# Patient Record
Sex: Female | Born: 1945 | Race: White | Hispanic: No | Marital: Married | Smoking: Former smoker
Health system: Southern US, Community
[De-identification: ages and names within clinical notes are randomized; demographics above are authoritative.]

## PROBLEM LIST (undated history)

## (undated) DIAGNOSIS — D649 Anemia, unspecified: Secondary | ICD-10-CM

## (undated) DIAGNOSIS — K219 Gastro-esophageal reflux disease without esophagitis: Secondary | ICD-10-CM

## (undated) DIAGNOSIS — I10 Essential (primary) hypertension: Secondary | ICD-10-CM

## (undated) DIAGNOSIS — F329 Major depressive disorder, single episode, unspecified: Secondary | ICD-10-CM

## (undated) DIAGNOSIS — M8580 Other specified disorders of bone density and structure, unspecified site: Secondary | ICD-10-CM

## (undated) DIAGNOSIS — E785 Hyperlipidemia, unspecified: Secondary | ICD-10-CM

## (undated) DIAGNOSIS — K449 Diaphragmatic hernia without obstruction or gangrene: Secondary | ICD-10-CM

## (undated) DIAGNOSIS — I2510 Atherosclerotic heart disease of native coronary artery without angina pectoris: Secondary | ICD-10-CM

## (undated) DIAGNOSIS — Q513 Bicornate uterus: Secondary | ICD-10-CM

## (undated) DIAGNOSIS — Z8719 Personal history of other diseases of the digestive system: Secondary | ICD-10-CM

## (undated) DIAGNOSIS — J342 Deviated nasal septum: Secondary | ICD-10-CM

## (undated) DIAGNOSIS — I341 Nonrheumatic mitral (valve) prolapse: Secondary | ICD-10-CM

## (undated) DIAGNOSIS — M1990 Unspecified osteoarthritis, unspecified site: Secondary | ICD-10-CM

## (undated) DIAGNOSIS — B029 Zoster without complications: Secondary | ICD-10-CM

## (undated) DIAGNOSIS — D219 Benign neoplasm of connective and other soft tissue, unspecified: Secondary | ICD-10-CM

## (undated) DIAGNOSIS — K635 Polyp of colon: Secondary | ICD-10-CM

## (undated) DIAGNOSIS — H33319 Horseshoe tear of retina without detachment, unspecified eye: Secondary | ICD-10-CM

## (undated) DIAGNOSIS — F32A Depression, unspecified: Secondary | ICD-10-CM

## (undated) DIAGNOSIS — IMO0002 Reserved for concepts with insufficient information to code with codable children: Secondary | ICD-10-CM

## (undated) DIAGNOSIS — R7303 Prediabetes: Secondary | ICD-10-CM

## (undated) DIAGNOSIS — F419 Anxiety disorder, unspecified: Secondary | ICD-10-CM

## (undated) DIAGNOSIS — G4730 Sleep apnea, unspecified: Secondary | ICD-10-CM

## (undated) HISTORY — DX: Unspecified osteoarthritis, unspecified site: M19.90

## (undated) HISTORY — DX: Major depressive disorder, single episode, unspecified: F32.9

## (undated) HISTORY — PX: COLONOSCOPY: SHX174

## (undated) HISTORY — DX: Diaphragmatic hernia without obstruction or gangrene: K44.9

## (undated) HISTORY — DX: Deviated nasal septum: J34.2

## (undated) HISTORY — PX: CATARACT EXTRACTION: SUR2

## (undated) HISTORY — PX: FOOT NEUROMA SURGERY: SHX646

## (undated) HISTORY — PX: OTHER SURGICAL HISTORY: SHX169

## (undated) HISTORY — PX: TUBAL LIGATION: SHX77

## (undated) HISTORY — DX: Bicornate uterus: Q51.3

## (undated) HISTORY — PX: LUMBAR FUSION: SHX111

## (undated) HISTORY — PX: FINGER SURGERY: SHX640

## (undated) HISTORY — DX: Anxiety disorder, unspecified: F41.9

## (undated) HISTORY — PX: UPPER GASTROINTESTINAL ENDOSCOPY: SHX188

## (undated) HISTORY — DX: Essential (primary) hypertension: I10

## (undated) HISTORY — DX: Depression, unspecified: F32.A

## (undated) HISTORY — DX: Nonrheumatic mitral (valve) prolapse: I34.1

## (undated) HISTORY — DX: Polyp of colon: K63.5

## (undated) HISTORY — DX: Zoster without complications: B02.9

## (undated) HISTORY — DX: Anemia, unspecified: D64.9

## (undated) HISTORY — DX: Hyperlipidemia, unspecified: E78.5

## (undated) HISTORY — PX: HYSTEROSCOPY: SHX211

## (undated) HISTORY — DX: Prediabetes: R73.03

## (undated) HISTORY — DX: Horseshoe tear of retina without detachment, unspecified eye: H33.319

## (undated) HISTORY — DX: Benign neoplasm of connective and other soft tissue, unspecified: D21.9

## (undated) HISTORY — DX: Gastro-esophageal reflux disease without esophagitis: K21.9

## (undated) HISTORY — DX: Other specified disorders of bone density and structure, unspecified site: M85.80

## (undated) HISTORY — DX: Reserved for concepts with insufficient information to code with codable children: IMO0002

## (undated) HISTORY — PX: LAPAROSCOPIC HYSTERECTOMY: SHX1926

---

## 1979-05-25 DIAGNOSIS — B029 Zoster without complications: Secondary | ICD-10-CM

## 1979-05-25 HISTORY — DX: Zoster without complications: B02.9

## 1998-08-03 ENCOUNTER — Other Ambulatory Visit: Admission: RE | Admit: 1998-08-03 | Discharge: 1998-08-03 | Payer: Self-pay | Admitting: *Deleted

## 1999-07-23 ENCOUNTER — Other Ambulatory Visit: Admission: RE | Admit: 1999-07-23 | Discharge: 1999-07-23 | Payer: Self-pay | Admitting: *Deleted

## 1999-09-11 ENCOUNTER — Other Ambulatory Visit: Admission: RE | Admit: 1999-09-11 | Discharge: 1999-09-11 | Payer: Self-pay | Admitting: Urology

## 2000-10-14 ENCOUNTER — Other Ambulatory Visit: Admission: RE | Admit: 2000-10-14 | Discharge: 2000-10-14 | Payer: Self-pay | Admitting: *Deleted

## 2001-02-12 ENCOUNTER — Other Ambulatory Visit: Admission: RE | Admit: 2001-02-12 | Discharge: 2001-02-12 | Payer: Self-pay | Admitting: Internal Medicine

## 2002-02-17 ENCOUNTER — Ambulatory Visit (HOSPITAL_COMMUNITY): Admission: RE | Admit: 2002-02-17 | Discharge: 2002-02-17 | Payer: Self-pay | Admitting: Internal Medicine

## 2002-07-21 ENCOUNTER — Other Ambulatory Visit: Admission: RE | Admit: 2002-07-21 | Discharge: 2002-07-21 | Payer: Self-pay | Admitting: Obstetrics and Gynecology

## 2003-07-27 ENCOUNTER — Other Ambulatory Visit: Admission: RE | Admit: 2003-07-27 | Discharge: 2003-07-27 | Payer: Self-pay | Admitting: Obstetrics and Gynecology

## 2003-09-22 ENCOUNTER — Inpatient Hospital Stay (HOSPITAL_COMMUNITY): Admission: RE | Admit: 2003-09-22 | Discharge: 2003-09-25 | Payer: Self-pay | Admitting: Neurosurgery

## 2003-09-24 DIAGNOSIS — H33319 Horseshoe tear of retina without detachment, unspecified eye: Secondary | ICD-10-CM

## 2003-09-24 HISTORY — DX: Horseshoe tear of retina without detachment, unspecified eye: H33.319

## 2004-02-22 ENCOUNTER — Encounter: Admission: RE | Admit: 2004-02-22 | Discharge: 2004-02-22 | Payer: Self-pay | Admitting: Internal Medicine

## 2004-10-17 ENCOUNTER — Other Ambulatory Visit: Admission: RE | Admit: 2004-10-17 | Discharge: 2004-10-17 | Payer: Self-pay | Admitting: Obstetrics and Gynecology

## 2007-02-10 ENCOUNTER — Encounter: Payer: Self-pay | Admitting: Obstetrics and Gynecology

## 2007-02-10 ENCOUNTER — Ambulatory Visit (HOSPITAL_BASED_OUTPATIENT_CLINIC_OR_DEPARTMENT_OTHER): Admission: RE | Admit: 2007-02-10 | Discharge: 2007-02-10 | Payer: Self-pay | Admitting: Obstetrics and Gynecology

## 2007-06-22 ENCOUNTER — Other Ambulatory Visit: Admission: RE | Admit: 2007-06-22 | Discharge: 2007-06-22 | Payer: Self-pay | Admitting: Gynecology

## 2007-12-23 ENCOUNTER — Ambulatory Visit: Payer: Self-pay | Admitting: Internal Medicine

## 2007-12-31 ENCOUNTER — Encounter: Payer: Self-pay | Admitting: Internal Medicine

## 2007-12-31 ENCOUNTER — Ambulatory Visit: Payer: Self-pay | Admitting: Internal Medicine

## 2008-08-16 ENCOUNTER — Other Ambulatory Visit: Admission: RE | Admit: 2008-08-16 | Discharge: 2008-08-16 | Payer: Self-pay | Admitting: Obstetrics and Gynecology

## 2008-08-24 ENCOUNTER — Emergency Department (HOSPITAL_COMMUNITY): Admission: EM | Admit: 2008-08-24 | Discharge: 2008-08-24 | Payer: Self-pay | Admitting: Emergency Medicine

## 2008-11-23 ENCOUNTER — Encounter: Admission: RE | Admit: 2008-11-23 | Discharge: 2008-11-23 | Payer: Self-pay | Admitting: Neurosurgery

## 2009-02-22 ENCOUNTER — Encounter (INDEPENDENT_AMBULATORY_CARE_PROVIDER_SITE_OTHER): Payer: Self-pay | Admitting: *Deleted

## 2009-04-11 ENCOUNTER — Encounter: Payer: Self-pay | Admitting: Obstetrics and Gynecology

## 2009-04-11 ENCOUNTER — Ambulatory Visit (HOSPITAL_COMMUNITY): Admission: RE | Admit: 2009-04-11 | Discharge: 2009-04-12 | Payer: Self-pay | Admitting: Obstetrics and Gynecology

## 2009-07-18 ENCOUNTER — Encounter: Admission: RE | Admit: 2009-07-18 | Discharge: 2009-07-18 | Payer: Self-pay | Admitting: Internal Medicine

## 2009-08-21 ENCOUNTER — Encounter (INDEPENDENT_AMBULATORY_CARE_PROVIDER_SITE_OTHER): Payer: Self-pay | Admitting: *Deleted

## 2009-08-30 ENCOUNTER — Ambulatory Visit: Payer: Self-pay | Admitting: Internal Medicine

## 2009-09-05 ENCOUNTER — Ambulatory Visit: Payer: Self-pay | Admitting: Internal Medicine

## 2009-09-06 ENCOUNTER — Encounter: Payer: Self-pay | Admitting: Internal Medicine

## 2009-11-30 DIAGNOSIS — F32A Depression, unspecified: Secondary | ICD-10-CM | POA: Insufficient documentation

## 2009-11-30 DIAGNOSIS — J45909 Unspecified asthma, uncomplicated: Secondary | ICD-10-CM | POA: Insufficient documentation

## 2009-11-30 DIAGNOSIS — R011 Cardiac murmur, unspecified: Secondary | ICD-10-CM | POA: Insufficient documentation

## 2009-11-30 DIAGNOSIS — J302 Other seasonal allergic rhinitis: Secondary | ICD-10-CM | POA: Insufficient documentation

## 2009-11-30 DIAGNOSIS — IMO0002 Reserved for concepts with insufficient information to code with codable children: Secondary | ICD-10-CM | POA: Insufficient documentation

## 2009-11-30 DIAGNOSIS — R7301 Impaired fasting glucose: Secondary | ICD-10-CM | POA: Insufficient documentation

## 2009-11-30 DIAGNOSIS — I10 Essential (primary) hypertension: Secondary | ICD-10-CM | POA: Insufficient documentation

## 2009-11-30 DIAGNOSIS — Z008 Encounter for other general examination: Secondary | ICD-10-CM | POA: Insufficient documentation

## 2009-11-30 DIAGNOSIS — K219 Gastro-esophageal reflux disease without esophagitis: Secondary | ICD-10-CM | POA: Insufficient documentation

## 2010-10-14 ENCOUNTER — Encounter: Payer: Self-pay | Admitting: Internal Medicine

## 2010-10-23 NOTE — Letter (Signed)
Summary: [facility] Instructions  [facility]  [address] [place], [place] [address]   Phone: [phone]  Fax: [phone]       Shelley Spencer    30-Mar-1946    MRN: [id number]       Procedure Day /Date: Tuesday 09/05/09     Arrival Time:  9:30 AM     Procedure Time: 10:30 AM     Location of Procedure:                    _X _  [facility] (4th Floor)  PREPARATION FOR COLONOSCOPY WITH MIRALAX  Starting 5 days prior to your procedure Thursday 08/31/09 do not eat nuts, seeds, popcorn, corn, beans, peas,  salads, or any raw vegetables.  Do not take any fiber supplements (e.g. Metamucil, Citrucel, and Benefiber). ____________________________________________________________________________________________________   THE DAY BEFORE YOUR PROCEDURE         DATE: 09/04/09  DAY:  Monday  1   Drink clear liquids the entire day-NO SOLID FOOD  2   Do not drink anything colored red or purple.  Avoid juices with pulp.  No orange juice.  3   Drink at least 64 oz. (8 glasses) of fluid/clear liquids during the day to prevent dehydration and help the prep work efficiently.  CLEAR LIQUIDS INCLUDE: Water Jello Ice Popsicles Tea (sugar ok, no milk/cream) Powdered fruit flavored drinks Coffee (sugar ok, no milk/cream) Gatorade Juice: apple, white grape, white cranberry  Lemonade Clear bullion, consomm, broth Carbonated beverages (any kind) Strained chicken noodle soup Hard Candy  4   Mix the entire bottle of Miralax with 64 oz. of Gatorade/Powerade in the morning and put in the refrigerator to chill.  5   At 3:00 pm take 2 Dulcolax/Bisacodyl tablets.  6   At 4:30 pm take one Reglan/Metoclopramide tablet.  7  Starting at 5:00 pm drink one 8 oz glass of the Miralax mixture every 15-20 minutes until you have finished drinking the entire 64 oz.  You should finish drinking prep around 7:30 or 8:00 pm.  8   If you are nauseated, you may take the 2nd  Reglan/Metoclopramide tablet at 6:30 pm.        9    At 8:00 pm take 2 more DULCOLAX/Bisacodyl tablets.     THE DAY OF YOUR PROCEDURE      DATE:  09/05/09  DAY: Tuesday  You may drink clear liquids until  8:30 AM  (2 HOURS BEFORE PROCEDURE).   MEDICATION INSTRUCTIONS  Unless otherwise instructed, you should take regular prescription medications with a small sip of water as early as possible the morning of your procedure.  Additional medication instructions: Hold Furosemide morning of procedure         OTHER INSTRUCTIONS  You will need a responsible adult at least 63 years of age to accompany you and drive you home.   This person must remain in the waiting room during your procedure.  Wear loose fitting clothing that is easily removed.  Leave jewelry and other valuables at home.  However, you may wish to bring a book to read or an iPod/MP3 player to listen to music as you wait for your procedure to start.  Remove all body piercing jewelry and leave at home.  Total time from sign-in until discharge is approximately 2-3 hours.  You should go home directly after your procedure and rest.  You can resume normal activities the day after your procedure.  The  day of your procedure you should not:   Drive   Make legal decisions   Operate machinery   Drink alcohol   Return to work  You will receive specific instructions about eating, activities and medications before you leave.   The above instructions have been reviewed and explained to me by   Ezra Sites RN  August 30, 2009 9:04 AM     I fully understand and can verbalize these instructions _____________________________ Date _______

## 2010-10-23 NOTE — Procedures (Signed)
Summary: Colonoscopy  Patient: Shelley Spencer Note: All result statuses are Final unless otherwise noted.  Tests: (1) Colonoscopy (COL)   COL Colonoscopy           DONE      [facility]     [address]     [place], [place]  [address]           COLONOSCOPY PROCEDURE REPORT           PATIENT:  [name], [name]  MR#:  [id number]     BIRTHDATE:  Jan 06, 1946, 63 yrs. old  GENDER:  female           ENDOSCOPIST:  Hedwig Morton. Juanda Chance, MD     Referred by:  [name], M.D.           PROCEDURE DATE:  09/05/2009     PROCEDURE:  Colonoscopy 32440     ASA CLASS:  Class I     INDICATIONS:  history of pre-cancerous (adenomatous) colon polyps,     history of hyperplastic polyps adenom. polyp in 2002,     hyperplastic polyp in 2005           MEDICATIONS:   Versed 7 mg, Fentanyl 62.5 mcg           DESCRIPTION OF PROCEDURE:   After the risks benefits and     alternatives of the procedure were thoroughly explained, informed     consent was obtained.  Digital rectal exam was performed and     revealed no abnormalities.   The LB [id number]-H180AL [id number] endoscope     was introduced through the anus and advanced to the cecum, which     was identified by both the appendix and ileocecal valve, without     limitations.  The quality of the prep was good, using MiraLax.     The instrument was then slowly withdrawn as the colon was fully     examined.     <<PROCEDUREIMAGES>>           FINDINGS:  A diminutive polyp was found in the descending colon.     2-3 mm polyps at 50 cm removed The polyp was removed using cold     biopsy forceps (see image4).  This was otherwise a normal     examination of the colon (see image5, image3, image2, and image1).     Retroflexion was not performed.  The scope was then withdrawn from     the patient and the procedure completed.           COMPLICATIONS:  None           ENDOSCOPIC IMPRESSION:     1) Diminutive polyp in the descending colon     2) Otherwise normal  examination     RECOMMENDATIONS:     1) Await pathology results     2) high fiber diet           REPEAT EXAM:  In 7 year(s) for.           ______________________________     Hedwig Morton. Juanda Chance, MD           CC:           n.     eSIGNED:   Hedwig Morton. Kingslee Mairena at 09/05/2009 11:02 AM           Elinor Dodge, [id number]  Note: An exclamation mark (!) indicates a result that was not dispersed into the flowsheet. Document Creation  Date: 09/05/2009 11:02 AM _______________________________________________________________________  (1) Order result status: Final Collection or observation date-time: 09/05/2009 10:54 Requested date-time:  Receipt date-time:  Reported date-time:  Referring Physician:   Ordering Physician: Lina Sar [phone]) Specimen Source:  Source: Launa Grill Order Number: [id number] Lab site:   Appended Document: Colonoscopy     Procedures Next Due Date:    Colonoscopy: 08/2016

## 2010-10-23 NOTE — Letter (Signed)
Summary: Recall Colonoscopy Letter  [facility]  [address] [place], [place] [address]   Phone: [phone]  Fax: [phone]      February 22, 2009 MRN: [id number]   [facility] [address] [place], [place]  [address]   Dear Shelley Spencer,   According to your medical record, it is time for you to schedule a Colonoscopy. The American Cancer Society recommends this procedure as a method to detect early colon cancer. Patients with a family history of colon cancer, or a personal history of colon polyps or inflammatory bowel disease are at increased risk.  This letter has beeen generated based on the recommendations made at the time of your procedure. If you feel that in your particular situation this may no longer apply, please contact our office.  Please call our office at [phone] to schedule this appointment or to update your records at your earliest convenience.  Thank you for cooperating with [place] to provide you with the very best care possible.   Sincerely,  Hedwig Morton. Juanda Chance, M.D.  [facility] [phone]

## 2010-10-23 NOTE — Letter (Signed)
Summary: Patient Notice- Polyp Results  [facility]  [address] [place], [place] [address]   Phone: [phone]  Fax: [phone]        September 06, 2009 MRN: [id number]    [facility] [address] [place], [place]  [address]    Dear [name],  I am pleased to inform you that the colon polyp(s) removed during your recent colonoscopy was (were) found to be benign (no cancer detected) upon pathologic examination.The polyp consisted of a normal ( not precancerous) tissue  I recommend you have a repeat colonoscopy examination in 7_ years to look for recurrent polyps, as having colon polyps increases your risk for having recurrent polyps or even colon cancer in the future.  Should you develop new or worsening symptoms of abdominal pain, bowel habit changes or bleeding from the rectum or bowels, please schedule an evaluation with either your primary care physician or with me.  Additional information/recommendations:  _x_ No further action with gastroenterology is needed at this time. Please      follow-up with your primary care physician for your other healthcare      needs.  __ Please call [phone] to schedule a return visit to review your      situation.  __ Please keep your follow-up visit as already scheduled.  __ Continue treatment plan as outlined the day of your exam.  Please call us if you are having persistent problems or have questions about your condition that have not been fully answered at this time.  Sincerely,  Hart Carwin MD  This letter has been electronically signed by your physician.  Appended Document: Patient Notice- Polyp Results letter mailed 12.17.10

## 2010-10-23 NOTE — Miscellaneous (Signed)
Summary: [facility]  Clinical Lists Changes  Medications: Added new medication of MIRALAX   POWD (POLYETHYLENE GLYCOL 3350) As per prep  instructions. - Signed Added new medication of DULCOLAX 5 MG  TBEC (BISACODYL) Day before procedure take 2 at 3pm and 2 at 8pm. - Signed Added new medication of REGLAN 10 MG  TABS (METOCLOPRAMIDE HCL) As per prep instructions. - Signed Rx of MIRALAX   POWD (POLYETHYLENE GLYCOL 3350) As per prep  instructions.;  #255gm x 0;  Signed;  Entered by: Ezra Sites RN;  Authorized by: Hart Carwin MD;  Method used: Electronically to [facility] [address] [place], [place]  [address], Ph: [phone] or [phone], Fax: [phone] Rx of DULCOLAX 5 MG  TBEC (BISACODYL) Day before procedure take 2 at 3pm and 2 at 8pm.;  #4 x 0;  Signed;  Entered by: Ezra Sites RN;  Authorized by: Hart Carwin MD;  Method used: Electronically to [facility] [address] [place], [place]  [address], Ph: [phone] or [phone], Fax: [phone] Rx of REGLAN 10 MG  TABS (METOCLOPRAMIDE HCL) As per prep instructions.;  #2 x 0;  Signed;  Entered by: Ezra Sites RN;  Authorized by: Hart Carwin MD;  Method used: Electronically to [facility] [address], [place], [place]  [address], Ph: [phone] or [phone], Fax: [phone] Observations: Added new observation of NKA: T (08/30/2009 8:28)    Prescriptions: REGLAN 10 MG  TABS (METOCLOPRAMIDE HCL) As per prep instructions.  #2 x 0   Entered by:   Ezra Sites RN   Authorized by:   Hart Carwin MD   Signed by:   Ezra Sites RN on 08/30/2009   Method used:   Electronically to        [facility] (retail)       [address]       [place], [place]  [address]       Ph: [phone] or [phone]       Fax: [phone]   RxID:   [id number] DULCOLAX 5 MG  TBEC (BISACODYL) Day before procedure take 2 at 3pm and 2 at 8pm.  #4 x 0   Entered by:   Ezra Sites RN   Authorized by:   Hart Carwin MD   Signed  by:   Ezra Sites RN on 08/30/2009   Method used:   Electronically to        [facility] (retail)       [address]       [place], [place]  [address]       Ph: [phone] or [phone]       Fax: [phone]   RxID:   [id number] MIRALAX   POWD (POLYETHYLENE GLYCOL 3350) As per prep  instructions.  #255gm x 0   Entered by:   Ezra Sites RN   Authorized by:   Hart Carwin MD   Signed by:   Ezra Sites RN on 08/30/2009   Method used:   Electronically to        [facility] (retail)       [address]       [place], [place]  [address]       Ph: [phone] or [phone]       Fax: [phone]   RxID:   [id number]

## 2010-12-30 LAB — CBC
HCT: 34.8 % — ABNORMAL LOW (ref 36.0–46.0)
HCT: 38.9 % (ref 36.0–46.0)
Hemoglobin: 11.3 g/dL — ABNORMAL LOW (ref 12.0–15.0)
Hemoglobin: 12.8 g/dL (ref 12.0–15.0)
MCHC: 32.6 g/dL (ref 30.0–36.0)
MCHC: 32.9 g/dL (ref 30.0–36.0)
MCV: 80.6 fL (ref 78.0–100.0)
MCV: 81.6 fL (ref 78.0–100.0)
Platelets: 257 10*3/uL (ref 150–400)
Platelets: 345 10*3/uL (ref 150–400)
RBC: 4.26 MIL/uL (ref 3.87–5.11)
RBC: 4.82 MIL/uL (ref 3.87–5.11)
RDW: 14.1 % (ref 11.5–15.5)
RDW: 14.3 % (ref 11.5–15.5)
WBC: 16.5 10*3/uL — ABNORMAL HIGH (ref 4.0–10.5)
WBC: 9.6 10*3/uL (ref 4.0–10.5)

## 2010-12-30 LAB — COMPREHENSIVE METABOLIC PANEL
ALT: 22 U/L (ref 0–35)
AST: 17 U/L (ref 0–37)
Albumin: 3.3 g/dL — ABNORMAL LOW (ref 3.5–5.2)
Alkaline Phosphatase: 123 U/L — ABNORMAL HIGH (ref 39–117)
BUN: 8 mg/dL (ref 6–23)
CO2: 28 mEq/L (ref 19–32)
Calcium: 8.9 mg/dL (ref 8.4–10.5)
Chloride: 106 mEq/L (ref 96–112)
Creatinine, Ser: 0.82 mg/dL (ref 0.4–1.2)
GFR calc Af Amer: >60 mL/min (ref >60)
GFR calc non Af Amer: >60 mL/min (ref >60)
Glucose, Bld: 109 mg/dL — ABNORMAL HIGH (ref 70–99)
Potassium: 3.6 mEq/L (ref 3.5–5.1)
Sodium: 140 mEq/L (ref 135–145)
Total Bilirubin: 0.5 mg/dL (ref 0.3–1.2)
Total Protein: 6.9 g/dL (ref 6.0–8.3)

## 2010-12-30 LAB — TYPE AND SCREEN
ABO/RH(D): B POS
Antibody Screen: NEGATIVE

## 2010-12-30 LAB — ABO/RH: ABO/RH(D): B POS

## 2011-02-05 ENCOUNTER — Other Ambulatory Visit: Payer: Self-pay | Admitting: Dermatology

## 2011-02-05 NOTE — Assessment & Plan Note (Signed)
[facility]                         GASTROENTEROLOGY OFFICE NOTE   Shelley, Spencer                        MRN:          [id number]  DATE:12/23/2007                            DOB:          1946/06/23    OFFICE CONSULTATION   PROBLEM:  1. Heartburn, indigestion.  2. Hematemesis of a quarter-sized clot.   HISTORY:  Shelley Spencer is a pleasant, 65 year old, white female, who had  undergone two previous colonoscopies with Dr. Juanda Chance for colon polyps,  the last was done approximately three years ago and was positive for one  small polyp in the rectum, which was hyperplastic.  She has not had any  previous upper GI evaluation or endoscopy.  She says that she has been  on Protonix chronically over the past couple of years per Dr. Waynard Edwards and  that this had worked well for the most part.  She says occasionally she  would have some heartburn but felt that was due to dietary indiscretion.  Approximately one week ago, she ate a donut and then developed bad  indigestion shortly thereafter.  She says he was driving in her car and  burped and actually burped up a blood clot about the size of a quarter.  Since that time, she has had some mid chest discomfort that has been  fairly constant and radiating to her back.  She says initially she was  somewhat afraid to eat and that her appetite has been somewhat off over  the past week.  She has not had any nausea or vomiting, denies any  dysphagia or odynophagia, but says she has a fairly high tolerance to  pain.  She has not had any further episodes of the hematemesis, has  noticed that her stools maybe are a little bit darker, but no overt  blood or melena and denies any abdominal pain.  She had taken a course  of Augmentin about three weeks ago and had also just prior to that  episode taken about a two week course of Celebrex for arthritis  symptoms.  She saw Dr. Waynard Edwards, the Protonix was increased to twice a  day, and  she is referred here.   CURRENT MEDICATIONS:  1. Furosemide 20 mg daily.  2. KCL 20 mEq daily.  3. Trazodone 150 q.h.s.  4. Paxil 40 daily.  5. Pravachol 40 daily.  6. Protonix 40 b.i.d.  7. Advair; she is not certain of the milligrams twice daily.  8. Flonase nasal spray.  9. Zyrtec 10 mg daily.  10.Avapro 150 daily.  11.Vivelle patch two times weekly.  12.Prometrium 100 mg daily.   ALLERGIES:  No known drug allergies.  INTOLERANT TO ERYTHROMYCIN and  AUGMENTIN with GI upset.   PAST MEDICAL HISTORY:  1. Bilateral tubal ligation.  2. Hyperlipidemia.  3. Hypertension.  4. Anxiety.  5. Depression.  6. Chronic allergy sinus symptoms.   FAMILY HISTORY:  Parents and brother with heart disease, parents and  brothers also with a history of alcoholism, there is no family history  of colon cancer or polyps.   SOCIAL HISTORY:  The patient is married,  she is a Runner, broadcasting/film/video, she is a  nonsmoker, drinks alcohol occasionally socially.   REVIEW OF SYSTEMS:  Pertinent for intermittent lower extremity edema,  arthritic symptoms, fatigue, occasional shortness of breath with  exertion, she is still having menstrual periods, says that they have  been a bit heavier over the past few months, and generally last four to  five days.   PHYSICAL EXAMINATION:  GENERAL:  A well-developed white female in no  acute distress.  VITAL SIGNS:  Height is 5 foot 2, weight is 190.8, blood pressure  106/68, pulse is 86.  HEENT:  Nontraumatic, normocephalic, EOMI, PERRLA, sclerae are  anicteric.  Examination of the oropharynx is negative.  NECK:  Supple and without nodes.  CARDIOVASCULAR:  Regular rate and rhythm with S1 and S2, no murmur, rub,  or gallop.  PULMONARY:  Clear to A&P.  She has no chest wall tenderness.  ABDOMEN:  Soft and nontender, there is no palpable mass or  hepatosplenomegaly.  RECTAL EXAM:  Brown stool that was trace hemoccult positive.   LABORATORY STUDIES:  CBC done in Dr. Laurey Morale  office shows a hemoglobin  of 12.1, hematocrit of 35.4, MCV of 78.3, WBC of 7.9, and platelets 275.   IMPRESSION:  70. A 64 year old, white female with one week history of subxiphoid      pain and a single episode of hematemesis.  Symptoms are most      consistent with an acute esophagitis, the etiology of which is      unclear with no obvious risk factors, rule out nonsteroidal      antiinflammatory drug-induced (NSAID) esophageal ulceration or      peptic ulcer disease.  2. History of colon polyps.  3. Hypertension.   PLAN:  1. Continue Protonix 40 mg p.o. b.i.d.  2. Add Carafate Slurry 1 g a.c., in between meals, and at h.s.  3. Stop Celebrex.  4. Schedule an upper endoscopy as soon as possible with Dr. Juanda Chance.  5. Check a CBC and iron studies today.      Shelley Gip, PA-C  Electronically Signed      Shelley Spencer. Shelley Goodell, MD  Electronically Signed   AE/MedQ  DD: 12/23/2007  DT: 12/23/2007  Job #: [id number]   cc:   Hedwig Morton. Juanda Chance, MD  Redge Gainer Perini, M.D.

## 2011-02-05 NOTE — Op Note (Signed)
Shelley Spencer, Shelley Spencer                 ACCOUNT NO.:  [id number]   MEDICAL RECORD NO.:  [id number]          PATIENT TYPE:  AMB   LOCATION:  DAY                          FACILITY:  [facility]   PHYSICIAN:  Cynthia P. Romine, M.D.DATE OF BIRTH:  September 17, 1946   DATE OF PROCEDURE:  04/11/2009  DATE OF DISCHARGE:                               OPERATIVE REPORT   PREOPERATIVE DIAGNOSIS:  Back and leg pain, multiple uterine fibroids.   POSTOPERATIVE DIAGNOSIS:  Back and leg pain, multiple uterine fibroids.   PATHOLOGY:  Pending.   PROCEDURE:  Robotic total laparoscopic hysterectomy with bilateral  salpingo-oophorectomy.   SURGEON:  Cynthia P. Romine, M.D.   ASSISTANT:  Dr. Leda Quail.   ANESTHESIA:  General endotracheal.   ESTIMATED BLOOD LOSS:  100 mL.   COMPLICATIONS:  None.   PROCEDURE:  The patient was taken to the operating room and after the  induction of adequate general endotracheal anesthesia, was placed in the  dorsal lithotomy position and prepped and draped in the usual fashion.  A posterior weighted and anterior Sims retractor were placed.  The  cervix was grasped on its anterior lip with a single-tooth tenaculum.  Double cervical ostia could be seen.  The one on the patient's right was  very stenotic.  The one on the patient's left was more patent and did  dilate up to a #23 Pratt.  The sound was inserted and sounded to 11 cm.  A 10-cm RUMI manipulator with a large KOH ring was placed around the  cervix.  The balloon was inflated in the uterus to 5 mL.  The vaginal  occluder was inflated to 50 mL.  A Foley catheter was inserted for clear  yellow urine.  The patient has a history of asthma and preoperatively  had been having some difficulties with her breathing.  She was given a  preoperative respiratory treatment and during Trendelenburg, she was  found to be quite intolerant of the Trendelenburg from a respiratory  standpoint.  Therefore, she was put in less steep  Trendelenburg than is  typical for a robotic hysterectomy.  The supraumbilical incision was  made after infiltrating with 0.25% Marcaine, carried down to the fascia  with blunt dissection.  The fascia was elevated with Kochers, opened  with scissors, and the underlying peritoneum was entered atraumatically.  A pursestring suture was placed around the fascia.  A Hasson trocar was  inserted.  Pneumoperitoneum was created and the pelvis was inspected.  The 2 uterine horns were obvious and were fused in the midline  partially.  Externally the uterus looked like a deep arcuate pattern.  The tubes and ovaries on each side appeared normal, although the tubes  were both slightly thickened and edematous-looking.  They were mobile,  as were the ovaries.  After establishing pneumoperitoneum, the abdomen  was insufflated.  The sites for the accessory trocars were marked with a  marking pen and infiltrated with 0.25% Marcaine and two 8-mm trocars  were placed in the patient's left upper quadrant and in the right upper  quadrant an 8-mm and a 10-mm  trocar were placed under direct  visualization.  The patient was put in as much Trendelenburg as she  would tolerate.  The robot was brought in and docked from the side to  allow vaginal access later in the case.  A PK Gyrus and monopolar  scissors and a cobra grasper were used.  The surgeon then proceeded to  the robot.  The ureter on the right was identified and could be seen  peristalsing.  On the left, an attempt was made to find the ureter and  there was significant fat underneath the peritoneum and some bowel  adhesions and the ureter could not be identified on the patient's left.  On the right, the round ligament was coagulated with the Gyrus and then  cut.  The infundibulopelvic ligament was cauterized, hugging the ovary  closely to stay as far away from the ureter as possible.  The  infundibulopelvic ligament was cauterized and then cut sequentially  with  scissors.  The anterior and posterior leaves of the broad ligament were  taken down sharply.  On the opposite side, the round ligament was  coagulated and cut.  The infundibulopelvic ligament, again, was  cauterized, hugging the ovary carefully to stay as far away from the  presumed position of the ureter as possible.  The anterior and posterior  leaves of the broad ligament were opened, paying particular attention to  open the posterior leaf of the broad ligament widely throughout ureter  laterally.  The bladder was taken down off the cervix using sharp and  blunt dissection.  The uterine arteries on each side were identified,  cauterized and cut.  The KOH ring was elevated vaginally as much as  possible when doing this.  After cutting the uterine arteries, the  vagina was then entered anteriorly with the monopolar scissors and an  incision was made into the vagina circumferentially around the KOH ring  with the monopolar scissors.  The specimen was able to be delivered  vaginally without morcellation.  The vaginal occluder was placed in the  vagina to maintain the pneumoperitoneum.  The cuff was then closed with  4 sutures of figure-of-eight 0 Vicryl.  The pelvis was inspected.  The  pedicles were free of bleeding, as was the cuff.  It should be noted  that after completely excising the specimen from the vagina, it was  noted that one of the blades of the monopolar scissors had broken off  the instrument.  The instrument was taken out and switched out.  The  blade that had been broken off was found after the specimen was removed.  It was lying on the posterior vaginal cuff.  This was easily grasped and  brought out through the assistance port.  After closure of the vagina  and irrigation, the right ureter was again seen peristalsing.  The left  ureter, again, could not be identified.  The pelvis was hemostatic.  The  instruments were removed under direct visualization after the  robot was  undocked.  Pneumoperitoneum was allowed to escape.  The sleeves were  removed.  The fascial stitch at the supraumbilical incision was tied.  The fascia at the assistance port was grasped with a Kocher and closed  with a single suture of 0 Vicryl.  The skin and all the incisions were  closed subcuticularly with 4-0 Vicryl.  Benzoin and Steri-Strips and a  dressing were applied.  The procedure was terminated.  The patient  tolerated it well and went in  satisfactory condition to post anesthesia  recovery.      Cynthia P. Romine, M.D.  Electronically Signed     CPR/MEDQ  D:  04/11/2009  T:  04/12/2009  Job:  [id number]

## 2011-02-08 NOTE — Op Note (Signed)
NAMESALVATORE, [name]                 ACCOUNT NO.:  [id number]   MEDICAL RECORD NO.:  [id number]          PATIENT TYPE:  AMB   LOCATION:  [facility]                         FACILITY:  [facility]   PHYSICIAN:  Cynthia P. 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  The polyp  and the myoma were seen on the right-hand uterus on sonohysterogram.   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  The polyp  and the myoma were seen on the right-hand uterus on sonohysterogram,  pathology pending.   PROCEDURE:  1. Hysteroscopy and hysteroscopic resection of polyps and myoma in the      right uterine corpus.  2. Hysteroscopy and removal of polyp from left corpus.  3. Dilatation and curettage of both sides.   SURGEON:  Meredeth Ide, M.D.   ANESTHESIA:  General by LMA.   ESTIMATED BLOOD LOSS:  Minimal.   COMPLICATIONS:  None.   PROCEDURE:  The patient was taken to the operating room and after the  induction of adequate general anesthesia, she was placed in the dorsal  lithotomy position and prepped and draped in the usual fashion.  The  bladder was drained with an in-and-out catheterization.  A posterior  weighted and anterior Sims retractor were placed.  The cervix was  grasped on its anterior lip with a single-tooth tenaculum.  There were 2  openings noted, right and left; the right one was more prominent; it was  dilated to a #31 Pratt and the operative hysteroscope was introduced.  Several endometrial polyps and a myoma were noted and these were  resected with the resectoscope.  Drawing back with the resectoscope, an  attempt was made to see if there was a connection in the lower  part of  the cervix from one cervix to the other, but there was not.  The second  os the left was then also dilated to a #31 Pratt and the hysteroscope  introduced.  Hysteroscopy revealed it to be generally a clean cavity;  there was 1 small polyp that was removed with the hysteroscope was a  single loop and then the hysteroscope was withdrawn from that side as  well.  Sharp curettage was carried out on the right first and then was  carried out on the left.  After a curettage was done on the right, a  Pratt dilator was placed into the cavity to be certain that when the  operator curetted the left cavity that it was indeed separate and  indistinct from the cervix into which I had already been.  Upon  curetting the left cavity, the curette could be felt bumping up against  the dilator that was in the corpus on the right, therefore establishing  again, as was seen on sonohysterogram, a connection near the fundus  between the 2 uterine sides.  After completing the curettage  on the left, the instruments were withdrawn from the uterus, the  tenaculum was taken off the cervix, the retractors were removed and the  procedure was terminated.  The patient tolerated it well and went in  satisfactory condition to Post Anesthesia Recovery.      Cynthia P. Romine, M.D.  Electronically Signed     CPR/MEDQ  D:  02/10/2007  T:  02/10/2007  Job:  [id number]

## 2011-03-12 ENCOUNTER — Ambulatory Visit (HOSPITAL_BASED_OUTPATIENT_CLINIC_OR_DEPARTMENT_OTHER)
Admission: RE | Admit: 2011-03-12 | Discharge: 2011-03-12 | Disposition: A | Discharge status: Home / Self Care | Payer: BC Managed Care – PPO | Source: Ambulatory Visit | Attending: Orthopedic Surgery | Admitting: Orthopedic Surgery

## 2011-03-12 DIAGNOSIS — M6740 Ganglion, unspecified site: Secondary | ICD-10-CM | POA: Insufficient documentation

## 2011-03-12 DIAGNOSIS — M19049 Primary osteoarthritis, unspecified hand: Secondary | ICD-10-CM | POA: Insufficient documentation

## 2011-03-15 NOTE — Op Note (Signed)
Shelley Spencer, Shelley Spencer                ACCOUNT NO.:  [id number]  MEDICAL RECORD NO.:  [id number]  LOCATION:                                 FACILITY:  PHYSICIAN:  Shelley Fitch. Merial Moritz, M.D.      DATE OF BIRTH:  DATE OF PROCEDURE:  03/12/2011 DATE OF DISCHARGE:                              OPERATIVE REPORT   PREOPERATIVE DIAGNOSES:  Mucoid cyst, dorsal ulnar aspect right long finger distal interphalangeal joint status post 2 prior debridements, one by patient and one by her attending dermatologist with recurrence. Preoperative x-rays revealed advanced degenerative arthritis of the right long finger distal interphalangeal joint with large marginal osteophytes and probable small loose bodies.  OPERATION: 1. Debridement of mucoid cyst, right long finger. 2. Distal interphalangeal joint arthrotomy with removal of marginal     osteophytes on middle phalangeal head and base of distal phalanx     with synovectomy and irrigation.  OPERATING SURGEON:  Shelley Fitch. Jauna Raczynski, MD  ASSISTANT:  Annye Rusk, PA-C  ANESTHESIA:  Lidocaine 2% metacarpal head level block.  This was performed as a minor operating room procedure.  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 Bufford Buttner, attending dermatologist for evaluation and management of recurrent mucoid cyst.  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  She had developed a mucoid cyst on the dorsal ulnar aspect of right long finger and had tried to debride it at home without success.  She subsequently had a second debridement of biopsy by Dr. Leonie Man.  She had a recurrence and was referred for an extremity orthopedic consult.  Clinical examination revealed a mature mucoid cyst on the dorsal ulnar aspect the DIP joint.  Plain x-rays documented bone-on-bone arthropathy with large marginal osteophytes and likely  loose bodies.  We advised Shelley Spencer to undergo DIP joint arthrotomy, cyst excision, osteophyte debridement, and loose body debridement.  Questions regarding the anticipated procedure were invited and answered in detail.  PROCEDURE:  Shelley Spencer was brought to room 2 at the [facility] and placed in supine position on operating table.  After informed consent and alcohol and Betadine prep, 2% lidocaine was infiltrated in the metacarpal head level to obtain a digital block.  After few moments, excellent anesthesia was achieved.  The right hand and arm were then prepped with Betadine soap and solution, sterilely draped.  A digital tourniquet was utilized after exsanguination of the long finger with a gauze wrap.  A 1/2-inch Penrose drain was placed at the base of the proximal phalanx.  Procedure commenced with a curvilinear incision incorporating the cyst with an elliptical excision of full-thickness skin and cyst.  We then exposed the extensor mechanism and the capsule on the ulnar aspect of the DIP joint.  A triangular portion of the capsule was resected between the terminal extensor tendon slip and the ulnar collateral ligament. The marginal osteophytes at the base of the distal phalanx and middle phalangeal head were removed with a micro rongeur and a micro curette. Synovectomy of the dorsal aspect of the joint was accomplished.  The joint was then  irrigated thoroughly until the effluent was clear.  The wound was then inspected for bleeding points followed by repair of the skin with a rotation flap utilizing trauma sutures to advance the radial side skin distally to close the defect created by cyst excision. Multiple interrupted sutures of 5-0 nylon were utilized.  The wound was then dressed with Xeroflo, tourniquet released, and hemostasis achieved by direct pressure.  Finger was dressed with sterile gauze and Coban.  For aftercare, Shelley Spencer will remove her dressing in  4 days.  She is provided prescriptions for Vicodin 5 mg one p.o. q.4-6 h p.r.n. pain 20 tablets without refill, also doxycycline 100 mg 1 p.o. b.i.d. with 8 ounces of water x4 days as prophylactic antibiotic.  She is encouraged to avoid direct sunlight and/or use a hat and some screening she is in the son for any period longer than 30 minutes.     Shelley Spencer, M.D.     RVS/MEDQ  D:  03/12/2011  T:  03/13/2011  Job:  [id number]  cc:   Loraine Leriche A. Perini, M.D. Bufford Buttner, MD  Electronically Signed by Josephine Igo M.D. on 03/15/2011 08:29:55 AM

## 2011-06-28 LAB — URINALYSIS, ROUTINE W REFLEX MICROSCOPIC
Bilirubin Urine: NEGATIVE
Glucose, UA: NEGATIVE mg/dL
Ketones, ur: NEGATIVE mg/dL
Nitrite: NEGATIVE
Protein, ur: NEGATIVE mg/dL
Specific Gravity, Urine: 1.017 (ref 1.005–1.030)
Urobilinogen, UA: 1 mg/dL (ref 0.0–1.0)
pH: 6.5 (ref 5.0–8.0)

## 2011-06-28 LAB — DIFFERENTIAL
Basophils Absolute: 0.1 10*3/uL (ref 0.0–0.1)
Basophils Relative: 1 % (ref 0–1)
Eosinophils Absolute: 0.2 10*3/uL (ref 0.0–0.7)
Eosinophils Relative: 3 % (ref 0–5)
Lymphocytes Relative: 47 % — ABNORMAL HIGH (ref 12–46)
Lymphs Abs: 3.7 10*3/uL (ref 0.7–4.0)
Monocytes Absolute: 0.6 10*3/uL (ref 0.1–1.0)
Monocytes Relative: 8 % (ref 3–12)
Neutro Abs: 3.2 10*3/uL (ref 1.7–7.7)
Neutrophils Relative %: 41 % — ABNORMAL LOW (ref 43–77)

## 2011-06-28 LAB — CBC
HCT: 40.5 % (ref 36.0–46.0)
Hemoglobin: 13.3 g/dL (ref 12.0–15.0)
MCHC: 32.7 g/dL (ref 30.0–36.0)
MCV: 81.3 fL (ref 78.0–100.0)
Platelets: 304 10*3/uL (ref 150–400)
RBC: 4.98 MIL/uL (ref 3.87–5.11)
RDW: 14 % (ref 11.5–15.5)
WBC: 7.9 10*3/uL (ref 4.0–10.5)

## 2011-06-28 LAB — POCT I-STAT, CHEM 8
BUN: 20 mg/dL (ref 6–23)
Calcium, Ion: 1.21 mmol/L (ref 1.12–1.32)
Chloride: 105 mEq/L (ref 96–112)
Creatinine, Ser: 1.2 mg/dL (ref 0.4–1.2)
Glucose, Bld: 89 mg/dL (ref 70–99)
HCT: 40 % (ref 36.0–46.0)
Hemoglobin: 13.6 g/dL (ref 12.0–15.0)
Potassium: 3.6 mEq/L (ref 3.5–5.1)
Sodium: 142 mEq/L (ref 135–145)
TCO2: 27 mmol/L (ref 0–100)

## 2011-06-28 LAB — POCT CARDIAC MARKERS
CKMB, poc: 1.1 ng/mL (ref 1.0–8.0)
Myoglobin, poc: 43 ng/mL (ref 12–200)
Troponin i, poc: <0.05 ng/mL (ref 0.00–0.09)

## 2011-06-28 LAB — URINE MICROSCOPIC-ADD ON

## 2011-06-28 LAB — D-DIMER, QUANTITATIVE (NOT AT ARMC): D-Dimer, Quant: 0.37 ug/mL-FEU (ref 0.00–0.48)

## 2011-07-08 DIAGNOSIS — J209 Acute bronchitis, unspecified: Secondary | ICD-10-CM | POA: Insufficient documentation

## 2011-08-14 DIAGNOSIS — D126 Benign neoplasm of colon, unspecified: Secondary | ICD-10-CM | POA: Insufficient documentation

## 2011-09-05 DIAGNOSIS — R070 Pain in throat: Secondary | ICD-10-CM | POA: Insufficient documentation

## 2011-10-30 DIAGNOSIS — R5383 Other fatigue: Secondary | ICD-10-CM | POA: Insufficient documentation

## 2011-10-31 ENCOUNTER — Encounter: Payer: Self-pay | Admitting: *Deleted

## 2011-10-31 ENCOUNTER — Ambulatory Visit (INDEPENDENT_AMBULATORY_CARE_PROVIDER_SITE_OTHER): Payer: BC Managed Care – PPO | Admitting: Cardiology

## 2011-10-31 ENCOUNTER — Encounter: Payer: Self-pay | Admitting: Cardiology

## 2011-10-31 DIAGNOSIS — I10 Essential (primary) hypertension: Secondary | ICD-10-CM

## 2011-10-31 DIAGNOSIS — R0789 Other chest pain: Secondary | ICD-10-CM | POA: Insufficient documentation

## 2011-10-31 DIAGNOSIS — E663 Overweight: Secondary | ICD-10-CM | POA: Insufficient documentation

## 2011-10-31 DIAGNOSIS — E785 Hyperlipidemia, unspecified: Secondary | ICD-10-CM | POA: Insufficient documentation

## 2011-10-31 DIAGNOSIS — R079 Chest pain, unspecified: Secondary | ICD-10-CM

## 2011-10-31 NOTE — Assessment & Plan Note (Signed)
We discussed this and she should have efforts at weight loss with diet and increased exercise.

## 2011-10-31 NOTE — Assessment & Plan Note (Signed)
She reports predominantly hypertriglyceridemia. I will defer management to her primary provider.

## 2011-10-31 NOTE — Progress Notes (Signed)
HPI The patient presents for evaluation of chest discomfort. She has no past cardiac history. She does however have cardiovascular risk factors. Recently she was having some squeezing chest tightness. This happened about 2 days ago at rest. It only happened for a few second period she was somewhat lightheaded with this. She did not describe diaphoresis. She had no radiation to her jaw or to her arms. She had no nausea or vomiting. She has been having other chest discomfort that she's noticed with walking. This has been more of a pressure sensation. It happens when she walks a moderate distance on level ground. She does be much attention to it and isn't sure how long it lasts. It is mild. There were no associated symptoms. She has not had the same sensation at rest. She's not exercising routinely. She denies any palpitations, presyncope or syncope. She has no PND or orthopnea.  No Known Allergies  Current Outpatient Prescriptions  Medication Sig Dispense Refill  . albuterol (PROVENTIL HFA;VENTOLIN HFA) 108 (90 BASE) MCG/ACT inhaler Inhale 2 puffs into the lungs every 6 (six) hours as needed.      . ALPRAZolam (XANAX) 0.25 MG tablet Take 0.25 mg by mouth as needed.      Marland Kitchen aspirin 81 MG tablet Take 81 mg by mouth daily.      . budesonide-formoterol (SYMBICORT) 160-4.5 MCG/ACT inhaler Inhale 2 puffs into the lungs 2 (two) times daily.      . Calcium Carbonate (CALCIUM OYSTER SHELL PO) Take 1 tablet by mouth daily.      . cholecalciferol (VITAMIN D) 1000 UNITS tablet Take 1,000 Units by mouth daily.      Marland Kitchen doxazosin (CARDURA) 2 MG tablet Take 2 mg by mouth daily.      Marland Kitchen estradiol (ESTRACE) 1 MG tablet Take 1 mg by mouth daily.      . furosemide (LASIX) 40 MG tablet Take 40 mg by mouth daily.      Marland Kitchen losartan (COZAAR) 100 MG tablet Take 100 mg by mouth daily.      . niacin (NIASPAN) 1000 MG CR tablet Take 1,000 mg by mouth at bedtime.      Marland Kitchen omega-3 acid ethyl esters (LOVAZA) 1 G capsule Take 1 g by  mouth 4 (four) times daily.      . pantoprazole (PROTONIX) 40 MG tablet Take 40 mg by mouth daily.      Marland Kitchen PARoxetine (PAXIL) 40 MG tablet Take 40 mg by mouth every morning.      . potassium chloride (MICRO-K) 10 MEQ CR capsule Take 10 mEq by mouth 2 (two) times daily.      . traZODone (DESYREL) 150 MG tablet Take 150 mg by mouth at bedtime.        Past Medical History  Diagnosis Date  . Hyperlipidemia   . Hypertension   . Asthma   . GERD (gastroesophageal reflux disease)   . DJD (degenerative joint disease)     L-Spine  . Hematuria   . Fibroids   . Retinal tear 2005    Small right retinal tear  . Shingles 1980's    on her waist  . Colon polyp     Past Surgical History  Procedure Date  . Spinal fusion   . Finger surgery     left third finger mucoid cyst removed  . Abdominal hysterectomy     Family History  Problem Relation Age of Onset  . Heart failure Father     Possibly related  to EtOH  . Alcohol abuse Father   . Emphysema Mother   . Heart failure Mother     Possibly related to EtOH  . Diabetes Mother   . Alcohol abuse Mother   . Valvular heart disease Brother     History   Social History  . Marital Status: Married    Spouse Name: N/A    Number of Children: N/A  . Years of Education: N/A   Occupational History  . Not on file.   Social History Main Topics  . Smoking status: Former Smoker -- 1.0 packs/day for 20 years    Types: Cigarettes    Quit date: 09/23/1978  . Smokeless tobacco: Never Used  . Alcohol Use: No  . Drug Use: No  . Sexually Active:    Other Topics Concern  . Not on file   Social History Narrative   Husband with heart transplant.  Lives with husband and son.    ROS:  As stated in the HPI and negative for all other systems.   PHYSICAL EXAM BP 130/80  Pulse 77  Ht 5\' 2"  (1.575 m)  Wt 182 lb (82.555 kg)  BMI 33.29 kg/m2 GENERAL:  Well appearing HEENT:  Pupils equal round and reactive, fundi not visualized, oral mucosa  unremarkable NECK:  No jugular venous distention, waveform within normal limits, carotid upstroke brisk and symmetric, no bruits, no thyromegaly LYMPHATICS:  No cervical, inguinal adenopathy LUNGS:  Clear to auscultation bilaterally BACK:  No CVA tenderness CHEST:  Unremarkable HEART:  PMI not displaced or sustained,S1 and S2 within normal limits, no S3, no S4, no clicks, no rubs, no murmurs ABD:  Flat, positive bowel sounds normal in frequency in pitch, no bruits, no rebound, no guarding, no midline pulsatile mass, no hepatomegaly, no splenomegaly EXT:  2 plus pulses throughout, no edema, no cyanosis no clubbing SKIN:  No rashes no nodules NEURO:  Cranial nerves II through XII grossly intact, motor grossly intact throughout PSYCH:  Cognitively intact, oriented to person place and time  EKG: Sinus rhythm, rate 77, axis within normal limits, intervals within normal limits, poor anterior R wave progression, no acute ST-T wave changes. 10/31/2011  ASSESSMENT AND PLAN

## 2011-10-31 NOTE — Patient Instructions (Signed)
The current medical regimen is effective;  continue present plan and medications.  Your physician has requested that you have a lexiscan myoview. For further information please visit [email or web address]. Please follow instruction sheet, as given.  Follow up will be based on these results 

## 2011-10-31 NOTE — Assessment & Plan Note (Signed)
Her blood pressure is controlled and she will continue the meds as listed 

## 2011-10-31 NOTE — Assessment & Plan Note (Signed)
Her pain has typical and atypical features. She is cardiovascular risk factors. I will screen her with a stress perfusion study.

## 2011-11-11 ENCOUNTER — Ambulatory Visit (HOSPITAL_COMMUNITY): Payer: BC Managed Care – PPO | Attending: Cardiology | Admitting: Radiology

## 2011-11-11 DIAGNOSIS — M25519 Pain in unspecified shoulder: Secondary | ICD-10-CM | POA: Insufficient documentation

## 2011-11-11 DIAGNOSIS — R0789 Other chest pain: Secondary | ICD-10-CM | POA: Insufficient documentation

## 2011-11-11 DIAGNOSIS — Z87891 Personal history of nicotine dependence: Secondary | ICD-10-CM | POA: Insufficient documentation

## 2011-11-11 DIAGNOSIS — I10 Essential (primary) hypertension: Secondary | ICD-10-CM | POA: Insufficient documentation

## 2011-11-11 DIAGNOSIS — R0989 Other specified symptoms and signs involving the circulatory and respiratory systems: Secondary | ICD-10-CM | POA: Insufficient documentation

## 2011-11-11 DIAGNOSIS — E785 Hyperlipidemia, unspecified: Secondary | ICD-10-CM | POA: Insufficient documentation

## 2011-11-11 DIAGNOSIS — R079 Chest pain, unspecified: Secondary | ICD-10-CM

## 2011-11-11 DIAGNOSIS — R0602 Shortness of breath: Secondary | ICD-10-CM

## 2011-11-11 DIAGNOSIS — E669 Obesity, unspecified: Secondary | ICD-10-CM | POA: Insufficient documentation

## 2011-11-11 DIAGNOSIS — R0609 Other forms of dyspnea: Secondary | ICD-10-CM | POA: Insufficient documentation

## 2011-11-11 DIAGNOSIS — J45909 Unspecified asthma, uncomplicated: Secondary | ICD-10-CM | POA: Insufficient documentation

## 2011-11-11 DIAGNOSIS — M546 Pain in thoracic spine: Secondary | ICD-10-CM | POA: Insufficient documentation

## 2011-11-11 DIAGNOSIS — R42 Dizziness and giddiness: Secondary | ICD-10-CM | POA: Insufficient documentation

## 2011-11-11 MED ORDER — TECHNETIUM TC 99M TETROFOSMIN IV KIT
33.0000 | PACK | Freq: Once | INTRAVENOUS | Status: AC | PRN
  Administered 2011-11-11: 33 via INTRAVENOUS

## 2011-11-11 MED ORDER — TECHNETIUM TC 99M TETROFOSMIN IV KIT
11.0000 | PACK | Freq: Once | INTRAVENOUS | Status: AC | PRN
  Administered 2011-11-11: 11 via INTRAVENOUS

## 2011-11-11 MED ORDER — REGADENOSON 0.4 MG/5ML IV SOLN
0.4000 mg | Freq: Once | INTRAVENOUS | Status: AC
  Administered 2011-11-11: 0.4 mg via INTRAVENOUS

## 2011-11-11 NOTE — Progress Notes (Signed)
[facility] SITE 3 NUCLEAR MED [address] [place] [address] [phone]  Cardiology Nuclear Med Study  MODEAN MCCULLUM is a 66 y.o. female [id number] 26-Mar-1946   Nuclear Med Background Indication for Stress Test:  Evaluation for Ischemia History:  Asthma and '06 GXT:OK per patient. Cardiac Risk Factors: History of Smoking, Hypertension, Lipids and Obesity  Symptoms:  Chest Pain/Pressure with and without Exertion, Constant Pain across Upper Back and Shoulders (last episode of chest discomfort:now, 1/10), DOE, Fatigue and Light-Headedness with Chest Pain   Nuclear Pre-Procedure Caffeine/Decaff Intake:  None NPO After: 7:00pm   Lungs:  Clear.  O2 SAT 97% on RA. IV 0.9% NS with Angio Cath:  20g  IV Site: R Wrist  IV Started by:  Stanton Kidney, EMT-P  Chest Size (in):  38 Cup Size: DD  Height: 5\' 2"  (1.575 m)  Weight:  175 lb (79.379 kg)  BMI:  Body mass index is 32.01 kg/(m^2). Tech Comments:  NA    Nuclear Med Study 1 or 2 day study: 1 day  Stress Test Type:  Treadmill/Lexiscan  Reading MD: Marca Ancona, MD  Order Authorizing Provider:  Rollene Rotunda, MD  Resting Radionuclide: Technetium 71m Tetrofosmin  Resting Radionuclide Dose: 11.0 mCi   Stress Radionuclide:  Technetium 78m Tetrofosmin  Stress Radionuclide Dose: 33.0 mCi           Stress Protocol Rest HR: 68 Stress HR: 100  Rest BP: Sitting:122/77  Standing:106/70 Stress BP: 142/64  Exercise Time (min): 2:00 METS: n/a   Predicted Max HR: 157 bpm % Max HR: 63.69 bpm Rate Pressure Product: 88416   Dose of Adenosine (mg):  n/a Dose of Lexiscan: 0.4 mg  Dose of Atropine (mg): n/a Dose of Dobutamine: n/a mcg/kg/min (at max HR)  Stress Test Technologist: Smiley Houseman, CMA-N  Nuclear Technologist:  Doyne Keel, CNMT     Rest Procedure:  Myocardial perfusion imaging was performed at rest 45 minutes following the intravenous administration of Technetium 43m Tetrofosmin.  Rest ECG: Minor  nonspecific T-wave changes.  Stress Procedure:  The patient received IV Lexiscan 0.4 mg over 15-seconds with concurrent low level exercise and then Technetium 31m Tetrofosmin was injected at 30-seconds while the patient continued walking one more minute.  There were more diffuse T-wave changes with Lexiscan, along with chest pressure 3-4/10.  Quantitative spect images were obtained after a 45-minute delay.  Stress ECG: No significant change from baseline ECG  QPS Raw Data Images:  Normal; no motion artifact; normal heart/lung ratio. Stress Images:  Small apical perfusion defect.  Rest Images:  Small apical perfusion defect.  Subtraction (SDS):  Small fixed apical perfusion defect.  Transient Ischemic Dilatation (Normal <1.22):  1.01 Lung/Heart Ratio (Normal <0.45):  0.26  Quantitative Gated Spect Images QGS EDV:  68 ml QGS ESV:  26 ml QGS cine images:  NL LV Function; NL Wall Motion QGS EF: 62%  Impression Exercise Capacity:  Lexiscan with low level exercise. BP Response:  Normal blood pressure response. Clinical Symptoms:  Chest pressure.  ECG Impression:  No significant ST segment change suggestive of ischemia. Comparison with Prior Nuclear Study: No previous nuclear study performed  Overall Impression:  Low risk stress nuclear study.  Small fixed apical perfusion defect may be artifact due to apical thinning.  Normal wall motion and EF.   Albertine Lafoy [employer]

## 2012-02-27 ENCOUNTER — Other Ambulatory Visit: Payer: Self-pay | Admitting: Internal Medicine

## 2012-02-27 ENCOUNTER — Ambulatory Visit
Admission: RE | Admit: 2012-02-27 | Discharge: 2012-02-27 | Disposition: A | Discharge status: Home / Self Care | Payer: BC Managed Care – PPO | Source: Ambulatory Visit | Attending: Internal Medicine | Admitting: Internal Medicine

## 2012-02-27 DIAGNOSIS — M545 Low back pain, unspecified: Secondary | ICD-10-CM

## 2012-03-01 DIAGNOSIS — B029 Zoster without complications: Secondary | ICD-10-CM | POA: Insufficient documentation

## 2012-06-02 DIAGNOSIS — Z2839 Other underimmunization status: Secondary | ICD-10-CM | POA: Insufficient documentation

## 2012-06-02 DIAGNOSIS — Z283 Underimmunization status: Secondary | ICD-10-CM | POA: Insufficient documentation

## 2012-10-29 DIAGNOSIS — E669 Obesity, unspecified: Secondary | ICD-10-CM | POA: Insufficient documentation

## 2012-12-23 ENCOUNTER — Telehealth: Payer: Self-pay | Admitting: *Deleted

## 2012-12-23 NOTE — Telephone Encounter (Signed)
Spoke with pt. Regarding a Mammogram report for breast pain that Dr. Tresa Res received, the report was normal but Dr. Tresa Res would Need to see her and do a breast before saying there are no serious problems. Pt said she's not having any problems now Her AEX is scheduled for 02/10/13 with Dr. Tresa Res, and if any problems occur before than she will call and schedule an appt.

## 2013-02-09 ENCOUNTER — Encounter: Payer: Self-pay | Admitting: Obstetrics and Gynecology

## 2013-02-10 ENCOUNTER — Encounter: Payer: Self-pay | Admitting: Obstetrics and Gynecology

## 2013-02-10 ENCOUNTER — Ambulatory Visit (INDEPENDENT_AMBULATORY_CARE_PROVIDER_SITE_OTHER): Payer: 59 | Admitting: Obstetrics and Gynecology

## 2013-02-10 VITALS — BP 122/70 | Ht 62.25 in | Wt 175.0 lb

## 2013-02-10 DIAGNOSIS — Z01419 Encounter for gynecological examination (general) (routine) without abnormal findings: Secondary | ICD-10-CM

## 2013-02-10 MED ORDER — ESTRADIOL 1 MG PO TABS: 1.0000 mg | ORAL_TABLET | Freq: Every day | ORAL | Status: DC

## 2013-02-10 NOTE — Progress Notes (Signed)
67 y.o.   Married    Caucasian   female   G3P0012   here for annual exam.  On ERT b/c it helps stabilize her depression.  Tried to stop it again but had trouble sleeping so went back on.    Sees [facility] He[facility] for psych twice a year for med refills.   No LMP recorded. Patient has had a hysterectomy.          Sexually active: yes  The current method of family planning is tubal ligation and status post hysterectomy.    Exercising: fitness center, treadmill, weights mostly walking for now. Last mammogram:  12/2012 benign(Rt. Breast Pain) Last pap smear:08/16/08 neg History of abnormal pap: no Smoking:quit in 1980 Alcohol: occ alcohol Last colonoscopy:08/2009 normal repeat in 5 years Last Bone Density: 2009 normal  Last tetanus shot: 12/2003 Last cholesterol check: 10/2012 [facility]  Hgb:    pcp            Urine:pcp   Family History  Problem Relation Age of Onset  . Heart failure Father     Possibly related to EtOH  . Alcohol abuse Father   . Emphysema Father   . Emphysema Mother   . Heart failure Mother     Possibly related to EtOH  . Diabetes Mother   . Alcohol abuse Mother   . Valvular heart disease Brother     Patient Active Problem List   Diagnosis Date Noted  . Chest pain 10/31/2011  . Overweight 10/31/2011  . Hyperlipidemia   . Hypertension     Past Medical History  Diagnosis Date  . Hyperlipidemia   . Hypertension   . Asthma   . GERD (gastroesophageal reflux disease)   . DJD (degenerative joint disease)     L-Spine  . Hematuria   . Fibroids   . Retinal tear 2005    Small right retinal tear  . Shingles 1980's    on her waist  . Colon polyp   . Depression   . Anxiety   . Bicornuate uterus     double cervix  . HNP (herniated nucleus pulposus)     lower back  . Deviated septum     Past Surgical History  Procedure Laterality Date  . Spinal fusion    . Finger surgery      left third finger mucoid cyst removed  . Hysteroscopy      D&C PMB Endo Cx Polyp   . Laparoscopic hysterectomy  7/209/10    R-TLH/BSO  Fibroids, BAck Pain, Adenomyosis  . Bone spur surgery      x 3  . Tubal ligation    . Removal vaginal septum      Allergies: Review of patient's allergies indicates no known allergies.  Current Outpatient Prescriptions  Medication Sig Dispense Refill  . albuterol (PROVENTIL HFA;VENTOLIN HFA) 108 (90 BASE) MCG/ACT inhaler Inhale 2 puffs into the lungs every 6 (six) hours as needed.      . ALPRAZolam (XANAX) 0.25 MG tablet Take 0.25 mg by mouth as needed.      Marland Kitchen aspirin 81 MG tablet Take 81 mg by mouth daily.      . budesonide-formoterol (SYMBICORT) 160-4.5 MCG/ACT inhaler Inhale 1 puff into the lungs as needed.       . Calcium Carbonate (CALCIUM OYSTER SHELL PO) Take 1,200 mg by mouth daily.       . cholecalciferol (VITAMIN D) 1000 UNITS tablet Take 1,000 Units by mouth daily.      Marland Kitchen  estradiol (ESTRACE) 1 MG tablet Take 1 mg by mouth daily.      . furosemide (LASIX) 40 MG tablet Take 40 mg by mouth daily.      Marland Kitchen losartan (COZAAR) 100 MG tablet Take 100 mg by mouth daily.      . niacin (NIASPAN) 1000 MG CR tablet Take 1,000 mg by mouth at bedtime.      . pantoprazole (PROTONIX) 40 MG tablet Take 40 mg by mouth daily.      Marland Kitchen PARoxetine (PAXIL) 40 MG tablet Take 40 mg by mouth every morning.      . potassium chloride (MICRO-K) 10 MEQ CR capsule Take 10 mEq by mouth 2 (two) times daily.      . traZODone (DESYREL) 150 MG tablet Take 150 mg by mouth at bedtime.       No current facility-administered medications for this visit.    ROS: Pertinent items are noted in HPI.  Social Hx:  Married, two children.  Son is divorced and pt keeps his two boys when they are out of school.  Also has one daughter who has two children.  Exam:    BP 122/70  Ht 5' 2.25" (1.581 m)  Wt 175 lb (79.379 kg)  BMI 31.76 kg/m2  Ht stable and wt down 7 pounds from last year Wt Readings from Last 3 Encounters:  02/10/13 175 lb (79.379 kg)  11/11/11 175 lb  (79.379 kg)  10/31/11 182 lb (82.555 kg)     Ht Readings from Last 3 Encounters:  02/10/13 5' 2.25" (1.581 m)  11/11/11 5\' 2"  (1.575 m)  10/31/11 5\' 2"  (1.575 m)    General appearance: alert, cooperative and appears stated age Head: Normocephalic, without obvious abnormality, atraumatic Neck: no adenopathy, supple, symmetrical, trachea midline and thyroid not enlarged, symmetric, no tenderness/mass/nodules Lungs: clear to auscultation bilaterally Breasts: Inspection negative, No nipple retraction or dimpling, No nipple discharge or bleeding, No axillary or supraclavicular adenopathy, Normal to palpation without dominant masses Heart: regular rate and rhythm Abdomen: soft, non-tender; bowel sounds normal; no masses,  no organomegaly Extremities: extremities normal, atraumatic, no cyanosis or edema Skin: Skin color, texture, turgor normal. No rashes or lesions Lymph nodes: Cervical, supraclavicular, and axillary nodes normal. No abnormal inguinal nodes palpated Neurologic: Grossly normal   Pelvic: External genitalia:  no lesions              Urethra:  normal appearing urethra with no masses, tenderness or lesions              Bartholins and Skenes: normal                 Vagina: normal appearing vagina with normal color and discharge, no lesions              Cervix: normal appearance              Pap taken: no        Bimanual Exam:  Uterus:  absent                                      Adnexa: absent;  no masses                                      Rectovaginal: Confirms  Anus:  normal sphincter tone, no lesions  A: normal menopausal exam, on ERT     S/p R-TLH/BSO fibroids July 2010     Depression/anxiety     HTN., asthma, GERD, elevated chol     P: mammogram counseled on breast self exam, mammography screening, adequate intake of calcium and vitamin D, diet and exercise return annually or prn     An After Visit Summary was printed and  given to the patient.

## 2013-02-10 NOTE — Patient Instructions (Signed)

## 2013-11-12 DIAGNOSIS — Z1331 Encounter for screening for depression: Secondary | ICD-10-CM | POA: Insufficient documentation

## 2013-11-12 DIAGNOSIS — R718 Other abnormality of red blood cells: Secondary | ICD-10-CM | POA: Insufficient documentation

## 2013-12-22 ENCOUNTER — Emergency Department (HOSPITAL_COMMUNITY): Payer: Medicare Other

## 2013-12-22 ENCOUNTER — Encounter (HOSPITAL_COMMUNITY): Payer: Self-pay | Admitting: Emergency Medicine

## 2013-12-22 ENCOUNTER — Emergency Department (HOSPITAL_COMMUNITY)
Admission: EM | Admit: 2013-12-22 | Discharge: 2013-12-22 | Disposition: A | Discharge status: Home / Self Care | Payer: Medicare Other | Attending: Emergency Medicine | Admitting: Emergency Medicine

## 2013-12-22 DIAGNOSIS — M51379 Other intervertebral disc degeneration, lumbosacral region without mention of lumbar back pain or lower extremity pain: Secondary | ICD-10-CM | POA: Insufficient documentation

## 2013-12-22 DIAGNOSIS — Z8742 Personal history of other diseases of the female genital tract: Secondary | ICD-10-CM | POA: Insufficient documentation

## 2013-12-22 DIAGNOSIS — R071 Chest pain on breathing: Secondary | ICD-10-CM | POA: Insufficient documentation

## 2013-12-22 DIAGNOSIS — R079 Chest pain, unspecified: Secondary | ICD-10-CM

## 2013-12-22 DIAGNOSIS — IMO0002 Reserved for concepts with insufficient information to code with codable children: Secondary | ICD-10-CM | POA: Insufficient documentation

## 2013-12-22 DIAGNOSIS — F329 Major depressive disorder, single episode, unspecified: Secondary | ICD-10-CM | POA: Insufficient documentation

## 2013-12-22 DIAGNOSIS — M5137 Other intervertebral disc degeneration, lumbosacral region: Secondary | ICD-10-CM | POA: Insufficient documentation

## 2013-12-22 DIAGNOSIS — Z8669 Personal history of other diseases of the nervous system and sense organs: Secondary | ICD-10-CM | POA: Insufficient documentation

## 2013-12-22 DIAGNOSIS — Z860100 Personal history of colon polyps, unspecified: Secondary | ICD-10-CM | POA: Insufficient documentation

## 2013-12-22 DIAGNOSIS — Z87448 Personal history of other diseases of urinary system: Secondary | ICD-10-CM | POA: Insufficient documentation

## 2013-12-22 DIAGNOSIS — J45909 Unspecified asthma, uncomplicated: Secondary | ICD-10-CM | POA: Insufficient documentation

## 2013-12-22 DIAGNOSIS — Z87891 Personal history of nicotine dependence: Secondary | ICD-10-CM | POA: Insufficient documentation

## 2013-12-22 DIAGNOSIS — Z79899 Other long term (current) drug therapy: Secondary | ICD-10-CM | POA: Insufficient documentation

## 2013-12-22 DIAGNOSIS — K219 Gastro-esophageal reflux disease without esophagitis: Secondary | ICD-10-CM | POA: Insufficient documentation

## 2013-12-22 DIAGNOSIS — Z7982 Long term (current) use of aspirin: Secondary | ICD-10-CM | POA: Insufficient documentation

## 2013-12-22 DIAGNOSIS — F3289 Other specified depressive episodes: Secondary | ICD-10-CM | POA: Insufficient documentation

## 2013-12-22 DIAGNOSIS — Z8619 Personal history of other infectious and parasitic diseases: Secondary | ICD-10-CM | POA: Insufficient documentation

## 2013-12-22 DIAGNOSIS — F411 Generalized anxiety disorder: Secondary | ICD-10-CM | POA: Insufficient documentation

## 2013-12-22 DIAGNOSIS — E785 Hyperlipidemia, unspecified: Secondary | ICD-10-CM | POA: Insufficient documentation

## 2013-12-22 DIAGNOSIS — Z87718 Personal history of other specified (corrected) congenital malformations of genitourinary system: Secondary | ICD-10-CM | POA: Insufficient documentation

## 2013-12-22 DIAGNOSIS — I10 Essential (primary) hypertension: Secondary | ICD-10-CM | POA: Insufficient documentation

## 2013-12-22 LAB — CBC
HCT: 40.6 % (ref 36.0–46.0)
Hemoglobin: 13.5 g/dL (ref 12.0–15.0)
MCH: 26.5 pg (ref 26.0–34.0)
MCHC: 33.3 g/dL (ref 30.0–36.0)
MCV: 79.6 fL (ref 78.0–100.0)
Platelets: 230 10*3/uL (ref 150–400)
RBC: 5.1 MIL/uL (ref 3.87–5.11)
RDW: 16.1 % — ABNORMAL HIGH (ref 11.5–15.5)
WBC: 7.3 10*3/uL (ref 4.0–10.5)

## 2013-12-22 LAB — BASIC METABOLIC PANEL
BUN: 11 mg/dL (ref 6–23)
CO2: 24 mEq/L (ref 19–32)
Calcium: 8.8 mg/dL (ref 8.4–10.5)
Chloride: 102 mEq/L (ref 96–112)
Creatinine, Ser: 1 mg/dL (ref 0.50–1.10)
GFR calc Af Amer: 67 mL/min — ABNORMAL LOW (ref >90)
GFR calc non Af Amer: 58 mL/min — ABNORMAL LOW (ref >90)
Glucose, Bld: 109 mg/dL — ABNORMAL HIGH (ref 70–99)
Potassium: 3.7 mEq/L (ref 3.7–5.3)
Sodium: 141 mEq/L (ref 137–147)

## 2013-12-22 LAB — I-STAT TROPONIN, ED
Troponin i, poc: 0 ng/mL (ref 0.00–0.08)
Troponin i, poc: 0 ng/mL (ref 0.00–0.08)

## 2013-12-22 MED ORDER — NITROGLYCERIN 2 % TD OINT
1.0000 [in_us] | TOPICAL_OINTMENT | Freq: Once | TRANSDERMAL | Status: AC
Start: 2013-12-22 — End: 2013-12-22
  Administered 2013-12-22: 1 [in_us] via TOPICAL
  Filled 2013-12-22: qty 1

## 2013-12-22 MED ORDER — ACETAMINOPHEN 325 MG PO TABS
650.0000 mg | ORAL_TABLET | Freq: Once | ORAL | Status: AC
  Administered 2013-12-22: 650 mg via ORAL
  Filled 2013-12-22: qty 2

## 2013-12-22 MED ORDER — ASPIRIN 81 MG PO CHEW
324.0000 mg | CHEWABLE_TABLET | Freq: Once | ORAL | Status: AC
  Administered 2013-12-22: 324 mg via ORAL
  Filled 2013-12-22: qty 4

## 2013-12-22 MED ORDER — NITROGLYCERIN 0.4 MG SL SUBL: 0.4000 mg | SUBLINGUAL_TABLET | SUBLINGUAL | Status: DC | PRN

## 2013-12-22 NOTE — ED Notes (Signed)
Indigestion like pain x 1 hour rt jaw pain some nausea  and sob and got sweaty feels worn out

## 2013-12-22 NOTE — ED Provider Notes (Signed)
CSN: [id number]     Arrival date & time 12/22/13  1425 History   First MD Initiated Contact with Patient 12/22/13 1432     Chief Complaint  Patient presents with  . Chest Pain     (Consider location/radiation/quality/duration/timing/severity/associated sxs/prior Treatment) HPI Patient reports about 2 PM she was getting ready to go shopping and she acutely had pain in the center of her chest that radiated to her right jaw. She states she's had it before and thought it was "indigestion". She denies any burning sensation to describe the indigestion but states it was more of a pain that was sharp initially and now is heavy in nature. She does report she has a history of actual reflux symptoms with burning however that pain radiates up from her epigastric area into her throat. She is not having that today. She felt short of breath and states that severity was intense for 20-30 minutes then changed. She states nothing she does makes the pain hurt more, nothing she did make the pain feel better. She states her pain at its worst today was an 8/10 and currently it is a 1/10. She does not relate the pain to any activity such as eating or exercise. She states she's had the pain about once or twice a month for the past year. However today was the worst and the longest it has lasted. She states she had a stress test done about 3 years ago and it was okay. She does not recall the cardiologist.   Family history no history according are deceased, she states both her parents had COPD.  PCP Dr Joylene Draft  Past Medical History  Diagnosis Date  . Hyperlipidemia   . Hypertension   . Asthma   . GERD (gastroesophageal reflux disease)   . DJD (degenerative joint disease)     L-Spine  . Hematuria   . Fibroids   . Retinal tear 2005    Small right retinal tear  . Shingles 1980's    on her waist  . Colon polyp   . Depression   . Anxiety   . Bicornuate uterus     double cervix  . HNP (herniated nucleus pulposus)      lower back  . Deviated septum    Past Surgical History  Procedure Laterality Date  . Spinal fusion    . Finger surgery      left third finger mucoid cyst removed  . Hysteroscopy      D&C PMB Endo Cx Polyp  . Laparoscopic hysterectomy  7/209/10    R-TLH/BSO  Fibroids, BAck Pain, Adenomyosis  . Bone spur surgery      x 3  . Tubal ligation    . Removal vaginal septum     Family History  Problem Relation Age of Onset  . Heart failure Father     Possibly related to EtOH  . Alcohol abuse Father   . Emphysema Father   . Emphysema Mother   . Heart failure Mother     Possibly related to EtOH  . Diabetes Mother   . Alcohol abuse Mother   . Valvular heart disease Brother    History  Substance Use Topics  . Smoking status: Former Smoker -- 1.00 packs/day for 20 years    Types: Cigarettes    Quit date: 09/23/1978  . Smokeless tobacco: Never Used  . Alcohol Use: 0.5 oz/week    1 drink(s) per week     Comment: occ alcohol   Retired Lives  at home Lives with spouse, who has had a heart transplant   OB History   Grav Para Term Preterm Abortions TAB SAB Ect Mult Living   3 2   1  1   2      Review of Systems  All other systems reviewed and are negative.      Allergies  Review of patient's allergies indicates no known allergies.  Home Medications   Current Outpatient Rx  Name  Route  Sig  Dispense  Refill  . albuterol (PROVENTIL HFA;VENTOLIN HFA) 108 (90 BASE) MCG/ACT inhaler   Inhalation   Inhale 2 puffs into the lungs every 6 (six) hours as needed for shortness of breath.          Marland Kitchen aspirin EC 81 MG tablet   Oral   Take 81 mg by mouth daily.         Marland Kitchen BIOTIN PO   Oral   Take 1 tablet by mouth daily.         . budesonide-formoterol (SYMBICORT) 160-4.5 MCG/ACT inhaler   Inhalation   Inhale 2 puffs into the lungs daily.          . cetirizine (ZYRTEC) 10 MG tablet   Oral   Take 10 mg by mouth daily.         . cholecalciferol (VITAMIN D) 1000  UNITS tablet   Oral   Take 1,000 Units by mouth daily.         Marland Kitchen doxazosin (CARDURA) 2 MG tablet   Oral   Take 2 mg by mouth daily.         Marland Kitchen estradiol (ESTRACE) 1 MG tablet   Oral   Take 1 mg by mouth daily.         . fluticasone (FLONASE) 50 MCG/ACT nasal spray   Each Nare   Place 2 sprays into both nostrils daily.         . furosemide (LASIX) 20 MG tablet   Oral   Take 20 mg by mouth daily.         Marland Kitchen losartan (COZAAR) 100 MG tablet   Oral   Take 100 mg by mouth daily.         . Multiple Vitamins-Minerals (MULTIVITAMIN PO)   Oral   Take 1 tablet by mouth daily.         . niacin (NIASPAN) 1000 MG CR tablet   Oral   Take 1,000 mg by mouth at bedtime.         . pantoprazole (PROTONIX) 40 MG tablet   Oral   Take 40 mg by mouth daily.         Marland Kitchen PARoxetine (PAXIL) 40 MG tablet   Oral   Take 40 mg by mouth every morning.         . potassium chloride SA (K-DUR,KLOR-CON) 20 MEQ tablet   Oral   Take 20 mEq by mouth daily.         . traZODone (DESYREL) 150 MG tablet   Oral   Take 150 mg by mouth at bedtime.         . vitamin B-12 (CYANOCOBALAMIN) 1000 MCG tablet   Oral   Take 1,000 mcg by mouth daily.          BP 128/57  Pulse 79  Temp(Src) 97.9 F (36.6 C) (Oral)  Resp 22  Ht 5\' 2"  (1.575 m)  Wt 187 lb 3.2 oz (84.913 kg)  BMI 34.23 kg/m2  SpO2 93%  Vital signs normal   Physical Exam  Nursing note and vitals reviewed. Constitutional: She is oriented to person, place, and time. She appears well-developed and well-nourished.  Non-toxic appearance. She does not appear ill. No distress.  HENT:  Head: Normocephalic and atraumatic.  Right Ear: External ear normal.  Left Ear: External ear normal.  Nose: Nose normal. No mucosal edema or rhinorrhea.  Mouth/Throat: Oropharynx is clear and moist and mucous membranes are normal. No dental abscesses or uvula swelling.  Eyes: Conjunctivae and EOM are normal. Pupils are equal, round, and  reactive to light.  Neck: Normal range of motion and full passive range of motion without pain. Neck supple.  Cardiovascular: Normal rate, regular rhythm and normal heart sounds.  Exam reveals no gallop and no friction rub.   No murmur heard. Pulmonary/Chest: Effort normal and breath sounds normal. No respiratory distress. She has no wheezes. She has no rhonchi. She has no rales. She exhibits no tenderness and no crepitus.    Mild chest wall tenderness  Abdominal: Soft. Normal appearance and bowel sounds are normal. She exhibits no distension. There is no tenderness. There is no rebound and no guarding.  Musculoskeletal: Normal range of motion. She exhibits no edema and no tenderness.  Moves all extremities well.   Neurological: She is alert and oriented to person, place, and time. She has normal strength. No cranial nerve deficit.  Skin: Skin is warm, dry and intact. No rash noted. No erythema. No pallor.  Psychiatric: She has a normal mood and affect. Her speech is normal and behavior is normal. Her mood appears not anxious.    ED Course  Procedures (including critical care time)  Medications  nitroGLYCERIN (NITROGLYN) 2 % ointment 1 inch (1 inch Topical Given 12/22/13 1549)  aspirin chewable tablet 324 mg (324 mg Oral Given 12/22/13 1549)  acetaminophen (TYLENOL) tablet 650 mg (650 mg Oral Given 12/22/13 1548)   We discussed getting a second troponin which will be about 2 half hours after her pain started. Patient is agreeable.  Recheck at discharge, she reports her chest pain is a "0/10". She was given option to be admitted, but she does not want to be admitted. We discussed her test today shows she is not having a heart attack today, however, that she could still have underlying heart disease. She states she will contact her PCPs office in the morning to get cardiology referral and I will also give her the number of the cardiologist on call. We discussed trying nitroglycerin sublingually if  she gets the pain again. She was strongly advised to sit down when she takes it. She is to return to the ED if her pain gets worse. Patient did have an stress test about 3 years ago that was negative. Her chest pains are atypical in that they are not related to any activity or food ingestion.  Labs Review Results for orders placed during the hospital encounter of 12/22/13  CBC      Result Value Ref Range   WBC 7.3  4.0 - 10.5 K/uL   RBC 5.10  3.87 - 5.11 MIL/uL   Hemoglobin 13.5  12.0 - 15.0 g/dL   HCT 40.6  36.0 - 46.0 %   MCV 79.6  78.0 - 100.0 fL   MCH 26.5  26.0 - 34.0 pg   MCHC 33.3  30.0 - 36.0 g/dL   RDW 16.1 (*) 11.5 - 15.5 %   Platelets 230  150 - 400 K/uL  BASIC METABOLIC  PANEL      Result Value Ref Range   Sodium 141  137 - 147 mEq/L   Potassium 3.7  3.7 - 5.3 mEq/L   Chloride 102  96 - 112 mEq/L   CO2 24  19 - 32 mEq/L   Glucose, Bld 109 (*) 70 - 99 mg/dL   BUN 11  6 - 23 mg/dL   Creatinine, Ser 1.00  0.50 - 1.10 mg/dL   Calcium 8.8  8.4 - 10.5 mg/dL   GFR calc non Af Amer 58 (*) >90 mL/min   GFR calc Af Amer 67 (*) >90 mL/min  I-STAT TROPOININ, ED      Result Value Ref Range   Troponin i, poc 0.00  0.00 - 0.08 ng/mL   Comment 3           I-STAT TROPOININ, ED      Result Value Ref Range   Troponin i, poc 0.00  0.00 - 0.08 ng/mL   Comment 3            Laboratory interpretation all normal   Imaging Review Dg Chest 2 View  12/22/2013   CLINICAL DATA:  Chest pain  EXAM: CHEST  2 VIEW  COMPARISON:  August 24, 2008  FINDINGS: Lungs are clear. Heart size and pulmonary vascularity are normal. No adenopathy. No pneumothorax. No bone lesions.  IMPRESSION: No abnormality noted.   Electronically Signed   By: Lowella Grip M.D.   On: 12/22/2013 15:24     EKG Interpretation   Date/Time:  Wednesday December 22 2013 14:29:17 EDT Ventricular Rate:  97 PR Interval:  148 QRS Duration: 72 QT Interval:  346 QTC Calculation: 439 R Axis:   5 Text Interpretation:  Normal  sinus rhythm Minimal voltage criteria for  LVH, may be normal variant Inferior infarct , age undetermined Anterior  infarct , age undetermined No significant change since last tracing  Confirmed by Soniya Ashraf  MD-I, Luberta Grabinski ([id number]) on 12/22/2013 2:32:37 PM      MDM   Final diagnoses:  Chest pain    New Prescriptions   NITROGLYCERIN (NITROSTAT) 0.4 MG SL TABLET    Place 1 tablet (0.4 mg total) under the tongue every 5 (five) minutes as needed for chest pain (sit down when taking the medication).    Plan discharge   Rolland Porter, MD, Alanson Aly, MD 12/22/13 1739

## 2013-12-22 NOTE — Discharge Instructions (Signed)
Start taking a baby aspirin 81 mg a day. Either call Dr Perini's office to get a cardiology referral or call Dr Camillia Herter office to get an appointment to be seen. Try the Nitroglycerin tabs under your tongue if the pain returns, sit down when you use the nitroglycerin so you don't get light headed or pass out. Return to the ED if you get the pain again or you feel worse.    Aspirin and Your Heart Aspirin affects the way your blood clots and helps "thin" the blood. Aspirin has many uses in heart disease. It may be used as a primary prevention to help reduce the risk of heart related events. It also can be used as a secondary measure to prevent more heart attacks or to prevent additional damage from blood clots.  ASPIRIN MAY HELP IF YOU:  Have had a heart attack or chest pain.  Have undergone open heart surgery such as CABG (Coronary Artery Bypass Surgery).  Have had coronary angioplasty with or without stents.  Have experienced a stroke or TIA (transient ischemic attack).  Have peripheral vascular disease (PAD).  Have chronic heart rhythm problems such as atrial fibrillation.  Are at risk for heart disease. BEFORE STARTING ASPIRIN Before you start taking aspirin, your caregiver will need to review your medical history. Many things will need to be taken into consideration, such as:  Smoking status.  Blood pressure.  Diabetes.  Gender.  Weight.  Cholesterol level. ASPIRIN DOSES  Aspirin should only be taken on the advice of your caregiver. Talk to your caregiver about how much aspirin you should take. Aspirin comes in different doses such as:  81 mg.  162 mg.  325 mg.  The aspirin dose you take may be affected by many factors, some of which include:  Your current medications, especially if your are taking blood-thinners or anti-platelet medicine.  Liver function.  Heart disease risk.  Age.  Aspirin comes in two forms:  Non-enteric-coated. This type of aspirin  does not have a coating and is absorbed faster. Non-enteric coated aspirin is recommended for patients experiencing chest pain symptoms. This type of aspirin also comes in a chewable form.  Enteric-coated. This means the aspirin has a special coating that releases the medicine very slowly. Enteric-coated aspirin causes less stomach upset. This type of aspirin should not be chewed or crushed. ASPIRIN SIDE EFFECTS Daily use of aspirin can increase your risk of serious side effects. Some of these include:  Increased bleeding. This can range from a cut that does not stop bleeding to more serious problems such as stomach bleeding or bleeding into the brain (Intracerebral bleeding).  Increased bruising.  Stomach upset.  An allergic reaction such as red, itchy skin.  Increased risk of bleeding when combined with non-steroidal anti-inflammatory medicine (NSAIDS).  Alcohol should be drank in moderation when taking aspirin. Alcohol can increase the risk of stomach bleeding when taken with aspirin.  Aspirin should not be given to children less than 45 years of age due to the association of Reye syndrome. Reye syndrome is a serious illness that can affect the brain and liver. Studies have linked Reye syndrome with aspirin use in children.  People that have nasal polyps have an increased risk of developing an aspirin allergy. SEEK MEDICAL CARE IF:   You develop an allergic reaction such as:  Hives.  Itchy skin.  Swelling of the lips, tongue or face.  You develop stomach pain.  You have unusual bleeding or bruising.  You  have ringing in your ears. SEEK IMMEDIATE MEDICAL CARE IF:   You have severe chest pain, especially if the pain is crushing or pressure-like and spreads to the arms, back, neck, or jaw. THIS IS AN EMERGENCY. Do not wait to see if the pain will go away. Get medical help at once. Call your local emergency services (911 in the [place] DO NOT drive yourself to the hospital.  You  have stroke-like symptoms such as:  Loss of vision.  Difficulty talking.  Numbness or weakness on one side of your body.  Numbness or weakness in your arm or leg.  Not thinking clearly or feeling confused.  Your bowel movements are bloody, dark red or black in color.  You vomit or cough up blood.  You have blood in your urine.  You have shortness of breath, coughing or wheezing. MAKE SURE YOU:   Understand these instructions.  Will monitor your condition.  Seek immediate medical care if necessary. Document Released: 08/22/2008 Document Revised: 01/04/2013 Document Reviewed: 08/22/2008 [facility] Patient Information 2014 [place].  Cardiac Biomarkers Cardiac biomarkers are enzymes, proteins, and hormones that are associated with heart function, damage or failure. Some of the tests are specific for the heart while others are also elevated with skeletal muscle damage. Cardiac biomarkers are used for diagnostic and prognostic purposes and are frequently ordered by caregivers when someone comes into the Emergency Room complaining of symptoms, such as chest pain, pressure, nausea, and shortness of breath. These tests are ordered, along with other laboratory and non-laboratory tests, to detect heart failure (which is often a chronic, progressive condition affecting the ability of the heart to fill with blood and pump efficiently) and the acute coronary syndromes (ACS) as well as to help determine prognosis for people who have had a heart attack. ACS is a group of symptoms that reflect a sudden decrease in the amount of blood and oxygen, also termed 'ischemia,' reaching the heart. This decrease is frequently due to either a narrowing of the coronary arteries (atherosclerosis or vessel spasm) or unstable plaques, which can cause a blood clot (thrombus) and blockage of blood flow. If the oxygen supply is low, it can cause angina (pain); if blood flow is reduced, it can cause death of heart  cells (called myocardial infarction or heart attack) and can lead to death of the affected heart muscle cells and to permanent damage and scarring of the heart.  The goal with cardiac biomarkers is to be able to detect the presence and severity of an acute heart condition as soon as possible so that appropriate treatment can be initiated.  There are only a few cardiac biomarkers that are being routinely used by physicians. Some have been phased out because they are not as specific as the marker of choice  troponin. Many other potential cardiac biomarkers are still being researched but their clinical utility has yet to be established.  Note: Cardiac biomarkers are not the same tests as those that are used to screen the general healthy population for their risk of developing heart disease. Those can be found under Cardiac Risk Assessment. LABORATORY TESTS CURRENT CARDIAC BIOMARKERS   CK and CK-MB  BNP or (NT-proBNP)  Troponin  Myoglobin (not always used; sometimes ordered with troponin) MORE GENERAL TESTS FREQUENTLY ORDERED ALONG WITH CARDIAC BIOMARKERS   Blood Gases  CMP  BMP  Electrolytes  CBC ON THE HORIZON Ischemia modified albumin (IMA)  Test has received FDA approval for use with troponin and electrocardiogram to rule  out acute coronary syndrome (ACS) in patients with chest pain. May become useful for identifying patients at higher risk of heart attack and potentially could replace myoglobin one day.  NON-LABORATORY TESTS These tests allow caregivers to look at the size, shape, and function of the heart as it is beating. They can be used to detect changes to the rhythm of the heart as well as to detect and evaluate damaged tissues and blocked arteries.   EKG (ECG, electrocardiogram)  Coronary angiography (or arteriography)  Stress testing  Nuclear scan  ECG (echocardiogram)  Chest X-ray THE FOLLOWING SUMMARIZES CURRENTLY USED CARDIAC BIOMARKERS. Marker: CK  What: Enzyme  that exists in three different isoforms  Where Found: Heart, brain, and skeletal muscle  What Indicates: Injury to muscle cells  Time to Increase: 4 to 6 hours after injury, peaks in 18 to 24 hours  Time back to Normal: Normal in 48 to 72 hours, unless due to continuing injury  When/How Used: Being phased out, may be ordered prior to CK-MB Marker: CK-MB  What: Heart- related portion of total CK enzyme  Where Found: Heart primarily, but also in skeletal muscle  What Indicates: Injury (cell death) to heart  Time to Increase: 4 to 6 hrs after heart attack, peaks in 12 to 20 hours  Time back to Normal: Returns to normal in 24 to 48 hours unless new/continual damage  When/How Used: Not as specific as Troponin for heart injury/attack, may be ordered when Troponin is not available, may be ordered to monitor new/continuing damage Marker: Myoglobin  What: Small oxygen-storing protein  Where Found: Heart and other muscle cells  What Indicates: Injury to heart or other muscle cells. Also elevated with kidney problems.  Time to Increase: Starts to rise within 2 to 3 hours, peaks in 8 to 12 hours.  Time back to Normal: Falls back to normal by about one day after injury occurred  When/How Used: Ordered along with Troponin, helps diagnose heart injury/attack Marker: Cardiac Troponin  What: Components of a Regulatory protein complex. Two cardiac specific isoforms: T and I  Where Found: Heart muscle  What Indicates: Heart injury/damage  Time to Increase: 4 to 8 hours  Time back to Normal: Remains elevated for 7 to 14 days  When/How Used: Ordered to help assess prognosis and diagnose heart attack Marker: LDH  What: Enzyme  Where Found: Almost all body tissues  What Indicates: General marker of injury to cells  When/How Used: Phased out, not specific Marker: AST  What: Enzyme  Where Found: Almost all body tissues  What Indicates: General marker of injury to  cells  When/How Used: Phased out, not specific Marker: Hs-CRP  What: Protein  Where Found: Associated with athero-sclerosis  What Indicates: Inflammatory process  Time back to Normal: Elevated with inflammation  When/How Used: May help determine prognosis of patients who have had heart attack Marker: BNP  What: Hormone  Where Found: Heart's left ventricle  What Indicates: Heart failure  Time back to Normal: Elevation related to severity  When/How Used: Help diagnose and evaluate heart failure, prognosis, and to monitor therapy Document Released: 10/02/2004 Document Revised: 12/02/2011 Document Reviewed: 06/19/2005 [facility] Patient Information 2014 [place], [place].

## 2013-12-22 NOTE — ED Notes (Signed)
Nitro removed from patient's chest.

## 2013-12-29 ENCOUNTER — Encounter: Payer: Self-pay | Admitting: *Deleted

## 2014-01-07 ENCOUNTER — Ambulatory Visit: Payer: Medicare Other | Admitting: Nurse Practitioner

## 2014-01-12 ENCOUNTER — Ambulatory Visit (INDEPENDENT_AMBULATORY_CARE_PROVIDER_SITE_OTHER): Payer: Medicare Other | Admitting: Nurse Practitioner

## 2014-01-12 ENCOUNTER — Encounter: Payer: Self-pay | Admitting: Nurse Practitioner

## 2014-01-12 VITALS — BP 130/80 | HR 78 | Ht 62.0 in | Wt 190.0 lb

## 2014-01-12 DIAGNOSIS — R079 Chest pain, unspecified: Secondary | ICD-10-CM

## 2014-01-12 DIAGNOSIS — Z87891 Personal history of nicotine dependence: Secondary | ICD-10-CM

## 2014-01-12 DIAGNOSIS — E785 Hyperlipidemia, unspecified: Secondary | ICD-10-CM

## 2014-01-12 DIAGNOSIS — I10 Essential (primary) hypertension: Secondary | ICD-10-CM

## 2014-01-12 NOTE — Progress Notes (Signed)
Shelley Spencer Date of Birth: 04-07-1946 Medical Record #[id number]  History of Present Illness: Shelley Spencer is seen back today for a work in visit. Seen for Dr. Percival Spanish. I took care of her husband "Shelley Spencer" prior to his transplant many years ago. She has no known CAD. Negative Myoview in 2013. Has GERD, HTN and HLD.   Last seen here in 2013 due to atypical chest pain with CV risk factors. Lexiscan Myoview obtained. This was felt to be low risk.  In the ER at the beginning of this April with chest/jaw pain. Referred back here for further evaluation. Troponins negative x 2. CXR negative.  Review of EKG from the ER shows inferior Q's and poor R wave progression with sinus rhythm.   Comes in today. Here alone. Doing well. No more chest pain. She describes spells of having midsternal chest pain that radiates to her jaw. This last spell lasted about 20 minutes and due to the longer duration, she was referred to the ER. Has had several spells over the past few months. This is different than what she had when she saw Dr. Percival Spanish in 2013. Not short of breath. Did feel sweaty. Not dizzy. Energy level pretty good as a baseline.   Current Outpatient Prescriptions  Medication Sig Dispense Refill  . albuterol (PROVENTIL HFA;VENTOLIN HFA) 108 (90 BASE) MCG/ACT inhaler Inhale 2 puffs into the lungs every 6 (six) hours as needed for shortness of breath.       Marland Kitchen aspirin EC 81 MG tablet Take 81 mg by mouth daily.      Marland Kitchen BIOTIN PO Take 1 tablet by mouth daily.      . budesonide-formoterol (SYMBICORT) 160-4.5 MCG/ACT inhaler Inhale 2 puffs into the lungs daily.       . cetirizine (ZYRTEC) 10 MG tablet Take 10 mg by mouth daily.      . cholecalciferol (VITAMIN D) 1000 UNITS tablet Take 1,000 Units by mouth daily.      Marland Kitchen doxazosin (CARDURA) 2 MG tablet Take 2 mg by mouth daily.      Marland Kitchen estradiol (ESTRACE) 1 MG tablet Take 1 mg by mouth daily.      . fluticasone (FLONASE) 50 MCG/ACT nasal spray Place 2 sprays into both  nostrils daily.      . furosemide (LASIX) 20 MG tablet Take 20 mg by mouth daily.      Marland Kitchen losartan (COZAAR) 100 MG tablet Take 100 mg by mouth daily.      . Multiple Vitamins-Minerals (MULTIVITAMIN PO) Take 1 tablet by mouth daily.      . niacin (NIASPAN) 1000 MG CR tablet Take 1,000 mg by mouth at bedtime.      . pantoprazole (PROTONIX) 40 MG tablet Take 40 mg by mouth daily.      Marland Kitchen PARoxetine (PAXIL) 40 MG tablet Take 40 mg by mouth every morning.      . potassium chloride SA (K-DUR,KLOR-CON) 20 MEQ tablet Take 20 mEq by mouth daily.      . traZODone (DESYREL) 150 MG tablet Take 150 mg by mouth at bedtime.      . vitamin B-12 (CYANOCOBALAMIN) 1000 MCG tablet Take 1,000 mcg by mouth daily.       No current facility-administered medications for this visit.    Allergies  Allergen Reactions  . Statins     Past Medical History  Diagnosis Date  . Hyperlipidemia   . Hypertension   . Asthma   . GERD (gastroesophageal reflux disease)   .  DJD (degenerative joint disease)     L-Spine  . Hematuria   . Fibroids   . Retinal tear 2005    Small right retinal tear  . Shingles 1980's    on her waist  . Colon polyp   . Depression   . Anxiety   . Bicornuate uterus     double cervix  . HNP (herniated nucleus pulposus)     lower back  . Deviated septum   . MVP (mitral valve prolapse)     Past Surgical History  Procedure Laterality Date  . Spinal fusion    . Finger surgery      left third finger mucoid cyst removed  . Hysteroscopy      D&C PMB Endo Cx Polyp  . Laparoscopic hysterectomy  7/209/10    R-TLH/BSO  Fibroids, BAck Pain, Adenomyosis  . Bone spur surgery      x 3  . Tubal ligation    . Removal vaginal septum      History  Smoking status  . Former Smoker -- 1.00 packs/day for 20 years  . Types: Cigarettes  . Quit date: 09/23/1978  Smokeless tobacco  . Never Used    History  Alcohol Use  . 0.5 oz/week  . 1 drink(s) per week    Comment: occ alcohol    Family  History  Problem Relation Age of Onset  . Heart failure Father     Possibly related to EtOH  . Alcohol abuse Father   . Emphysema Father   . Emphysema Mother   . Heart failure Mother     Possibly related to EtOH  . Diabetes Mother   . Alcohol abuse Mother   . Valvular heart disease Brother   . Alcohol abuse Mother   . Bipolar disorder Brother   . Aortic aneurysm Brother   . Heart disease Brother     rheumatic heart disease    Review of Systems: The review of systems is per the HPI.  All other systems were reviewed and are negative.  Physical Exam: BP 130/80  Pulse 78  Ht 5\' 2"  (1.575 m)  Wt 190 lb (86.183 kg)  BMI 34.74 kg/m2  SpO2 97% Patient is very pleasant and in no acute distress. She is obese. Skin is warm and dry. Color is normal.  HEENT is unremarkable. Normocephalic/atraumatic. PERRL. Sclera are nonicteric. Neck is supple. No masses. No JVD. Lungs are clear. Cardiac exam shows a regular rate and rhythm. Abdomen is soft. Extremities are without edema. Gait and ROM are intact. No gross neurologic deficits noted.   LABORATORY DATA: EKG today shows inferior Q's, poor R wave progression.   Lab Results  Component Value Date   WBC 7.3 12/22/2013   HGB 13.5 12/22/2013   HCT 40.6 12/22/2013   PLT 230 12/22/2013   GLUCOSE 109* 12/22/2013   ALT 22 04/07/2009   AST 17 04/07/2009   NA 141 12/22/2013   K 3.7 12/22/2013   CL 102 12/22/2013   CREATININE 1.00 12/22/2013   BUN 11 12/22/2013   CO2 24 12/22/2013   Dg Chest 2 View  12/22/2013   IMPRESSION: No abnormality noted.   Electronically Signed   By: Lowella Grip M.D.   On: 12/22/2013 15:24     Myoview Impression from 2013  Exercise Capacity: [place] with low level exercise.  BP Response: Normal blood pressure response.  Clinical Symptoms: Chest pressure.  ECG Impression: No significant ST segment change suggestive of ischemia.  Comparison with  Prior Nuclear Study: No previous nuclear study performed  Overall Impression: Low risk  stress nuclear study. Small fixed apical perfusion defect may be artifact due to apical thinning. Normal wall motion and EF.  Dalton McLean     Assessment / Plan:  1. Chest pain - has multiple CV risk factors, resting EKG abnormal - would like to update her Myoview. She does not exercise regularly - will arrange for Lexiscan.  Tentatively see back prn unless her study is abnormal.   2. HTN - BP good.   3. HLD - does not tolerate statin - on Niaspan only - will defer to her PCP.  4. Remote smoker  Patient is agreeable to this plan and will call if any problems develop in the interim.   Burtis Junes, RN, [facility] [address] [place], [place]  [address] [phone]

## 2014-01-12 NOTE — Patient Instructions (Signed)
We will arrange for a Lexiscan  I will be happy to see you back as needed  Walk!!!  Call the [facility] office at [phone] if you have any questions, problems or concerns.

## 2014-01-25 ENCOUNTER — Ambulatory Visit (INDEPENDENT_AMBULATORY_CARE_PROVIDER_SITE_OTHER): Payer: Medicare Other | Admitting: Internal Medicine

## 2014-01-25 ENCOUNTER — Encounter: Payer: Self-pay | Admitting: Internal Medicine

## 2014-01-25 VITALS — BP 102/70 | HR 96 | Ht 62.0 in | Wt 188.2 lb

## 2014-01-25 DIAGNOSIS — R079 Chest pain, unspecified: Secondary | ICD-10-CM

## 2014-01-25 DIAGNOSIS — K219 Gastro-esophageal reflux disease without esophagitis: Secondary | ICD-10-CM

## 2014-01-25 NOTE — Patient Instructions (Signed)
You have been scheduled for an endoscopy with propofol. Please follow written instructions given to you at your visit today. If you use inhalers (even only as needed), please bring them with you on the day of your procedure. Your physician has requested that you go to www.startemmi.com and enter the access code given to you at your visit today. This web site gives a general overview about your procedure. However, you should still follow specific instructions given to you by our office regarding your preparation for the procedure.  Please continue your Protonix 40 mg at night.  Please purchase the following medications over the counter and take as directed: Ranitidine 150 mg tablet OR Gaviscon-2 tablets for breakthrough heartburn  CC:Dr [employer]

## 2014-01-25 NOTE — Progress Notes (Signed)
Shelley Spencer 1946/09/05 [id number]  Note: This dictation was prepared with Dragon digital system. Any transcriptional errors that result from this procedure are unintentional.   History of Present Illness:  This is a 68 year old white female with recent acute chest pain evaluated by cardiology. She has a pending heart scan ordered by Truitt Merle, PA. She has a long history of gastroesophageal reflux for which she has been on Protonix 40 mg at bedtime. She often wakes up at night with coughing and heartburn. She also has a history of asthmatic bronchitis for which she takes inhalers. She sleeps flat at night because of cervical spine problems and back problems. Occasionally, she sleeps in a recliner when her reflux gets worse. She denies dysphagia or odynophagia. We have seen her in the past for a screening colonoscopy in December 2010 and before that in 2002 when she had an adenomatous and hyperplastic polyp. The last colonoscopy showed only polypoid mucosa. She will be due for a recall colonoscopy in 2020.     Past Medical History  Diagnosis Date  . Hyperlipidemia   . Hypertension   . Asthma   . GERD (gastroesophageal reflux disease)   . DJD (degenerative joint disease)     L-Spine  . Hematuria   . Fibroids   . Retinal tear 2005    Small right retinal tear  . Shingles 1980's    on her waist  . Colon polyp   . Depression   . Anxiety   . Bicornuate uterus     double cervix  . HNP (herniated nucleus pulposus)     lower back  . Deviated septum   . MVP (mitral valve prolapse)   . Anemia   . Arthritis   . Pre-diabetes     Past Surgical History  Procedure Laterality Date  . Lumbar fusion    . Finger surgery Left     left third finger mucoid cyst removed  . Hysteroscopy      D&C PMB Endo Cx Polyp  . Laparoscopic hysterectomy  7/209/10    R-TLH/BSO  Fibroids, BAck Pain, Adenomyosis  . Bone spur surgery Bilateral     x 3  . Tubal ligation    . Removal vaginal septum    .  Foot neuroma surgery Bilateral     Allergies  Allergen Reactions  . Statins     Leg pains    Family history and social history have been reviewed.  Review of Systems: History of reflux burning chest pain  The remainder of the 10 point ROS is negative except as outlined in the H&P  Physical Exam: General Appearance Well developed, in no distress Eyes  Non icteric  HEENT  Non traumatic, normocephalic  Mouth No lesion, tongue papillated, no cheilosis Neck Supple without adenopathy, thyroid not enlarged, no carotid bruits, no JVD Lungs Clear to auscultation bilaterally, nontender sternum COR Normal S1, normal S2, regular rhythm, no murmur, quiet precordium Abdomen soft nontender with normoactive bowel sounds. Liver edge at costal margin  Rectal not done Extremities  No pedal edema Skin No lesions Neurological Alert and oriented x 3 Psychological Normal mood and affect  Assessment and Plan:   Problem #1 Noncardiac chest pain. She still has a pending cardiac scan which will be done in the next 2 weeks. She has breakthrough gastroesophageal reflux despite taking Protonix 40 mg at bedtime. I have advised her to try to elevate the head of her bed at night and take ranitidine 150 mg for breakthrough  symptoms as well as Gaviscon 2 tablets every 4 hours when necessary for breakthrough symptoms. We will schedule an upper endoscopy to rule out Barrett's esophagus, hiatal hernia or esophagitis.  Problem #2 Colorectal screening. She has symptoms of irritable bowel syndrome. She is up-to-date on her colonoscopy. Her recall was for 10 years.    Lafayette Dragon 01/25/2014

## 2014-02-02 ENCOUNTER — Encounter: Payer: Self-pay | Admitting: Internal Medicine

## 2014-02-08 ENCOUNTER — Ambulatory Visit (HOSPITAL_COMMUNITY): Payer: Medicare Other | Attending: Cardiovascular Disease | Admitting: Radiology

## 2014-02-08 VITALS — BP 142/78 | HR 76 | Ht 62.0 in | Wt 190.0 lb

## 2014-02-08 DIAGNOSIS — E785 Hyperlipidemia, unspecified: Secondary | ICD-10-CM

## 2014-02-08 DIAGNOSIS — Z87891 Personal history of nicotine dependence: Secondary | ICD-10-CM

## 2014-02-08 DIAGNOSIS — I10 Essential (primary) hypertension: Secondary | ICD-10-CM

## 2014-02-08 DIAGNOSIS — R079 Chest pain, unspecified: Secondary | ICD-10-CM | POA: Diagnosis present

## 2014-02-08 HISTORY — PX: CARDIOVASCULAR STRESS TEST: SHX262

## 2014-02-08 MED ORDER — REGADENOSON 0.4 MG/5ML IV SOLN
0.4000 mg | Freq: Once | INTRAVENOUS | Status: AC
Start: 2014-02-08 — End: 2014-02-08
  Administered 2014-02-08: 0.4 mg via INTRAVENOUS

## 2014-02-08 MED ORDER — TECHNETIUM TC 99M SESTAMIBI GENERIC - CARDIOLITE
11.0000 | Freq: Once | INTRAVENOUS | Status: AC | PRN
  Administered 2014-02-08: 11 via INTRAVENOUS

## 2014-02-08 MED ORDER — TECHNETIUM TC 99M SESTAMIBI GENERIC - CARDIOLITE
33.0000 | Freq: Once | INTRAVENOUS | Status: AC | PRN
  Administered 2014-02-08: 33 via INTRAVENOUS

## 2014-02-08 NOTE — Progress Notes (Signed)
[facility] [address] [place],  [address] [phone]    Cardiology Nuclear Med Study  Shelley Spencer is a 68 y.o. female     MRN : [id number]     DOB: [birth year]  Procedure Date: 02/08/2014  Nuclear Med Background Indication for Stress Test:  Evaluation for Ischemia, and Patient seen in hospital on 12-22-2013 for Chest pain and jaw pain, enzymes negative History:  No known CAD, Echo, MPI 2013 (normal) EF 62%, Asthma Cardiac Risk Factors: History of Smoking, Hypertension and Lipids  Symptoms:  Chest Pain (last date of chest discomfort was April 1st) and Diaphoresis   Nuclear Pre-Procedure Caffeine/Decaff Intake:  None> 12 hrs NPO After: 9:00pm   Lungs:  clear O2 Sat: 91% on room air. IV 0.9% NS with Angio Cath:  22g  IV Site: R Hand x 1, tolerated well IV Started by:  Irven Baltimore, RN  Chest Size (in):  38 Cup Size: DD  Height: 5\' 2"  (1.575 m)  Weight:  190 lb (86.183 kg)  BMI:  Body mass index is 34.74 kg/(m^2). Tech Comments:  Cozaar this am. Irven Baltimore, RN.    Nuclear Med Study 1 or 2 day study: 1 day  Stress Test Type:  Treadmill/Lexiscan  Reading MD: N/A  Order Authorizing Provider:  Minus Breeding, MD, and Truitt Merle, NP  Resting Radionuclide: Technetium 9m Sestamibi  Resting Radionuclide Dose: 11.0 mCi   Stress Radionuclide:  Technetium 43m Sestamibi  Stress Radionuclide Dose: 33.0 mCi           Stress Protocol Rest HR: 76 Stress HR: 103  Rest BP: 142/78 Stress BP: 132/57  Exercise Time (min): n/a METS: n/a           Dose of Adenosine (mg):  n/a Dose of Lexiscan: 0.4 mg  Dose of Atropine (mg): n/a Dose of Dobutamine: n/a mcg/kg/min (at max HR)  Stress Test Technologist: Glade Lloyd, BS-ES  Nuclear Technologist:  Charlton Amor, CNMT     Rest Procedure:  Myocardial perfusion imaging was performed at rest 45 minutes following the intravenous administration of Technetium 49m Sestamibi. Rest ECG: Sinus rhythm,  72, poor R wave progression.  Stress Procedure:  The patient received IV Lexiscan 0.4 mg over 15-seconds with concurrent low level exercise and then Technetium 61m Sestamibi was injected at 30-seconds while the patient continued walking one more minute.  Quantitative spect images were obtained after a 45-minute delay.  During the infusion of Lexiscan, the patient compalined of chest tightness, stomach upset, headache and SOB.  These symptoms began to resolve in recovery.  Stress ECG: Wandering baseline artifact, no significant change from baseline.  QPS Raw Data Images:  Mild breast attenuation Stress Images:  There is subtle decreased uptake in the apical/apical lateral distribution seen at both rest and stress. No ischemia identified. Otherwise, homogeneous radiotracer uptake. Rest Images:  As above Subtraction (SDS):  0 Transient Ischemic Dilatation (Normal <1.22):  1.04 Lung/Heart Ratio (Normal <0.45):  0.28  Quantitative Gated Spect Images QGS EDV:  91 ml QGS ESV:  35 ml  Impression Exercise Capacity:  Lexiscan with low level exercise. BP Response:  Normal Clinical Symptoms:  Typical symptoms with pharmacologic agent, chest tightness, stomach upset, headache the ECG Impression:  No significant ST segment change suggestive of ischemia. Comparison with Prior Nuclear Study: No images to compare  Overall Impression:  Low risk stress nuclear study no areas of ischemia identified.  LV Ejection Fraction: 61%.  LV Wall Motion:  NL LV Function; NL Wall Motion  Candee Furbish, MD

## 2014-02-09 ENCOUNTER — Ambulatory Visit (AMBULATORY_SURGERY_CENTER): Payer: Medicare Other | Admitting: Internal Medicine

## 2014-02-09 ENCOUNTER — Encounter: Payer: Self-pay | Admitting: Internal Medicine

## 2014-02-09 VITALS — BP 155/78 | HR 62 | Temp 98.8°F | Resp 19 | Ht 62.0 in | Wt 190.0 lb

## 2014-02-09 DIAGNOSIS — K219 Gastro-esophageal reflux disease without esophagitis: Secondary | ICD-10-CM

## 2014-02-09 DIAGNOSIS — K2100 Gastro-esophageal reflux disease with esophagitis, without bleeding: Secondary | ICD-10-CM

## 2014-02-09 DIAGNOSIS — K209 Esophagitis, unspecified without bleeding: Secondary | ICD-10-CM

## 2014-02-09 DIAGNOSIS — K2970 Gastritis, unspecified, without bleeding: Secondary | ICD-10-CM

## 2014-02-09 DIAGNOSIS — K2990 Gastroduodenitis, unspecified, without bleeding: Secondary | ICD-10-CM

## 2014-02-09 MED ORDER — SODIUM CHLORIDE 0.9 % IV SOLN: 500.0000 mL | INTRAVENOUS | Status: DC

## 2014-02-09 MED ORDER — PANTOPRAZOLE SODIUM 40 MG PO TBEC: 40.0000 mg | DELAYED_RELEASE_TABLET | Freq: Two times a day (BID) | ORAL | Status: DC

## 2014-02-09 NOTE — Op Note (Signed)
[facility]  [employer] [place], [address]   ENDOSCOPY PROCEDURE REPORT  PATIENT: Shelley, Spencer  MR#: [id number] BIRTHDATE: April 05, 1946 , 25  yrs. old GENDER: Female ENDOSCOPIST: Lafayette Dragon, MD REFERRED BY:  Crist Infante, M.D. PROCEDURE DATE:  02/09/2014 PROCEDURE:  EGD w/ biopsy ASA CLASS:     Class II INDICATIONS:  Chest pain.   Epigastric pain.   noncardiac chest pain.  Cardiolite scan shows lower risk for cardiac event.  The ejection fraction 61%. MEDICATIONS: MAC sedation, administered by CRNA and propofol (Diprivan) 150mg  IV TOPICAL ANESTHETIC: none  DESCRIPTION OF PROCEDURE: After the risks benefits and alternatives of the procedure were thoroughly explained, informed consent was obtained.  The LB [id number] endoscope was introduced through the mouth and advanced to the second portion of the duodenum. Without limitations.  The instrument was slowly withdrawn as the mucosa was fully examined.      Esophagus: Proximal and mid esophageal mucosa appeared normal. There was prolapse of the gastric mucosa intermittently into the distal esophagus [place] patient and coughing and belching. There was a reducible 3 cm hiatal hernia distal to the Z line which was located at the 32 cm from the incisors. There was a 1 linear erosion in the GE junction which measured the about 12 mm. Biopsies were taken from the GE junction to rule out Barrett's esophagus Stomach there are multiple Cameron erosions in the gastric cardia. At least 4 long linear erosions were biopsied to rule out H. pylori. Gastric antrum and pyloric outlet was normal. Retroflexion of the endoscope confirmed presence of hiatal hernia and Cameron erosions Duodenum duodenal bulb and descending duodenum was normal[ The scope was then withdrawn from the patient and the procedure completed.  COMPLICATIONS: There were no complications. ENDOSCOPIC IMPRESSION: grade a esophagitis status post  biopsies 3 cm hiatal hernia with Lysbeth Galas erosions Intermittent prolapse of gastric mucosa in the esophagus may explain the patient's intermittent chest pain RECOMMENDATIONS: 1.  Await pathology results 2.  Anti-reflux regimen to be follow 3.  Continue PPI 4.  aggressive antireflux regimen including acid reducing agents. Head of the bed elevation.  A suitable father evaluation with barium esophagram to assess the Nissen fundoplication indicated  REPEAT EXAM: for EGD pending biopsy results.  eSigned:  Lafayette Dragon, MD 02/09/2014 11:48 AM   CC:  PATIENT NAME:  Shelley, Spencer MR#: [id number]

## 2014-02-09 NOTE — Progress Notes (Signed)
Pt was seen in ED in [place] 2015 for chest pain, EKG showed infarct but tropinin was negative, pt was seen by cardiologist ordered stress test, pt had stress test on 02/08/14 no results yet, pt has not had any further chest pains, Dr Olevia Perches saw pt on 01/25/14 with notes that pt was having stress test, advised CRNA Josh of pt hx, CRNA questioned whether pt should be done, spoke with Dr Garfield Cornea states she will review pt's chart-adm

## 2014-02-09 NOTE — Patient Instructions (Signed)
Discharge instructions given with verbal  Understanding. Handouts on esophagitis and a hiatal hernia. Resume previous medications. YOU HAD AN ENDOSCOPIC PROCEDURE TODAY AT [facility]: Refer to the procedure report that was given to you for any specific questions about what was found during the examination.  If the procedure report does not answer your questions, please call your gastroenterologist to clarify.  If you requested that your care partner not be given the details of your procedure findings, then the procedure report has been included in a sealed envelope for you to review at your convenience later.  YOU SHOULD EXPECT: Some feelings of bloating in the abdomen. Passage of more gas than usual.  Walking can help get rid of the air that was put into your GI tract during the procedure and reduce the bloating. If you had a lower endoscopy (such as a colonoscopy or flexible sigmoidoscopy) you may notice spotting of blood in your stool or on the toilet paper. If you underwent a bowel prep for your procedure, then you may not have a normal bowel movement for a few days.  DIET: Your first meal following the procedure should be a light meal and then it is ok to progress to your normal diet.  A half-sandwich or bowl of soup is an example of a good first meal.  Heavy or fried foods are harder to digest and may make you feel nauseous or bloated.  Likewise meals heavy in dairy and vegetables can cause extra gas to form and this can also increase the bloating.  Drink plenty of fluids but you should avoid alcoholic beverages for 24 hours.  ACTIVITY: Your care partner should take you home directly after the procedure.  You should plan to take it easy, moving slowly for the rest of the day.  You can resume normal activity the day after the procedure however you should NOT DRIVE or use heavy machinery for 24 hours (because of the sedation medicines used during the test).    SYMPTOMS TO REPORT  IMMEDIATELY: A gastroenterologist can be reached at any hour.  During normal business hours, 8:30 AM to 5:00 PM Monday through Friday, call [phone].  After hours and on weekends, please call the GI answering service at [phone] who will take a message and have the physician on call contact you.   Following upper endoscopy (EGD)  Vomiting of blood or coffee ground material  New chest pain or pain under the shoulder blades  Painful or persistently difficult swallowing  New shortness of breath  Fever of 100F or higher  Black, tarry-looking stools  FOLLOW UP: If any biopsies were taken you will be contacted by phone or by letter within the next 1-3 weeks.  Call your gastroenterologist if you have not heard about the biopsies in 3 weeks.  Our staff will call the home number listed on your records the next business day following your procedure to check on you and address any questions or concerns that you may have at that time regarding the information given to you following your procedure. This is a courtesy call and so if there is no answer at the home number and we have not heard from you through the emergency physician on call, we will assume that you have returned to your regular daily activities without incident.  SIGNATURES/CONFIDENTIALITY: You and/or your care partner have signed paperwork which will be entered into your electronic medical record.  These signatures attest to the fact that that  the information above on your After Visit Summary has been reviewed and is understood.  Full responsibility of the confidentiality of this discharge information lies with you and/or your care-partner.

## 2014-02-09 NOTE — Progress Notes (Signed)
Called to room to assist during endoscopic procedure.  Patient ID and intended procedure confirmed with present staff. Received instructions for my participation in the procedure from the performing physician.  

## 2014-02-10 ENCOUNTER — Telehealth: Payer: Self-pay | Admitting: *Deleted

## 2014-02-10 NOTE — Telephone Encounter (Signed)
  Follow up Call-  Call back number 02/09/2014  Post procedure Call Back phone  # [phone]  Permission to leave phone message Yes     Patient questions:  Do you have a fever, pain , or abdominal swelling? no Pain Score  0 *  Have you tolerated food without any problems? yes  Have you been able to return to your normal activities? yes  Do you have any questions about your discharge instructions: Diet   no Medications  no Follow up visit  no  Do you have questions or concerns about your Care? no  Actions: * If pain score is 4 or above: No action needed, pain <4.

## 2014-02-11 ENCOUNTER — Telehealth: Payer: Self-pay | Admitting: Nurse Practitioner

## 2014-02-11 NOTE — Telephone Encounter (Signed)
**Note De-Identified Jayleena Stille Obfuscation** The pt is advised of her normal myoview per Truitt Merle, NP. She verbalized understanding.

## 2014-02-11 NOTE — Telephone Encounter (Signed)
New message     Returning a Shelley Spencer's call

## 2014-02-15 ENCOUNTER — Encounter: Payer: Self-pay | Admitting: Internal Medicine

## 2014-02-16 ENCOUNTER — Encounter: Payer: Self-pay | Admitting: *Deleted

## 2014-02-21 ENCOUNTER — Telehealth: Payer: Self-pay | Admitting: Obstetrics and Gynecology

## 2014-02-21 NOTE — Telephone Encounter (Signed)
Calling to confirm pts appt °

## 2014-02-25 ENCOUNTER — Ambulatory Visit: Payer: 59 | Admitting: Obstetrics and Gynecology

## 2014-02-25 ENCOUNTER — Encounter: Payer: Self-pay | Admitting: Obstetrics and Gynecology

## 2014-02-25 ENCOUNTER — Ambulatory Visit (INDEPENDENT_AMBULATORY_CARE_PROVIDER_SITE_OTHER): Payer: 59 | Admitting: Obstetrics and Gynecology

## 2014-02-25 VITALS — BP 120/74 | HR 70 | Ht 62.0 in | Wt 191.0 lb

## 2014-02-25 DIAGNOSIS — Z01419 Encounter for gynecological examination (general) (routine) without abnormal findings: Secondary | ICD-10-CM

## 2014-02-25 MED ORDER — ESTRADIOL 0.5 MG PO TABS: 0.5000 mg | ORAL_TABLET | Freq: Every day | ORAL | Status: DC

## 2014-02-25 MED ORDER — ESTRADIOL 0.5 MG PO TABS: 1.0000 mg | ORAL_TABLET | Freq: Every day | ORAL | Status: DC

## 2014-02-25 NOTE — Progress Notes (Signed)
Patient ID: Shelley Spencer, female   DOB: 18-Oct-1945, 68 y.o.   MRN: [id number] GYNECOLOGY VISIT  PCP:   Crist Infante, MD  Referring provider:   HPI: 68 y.o.   Married  Caucasian  female   [phone] with No LMP recorded. Patient has had a hysterectomy.   here for  AEX. Taking Estradiol for mood swings. When stopped had sleep disturbance, so restarted it.   Had a chest pain work up in April. Diagnosed with hiatal hernia.   Husband has had a heart transplant at [facility] 8 years ago.   Hgb:   PCP Urine:  ---  GYNECOLOGIC HISTORY: No LMP recorded. Patient has had a hysterectomy. Sexually active:  no Partner preference: female Contraception: hysterectomy   Menopausal hormone therapy: Estradiol 1mg  DES exposure: no   Blood transfusions:   no Sexually transmitted diseases:  no  GYN procedures and prior surgeries:  Tubal ligation, Hysterectomy, removal of vaginal septum, hysteroscopy Last mammogram:  3D done at [facility] - Normal per patient.         Last pap and high risk HPV testing: 11/09 wnl   History of abnormal pap smear:  no   OB History   Grav Para Term Preterm Abortions TAB SAB Ect Mult Living   3 2   1  1   2        LIFESTYLE: Exercise:   no            Tobacco:   no Alcohol:     1 drink a week Drug use:  no  OTHER HEALTH MAINTENANCE: Tetanus/TDap:   Up to date with PCP Gardisil:              n/a Influenza:              06/2013 Zostavax:             Completed with PCP  Bone density:         2015 with Dr. Perini:normal. Colonoscopy:         09-05-09 small polyp with Dr. Olevia Perches.  Repeat colonoscopy 7 years.  Cholesterol check:   wnl with meds  Family History  Problem Relation Age of Onset  . Heart failure Father     Possibly related to EtOH  . Alcohol abuse Father   . Emphysema Father   . Emphysema Mother   . Heart failure Mother     Possibly related to EtOH  . Diabetes Mother   . Valvular heart disease Brother   . Alcohol abuse Mother   . Bipolar disorder Brother    . Aortic aneurysm Brother   . Heart disease Brother     rheumatic heart disease  . Crohn's disease Daughter   . Thyroid disease Daughter     Patient Active Problem List   Diagnosis Date Noted  . Chest pain 10/31/2011  . Overweight 10/31/2011  . Hyperlipidemia   . Hypertension    Past Medical History  Diagnosis Date  . Hyperlipidemia   . Hypertension   . Asthma   . GERD (gastroesophageal reflux disease)   . DJD (degenerative joint disease)     L-Spine  . Hematuria   . Fibroids   . Retinal tear 2005    Small right retinal tear  . Shingles 1980's    on her waist  . Colon polyp   . Depression   . Anxiety   . Bicornuate uterus     double cervix  . HNP (herniated nucleus  pulposus)     lower back  . Deviated septum   . MVP (mitral valve prolapse)   . Anemia   . Arthritis   . Pre-diabetes   . Hiatal hernia     Past Surgical History  Procedure Laterality Date  . Lumbar fusion    . Finger surgery Left     left third finger mucoid cyst removed  . Hysteroscopy      D&C PMB Endo Cx Polyp  . Laparoscopic hysterectomy  7/209/10    R-TLH/BSO  Fibroids, BAck Pain, Adenomyosis  . Bone spur surgery Bilateral     x 3  . Tubal ligation    . Removal vaginal septum    . Foot neuroma surgery Bilateral   . Cardiovascular stress test  02/08/2014    ALLERGIES: Amlodipine; Fenofibrate; Omeprazole; Simvastatin; Statins; and Singulair  Current Outpatient Prescriptions  Medication Sig Dispense Refill  . albuterol (PROVENTIL HFA;VENTOLIN HFA) 108 (90 BASE) MCG/ACT inhaler Inhale 2 puffs into the lungs every 6 (six) hours as needed for shortness of breath.       Marland Kitchen aspirin EC 81 MG tablet Take 81 mg by mouth daily.      Marland Kitchen BIOTIN PO Take 1 tablet by mouth daily.      . budesonide-formoterol (SYMBICORT) 160-4.5 MCG/ACT inhaler Inhale 2 puffs into the lungs daily.       . cetirizine (ZYRTEC) 10 MG tablet Take 10 mg by mouth daily.      . cholecalciferol (VITAMIN D) 1000 UNITS tablet  Take 1,000 Units by mouth daily.      Marland Kitchen doxazosin (CARDURA) 2 MG tablet Take 2 mg by mouth daily.      Marland Kitchen estradiol (ESTRACE) 1 MG tablet Take 1 mg by mouth daily.      . ferrous sulfate 325 (65 FE) MG tablet Take 325 mg by mouth daily with breakfast.      . fluticasone (FLONASE) 50 MCG/ACT nasal spray Place 2 sprays into both nostrils daily.      . furosemide (LASIX) 20 MG tablet Take 20 mg by mouth daily.      Marland Kitchen losartan (COZAAR) 100 MG tablet Take 100 mg by mouth daily.      . Multiple Vitamins-Minerals (MULTIVITAMIN PO) Take 1 tablet by mouth daily.      . niacin (NIASPAN) 1000 MG CR tablet Take 1,000 mg by mouth at bedtime.      . pantoprazole (PROTONIX) 40 MG tablet Take 1 tablet (40 mg total) by mouth 2 (two) times daily.  180 tablet  3  . PARoxetine (PAXIL) 40 MG tablet Take 40 mg by mouth every morning.      . potassium chloride SA (K-DUR,KLOR-CON) 20 MEQ tablet Take 20 mEq by mouth daily.      . traZODone (DESYREL) 150 MG tablet Take 150 mg by mouth at bedtime.      . vitamin B-12 (CYANOCOBALAMIN) 1000 MCG tablet Take 1,000 mcg by mouth daily.       No current facility-administered medications for this visit.     ROS:  Pertinent items are noted in HPI.  SOCIAL HISTORY:  Retired.   PHYSICAL EXAMINATION:    BP 120/74  Pulse 70  Ht 5\' 2"  (1.575 m)  Wt 191 lb (86.637 kg)  BMI 34.93 kg/m2   Wt Readings from Last 3 Encounters:  02/25/14 191 lb (86.637 kg)  02/09/14 190 lb (86.183 kg)  02/08/14 190 lb (86.183 kg)     Ht Readings from Last  3 Encounters:  02/25/14 5\' 2"  (1.575 m)  02/09/14 5\' 2"  (1.575 m)  02/08/14 5\' 2"  (1.575 m)    General appearance: alert, cooperative and appears stated age Head: Normocephalic, without obvious abnormality, atraumatic Neck: no adenopathy, supple, symmetrical, trachea midline and thyroid not enlarged, symmetric, no tenderness/mass/nodules Lungs: clear to auscultation bilaterally Breasts: Inspection negative, No nipple retraction or  dimpling, No nipple discharge or bleeding, No axillary or supraclavicular adenopathy, Normal to palpation without dominant masses Heart: regular rate and rhythm Abdomen: soft, non-tender; no masses,  no organomegaly Extremities: extremities normal, atraumatic, no cyanosis or edema Skin: Skin color, texture, turgor normal. No rashes or lesions Lymph nodes: Cervical, supraclavicular, and axillary nodes normal. No abnormal inguinal nodes palpated Neurologic: Grossly normal  Pelvic: External genitalia:  no lesions              Urethra:  normal appearing urethra with no masses, tenderness or lesions              Bartholins and Skenes: normal                 Vagina: normal appearing vagina with normal color and discharge, no lesions              Cervix:  absent              Pap and high risk HPV testing done: no.            Bimanual Exam:  Uterus:   absent                                      Adnexa:  no masses                                      Rectovaginal: Confirms                                      Anus:  normal sphincter tone, no lesions  ASSESSMENT  Normal gynecologic exam. Status post robotic TLH/BSO ERT patient.   PLAN  Mammogram recommended yearly.  Pap smear and high risk HPV testing not indicated.  Continue with Estradiol but will lower dosage to 0.5 mg daily.  #90.  RF 1.  Has enough at home for the other 6 months worth.  I discussed risks of PE, DVT, MI, stroke, and breast cancer.  Counseled on self breast exam, Calcium and vitamin D intake, exercise. Return annually or prn   An After Visit Summary was printed and given to the patient.

## 2014-02-25 NOTE — Patient Instructions (Signed)

## 2014-06-08 ENCOUNTER — Encounter: Payer: Self-pay | Admitting: Internal Medicine

## 2014-07-19 ENCOUNTER — Telehealth: Payer: Self-pay | Admitting: Obstetrics and Gynecology

## 2014-07-19 NOTE — Telephone Encounter (Signed)
Left message upcoming appointment has been canceled and need to be rescheduled.

## 2014-07-25 ENCOUNTER — Encounter: Payer: Self-pay | Admitting: Obstetrics and Gynecology

## 2014-09-23 HISTORY — PX: KNEE ARTHROSCOPY WITH MENISCAL REPAIR: SHX5653

## 2014-10-07 ENCOUNTER — Telehealth: Payer: Self-pay | Admitting: Obstetrics and Gynecology

## 2014-10-07 NOTE — Telephone Encounter (Signed)
Please check on the status of the patient's last mammogram.  I do not see anything in our EPIC records.  I would like to see that she is up to date, and then I am happy to refill her estrogen!

## 2014-10-07 NOTE — Telephone Encounter (Signed)
Spoke with patient. Patient states that at her last visit she was going to try to decrease her Estrace to 0.5mg . With decrease in dosage patient began to have trouble sleeping and hot flashes. Had left over medication from 1mg  prescription so she increased her dose back up to 1 mg daily. "Since I went back to 1 mg I have not had any problems but know I do not have any pills left." Patient requesting refills for Estrace 1 mg be sent to [facility] pharmacy on file. Advised will need to send a message over to Dr.Silva with request and return call. Patient is agreeable.

## 2014-10-07 NOTE — Telephone Encounter (Signed)
Patient is requesting a change in Estradiol  from 0.5 mg to 1mg , Confirmed pharmacy with patient. Last seen 02/25/14.

## 2014-10-10 MED ORDER — ESTRADIOL 1 MG PO TABS: 1.0000 mg | ORAL_TABLET | Freq: Every day | ORAL | Status: DC

## 2014-10-10 NOTE — Telephone Encounter (Signed)
Spoke with Vaughan Basta at [facility] who states patient had a mammogram in June 2015. Awaiting faxed report of results from that mammogram for Dr.Silva's review.

## 2014-10-10 NOTE — Telephone Encounter (Signed)
Spoke with patient. Advised of message as seen below from [place]. Patient is agreeable. Rx for Estrace 1mg  daily #[facility]RF [facility]. Aex scheduled for 03/01/15 with Dr.Silva. Patient is agreeable to date and time.  Routing to provider for final review. Patient agreeable to disposition. Will close encounter

## 2014-10-10 NOTE — Telephone Encounter (Signed)
Most recent mammogram to Dr.Silva's desk for review and advise.

## 2014-10-10 NOTE — Telephone Encounter (Signed)
Ok for Estrace 1 mg daily for 6 months until she is due to return for her annual exam.   Please sent to the patient's pharmacy of choice.

## 2014-10-26 ENCOUNTER — Other Ambulatory Visit: Payer: Self-pay | Admitting: Obstetrics and Gynecology

## 2014-10-26 NOTE — Telephone Encounter (Signed)
Patient is requesting a refill for estradiol 1mg  Patient states this was suppose to be called into [facility] [place] last week

## 2014-10-26 NOTE — Telephone Encounter (Signed)
Rx sent electronically 10/10/14 #90/1R [facility] [place]. - Confirmed by pharmacy.   - Called pharmacy they have Rx on hold. Pharmacist will get it ready for pt.  - Called pt. LM Rx was sent last week.

## 2014-11-28 ENCOUNTER — Telehealth: Payer: Self-pay

## 2014-11-28 NOTE — Telephone Encounter (Signed)
OK to send refill of estradiol to [facility] until her annual exam is due.  She was on estradiol 1 mg daily last year and we made the plan to try to reduce her dosage to 0.5 mg daily.  Please confirm with her what her dosage of estradiol is and then send it to her pharmacy.  The goal is to try to use the lowest dosage possible that can control her menopausal symptoms.   Thank you.

## 2014-11-28 NOTE — Telephone Encounter (Signed)
Spoke with patient. Advised patient PA for Estradiol 0.5mg  was denied. Advised patient I have spoken with Dr.Silva. Patient may fill rx at [facility] and pay OOP $4 per month. Patient is agreeable and verbalizes understanding. Per Dr.Silva patient may have Estradiol 0.5mg  daily #90 1RF. Per patient's chart last refill was sent in as Estradiol 1mg . Will need to clarify before sending refills.  Dr.Silva, please advise.

## 2014-11-29 NOTE — Telephone Encounter (Signed)
Attempted to reach patient at [phone]. Phone rang and rang with no answer. Will try again later.

## 2014-11-29 NOTE — Telephone Encounter (Signed)
Spoke with patient. Patient states that she is currently taking estradiol 1mg  daily. "I actually picked it up yesterday. I still have refills. I just get it with cash." Patient still had 90 days of rx left. Next aex is scheduled for June 2016. No refills are needed at this time. Patient will call if she needs anything before her annual.  Routing to provider for final review. Patient agreeable to disposition. Will close encounter

## 2015-02-23 ENCOUNTER — Other Ambulatory Visit: Payer: Self-pay | Admitting: Obstetrics and Gynecology

## 2015-02-23 NOTE — Telephone Encounter (Signed)
10/10/14 #90/1 rfs was sent to [facility]Denied.

## 2015-03-01 ENCOUNTER — Ambulatory Visit (INDEPENDENT_AMBULATORY_CARE_PROVIDER_SITE_OTHER): Payer: Medicare Other | Admitting: Obstetrics and Gynecology

## 2015-03-01 ENCOUNTER — Encounter: Payer: Self-pay | Admitting: Obstetrics and Gynecology

## 2015-03-01 VITALS — BP 126/70 | HR 90 | Resp 14 | Ht 62.25 in | Wt 184.4 lb

## 2015-03-01 DIAGNOSIS — Z01419 Encounter for gynecological examination (general) (routine) without abnormal findings: Secondary | ICD-10-CM

## 2015-03-01 MED ORDER — ESTRADIOL 1 MG PO TABS: 1.0000 mg | ORAL_TABLET | Freq: Every day | ORAL | Status: DC

## 2015-03-01 NOTE — Progress Notes (Signed)
Patient ID: Shelley Spencer, female   DOB: [birth year], 69 y.o.   MRN: [id number] 69 y.o. [id number] Married Caucasian female here for annual exam.    Son living at home.  Grand children staying with patient as well.   Patient is on ERT. Still having mini hot flashes.  Wants to continue.   Has a member at [employer].  Had a normal physical exam with PCP recently.  Does have a dx of hiatal hernia.  Symptoms improved.  Having neck pain.  Has DJD.  Takes Lyrica for this.  Would like to avoid surgery if possible.  PCP:  Crist Infante, MD   No LMP recorded. Patient has had a hysterectomy.          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  Exercising: No.  none. Smoker:  Former smoker  Health Maintenance: Pap:  11/09 normal History of abnormal Pap:  no MMG:  02-24-14 Dense Cat:B/nl:[facility] Colonoscopy:  09-05-09 polyp with Dr. Olevia Perches.  Next due 08/2016. BMD:   2015   Result  wnl with Dr. Joylene Draft TDaP:  PCP Screening Labs:  Hb today: PCP, Urine today: PCP   reports that she quit smoking about 35 years ago. Her smoking use included Cigarettes. She has a 20 pack-year smoking history. She has never used smokeless tobacco. She reports that she drinks about 0.5 oz of alcohol per week. She reports that she does not use illicit drugs.  Past Medical History  Diagnosis Date  . Hyperlipidemia   . Hypertension   . Asthma   . GERD (gastroesophageal reflux disease)   . DJD (degenerative joint disease)     L-Spine  . Hematuria   . Fibroids   . Retinal tear 2005    Small right retinal tear  . Shingles 1980's    on her waist  . Colon polyp   . Depression   . Anxiety   . Bicornuate uterus     double cervix  . HNP (herniated nucleus pulposus)     lower back  . Deviated septum   . MVP (mitral valve prolapse)   . Anemia   . Arthritis   . Pre-diabetes   . Hiatal hernia     Past Surgical History  Procedure Laterality  Date  . Lumbar fusion    . Finger surgery Left     left third finger mucoid cyst removed  . Hysteroscopy      D&C PMB Endo Cx Polyp  . Laparoscopic hysterectomy  7/209/10    R-TLH/BSO  Fibroids, BAck Pain, Adenomyosis  . Bone spur surgery Bilateral     x 3  . Tubal ligation    . Removal vaginal septum    . Foot neuroma surgery Bilateral   . Cardiovascular stress test  02/08/2014    Current Outpatient Prescriptions  Medication Sig Dispense Refill  . albuterol (PROVENTIL HFA;VENTOLIN HFA) 108 (90 BASE) MCG/ACT inhaler Inhale 2 puffs into the lungs every 6 (six) hours as needed for shortness of breath.     Marland Kitchen aspirin EC 81 MG tablet Take 81 mg by mouth daily.    . budesonide-formoterol (SYMBICORT) 160-4.5 MCG/ACT inhaler Inhale 2 puffs into the lungs daily.     . cetirizine (ZYRTEC) 10 MG tablet Take 10 mg by mouth daily.    . cholecalciferol (VITAMIN D) 1000 UNITS tablet Take 1,000 Units by mouth daily.    Marland Kitchen doxazosin (CARDURA) 2 MG  tablet Take 2 mg by mouth daily.    Marland Kitchen estradiol (ESTRACE) 1 MG tablet Take 1 tablet (1 mg total) by mouth daily. 90 tablet 3  . fluticasone (FLONASE) 50 MCG/ACT nasal spray Place 2 sprays into both nostrils daily.    . furosemide (LASIX) 20 MG tablet Take 20 mg by mouth daily.    Marland Kitchen losartan (COZAAR) 100 MG tablet Take 100 mg by mouth daily.    . Multiple Vitamins-Minerals (MULTIVITAMIN PO) Take 1 tablet by mouth daily.    . niacin (NIASPAN) 1000 MG CR tablet Take 1,000 mg by mouth at bedtime.    . pantoprazole (PROTONIX) 40 MG tablet Take 1 tablet (40 mg total) by mouth 2 (two) times daily. 180 tablet 3  . PARoxetine (PAXIL) 40 MG tablet Take 40 mg by mouth every morning.    . potassium chloride SA (K-DUR,KLOR-CON) 20 MEQ tablet Take 20 mEq by mouth daily.    . pregabalin (LYRICA) 25 MG capsule Take 25 mg by mouth daily.    . traZODone (DESYREL) 150 MG tablet Take 150 mg by mouth at bedtime.    . vitamin B-12 (CYANOCOBALAMIN) 1000 MCG tablet Take 1,000 mcg  by mouth daily.     No current facility-administered medications for this visit.    Family History  Problem Relation Age of Onset  . Heart failure Father     Possibly related to EtOH  . Alcohol abuse Father   . Emphysema Father   . Emphysema Mother   . Heart failure Mother     Possibly related to EtOH  . Diabetes Mother   . Valvular heart disease Brother   . Alcohol abuse Mother   . Bipolar disorder Brother   . Aortic aneurysm Brother   . Heart disease Brother     rheumatic heart disease  . Crohn's disease Daughter   . Thyroid disease Daughter     ROS:  Pertinent items are noted in HPI.  Otherwise, a comprehensive ROS was negative.  Exam:   BP 126/70 mmHg  Pulse 90  Resp 14  Ht 5' 2.25" (1.581 m)  Wt 184 lb 6.4 oz (83.643 kg)  BMI 33.46 kg/m2    General appearance: alert, cooperative and appears stated age Head: Normocephalic, without obvious abnormality, atraumatic Neck: no adenopathy, supple, symmetrical, trachea midline and thyroid normal to inspection and palpation Lungs: clear to auscultation bilaterally Breasts: normal appearance, no masses or tenderness, Inspection negative, No nipple retraction or dimpling, No nipple discharge or bleeding, No axillary or supraclavicular adenopathy Heart: regular rate and rhythm Abdomen: soft, non-tender; bowel sounds normal; no masses,  no organomegaly Extremities: extremities normal, atraumatic, no cyanosis or edema Skin: Skin color, texture, turgor normal. No rashes or lesions Lymph nodes: Cervical, supraclavicular, and axillary nodes normal. No abnormal inguinal nodes palpated Neurologic: Grossly normal  Pelvic: External genitalia:  no lesions              Urethra:  normal appearing urethra with no masses, tenderness or lesions              Bartholins and Skenes: normal                 Vagina: normal appearing vagina with normal color and discharge, no lesions              Cervix: absent              Pap taken:  No. Bimanual Exam:  Uterus:  uterus absent  Adnexa: no mass, fullness, tenderness              Rectovaginal: Yes.  .  Confirms.              Anus:  normal sphincter tone, no lesions  Chaperone was present for exam.  Assessment:   Well woman visit with normal exam. Status post robotic TLH/BSO. ERT patient.   Plan: Yearly mammogram recommended after age 75.  Recommended self breast exam.  Pap and HR HPV as above. Discussed Calcium, Vitamin D, regular exercise program including cardiovascular and weight bearing exercise. Labs performed.  No..   See orders. Refills given on medications.  Yes.  .  See orders.  Estrace 1 mg daily for one year.  Discussed risks of breast cancer, DVT, PE, MI, stroke.  Wishes to continue.  Follow up annually and prn.      After visit summary provided.

## 2015-03-01 NOTE — Patient Instructions (Signed)

## 2015-03-02 ENCOUNTER — Ambulatory Visit: Payer: 59 | Admitting: Obstetrics and Gynecology

## 2016-01-08 ENCOUNTER — Ambulatory Visit (INDEPENDENT_AMBULATORY_CARE_PROVIDER_SITE_OTHER): Payer: Medicare Other | Admitting: Podiatry

## 2016-01-08 ENCOUNTER — Ambulatory Visit (INDEPENDENT_AMBULATORY_CARE_PROVIDER_SITE_OTHER): Payer: Medicare Other

## 2016-01-08 ENCOUNTER — Encounter: Payer: Self-pay | Admitting: Podiatry

## 2016-01-08 VITALS — BP 130/69 | HR 83 | Resp 18

## 2016-01-08 DIAGNOSIS — M779 Enthesopathy, unspecified: Secondary | ICD-10-CM | POA: Diagnosis not present

## 2016-01-08 DIAGNOSIS — R52 Pain, unspecified: Secondary | ICD-10-CM

## 2016-01-08 MED ORDER — TRIAMCINOLONE ACETONIDE 10 MG/ML IJ SUSP
10.0000 mg | Freq: Once | INTRAMUSCULAR | Status: AC
  Administered 2016-01-08: 10 mg

## 2016-01-08 NOTE — Progress Notes (Signed)
   Subjective:    Patient ID: Shelley Spencer, female    DOB: [birth year], 70 y.o.   MRN: [id number]  HPI i have some heel pain on my left foot and it has been going on for about a week and is tender on top and did hurt in the arch and is swollen some    Review of Systems  All other systems reviewed and are negative.      Objective:   Physical Exam        Assessment & Plan:

## 2016-01-09 NOTE — Progress Notes (Signed)
Subjective:     Patient ID: Shelley Spencer, female   DOB: 1946/05/14, 70 y.o.   MRN: [id number]  HPI patient states my left heel has been bothering me quite a bit and making it hard to walk. I do not remember specific injury but it's gradually making it more difficult for him delay should   Review of Systems  All other systems reviewed and are negative.      Objective:   Physical Exam  Constitutional: She is oriented to person, place, and time.  Cardiovascular: Intact distal pulses.   Musculoskeletal: Normal range of motion.  Neurological: She is oriented to person, place, and time.  Skin: Skin is warm.  Nursing note and vitals reviewed.  neurovascular status intact with good range of motion within normal limits with patient found to have exquisite discomfort plantar aspect left heel at the insertional point of the tendon into the calcaneus. Patient has good digital perfusion and is well oriented 3     Assessment:     Inflammatory fasciitis plantar aspect left heel    Plan:     H&P and x-rays reviewed with patient. Today I injected the left plantar fashion 3 Milligan Kenalog 5 mg Xylocaine and I applied fascial brace to lift the arch. Gave instructions on physical therapy supportive shoes and will be seen back again in the next several weeks  X-rays indicate small spur with no indication stress fracture or arthritis

## 2016-01-11 ENCOUNTER — Ambulatory Visit: Payer: Medicare Other | Admitting: Podiatry

## 2016-01-19 ENCOUNTER — Encounter: Payer: Self-pay | Admitting: Podiatry

## 2016-01-19 ENCOUNTER — Ambulatory Visit (INDEPENDENT_AMBULATORY_CARE_PROVIDER_SITE_OTHER): Payer: Medicare Other | Admitting: Podiatry

## 2016-01-19 DIAGNOSIS — M722 Plantar fascial fibromatosis: Secondary | ICD-10-CM

## 2016-01-19 DIAGNOSIS — M779 Enthesopathy, unspecified: Secondary | ICD-10-CM | POA: Diagnosis not present

## 2016-01-22 NOTE — Progress Notes (Signed)
Subjective:     Patient ID: Shelley Spencer, female   DOB: 11/24/1945, 70 y.o.   MRN: [id number]  HPI patient states my foot seems to be feeling better and I am walking with reduced discomfort. Patient states she needs new orthotics   Review of Systems     Objective:   Physical Exam Neurovascular status intact muscle strength is adequate with patient found to have moderate depression of the arch and pain in the left foot that's improved upon palpation    Assessment:     Improving tendinitis-like symptoms with flatfoot deformity noted    Plan:     Reduced the deformity and discussed the continuation of conservative supportive shoes stretching exercises and scanned for custom orthotics at the current time

## 2016-01-24 ENCOUNTER — Ambulatory Visit: Payer: Medicare Other | Admitting: Podiatry

## 2016-02-09 ENCOUNTER — Encounter: Payer: Self-pay | Admitting: Podiatry

## 2016-02-09 ENCOUNTER — Ambulatory Visit (INDEPENDENT_AMBULATORY_CARE_PROVIDER_SITE_OTHER): Payer: Medicare Other | Admitting: Podiatry

## 2016-02-09 DIAGNOSIS — M779 Enthesopathy, unspecified: Secondary | ICD-10-CM

## 2016-02-09 NOTE — Patient Instructions (Signed)

## 2016-02-09 NOTE — Progress Notes (Signed)
Pt presents today to pick up her orthotics. Dispensed orthotics with instructions, she is to follow up as needed if any issues arise. Patient seen by RN. Follow-up in 4 weeks if any issues or sooner if needed.

## 2016-03-06 ENCOUNTER — Ambulatory Visit (INDEPENDENT_AMBULATORY_CARE_PROVIDER_SITE_OTHER): Payer: Medicare Other | Admitting: Obstetrics and Gynecology

## 2016-03-06 ENCOUNTER — Encounter: Payer: Self-pay | Admitting: Obstetrics and Gynecology

## 2016-03-06 VITALS — BP 122/66 | HR 80 | Resp 16 | Ht 61.5 in | Wt 186.8 lb

## 2016-03-06 DIAGNOSIS — Z01419 Encounter for gynecological examination (general) (routine) without abnormal findings: Secondary | ICD-10-CM

## 2016-03-06 MED ORDER — ESTRADIOL 0.5 MG PO TABS: 0.5000 mg | ORAL_TABLET | Freq: Every day | ORAL | Status: DC

## 2016-03-06 NOTE — Progress Notes (Signed)
Patient ID: Shelley Spencer, female   DOB: 29-Jul-1946, 70 y.o.   MRN: [id number] 70 y.o. [id number] Married Caucasian female here for annual exam.    Some leakage of urine with cough or laugh.  No spontaneous leakage.   Stopped her estradiol and had hot flashes and so she restarted.   Having knee issues.   Son is living with patient.  His children also live part time with them.  He has substance abuse.   Going to [employer] soon with the grandchildren.   [id number] Perini, MD     No LMP recorded. Patient has had a hysterectomy.           Sexually active: No. female The current method of family planning is status post hysterectomy/BSO    Exercising: No.   Smoker:  no  Health Maintenance: Pap: 11/09 Neg  History of abnormal Pap:  no MMG:  05-16-15 Density B/Neg/BiRads1:[facility]. Colonoscopy:  09-05-09 polyp with Dr. Ulyses Southward due 08/2016. BMD:  2015 Result  Normal with Dr. Joylene Draft TDaP:  Up to date with PCP Gardasil:   N/A Hep C: will do with PCP.  Screening Labs:  Hb today: PCP, Urine today: PCP   reports that she quit smoking about 36 years ago. Her smoking use included Cigarettes. She has a 20 pack-year smoking history. She has never used smokeless tobacco. She reports that she drinks about 0.6 oz of alcohol per week. She reports that she does not use illicit drugs.  Past Medical History  Diagnosis Date  . Hyperlipidemia   . Hypertension   . Asthma   . GERD (gastroesophageal reflux disease)   . DJD (degenerative joint disease)     L-Spine  . Hematuria   . Fibroids   . Retinal tear 2005    Small right retinal tear  . Shingles 1980's    on her waist  . Colon polyp   . Depression   . Anxiety   . Bicornuate uterus     double cervix  . HNP (herniated nucleus pulposus)     lower back  . Deviated septum   . MVP (mitral valve prolapse)   . Anemia   . Arthritis   . Pre-diabetes   . Hiatal hernia     Past Surgical History  Procedure Laterality Date  . Lumbar fusion     . Finger surgery Left     left third finger mucoid cyst removed  . Hysteroscopy      D&C PMB Endo Cx Polyp  . Laparoscopic hysterectomy  7/209/10    R-TLH/BSO  Fibroids, BAck Pain, Adenomyosis  . Bone spur surgery Bilateral     x 3  . Tubal ligation    . Removal vaginal septum    . Foot neuroma surgery Bilateral   . Cardiovascular stress test  02/08/2014  . Knee arthroscopy with meniscal repair Right 2016    --Flossie Dibble, MD    Current Outpatient Prescriptions  Medication Sig Dispense Refill  . albuterol (PROVENTIL HFA;VENTOLIN HFA) 108 (90 BASE) MCG/ACT inhaler Inhale 2 puffs into the lungs every 6 (six) hours as needed for shortness of breath.     Marland Kitchen aspirin EC 81 MG tablet Take 81 mg by mouth daily.    . budesonide-formoterol (SYMBICORT) 160-4.5 MCG/ACT inhaler Inhale 2 puffs into the lungs as needed.     . cetirizine (ZYRTEC) 10 MG tablet Take 10 mg by mouth daily.    . cholecalciferol (VITAMIN D) 1000 UNITS tablet Take 1,000 Units by  mouth daily.    Marland Kitchen doxazosin (CARDURA) 2 MG tablet Take 2 mg by mouth daily.    . DUEXIS 800-26.6 MG TABS Take 1 tablet by mouth every 8 (eight) hours as needed.  0  . estradiol (ESTRACE) 1 MG tablet Take 1 tablet (1 mg total) by mouth daily. 90 tablet 3  . fluticasone (FLONASE) 50 MCG/ACT nasal spray Place 2 sprays into both nostrils as needed.     . furosemide (LASIX) 20 MG tablet Take 20 mg by mouth daily.    Marland Kitchen losartan (COZAAR) 100 MG tablet Take 100 mg by mouth daily.    . Multiple Vitamins-Minerals (MULTIVITAMIN PO) Take 1 tablet by mouth daily.    . niacin (NIASPAN) 1000 MG CR tablet Take 1,000 mg by mouth at bedtime.    . pantoprazole (PROTONIX) 40 MG tablet Take 1 tablet (40 mg total) by mouth 2 (two) times daily. 180 tablet 3  . PARoxetine (PAXIL) 40 MG tablet Take 40 mg by mouth every morning.    . potassium chloride SA (K-DUR,KLOR-CON) 20 MEQ tablet Take 20 mEq by mouth daily.    . pregabalin (LYRICA) 25 MG capsule Take 25 mg by mouth  daily.    . traZODone (DESYREL) 150 MG tablet Take 150 mg by mouth at bedtime.    . vitamin B-12 (CYANOCOBALAMIN) 1000 MCG tablet Take 1,000 mcg by mouth daily.     No current facility-administered medications for this visit.    Family History  Problem Relation Age of Onset  . Heart failure Father     Possibly related to EtOH  . Alcohol abuse Father   . Emphysema Father   . Emphysema Mother   . Heart failure Mother     Possibly related to EtOH  . Diabetes Mother   . Valvular heart disease Brother   . Alcohol abuse Mother   . Bipolar disorder Brother   . Aortic aneurysm Brother   . Heart disease Brother     rheumatic heart disease  . Crohn's disease Daughter   . Thyroid disease Daughter     ROS:  Pertinent items are noted in HPI.  Otherwise, a comprehensive ROS was negative.  Exam:   BP 122/66 mmHg  Pulse 80  Resp 16  Ht 5' 1.5" (1.562 m)  Wt 186 lb 12.8 oz (84.732 kg)  BMI 34.73 kg/m2    General appearance: alert, cooperative and appears stated age Head: Normocephalic, without obvious abnormality, atraumatic Neck: no adenopathy, supple, symmetrical, trachea midline and thyroid normal to inspection and palpation Lungs: clear to auscultation bilaterally Breasts: normal appearance, no masses or tenderness, Inspection negative, No nipple retraction or dimpling, No nipple discharge or bleeding, No axillary or supraclavicular adenopathy Heart: regular rate and rhythm Abdomen: incisions:  No.    , soft, non-tender; no masses, no organomegaly Extremities: extremities normal, atraumatic, no cyanosis or edema Skin: Skin color, texture, turgor normal. No rashes or lesions Lymph nodes: Cervical, supraclavicular, and axillary nodes normal. No abnormal inguinal nodes palpated Neurologic: Grossly normal  Pelvic: External genitalia:  no lesions              Urethra:  normal appearing urethra with no masses, tenderness or lesions              Bartholins and Skenes: normal                  Vagina: normal appearing vagina with normal color and discharge, no lesions  Cervix: absent              Pap taken: No. Bimanual Exam:  Uterus:  uterus absent.  Good Kegel.              Adnexa: no mass, fullness, tenderness              Rectal exam: Yes.  .  Confirms.              Anus:  normal sphincter tone, no lesions  Chaperone was present for exam.  Assessment:   Well woman visit with normal exam. Status post robotic TLH/BSO. ERT patient.   Plan: Yearly mammogram recommended after age 85.  Recommended self breast exam.  Pap and HR HPV as above. Discussed Calcium, Vitamin D, regular exercise program including cardiovascular and weight bearing exercise. Labs performed.  No..   See orders. Prescription medication(s) given.  Yes.  .  See orders.  Reduce Estrace to 0.5 mg daily.  I discussed risks of DVT, PE, MI, stroke and breast CA. The goal is to eventually wean off.  Patient aware.  Follow up annually and prn.       After visit summary provided.

## 2016-04-08 ENCOUNTER — Other Ambulatory Visit: Payer: Self-pay | Admitting: Obstetrics and Gynecology

## 2016-04-08 NOTE — Telephone Encounter (Signed)
Medication refill request: Estradiol 0.5mg   Last AEX:  03/06/16 Dr. Quincy Simmonds Next AEX: 03/07/17 Last MMG (if hormonal medication request): 05/16/15 BIRADS1 negative Refill authorized: 03/06/16 #90 w/3 refills; already done, patient should have enough refills

## 2016-08-05 ENCOUNTER — Ambulatory Visit (INDEPENDENT_AMBULATORY_CARE_PROVIDER_SITE_OTHER): Payer: Medicare Other | Admitting: Gastroenterology

## 2016-08-05 ENCOUNTER — Encounter: Payer: Self-pay | Admitting: Gastroenterology

## 2016-08-05 VITALS — BP 122/72 | HR 84 | Ht 61.5 in | Wt 184.5 lb

## 2016-08-05 DIAGNOSIS — R1013 Epigastric pain: Secondary | ICD-10-CM | POA: Diagnosis not present

## 2016-08-05 DIAGNOSIS — K219 Gastro-esophageal reflux disease without esophagitis: Secondary | ICD-10-CM | POA: Diagnosis not present

## 2016-08-05 DIAGNOSIS — R197 Diarrhea, unspecified: Secondary | ICD-10-CM

## 2016-08-05 MED ORDER — RANITIDINE HCL 300 MG PO TABS: 300.0000 mg | ORAL_TABLET | Freq: Every day | ORAL | 3 refills | Status: DC

## 2016-08-05 NOTE — Progress Notes (Signed)
Shelley Spencer    [id number]    May 18, 1946  Primary Care Physician:PERINI,MARK A, MD  Referring Physician: Crist Infante, MD [address] [place],  [address]  Chief complaint:  GERD, abdominal pain  HPI: 70 year old female with long-standing history of GERD, asthma on chronic PPI here with complaints of worsening GERD symptoms in the past 6 months. She increased the dose of Protonix to twice daily and did feel some improvement in the symptoms that she continues to wake up at night intermittently with heartburn and excessive coughing. EGD in May 2015 showed hiatal hernia 3 cm with Cameron's erosions and Erosive esophagitis .She also complained of diarrhea with urgency, she has about 3-5 bowel movements daily, semi-formed to liquid. Her last colonoscopy in December 2010 with removal of polypoid mucosa and prior to that in 2002 had an adenomatous polyp. Denies any nausea, vomiting, abdominal pain, melena or bright red blood per rectum   Outpatient Encounter Prescriptions as of 08/05/2016  Medication Sig  . albuterol (PROVENTIL HFA;VENTOLIN HFA) 108 (90 BASE) MCG/ACT inhaler Inhale 2 puffs into the lungs every 6 (six) hours as needed for shortness of breath.   Marland Kitchen aspirin EC 81 MG tablet Take 81 mg by mouth daily.  . budesonide-formoterol (SYMBICORT) 160-4.5 MCG/ACT inhaler Inhale 2 puffs into the lungs as needed.   . cetirizine (ZYRTEC) 10 MG tablet Take 10 mg by mouth daily.  . cholecalciferol (VITAMIN D) 1000 UNITS tablet Take 1,000 Units by mouth daily.  Marland Kitchen doxazosin (CARDURA) 2 MG tablet Take 2 mg by mouth daily.  Marland Kitchen estradiol (ESTRACE) 0.5 MG tablet Take 1 tablet (0.5 mg total) by mouth daily.  . fluticasone (FLONASE) 50 MCG/ACT nasal spray Place 2 sprays into both nostrils as needed.   . furosemide (LASIX) 20 MG tablet Take 20 mg by mouth daily.  Marland Kitchen losartan (COZAAR) 100 MG tablet Take 100 mg by mouth daily.  . Multiple Vitamins-Minerals (MULTIVITAMIN PO) Take 1 tablet by  mouth daily.  . niacin (NIASPAN) 1000 MG CR tablet Take 1,000 mg by mouth at bedtime.  . pantoprazole (PROTONIX) 40 MG tablet Take 1 tablet (40 mg total) by mouth 2 (two) times daily.  Marland Kitchen PARoxetine (PAXIL) 40 MG tablet Take 40 mg by mouth every morning.  . potassium chloride SA (K-DUR,KLOR-CON) 20 MEQ tablet Take 20 mEq by mouth daily.  . traZODone (DESYREL) 150 MG tablet Take 150 mg by mouth at bedtime.  . vitamin B-12 (CYANOCOBALAMIN) 1000 MCG tablet Take 1,000 mcg by mouth daily.  . [DISCONTINUED] DUEXIS 800-26.6 MG TABS Take 1 tablet by mouth every 8 (eight) hours as needed.  . [DISCONTINUED] pregabalin (LYRICA) 25 MG capsule Take 25 mg by mouth daily.   No facility-administered encounter medications on file as of 08/05/2016.     Allergies as of 08/05/2016 - Review Complete 08/05/2016  Allergen Reaction Noted  . Amlodipine  02/09/2014  . Fenofibrate  02/09/2014  . Omeprazole  02/09/2014  . Simvastatin  02/09/2014  . Singulair [montelukast sodium]  02/09/2014  . Statins  01/12/2014    Past Medical History:  Diagnosis Date  . Anemia   . Anxiety   . Arthritis   . Asthma   . Bicornuate uterus    double cervix  . Colon polyp   . Depression   . Deviated septum   . DJD (degenerative joint disease)    L-Spine  . Fibroids   . GERD (gastroesophageal reflux disease)   .  Hematuria   . Hiatal hernia   . HNP (herniated nucleus pulposus)    lower back  . Hyperlipidemia   . Hypertension   . MVP (mitral valve prolapse)   . Pre-diabetes   . Retinal tear 2005   Small right retinal tear  . Shingles 1980's   on her waist    Past Surgical History:  Procedure Laterality Date  . bone spur surgery Bilateral    x 3  . CARDIOVASCULAR STRESS TEST  02/08/2014  . FINGER SURGERY Left    left third finger mucoid cyst removed  . FOOT NEUROMA SURGERY Bilateral   . HYSTEROSCOPY     D&C PMB Endo Cx Polyp  . KNEE ARTHROSCOPY WITH MENISCAL REPAIR Right 2016   --Flossie Dibble, MD  .  LAPAROSCOPIC HYSTERECTOMY  7/209/10   R-TLH/BSO  Fibroids, BAck Pain, Adenomyosis  . LUMBAR FUSION    . removal vaginal septum    . TUBAL LIGATION      Family History  Problem Relation Age of Onset  . Heart failure Father     Possibly related to EtOH  . Alcohol abuse Father   . Emphysema Father   . Emphysema Mother   . Heart failure Mother     Possibly related to EtOH  . Diabetes Mother   . Alcohol abuse Mother   . Valvular heart disease Brother   . Bipolar disorder Brother   . Crohn's disease Daughter   . Thyroid disease Daughter   . Colon cancer Neg Hx   . Stomach cancer Neg Hx   . Esophageal cancer Neg Hx   . Rectal cancer Neg Hx   . Liver cancer Neg Hx     Social History   Social History  . Marital status: Married    Spouse name: N/A  . Number of children: 2  . Years of education: N/A   Occupational History  . retired Pharmacist, hospital Retired   Social History Main Topics  . Smoking status: Former Smoker    Packs/day: 1.00    Years: 20.00    Types: Cigarettes    Quit date: 09/24/1979  . Smokeless tobacco: Never Used  . Alcohol use 0.6 oz/week    1 Standard drinks or equivalent per week     Comment: occ alcohol--1 glass of wine/week  . Drug use: No  . Sexual activity: Yes    Partners: Male    Birth control/ protection: Surgical     Comment: R-TLH/BSO   Other Topics Concern  . Not on file   Social History Narrative   Husband with heart transplant.  Lives with husband and son.      Review of systems: Review of Systems  Constitutional: Negative for fever and chills.  HENT: Negative.   Eyes: Negative for blurred vision.  Respiratory: Positive for cough, shortness of breath and wheezing (Asthma) Cardiovascular: Negative for chest pain and palpitations.  Gastrointestinal: as per HPI Genitourinary: Negative for dysuria, urgency, frequency and hematuria.  Musculoskeletal: Positive for myalgias, back pain and joint pain.  Skin: Negative for itching and rash.    Neurological: Negative for dizziness, tremors, focal weakness, seizures and loss of consciousness.  Endo/Heme/Allergies: Positive for seasonal allergies.  Psychiatric/Behavioral:  Positive for depression, Negative for suicidal ideas and hallucinations.  All other systems reviewed and are negative.   Physical Exam: Vitals:   08/05/16 1006  BP: 122/72  Pulse: 84   Body mass index is 34.3 kg/m. Gen:      No acute distress  HEENT:  EOMI, sclera anicteric Neck:     No masses; no thyromegaly Lungs:    Clear to auscultation bilaterally; normal respiratory effort CV:         Regular rate and rhythm; no murmurs Abd:      + bowel sounds; soft, non-tender; no palpable masses, no distension Ext:    No edema; adequate peripheral perfusion Skin:      Warm and dry; no rash Neuro: alert and oriented x 3 Psych: normal mood and affect  Data Reviewed:  Reviewed labs, radiology imaging, old records and pertinent past GI work up   Assessment and Plan/Recommendations:  70 year old female with history of chronic GERD , asthma , hiatal hernia , erosive esophagitis and Cameron's erosions here with complaints of worsening GERD symptoms for the past few months , somewhat better with increasing PPI to twice daily   we'll add Zantac 300 mg at bedtime Schedule for EGD for evaluation The risks and benefits as well as alternatives of endoscopic procedure(s) have been discussed and reviewed. All questions answered. The patient agrees to proceed.  Irregular bowel habits with diarrhea likely secondary to lactose intolerance Advise patient to follow lactose free diet Also discussed with patient avoiding artificial sweeteners, soda or high fructose drinks Start probiotic align one capsule daily If continues to have persistent diarrhea will consider colonoscopy for further evaluation  25 minutes was spent face-to-face with the patient. Greater than 50% of the time used for counseling as well as treatment plan  and follow-up. She had multiple questions which were answered to her satisfaction  K. Denzil Magnuson , MD [phone] Mon-Fri 8a-5p [phone] after 5p, weekends, holidays  CC: Crist Infante, MD

## 2016-08-05 NOTE — Patient Instructions (Signed)
You have been scheduled for an endoscopy. Please follow written instructions given to you at your visit today. If you use inhalers (even only as needed), please bring them with you on the day of your procedure. Your physician has requested that you go to www.startemmi.com and enter the access code given to you at your visit today. This web site gives a general overview about your procedure. However, you should still follow specific instructions given to you by our office regarding your preparation for the procedure.  We will send in Zantac to your pharmacy  Use Align 1 capsule daily for 4-6 weeks   Lactose-Free Diet, Adult If you have lactose intolerance, you are not able to digest lactose. Lactose is a natural sugar found mainly in milk and milk products. You may need to avoid all foods and beverages that contain lactose. A lactose-free diet can help you do this.  WHAT DO I NEED TO KNOW ABOUT THIS DIET?  Do not consume foods, beverages, vitamins, minerals, or medicines with lactose. Read ingredients lists carefully.  Look for the words "lactose-free" on labels.  Use lactase enzyme drops or tablets as directed by your health care provider.  Use lactose-free milk or a milk alternative, such as soy milk, for drinking and cooking.  Make sure you get enough calcium and vitamin D in your diet. A lactose-free eating plan can be lacking in these important nutrients.  Take calcium and vitamin D supplements as directed by your health care provider. Talk to your provider about supplements if you are not able to get enough calcium and vitamin D from food. WHICH FOODS HAVE LACTOSE? Lactose is found in:   Milk and foods made from milk.  Yogurt.   Cheese.  Butter.   Margarine.   Sour cream.   Cream.   Whipped toppings and nondairy creamers.  Ice cream and other milk-based desserts. Lactose is also found in foods or products made with milk or milk ingredients. To find out whether a  food contains milk or a milk ingredient, look at the ingredients list. Avoid foods with the statement "May contain milk" and foods that contain:   Butter.   Cream.  Milk.  Milk solids.  Milk powder.   Whey.  Curd.  Caseinate.  Lactose.  Lactalbumin.  Lactoglobulin. WHAT ARE SOME ALTERNATIVES TO MILK AND FOODS MADE WITH MILK PRODUCTS?  Lactose-free milk.  Soy milk with added calcium and vitamin D.  Almond, coconut, or rice milk with added calcium and vitamin D. Note that these are low in protein.   Soy products, such as soy yogurt, soy cheese, soy ice cream, and soy-based sour cream. WHICH FOODS CAN I EAT? Grains Breads and rolls made without milk, such as [place], [place], or [place] bread, bagels, pita, and [employer]. Corn tortillas, corn meal, grits, and polenta. Crackers without lactose or milk solids, such as soda crackers and graham crackers. Cooked or dry cereals without lactose or milk solids. Pasta, quinoa, couscous, barley, oats, bulgur, farro, rice, wild rice, or other grains prepared without milk or lactose. Plain popcorn.  Vegetables Fresh, frozen, and canned vegetables without cheese, cream, or butter sauces. Fruits All fresh, canned, frozen, or dried fruits that are not processed with lactose. Meats and Other Protein Sources Plain beef, chicken, fish, [place], lamb, veal, pork, wild game, or ham. Kosher-prepared meat products. Strained or junior meats that do not contain milk. Eggs. Soy meat substitutes. Beans, lentils, and hummus. Tofu. Nuts and seeds. Peanut or other nut butters without  lactose. Soups, casseroles, and mixed dishes without cheese, cream, or milk.  Dairy Lactose-free milk. Soy, rice, or almond milk with added calcium and vitamin D. Soy cheese and yogurt. Beverages Carbonated drinks. Tea. Coffee, freeze-dried coffee, and some instant coffees. Fruit and vegetable juices.  Condiments Soy sauce. Carob powder. Olives. Gravy made with water.  Baker's cocoa. Angie Fava. Pure seasonings and spices. Ketchup. Mustard. Bouillon. Broth.  Sweets and Desserts Water and fruit ices. Gelatin. Cookies, pies, or cakes made from allowed ingredients, such as angel food cake. Pudding made with water or a milk substitute. Lactose-free tofu desserts. Soy, coconut milk, or rice-milk-based frozen desserts. Sugar. Honey. Jam, jelly, and marmalade. Molasses. Pure sugar candy. Dark chocolate without milk. Marshmallows.  Fats and Oils Margarines and salad dressings that do not contain milk. Berniece Salines. Vegetable oils. Shortening. Mayonnaise. Soy or coconut-based cream.  The items listed above may not be a complete list of recommended foods or beverages. Contact your dietitian for more options.  WHICH FOODS ARE NOT RECOMMENDED? Grains Breads and rolls that contain milk. Toaster pastries. Muffins, biscuits, waffles, cornbread, and pancakes. These can be prepared at home, commercial, or from mixes. Sweet rolls, donuts, English muffins, fry bread, lefse, flour tortillas with lactose, or [place] toast made with milk or milk ingredients. Crackers that contain lactose. Corn curls. Cooked or dry cereals with lactose. Vegetables Creamed or breaded vegetables. Vegetables in a cheese or butter sauce or with lactose-containing margarines. Instant potatoes. [place] fries. Scalloped or au gratin potatoes.  Fruits None.  Meats and Other Protein Sources Scrambled eggs, omelets, and souffles that contain milk. Creamed or breaded meat, fish, chicken, or [place]. Sausage products, such as wieners and liver sausage. Cold cuts that contain milk solids. Cheese, cottage cheese, ricotta cheese, and cheese spreads. Lasagna and macaroni and cheese. Pizza. Peanut or other nut butters with added milk solids. Casseroles or mixed dishes containing milk or cheese.  Dairy All dairy products, including milk, goat's milk, buttermilk, kefir, acidophilus milk, flavored milk, evaporated milk, condensed milk,  dulce de [place], eggnog, yogurt, cheese, and cheese spreads.  Beverages Hot chocolate. Cocoa with lactose. Instant iced teas. Powdered fruit drinks. Smoothies made with milk or yogurt.  Condiments Chewing gum that has lactose. Cocoa that has lactose. Spice blends if they contain milk products. Artificial sweeteners that contain lactose. Nondairy creamers.  Sweets and Desserts Ice cream, ice milk, gelato, sherbet, and frozen yogurt. Custard, pudding, and mousse. Cake, cream pies, cookies, and other desserts containing milk, cream, cream cheese, or milk chocolate. Pie crust made with milk-containing margarine or butter. Reduced-calorie desserts made with a sugar substitute that contains lactose. Toffee and butterscotch. Milk, white, or dark chocolate that contains milk. Fudge. Caramel.  Fats and Oils Margarines and salad dressings that contain milk or cheese. Cream. Half and half. Cream cheese. Sour cream. Chip dips made with sour cream or yogurt.  The items listed above may not be a complete list of foods and beverages to avoid. Contact your dietitian for more information. AM I GETTING ENOUGH CALCIUM? Calcium is found in many foods that contain lactose and is important for bone health. The amount of calcium you need depends on your age:   Adults younger than 50 years: 1000 mg of calcium a day.  Adults older than 50 years: 1200 mg of calcium a day. If you are not getting enough calcium, other calcium sources include:   Orange juice with calcium added. There are 300-350 mg of calcium in 1 cup of orange juice.  Sardines with edible bones. There are 325 mg of calcium in 3 oz of sardines.   Calcium-fortified soy milk. There are 300-400 mg of calcium in 1 cup of calcium-fortified soy milk.  Calcium-fortified rice or almond milk. There are 300 mg of calcium in 1 cup of calcium-fortified rice or almond milk.  Canned salmon with edible bones. There are 180 mg of calcium in 3 oz of canned salmon  with edible bones.   Calcium-fortified breakfast cereals. There are [phone] mg of calcium in calcium-fortified breakfast cereals.   Tofu set with calcium sulfate. There are 250 mg of calcium in  cup of tofu set with calcium sulfate.  Spinach, cooked. There are 145 mg of calcium in  cup of cooked spinach.  Edamame, cooked. There are 130 mg of calcium in  cup of cooked edamame.  Collard greens, cooked. There are 125 mg of calcium in  cup of cooked collard greens.  Kale, frozen or cooked. There are 90 mg of calcium in  cup of cooked or frozen kale.   Almonds. There are 95 mg of calcium in  cup of almonds.  Broccoli, cooked. There are 60 mg of calcium in 1 cup of cooked broccoli.   This information is not intended to replace advice given to you by your health care provider. Make sure you discuss any questions you have with your health care provider.   Document Released: 03/01/2002 Document Revised: 01/24/2015 Document Reviewed: 12/10/2013 Elsevier Interactive Patient Education 2016 Early.    Gastroesophageal Reflux Disease, Adult Normally, food travels down the esophagus and stays in the stomach to be digested. However, when a person has gastroesophageal reflux disease (GERD), food and stomach acid move back up into the esophagus. When this happens, the esophagus becomes sore and inflamed. Over time, GERD can create small holes (ulcers) in the lining of the esophagus.  CAUSES This condition is caused by a problem with the muscle between the esophagus and the stomach (lower esophageal sphincter, or LES). Normally, the LES muscle closes after food passes through the esophagus to the stomach. When the LES is weakened or abnormal, it does not close properly, and that allows food and stomach acid to go back up into the esophagus. The LES can be weakened by certain dietary substances, medicines, and medical conditions, including:  Tobacco use.  Pregnancy.  Having a hiatal  hernia.  Heavy alcohol use.  Certain foods and beverages, such as coffee, chocolate, onions, and peppermint. RISK FACTORS This condition is more likely to develop in:  People who have an increased body weight.  People who have connective tissue disorders.  People who use NSAID medicines. SYMPTOMS Symptoms of this condition include:  Heartburn.  Difficult or painful swallowing.  The feeling of having a lump in the throat.  Abitter taste in the mouth.  Bad breath.  Having a large amount of saliva.  Having an upset or bloated stomach.  Belching.  Chest pain.  Shortness of breath or wheezing.  Ongoing (chronic) cough or a night-time cough.  Wearing away of tooth enamel.  Weight loss. Different conditions can cause chest pain. Make sure to see your health care provider if you experience chest pain. DIAGNOSIS Your health care provider will take a medical history and perform a physical exam. To determine if you have mild or severe GERD, your health care provider may also monitor how you respond to treatment. You may also have other tests, including:  An endoscopy toexamine your stomach and esophagus with  a small camera.  A test thatmeasures the acidity level in your esophagus.  A test thatmeasures how much pressure is on your esophagus.  A barium swallow or modified barium swallow to show the shape, size, and functioning of your esophagus. TREATMENT The goal of treatment is to help relieve your symptoms and to prevent complications. Treatment for this condition may vary depending on how severe your symptoms are. Your health care provider may recommend:  Changes to your diet.  Medicine.  Surgery. HOME CARE INSTRUCTIONS Diet  Follow a diet as recommended by your health care provider. This may involve avoiding foods and drinks such as:  Coffee and tea (with or without caffeine).  Drinks that containalcohol.  Energy drinks and sports  drinks.  Carbonated drinks or sodas.  Chocolate and cocoa.  Peppermint and mint flavorings.  Garlic and onions.  Horseradish.  Spicy and acidic foods, including peppers, chili powder, curry powder, vinegar, hot sauces, and barbecue sauce.  Citrus fruit juices and citrus fruits, such as oranges, lemons, and limes.  Tomato-based foods, such as red sauce, chili, salsa, and pizza with red sauce.  Fried and fatty foods, such as donuts, french fries, potato chips, and high-fat dressings.  High-fat meats, such as hot dogs and fatty cuts of red and white meats, such as rib eye steak, sausage, ham, and bacon.  High-fat dairy items, such as whole milk, butter, and cream cheese.  Eat small, frequent meals instead of large meals.  Avoid drinking large amounts of liquid with your meals.  Avoid eating meals during the 2-3 hours before bedtime.  Avoid lying down right after you eat.  Do not exercise right after you eat. General Instructions  Pay attention to any changes in your symptoms.  Take over-the-counter and prescription medicines only as told by your health care provider. Do not take aspirin, ibuprofen, or other NSAIDs unless your health care provider told you to do so.  Do not use any tobacco products, including cigarettes, chewing tobacco, and e-cigarettes. If you need help quitting, ask your health care provider.  Wear loose-fitting clothing. Do not wear anything tight around your waist that causes pressure on your abdomen.  Raise (elevate) the head of your bed 6 inches (15cm).  Try to reduce your stress, such as with yoga or meditation. If you need help reducing stress, ask your health care provider.  If you are overweight, reduce your weight to an amount that is healthy for you. Ask your health care provider for guidance about a safe weight loss goal.  Keep all follow-up visits as told by your health care provider. This is important. SEEK MEDICAL CARE IF:  You have  new symptoms.  You have unexplained weight loss.  You have difficulty swallowing, or it hurts to swallow.  You have wheezing or a persistent cough.  Your symptoms do not improve with treatment.  You have a hoarse voice. SEEK IMMEDIATE MEDICAL CARE IF:  You have pain in your arms, neck, jaw, teeth, or back.  You feel sweaty, dizzy, or light-headed.  You have chest pain or shortness of breath.  You vomit and your vomit looks like blood or coffee grounds.  You faint.  Your stool is bloody or black.  You cannot swallow, drink, or eat.   This information is not intended to replace advice given to you by your health care provider. Make sure you discuss any questions you have with your health care provider.   Document Released: 06/19/2005 Document Revised: 05/31/2015 Document  Reviewed: 01/04/2015 Elsevier Interactive Patient Education [employer].

## 2016-08-28 ENCOUNTER — Ambulatory Visit (AMBULATORY_SURGERY_CENTER): Payer: Medicare Other | Admitting: Gastroenterology

## 2016-08-28 ENCOUNTER — Encounter: Payer: Self-pay | Admitting: Gastroenterology

## 2016-08-28 VITALS — BP 126/79 | HR 68 | Temp 98.0°F | Resp 12 | Ht 61.0 in | Wt 184.0 lb

## 2016-08-28 DIAGNOSIS — K2100 Gastro-esophageal reflux disease with esophagitis, without bleeding: Secondary | ICD-10-CM

## 2016-08-28 MED ORDER — SODIUM CHLORIDE 0.9 % IV SOLN: 500.0000 mL | INTRAVENOUS | Status: DC

## 2016-08-28 NOTE — Op Note (Signed)
[facility] Patient Name: Shelley Spencer Procedure Date: 08/28/2016 10:20 AM MRN: [id number] Endoscopist: Mauri Pole , MD Age: 70 Referring MD:  Date of Birth: [birth year] Gender: Female Account #: [id number] Procedure:                Upper GI endoscopy Indications:              Esophageal reflux symptoms that recur despite                            appropriate therapy Medicines:                Monitored Anesthesia Care Procedure:                Pre-Anesthesia Assessment:                           - Prior to the procedure, a History and Physical                            was performed, and patient medications and                            allergies were reviewed. The patient's tolerance of                            previous anesthesia was also reviewed. The risks                            and benefits of the procedure and the sedation                            options and risks were discussed with the patient.                            All questions were answered, and informed consent                            was obtained. Prior Anticoagulants: The patient has                            taken no previous anticoagulant or antiplatelet                            agents. ASA Grade Assessment: II - A patient with                            mild systemic disease. After reviewing the risks                            and benefits, the patient was deemed in                            satisfactory condition to undergo the procedure.  After obtaining informed consent, the endoscope was                            passed under direct vision. Throughout the                            procedure, the patient's blood pressure, pulse, and                            oxygen saturations were monitored continuously. The                            Model GIF-[id number]) scope was introduced                            through the mouth, and advanced to  the second part                            of duodenum. The upper GI endoscopy was                            accomplished without difficulty. The patient                            tolerated the procedure well. Scope In: Scope Out: Findings:                 LA Grade A (one or more mucosal breaks less than 5                            mm, not extending between tops of 2 mucosal folds)                            esophagitis with bleeding was found 35 to 36 cm                            from the incisors.                           A single umbilicated, submucosal lesion measuring 7                            mm in diameter was found in the gastric antrum                            suggestive of pancreatic rest.                           The examined duodenum was normal. Complications:            No immediate complications. Estimated Blood Loss:     Estimated blood loss was minimal. Impression:               - LA Grade A erosive esophagitis.                           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                           - Normal examined duodenum.                           - No specimens collected. Recommendation:           - Patient has a contact number available for                            emergencies. The signs and symptoms of potential                            delayed complications were discussed with the                            patient. Return to normal activities tomorrow.                            Written discharge instructions were provided to the                            patient.                           - Resume previous diet.                           - Continue present medications.                           - Follow an antireflux regimen.                           - No repeat upper endoscopy.                           - Return to GI office PRN. Mauri Pole, MD 08/28/2016 10:35:54  AM This report has been signed electronically.

## 2016-08-28 NOTE — Patient Instructions (Signed)
YOU HAD AN ENDOSCOPIC PROCEDURE TODAY AT THE [facility]:   Refer to the procedure report that was given to you for any specific questions about what was found during the examination.  If the procedure report does not answer your questions, please call your gastroenterologist to clarify.  If you requested that your care partner not be given the details of your procedure findings, then the procedure report has been included in a sealed envelope for you to review at your convenience later.  YOU SHOULD EXPECT: Some feelings of bloating in the abdomen. Passage of more gas than usual.  Walking can help get rid of the air that was put into your GI tract during the procedure and reduce the bloating. If you had a lower endoscopy (such as a colonoscopy or flexible sigmoidoscopy) you may notice spotting of blood in your stool or on the toilet paper. If you underwent a bowel prep for your procedure, you may not have a normal bowel movement for a few days.  Please Note:  You might notice some irritation and congestion in your nose or some drainage.  This is from the oxygen used during your procedure.  There is no need for concern and it should clear up in a day or so.  SYMPTOMS TO REPORT IMMEDIATELY:   Following upper endoscopy (EGD)  Vomiting of blood or coffee ground material  New chest pain or pain under the shoulder blades  Painful or persistently difficult swallowing  New shortness of breath  Fever of 100F or higher  Black, tarry-looking stools  For urgent or emergent issues, a gastroenterologist can be reached at any hour by calling [phone].  Please read all handouts given to you by your recovery nurse.   DIET:  We do recommend a small meal at first, but then you may proceed to your regular diet.  Drink plenty of fluids but you should avoid alcoholic beverages for 24 hours.  ACTIVITY:  You should plan to take it easy for the rest of today and you should NOT DRIVE or use heavy  machinery until tomorrow (because of the sedation medicines used during the test).    FOLLOW UP: Our staff will call the number listed on your records the next business day following your procedure to check on you and address any questions or concerns that you may have regarding the information given to you following your procedure. If we do not reach you, we will leave a message.  However, if you are feeling well and you are not experiencing any problems, there is no need to return our call.  We will assume that you have returned to your regular daily activities without incident.  If any biopsies were taken you will be contacted by phone or by letter within the next 1-3 weeks.  Please call us at [phone] if you have not heard about the biopsies in 3 weeks.    SIGNATURES/CONFIDENTIALITY: You and/or your care partner have signed paperwork which will be entered into your electronic medical record.  These signatures attest to the fact that that the information above on your After Visit Summary has been reviewed and is understood.  Full responsibility of the confidentiality of this discharge information lies with you and/or your care-partner.  Thank you for letting us take care of your healthcare needs today. 

## 2016-08-28 NOTE — Progress Notes (Signed)
To PACU, vss patent aw report to rn 

## 2016-08-29 ENCOUNTER — Telehealth: Payer: Self-pay

## 2016-08-29 NOTE — Telephone Encounter (Signed)
  Follow up Call-  Call back number 08/28/2016 02/09/2014  Post procedure Call Back phone  # [phone]  Permission to leave phone message Yes Yes  Some recent data might be hidden    Patient did not have questions about her diet, yes was clicked in error.  Patient questions:  Do you have a fever, pain , or abdominal swelling? No. Pain Score  0 *  Have you tolerated food without any problems? Yes.    Have you been able to return to your normal activities? Yes.    Do you have any questions about your discharge instructions: Diet   Yes.   Medications  No. Follow up visit  No.  Do you have questions or concerns about your Care? No.  Actions: * If pain score is 4 or above: No action needed, pain <4.

## 2016-10-16 ENCOUNTER — Ambulatory Visit (INDEPENDENT_AMBULATORY_CARE_PROVIDER_SITE_OTHER): Payer: Medicare Other | Admitting: Gastroenterology

## 2016-10-16 ENCOUNTER — Encounter: Payer: Self-pay | Admitting: Gastroenterology

## 2016-10-16 VITALS — BP 144/84 | HR 88 | Ht 62.0 in | Wt 186.5 lb

## 2016-10-16 DIAGNOSIS — K2100 Gastro-esophageal reflux disease with esophagitis, without bleeding: Secondary | ICD-10-CM

## 2016-10-16 MED ORDER — RANITIDINE HCL 150 MG PO TABS: 150.0000 mg | ORAL_TABLET | Freq: Every day | ORAL | 3 refills | Status: DC

## 2016-10-16 MED ORDER — PANTOPRAZOLE SODIUM 40 MG PO TBEC: 40.0000 mg | DELAYED_RELEASE_TABLET | Freq: Two times a day (BID) | ORAL | 3 refills | Status: DC

## 2016-10-16 NOTE — Progress Notes (Signed)
Shelley Spencer    ZB:[id number]    1946/02/15  Primary Care Physician:PERINI,MARK A, MD  Referring Physician: Crist Infante, MD [address] [place], [place] [address]  Chief complaint:  GERD HPI: 71 year old female here for follow-up visit. She was last seen in office on 08/05/2016. Her symptoms have significantly improved with Protonix twice daily. Diarrhea has also improved lactose-free diet . He has no specific complaints . 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 Exam was otherwise unremarkable. Denies any nausea, vomiting, abdominal pain, melena or bright red blood per rectum      Outpatient Encounter Prescriptions as of 10/16/2016  Medication Sig  . albuterol (PROVENTIL HFA;VENTOLIN HFA) 108 (90 BASE) MCG/ACT inhaler Inhale 2 puffs into the lungs every 6 (six) hours as needed for shortness of breath.   Marland Kitchen aspirin EC 81 MG tablet Take 81 mg by mouth daily.  . budesonide-formoterol (SYMBICORT) 160-4.5 MCG/ACT inhaler Inhale 2 puffs into the lungs as needed.   . cetirizine (ZYRTEC) 10 MG tablet Take 10 mg by mouth daily.  . cholecalciferol (VITAMIN D) 1000 UNITS tablet Take 1,000 Units by mouth daily.  Marland Kitchen doxazosin (CARDURA) 2 MG tablet Take 2 mg by mouth daily.  Marland Kitchen estradiol (ESTRACE) 0.5 MG tablet Take 1 tablet (0.5 mg total) by mouth daily.  . fluticasone (FLONASE) 50 MCG/ACT nasal spray Place 2 sprays into both nostrils as needed.   . furosemide (LASIX) 20 MG tablet Take 20 mg by mouth daily.  Marland Kitchen losartan (COZAAR) 100 MG tablet Take 100 mg by mouth daily.  . Multiple Vitamins-Minerals (MULTIVITAMIN PO) Take 1 tablet by mouth daily.  . niacin (NIASPAN) 1000 MG CR tablet Take 1,000 mg by mouth at bedtime.  . pantoprazole (PROTONIX) 40 MG tablet Take 1 tablet (40 mg total) by mouth 2 (two) times daily.  Marland Kitchen PARoxetine (PAXIL) 40 MG tablet Take 40 mg by mouth every morning.  . potassium chloride SA  (K-DUR,KLOR-CON) 20 MEQ tablet Take 20 mEq by mouth daily.  . ranitidine (ZANTAC) 300 MG tablet Take 1 tablet (300 mg total) by mouth at bedtime.  . traZODone (DESYREL) 150 MG tablet Take 150 mg by mouth at bedtime.  . vitamin B-12 (CYANOCOBALAMIN) 1000 MCG tablet Take 1,000 mcg by mouth daily.  . [DISCONTINUED] pantoprazole (PROTONIX) 40 MG tablet Take 1 tablet (40 mg total) by mouth 2 (two) times daily.  . ranitidine (ZANTAC) 150 MG tablet Take 1 tablet (150 mg total) by mouth at bedtime.   Facility-Administered Encounter Medications as of 10/16/2016  Medication  . 0.9 %  sodium chloride infusion    Allergies as of 10/16/2016 - Review Complete 10/16/2016  Allergen Reaction Noted  . Amlodipine  02/09/2014  . Fenofibrate  02/09/2014  . Omeprazole  02/09/2014  . Simvastatin  02/09/2014  . Singulair [montelukast sodium]  02/09/2014  . Statins  01/12/2014    Past Medical History:  Diagnosis Date  . Anemia   . Anxiety   . Arthritis   . Asthma   . Bicornuate uterus    double cervix  . Colon polyp   . Depression   . Deviated septum   . DJD (degenerative joint disease)    L-Spine  . Fibroids   . GERD (gastroesophageal reflux disease)   . Hematuria   . Hiatal hernia   . HNP (herniated nucleus pulposus)    lower back  .  Hyperlipidemia   . Hypertension   . MVP (mitral valve prolapse)   . Pre-diabetes   . Retinal tear 2005   Small right retinal tear  . Shingles 1980's   on her waist    Past Surgical History:  Procedure Laterality Date  . bone spur surgery Bilateral    x 3  . CARDIOVASCULAR STRESS TEST  02/08/2014  . FINGER SURGERY Left    left third finger mucoid cyst removed  . FOOT NEUROMA SURGERY Bilateral   . HYSTEROSCOPY     D&C PMB Endo Cx Polyp  . KNEE ARTHROSCOPY WITH MENISCAL REPAIR Right 2016   --Flossie Dibble, MD  . LAPAROSCOPIC HYSTERECTOMY  7/209/10   R-TLH/BSO  Fibroids, BAck Pain, Adenomyosis  . LUMBAR FUSION    . removal vaginal septum    . TUBAL  LIGATION      Family History  Problem Relation Age of Onset  . Heart failure Father     Possibly related to EtOH  . Alcohol abuse Father   . Emphysema Father   . Emphysema Mother   . Heart failure Mother     Possibly related to EtOH  . Diabetes Mother   . Alcohol abuse Mother   . Valvular heart disease Brother   . Bipolar disorder Brother   . Crohn's disease Daughter   . Thyroid disease Daughter   . Colon cancer Neg Hx   . Stomach cancer Neg Hx   . Esophageal cancer Neg Hx   . Rectal cancer Neg Hx   . Liver cancer Neg Hx     Social History   Social History  . Marital status: Married    Spouse name: N/A  . Number of children: 2  . Years of education: N/A   Occupational History  . retired Pharmacist, hospital Retired   Social History Main Topics  . Smoking status: Former Smoker    Packs/day: 1.00    Years: 20.00    Types: Cigarettes    Quit date: 09/24/1979  . Smokeless tobacco: Never Used  . Alcohol use 0.6 oz/week    1 Standard drinks or equivalent per week     Comment: occ alcohol--1 glass of wine/week  . Drug use: No  . Sexual activity: Yes    Partners: Male    Birth control/ protection: Surgical     Comment: R-TLH/BSO   Other Topics Concern  . Not on file   Social History Narrative   Husband with heart transplant.  Lives with husband and son.      Review of systems: Review of Systems  Constitutional: Negative for fever and chills.  HENT: Negative.   Eyes: Negative for blurred vision.  Respiratory: Negative for cough, shortness of breath and wheezing.   Cardiovascular: Negative for chest pain and palpitations.  Gastrointestinal: as per HPI Genitourinary: Negative for dysuria, urgency, frequency and hematuria.  Musculoskeletal: Negative for myalgias, back pain and joint pain.  Skin: Negative for itching and rash.  Neurological: Negative for dizziness, tremors, focal weakness, seizures and loss of consciousness.  Endo/Heme/Allergies: Positive for seasonal  allergies.  Psychiatric/Behavioral: Negative for depression, suicidal ideas and hallucinations.  All other systems reviewed and are negative.   Physical Exam: Vitals:   10/16/16 1451  BP: (!) 144/84  Pulse: 88   Body mass index is 34.11 kg/m. Gen:      No acute distress HEENT:  EOMI, sclera anicteric Neck:     No masses; no thyromegaly Lungs:    Clear to auscultation bilaterally;  normal respiratory effort CV:         Regular rate and rhythm; no murmurs Abd:      + bowel sounds; soft, non-tender; no palpable masses, no distension Ext:    No edema; adequate peripheral perfusion Skin:      Warm and dry; no rash Neuro: alert and oriented x 3 Psych: normal mood and affect  Data Reviewed:  Reviewed labs, radiology imaging, old records and pertinent past GI work up  Past relevant GI work up EGD 08/2016 Mild LA Grade A esophagitis, no erosions.  EGD in May 2015 showed hiatal hernia 3 cm with Cameron's erosions and Erosive esophagitis.  Colonoscopy in December 2010 with removal of polypoid mucosa and prior to that in 2002 had an adenomatous polyp.  Assessment and Plan/Recommendations: 71 year old female with chronic GERD here for follow-up visit Consider tapering down Protonix to once a day, advised patient to take it 30 minutes before breakfast Zantac 150 milligrams at bedtime as needed Continue to follow antireflux measures Return as needed  15 minutes was spent face-to-face with the patient. Greater than 50% of the time used for counseling as well as treatment plan and follow-up. She had multiple questions which were answered to her satisfaction  K. Denzil Magnuson , MD [phone] Mon-Fri 8a-5p [phone] after 5p, weekends, holidays  CC: Crist Infante, MD

## 2016-10-16 NOTE — Patient Instructions (Addendum)
Follow up as needed   We have sent Protonix and Zantac to your pharmacy

## 2016-10-29 ENCOUNTER — Telehealth: Payer: Self-pay | Admitting: Gastroenterology

## 2016-10-30 NOTE — Telephone Encounter (Signed)
Which mg of Zantac did you want patient to take? 300 or 150  Both is on her med list  Thanks

## 2016-10-30 NOTE — Telephone Encounter (Signed)
150mg  Ranitidine. Thanks

## 2016-10-30 NOTE — Telephone Encounter (Signed)
Need to know if patient needs 90 day supply now or has enough to last for now

## 2016-10-31 MED ORDER — RANITIDINE HCL 150 MG PO TABS: 150.0000 mg | ORAL_TABLET | Freq: Every day | ORAL | 3 refills | Status: DC

## 2016-10-31 NOTE — Telephone Encounter (Signed)
Pt needs her Ranitidine med sent to pharmacy and she said the pharmacy will put it on file

## 2016-10-31 NOTE — Telephone Encounter (Signed)
90 day supply of Zantac 150 sent to pharmacy

## 2016-11-24 ENCOUNTER — Other Ambulatory Visit: Payer: Self-pay | Admitting: Gastroenterology

## 2017-01-30 ENCOUNTER — Other Ambulatory Visit: Payer: Self-pay | Admitting: Obstetrics and Gynecology

## 2017-01-30 NOTE — Telephone Encounter (Signed)
Medication refill request: estradiol  Last AEX:  03-06-16  Next AEX: 03-07-17 Last MMG (if hormonal medication request): 05-23-16 per [facility]- requested a copy from [place] authorized: Please advise

## 2017-01-31 ENCOUNTER — Telehealth: Payer: Self-pay | Admitting: Obstetrics and Gynecology

## 2017-01-31 NOTE — Telephone Encounter (Signed)
Patient called to check on the status of her refill request for estradial. She said it requires prior authorization but she needs it as soon as possible. She wants to be sure the estradial prescription is for 1 MG not .5 MG. She also stated, "When I am without this, I get anxiety so I need this taken care of as soon as possible."

## 2017-02-03 MED ORDER — ESTRADIOL 1 MG PO TABS: 1.0000 mg | ORAL_TABLET | Freq: Every day | ORAL | 0 refills | Status: DC

## 2017-02-03 NOTE — Telephone Encounter (Signed)
See refill encounter dated 01/30/17, refill for estradiol 0.5 mg sent.   Spoke with Gerald Stabs at [facility], was advised RX needs PA, PA request faxed.  Spoke with patient. Advised RX for estradiol 0.5mg  sent to pharmacy on 01/30/17. Patient states this does not help as medication needs PA. Patient requesting RX for estradiol 1mg  daily. Patient states she was out of medication when she called, takes estradiol for anxiety not HRT. Patient states she has been taking two of the 0.5mg  estradiol daily. Patient states if Dr. Quincy Simmonds not willing to fill as 1mg  will call pcp or psychiatrist. Patient states she has anxiety attacks without medication. Advised patient would review with Dr. Quincy Simmonds and return call with recommendations.    Dr. Quincy Simmonds, please advise on estradiol 1mg ?  Lovena Le, have your received PA for medication?

## 2017-02-03 NOTE — Telephone Encounter (Signed)
New Rx to pharmacy on file. PA for Estradiol 1mg  tab submitted through covermymeds.    Spoke with patient, advised as seen below per Dr. Quincy Simmonds. Advised patient PA submitted online through [email or web address], can take up to 72 hours for notification of approval or denial, will notify of decision. Patient verbalizes understanding and is agreeable.   Spoke with Gerald Stabs at [facility], advised new RX placed for estradiol 1mg , cancel previous RX.    Cc: Terence Lux

## 2017-02-03 NOTE — Telephone Encounter (Signed)
Ok to refill Estrace 1 mg daily for one month. I will need to see her for her annual exam in June and we can review this further at that time.

## 2017-02-04 NOTE — Telephone Encounter (Signed)
PA for estradiol 1mg  denied on 02/03/17. Request Reference Number: [id number] Called number provided [phone]. Spoke with Amy, was advised is a benefit exclusion, denied.    Call to patient, Left message to call Sharee Pimple at [phone].

## 2017-02-07 ENCOUNTER — Telehealth: Payer: Self-pay | Admitting: Gastroenterology

## 2017-02-10 NOTE — Telephone Encounter (Signed)
Patient had a 1 cm tubular adenoma removed in 2002, subsequent colonoscopy in 2005 and 2010 were negative for polyps. Please schedule her for surveillance colonoscopy given history of 1 cm tubular adenoma in 2002. Thanks

## 2017-02-10 NOTE — Telephone Encounter (Signed)
Dr Joylene Draft asked her to double check on the recall timing for her colon cancer screening. She denies any family history of colon cancer or colon polyps

## 2017-02-11 NOTE — Telephone Encounter (Signed)
Left her a message to cal. She can be scheduled for a direct colon

## 2017-02-11 NOTE — Telephone Encounter (Signed)
Left detailed message, ok per current dpr. Advised PA for estradiol 1mg  denied on 02/03/17. Return call to office for any additional questions, [phone]. Will plan to see you at [facility] with Dr. Quincy Simmonds on 03/07/17.  Routing to provider for final review. Patient is agreeable to disposition. Will close encounter.

## 2017-02-24 ENCOUNTER — Encounter: Payer: Self-pay | Admitting: Gastroenterology

## 2017-03-02 ENCOUNTER — Telehealth: Payer: Self-pay | Admitting: Obstetrics and Gynecology

## 2017-03-07 ENCOUNTER — Ambulatory Visit (INDEPENDENT_AMBULATORY_CARE_PROVIDER_SITE_OTHER): Payer: Medicare Other | Admitting: Obstetrics and Gynecology

## 2017-03-07 ENCOUNTER — Encounter: Payer: Self-pay | Admitting: Obstetrics and Gynecology

## 2017-03-07 VITALS — BP 110/60 | HR 92 | Resp 16 | Ht 62.0 in | Wt 185.0 lb

## 2017-03-07 DIAGNOSIS — Z79899 Other long term (current) drug therapy: Secondary | ICD-10-CM | POA: Diagnosis not present

## 2017-03-07 DIAGNOSIS — Z01419 Encounter for gynecological examination (general) (routine) without abnormal findings: Secondary | ICD-10-CM

## 2017-03-07 MED ORDER — ESTRADIOL 0.05 MG/24HR TD PTTW: 1.0000 | MEDICATED_PATCH | TRANSDERMAL | 11 refills | Status: DC

## 2017-03-07 NOTE — Progress Notes (Signed)
71 y.o. G60P0012 Married Caucasian female here for annual exam.    Taking Estradiol 1 mg daily for mood stabilization.  When she reduces this to 0.5 mg she does not feel well.   ROS- Depression, anxiety, bloating, constipation, urinary incontinence with cough/sneeze.  No urinary leakage just after emptying her bladder.    Stress at home.  On Paxil and Trazodone.  Patient is seeing a psychiatrist regularly.  Son is still at home, not driving, and not taking medication.  She needs to drive him around.   A1C about 5.6.  Went to [employer] last year with her family.   PCP: Dr. Crist Infante     No LMP recorded. Patient has had a hysterectomy.           Sexually active: No.  The current method of family planning is status post hysterectomy/BSO.    Exercising: Yes.    gym, lift weights Smoker:  no  Health Maintenance: Pap:  11/09 Neg  History of abnormal Pap:  no MMG:  05/23/16 BIRADS 2 benign/density b Colonoscopy: 09-05-09 polyp with Dr. Lemmie Evens Scheduled for August 2018 BMD:   2015  Result  Normal with Dr. Joylene Draft TDaP:  Done May 2018 w/ PCP Gardasil:   N/A HIV: never Hep C: not done Screening Labs:  Hb today: PCP   reports that she quit smoking about 37 years ago. Her smoking use included Cigarettes. She has a 20.00 pack-year smoking history. She has never used smokeless tobacco. She reports that she drinks about 0.6 oz of alcohol per week . She reports that she does not use drugs.  Past Medical History:  Diagnosis Date  . Anemia   . Anxiety   . Arthritis   . Asthma   . Bicornuate uterus    double cervix  . Colon polyp   . Depression   . Deviated septum   . DJD (degenerative joint disease)    L-Spine  . Fibroids   . GERD (gastroesophageal reflux disease)   . Hematuria   . Hiatal hernia   . HNP (herniated nucleus pulposus)    lower back  . Hyperlipidemia   . Hypertension   . MVP (mitral valve prolapse)   . Pre-diabetes   . Retinal tear 2005   Small right retinal  tear  . Shingles 1980's   on her waist    Past Surgical History:  Procedure Laterality Date  . bone spur surgery Bilateral    x 3  . CARDIOVASCULAR STRESS TEST  02/08/2014  . FINGER SURGERY Left    left third finger mucoid cyst removed  . FOOT NEUROMA SURGERY Bilateral   . HYSTEROSCOPY     D&C PMB Endo Cx Polyp  . KNEE ARTHROSCOPY WITH MENISCAL REPAIR Right 2016   --Flossie Dibble, MD  . LAPAROSCOPIC HYSTERECTOMY  7/209/10   R-TLH/BSO  Fibroids, BAck Pain, Adenomyosis  . LUMBAR FUSION    . removal vaginal septum    . TUBAL LIGATION      Current Outpatient Prescriptions  Medication Sig Dispense Refill  . albuterol (PROVENTIL HFA;VENTOLIN HFA) 108 (90 BASE) MCG/ACT inhaler Inhale 2 puffs into the lungs every 6 (six) hours as needed for shortness of breath.     Marland Kitchen aspirin EC 81 MG tablet Take 81 mg by mouth daily.    . budesonide-formoterol (SYMBICORT) 160-4.5 MCG/ACT inhaler Inhale 2 puffs into the lungs as needed.     . cetirizine (ZYRTEC) 10 MG tablet Take 10 mg by mouth daily.    Marland Kitchen  cholecalciferol (VITAMIN D) 1000 UNITS tablet Take 1,000 Units by mouth daily.    Marland Kitchen doxazosin (CARDURA) 4 MG tablet Take by mouth daily.     Marland Kitchen estradiol (ESTRACE) 1 MG tablet Take 1 tablet (1 mg total) by mouth daily. 30 tablet 0  . fluticasone (FLONASE) 50 MCG/ACT nasal spray Place 2 sprays into both nostrils as needed.     . furosemide (LASIX) 20 MG tablet Take 20 mg by mouth daily.    Marland Kitchen losartan (COZAAR) 100 MG tablet Take 100 mg by mouth daily.    . Multiple Vitamins-Minerals (MULTIVITAMIN PO) Take 1 tablet by mouth daily.    . niacin (NIASPAN) 1000 MG CR tablet Take 1,000 mg by mouth at bedtime.    . pantoprazole (PROTONIX) 40 MG tablet Take 1 tablet (40 mg total) by mouth 2 (two) times daily. 180 tablet 3  . PARoxetine (PAXIL) 40 MG tablet Take 40 mg by mouth every morning.    . potassium chloride SA (K-DUR,KLOR-CON) 20 MEQ tablet Take 20 mEq by mouth daily.    . Probiotic Product (PROBIOTIC  DAILY PO) Take by mouth daily.    . ranitidine (ZANTAC) 300 MG tablet TAKE 1 TABLET (300 MG TOTAL) BY MOUTH AT BEDTIME. 30 tablet 3  . traZODone (DESYREL) 150 MG tablet Take 150 mg by mouth at bedtime.    . vitamin B-12 (CYANOCOBALAMIN) 1000 MCG tablet Take 1,000 mcg by mouth daily.     Current Facility-Administered Medications  Medication Dose Route Frequency Provider Last Rate Last Dose  . 0.9 %  sodium chloride infusion  500 mL Intravenous Continuous Nandigam, Venia Minks, MD        Family History  Problem Relation Age of Onset  . Heart failure Father        Possibly related to EtOH  . Alcohol abuse Father   . Emphysema Father   . Emphysema Mother   . Heart failure Mother        Possibly related to EtOH  . Diabetes Mother   . Alcohol abuse Mother   . Valvular heart disease Brother   . Bipolar disorder Brother   . Crohn's disease Daughter   . Thyroid disease Daughter   . Colon cancer Neg Hx   . Stomach cancer Neg Hx   . Esophageal cancer Neg Hx   . Rectal cancer Neg Hx   . Liver cancer Neg Hx     ROS:  Pertinent items are noted in HPI.  Otherwise, a comprehensive ROS was negative.  Exam:   BP 110/60 (BP Location: Right Arm, Patient Position: Sitting, Cuff Size: Normal)   Pulse 92   Resp 16   Ht 5\' 2"  (1.575 m)   Wt 185 lb (83.9 kg)   BMI 33.84 kg/m     General appearance: alert, cooperative and appears stated age Head: Normocephalic, without obvious abnormality, atraumatic Neck: no adenopathy, supple, symmetrical, trachea midline and thyroid normal to inspection and palpation Lungs: clear to auscultation bilaterally Breasts: normal appearance, no masses or tenderness, No nipple retraction or dimpling, No nipple discharge or bleeding, No axillary or supraclavicular adenopathy Heart: regular rate and rhythm Abdomen: soft, non-tender; no masses, no organomegaly Extremities: extremities normal, atraumatic, no cyanosis or edema Skin: Skin color, texture, turgor normal. No  rashes or lesions Lymph nodes: Cervical, supraclavicular, and axillary nodes normal. No abnormal inguinal nodes palpated Neurologic: Grossly normal  Pelvic: External genitalia:  no lesions  Urethra:  normal appearing urethra with no masses, tenderness or lesions              Bartholins and Skenes: normal                 Vagina: normal appearing vagina with normal color and discharge, no lesions.  First degree cystourethrocele.  There is a prominent fold of mucosa under the urethral.  I don't think this is a diverticulum.  It is nontender.               Cervix:  Absent.               Pap taken: No. Bimanual Exam:  Uterus:   Absent.               Adnexa: no mass, fullness, tenderness              Rectal exam: Yes.  .  Confirms.              Anus:  normal sphincter tone, no lesions  Chaperone was present for exam.  Assessment:   Well woman visit with normal exam. Status post robotic TLH/BSO.  On ERT.   Patient increasing dosage to deal with stress.  Stress and anxiety.  HTN, hyperlipidemia, hx elevated glucose.  Plan: Mammogram screening discussed. Recommended self breast awareness. Pap and HR HPV as above. Guidelines for Calcium, Vitamin D, regular exercise program including cardiovascular and weight bearing exercise. We reviewed the [facility] data and ERT and increased risk of DVT, PE, and stroke.  I recommend that she decrease the dosage and convert to a transdermal form of estrogen.  Will start Vivelle Dot 0.05 mg twice weekly for one year.  My goal is to have her transition off of this due to her risk factors for a cardiovascular event.  I recommend she see her psychiatrist and psychologist to modify her medication to treat depression and anxiety instead of increasing the estrogen.  She wants to see how she does on the transdermal estrogen first.  Follow up annually and prn.   After visit summary provided.

## 2017-03-07 NOTE — Patient Instructions (Signed)

## 2017-03-14 NOTE — Telephone Encounter (Signed)
Has a PA been done for this patient?

## 2017-03-14 NOTE — Telephone Encounter (Signed)
Patient dropped of a paper from her pharmacy requesting prior authorization for her medication.

## 2017-03-17 ENCOUNTER — Other Ambulatory Visit: Payer: Self-pay | Admitting: Gastroenterology

## 2017-03-19 NOTE — Telephone Encounter (Signed)
Ok for Minivelle 0.05 mg to skin twice weekly.  Dispense:  8, RF 11.

## 2017-03-19 NOTE — Telephone Encounter (Signed)
PA for Estradiol 0.05 mg patch has been completed and was denied by patient's insurance company on 02/03/2017 (See media tab). Per pharmacy the preferred alternatives for her plan are Estradiol DIS 0.05 mg, Minivelle DIS 0.05 mg, Alora DIS 0.05 mg.  Routing to Dr.Silva for review.

## 2017-03-20 MED ORDER — ESTRADIOL 0.05 MG/24HR TD PTTW: 1.0000 | MEDICATED_PATCH | TRANSDERMAL | 11 refills | Status: DC

## 2017-03-20 NOTE — Telephone Encounter (Signed)
Spoke with patient. Advised of need to switch to an alternative for insurance coverage due to PA denial last month. Patient is agreeable. New rx for Minivelle 0.05 mg twice weekly #[id number] sent to pharmacy on file. Patient is agreeable.  Routing to provider for final review. Patient agreeable to disposition. Will close encounter.

## 2017-04-16 ENCOUNTER — Ambulatory Visit (AMBULATORY_SURGERY_CENTER): Payer: Self-pay | Admitting: *Deleted

## 2017-04-16 VITALS — Ht 62.0 in | Wt 181.0 lb

## 2017-04-16 DIAGNOSIS — J40 Bronchitis, not specified as acute or chronic: Secondary | ICD-10-CM | POA: Insufficient documentation

## 2017-04-16 DIAGNOSIS — Z86010 Personal history of colonic polyps: Secondary | ICD-10-CM

## 2017-04-16 MED ORDER — NA SULFATE-K SULFATE-MG SULF 17.5-3.13-1.6 GM/177ML PO SOLN: ORAL | 0 refills | Status: DC

## 2017-04-16 NOTE — Progress Notes (Signed)
Patient denies any allergies to eggs or soy. Patient denies any problems with anesthesia/sedation. Patient denies any oxygen use at home and does not take any diet/weight loss medications. EMMI education assisgned to patient on colonoscopy, this was explained and instructions given to patient. 

## 2017-04-30 ENCOUNTER — Encounter: Payer: Self-pay | Admitting: Gastroenterology

## 2017-04-30 ENCOUNTER — Ambulatory Visit (AMBULATORY_SURGERY_CENTER): Payer: Medicare Other | Admitting: Gastroenterology

## 2017-04-30 VITALS — BP 142/65 | HR 65 | Temp 97.7°F | Resp 11 | Ht 62.0 in | Wt 181.0 lb

## 2017-04-30 DIAGNOSIS — D123 Benign neoplasm of transverse colon: Secondary | ICD-10-CM

## 2017-04-30 DIAGNOSIS — Z86010 Personal history of colonic polyps: Secondary | ICD-10-CM

## 2017-04-30 MED ORDER — SODIUM CHLORIDE 0.9 % IV SOLN: 500.0000 mL | INTRAVENOUS | Status: DC

## 2017-04-30 NOTE — Patient Instructions (Signed)
YOU HAD AN ENDOSCOPIC PROCEDURE TODAY AT [facility]:   Refer to the procedure report that was given to you for any specific questions about what was found during the examination.  If the procedure report does not answer your questions, please call your gastroenterologist to clarify.  If you requested that your care partner not be given the details of your procedure findings, then the procedure report has been included in a sealed envelope for you to review at your convenience later.  YOU SHOULD EXPECT: Some feelings of bloating in the abdomen. Passage of more gas than usual.  Walking can help get rid of the air that was put into your GI tract during the procedure and reduce the bloating. If you had a lower endoscopy (such as a colonoscopy or flexible sigmoidoscopy) you may notice spotting of blood in your stool or on the toilet paper. If you underwent a bowel prep for your procedure, you may not have a normal bowel movement for a few days.  Please Note:  You might notice some irritation and congestion in your nose or some drainage.  This is from the oxygen used during your procedure.  There is no need for concern and it should clear up in a day or so.  SYMPTOMS TO REPORT IMMEDIATELY:   Following lower endoscopy (colonoscopy or flexible sigmoidoscopy):  Excessive amounts of blood in the stool  Significant tenderness or worsening of abdominal pains  Swelling of the abdomen that is new, acute  Fever of 100F or higher    For urgent or emergent issues, a gastroenterologist can be reached at any hour by calling [phone].   DIET:  We do recommend a small meal at first, but then you may proceed to your regular diet.  Drink plenty of fluids but you should avoid alcoholic beverages for 24 hours.  ACTIVITY:  You should plan to take it easy for the rest of today and you should NOT DRIVE or use heavy machinery until tomorrow (because of the sedation medicines used during the test).     FOLLOW UP: Our staff will call the number listed on your records the next business day following your procedure to check on you and address any questions or concerns that you may have regarding the information given to you following your procedure. If we do not reach you, we will leave a message.  However, if you are feeling well and you are not experiencing any problems, there is no need to return our call.  We will assume that you have returned to your regular daily activities without incident.  If any biopsies were taken you will be contacted by phone or by letter within the next 1-3 weeks.  Please call us at [phone] if you have not heard about the biopsies in 3 weeks.    SIGNATURES/CONFIDENTIALITY: You and/or your care partner have signed paperwork which will be entered into your electronic medical record.  These signatures attest to the fact that that the information above on your After Visit Summary has been reviewed and is understood.  Full responsibility of the confidentiality of this discharge information lies with you and/or your care-partner.   Resume medications. Information given on diverticulosis,hemorrhoids and polyps.

## 2017-04-30 NOTE — Progress Notes (Signed)
Called to room to assist during endoscopic procedure.  Patient ID and intended procedure confirmed with present staff. Received instructions for my participation in the procedure from the performing physician.  

## 2017-04-30 NOTE — Progress Notes (Signed)
A and O x3. Report to RN. Tolerated MAC anesthesia well.

## 2017-04-30 NOTE — Op Note (Signed)
[place] Patient Name: Shelley Spencer Procedure Date: 04/30/2017 10:43 AM MRN: [id number] Endoscopist: Mauri Pole , MD Age: 71 Referring MD:  Date of Birth: Jul 07, 1946 Gender: Female Account #: [id number] Procedure:                Colonoscopy Indications:              High risk colon cancer surveillance: Personal                            history of colonic polyps Medicines:                Monitored Anesthesia Care Procedure:                Pre-Anesthesia Assessment:                           - Prior to the procedure, a History and Physical                            was performed, and patient medications and                            allergies were reviewed. The patient's tolerance of                            previous anesthesia was also reviewed. The risks                            and benefits of the procedure and the sedation                            options and risks were discussed with the patient.                            All questions were answered, and informed consent                            was obtained. Prior Anticoagulants: The patient has                            taken no previous anticoagulant or antiplatelet                            agents. ASA Grade Assessment: II - A patient with                            mild systemic disease. After reviewing the risks                            and benefits, the patient was deemed in                            satisfactory condition to undergo the procedure.  After obtaining informed consent, the colonoscope                            was passed under direct vision. Throughout the                            procedure, the patient's blood pressure, pulse, and                            oxygen saturations were monitored continuously. The                            Colonoscope was introduced through the anus and                            advanced to the the cecum, identified  by                            appendiceal orifice and ileocecal valve. The                            colonoscopy was performed without difficulty. The                            patient tolerated the procedure well. The quality                            of the bowel preparation was good. The ileocecal                            valve, appendiceal orifice, and rectum were                            photographed. Scope In: 10:53:48 AM Scope Out: 11:14:53 AM Scope Withdrawal Time: 0 hours 13 minutes 50 seconds  Total Procedure Duration: 0 hours 21 minutes 5 seconds  Findings:                 The perianal and digital rectal examinations were                            normal.                           A 5 mm polyp was found in the transverse colon. The                            polyp was sessile. The polyp was removed with a                            cold biopsy forceps. Resection and retrieval were                            complete.  A few small-mouthed diverticula were found in the                            sigmoid colon, descending colon, transverse colon                            and ascending colon.                           Non-bleeding internal hemorrhoids were found during                            retroflexion. The hemorrhoids were small. Complications:            No immediate complications. Estimated Blood Loss:     Estimated blood loss was minimal. Impression:               - One 5 mm polyp in the transverse colon, removed                            with a cold biopsy forceps. Resected and retrieved.                           - Diverticulosis in the sigmoid colon, in the                            descending colon, in the transverse colon and in                            the ascending colon.                           - Non-bleeding internal hemorrhoids. Recommendation:           - Patient has a contact number available for                             emergencies. The signs and symptoms of potential                            delayed complications were discussed with the                            patient. Return to normal activities tomorrow.                            Written discharge instructions were provided to the                            patient.                           - Resume previous diet.                           - Continue present medications.                           -  Await pathology results.                           - Repeat colonoscopy in 5 years for surveillance                            based on pathology results. Mauri Pole, MD 04/30/2017 11:19:47 AM This report has been signed electronically.

## 2017-05-01 ENCOUNTER — Telehealth: Payer: Self-pay

## 2017-05-01 NOTE — Telephone Encounter (Signed)
  Follow up Call-  Call back number 04/30/2017 08/28/2016  Post procedure Call Back phone  # [phone]  Permission to leave phone message Yes Yes  Some recent data might be hidden     Patient questions:  Do you have a fever, pain , or abdominal swelling? No. Pain Score  0 *  Have you tolerated food without any problems? Yes.    Have you been able to return to your normal activities? Yes.    Do you have any questions about your discharge instructions: Diet   No. Medications  No. Follow up visit  No.  Do you have questions or concerns about your Care? No.  Actions: * If pain score is 4 or above: No action needed, pain <4.

## 2017-05-05 ENCOUNTER — Telehealth: Payer: Self-pay | Admitting: Gastroenterology

## 2017-05-05 MED ORDER — RANITIDINE HCL 300 MG PO TABS: 300.0000 mg | ORAL_TABLET | Freq: Every day | ORAL | 3 refills | Status: DC

## 2017-05-05 NOTE — Telephone Encounter (Signed)
90 day supply sent to pharmacy

## 2017-05-08 ENCOUNTER — Other Ambulatory Visit: Payer: Self-pay | Admitting: Internal Medicine

## 2017-05-08 DIAGNOSIS — M5030 Other cervical disc degeneration, unspecified cervical region: Secondary | ICD-10-CM

## 2017-05-12 ENCOUNTER — Encounter: Payer: Self-pay | Admitting: Gastroenterology

## 2017-07-14 ENCOUNTER — Other Ambulatory Visit: Payer: Self-pay | Admitting: Neurosurgery

## 2017-08-29 NOTE — Pre-Procedure Instructions (Signed)
Shelley Spencer  08/29/2017      [facility], [place] [address] [place] [address] Phone: [phone] Fax: [phone]    Your procedure is scheduled on December 14  Report to [facility] [place].  Call this number if you have problems the morning of surgery:  [phone]   Remember:  Do not eat food or drink liquids after midnight.  Continue all medications as directed by your physician except follow these medication instructions before surgery below   Take these medicines the morning of surgery with A SIP OF WATER  acetaminophen (TYLENOL) albuterol (PROVENTIL HFA;VENTOLIN HFA)  budesonide-formoterol (SYMBICORT) doxazosin (CARDURA)  fluticasone (FLONASE) PARoxetine (PAXIL)  7 days prior to surgery STOP taking any Aspirin(unless otherwise instructed by your surgeon), Aleve, Naproxen, Ibuprofen, Motrin, Advil, Goody's, BC's, all herbal medications, fish oil, and all vitamins  Follow your doctors instructions regarding your Aspirin.  If no instructions were given by your doctor, then you will need to call the prescribing office office to get instructions.      Do not wear jewelry, make-up or nail polish.  Do not wear lotions, powders, or perfumes, or deoderant.  Do not shave 48 hours prior to surgery.  Men may shave face and neck.  Do not bring valuables to the hospital.  [facility] is not responsible for any belongings or valuables.  Contacts, dentures or bridgework may not be worn into surgery.  Leave your suitcase in the car.  After surgery it may be brought to your room.  For patients admitted to the hospital, discharge time will be determined by your treatment team.  Patients discharged the day of surgery will not be allowed to drive home.    Special instructions:   - Preparing For Surgery  Before surgery, you can play an important role. Because skin is not sterile, your skin  needs to be as free of germs as possible. You can reduce the number of germs on your skin by washing with CHG (chlorahexidine gluconate) Soap before surgery.  CHG is an antiseptic cleaner which kills germs and bonds with the skin to continue killing germs even after washing.  Please do not use if you have an allergy to CHG or antibacterial soaps. If your skin becomes reddened/irritated stop using the CHG.  Do not shave (including legs and underarms) for at least 48 hours prior to first CHG shower. It is OK to shave your face.  Please follow these instructions carefully.   1. Shower the NIGHT BEFORE SURGERY and the MORNING OF SURGERY with CHG.   2. If you chose to wash your hair, wash your hair first as usual with your normal shampoo.  3. After you shampoo, rinse your hair and body thoroughly to remove the shampoo.  4. Use CHG as you would any other liquid soap. You can apply CHG directly to the skin and wash gently with a scrungie or a clean washcloth.   5. Apply the CHG Soap to your body ONLY FROM THE NECK DOWN.  Do not use on open wounds or open sores. Avoid contact with your eyes, ears, mouth and genitals (private parts). Wash Face and genitals (private parts)  with your normal soap.  6. Wash thoroughly, paying special attention to the area where your surgery will be performed.  7. Thoroughly rinse your body with warm water from the neck down.  8. DO NOT shower/wash with your  normal soap after using and rinsing off the CHG Soap.  9. Pat yourself dry with a CLEAN TOWEL.  10. Wear CLEAN PAJAMAS to bed the night before surgery, wear comfortable clothes the morning of surgery  11. Place CLEAN SHEETS on your bed the night of your first shower and DO NOT SLEEP WITH PETS.    Day of Surgery: Do not apply any deodorants/lotions. Please wear clean clothes to the hospital/surgery center.      Please read over the following fact sheets that you were given.

## 2017-09-01 ENCOUNTER — Other Ambulatory Visit (HOSPITAL_COMMUNITY): Payer: Self-pay | Admitting: *Deleted

## 2017-09-01 ENCOUNTER — Inpatient Hospital Stay (HOSPITAL_COMMUNITY)
Admission: RE | Admit: 2017-09-01 | Discharge: 2017-09-01 | Disposition: A | Discharge status: Home / Self Care | Payer: Medicare Other | Source: Ambulatory Visit

## 2017-09-02 ENCOUNTER — Encounter (HOSPITAL_COMMUNITY): Payer: Self-pay

## 2017-09-02 ENCOUNTER — Encounter (HOSPITAL_COMMUNITY)
Admission: RE | Admit: 2017-09-02 | Discharge: 2017-09-02 | Disposition: A | Discharge status: Home / Self Care | Payer: Medicare Other | Source: Ambulatory Visit | Attending: Neurosurgery | Admitting: Neurosurgery

## 2017-09-02 ENCOUNTER — Other Ambulatory Visit: Payer: Self-pay

## 2017-09-02 LAB — HEMOGLOBIN A1C
Hgb A1c MFr Bld: 5.7 % — ABNORMAL HIGH (ref 4.8–5.6)
Mean Plasma Glucose: 116.89 mg/dL

## 2017-09-02 LAB — BASIC METABOLIC PANEL
Anion gap: 8 (ref 5–15)
BUN: 14 mg/dL (ref 6–20)
CO2: 26 mmol/L (ref 22–32)
Calcium: 8.9 mg/dL (ref 8.9–10.3)
Chloride: 105 mmol/L (ref 101–111)
Creatinine, Ser: 1.27 mg/dL — ABNORMAL HIGH (ref 0.44–1.00)
GFR calc Af Amer: 49 mL/min — ABNORMAL LOW (ref >60)
GFR calc non Af Amer: 42 mL/min — ABNORMAL LOW (ref >60)
Glucose, Bld: 88 mg/dL (ref 65–99)
Potassium: 3.2 mmol/L — ABNORMAL LOW (ref 3.5–5.1)
Sodium: 139 mmol/L (ref 135–145)

## 2017-09-02 LAB — CBC
HCT: 41.5 % (ref 36.0–46.0)
Hemoglobin: 13.4 g/dL (ref 12.0–15.0)
MCH: 27 pg (ref 26.0–34.0)
MCHC: 32.3 g/dL (ref 30.0–36.0)
MCV: 83.5 fL (ref 78.0–100.0)
Platelets: 232 10*3/uL (ref 150–400)
RBC: 4.97 MIL/uL (ref 3.87–5.11)
RDW: 14.5 % (ref 11.5–15.5)
WBC: 6.4 10*3/uL (ref 4.0–10.5)

## 2017-09-02 LAB — SURGICAL PCR SCREEN
MRSA, PCR: NEGATIVE
Staphylococcus aureus: NEGATIVE

## 2017-09-02 NOTE — Pre-Procedure Instructions (Signed)
Shelley Spencer  09/02/2017    Your procedure is scheduled on Friday, September 05, 2017 at 7:30 AM.   Report to [facility] Entrance "A" Admitting Office at 5:30 Am.   Call this number if you have problems the morning of surgery: [phone]   Questions prior to day of surgery, please call [phone] between 8 & 4 PM.   Remember:  Do not eat food or drink liquids after midnight Thursday, 09/04/17.             Take these medicines the morning of surgery with A SIP OF WATER: Cetirizine (Zyrtec), Doxazosin (Cardura), Pantoprazole (Protonix), Paroxetine (Paxil), Tylenol - if needed, Flonase - if needed, Symbicort inhaler - if needed, Albuterol inhaler - if needed (bring this inhaler with you day of surgery).  Stop Multivitamins and Aspirin as of today.   Do not wear jewelry, make-up or nail polish.  Do not wear lotions, powders, perfumes or deoderant.  Do not shave 48 hours prior to surgery.    Do not bring valuables to the hospital.  [facility] is not responsible for any belongings or valuables.  Contacts, dentures or bridgework may not be worn into surgery.  Leave your suitcase in the car.  After surgery it may be brought to your room.  For patients admitted to the hospital, discharge time will be determined by your treatment team.  [facility]- Preparing For Surgery  Before surgery, you can play an important role. Because skin is not sterile, your skin needs to be as free of germs as possible. You can reduce the number of germs on your skin by washing with CHG (chlorahexidine gluconate) Soap before surgery.  CHG is an antiseptic cleaner which kills germs and bonds with the skin to continue killing germs even after washing.  Please do not use if you have an allergy to CHG or antibacterial soaps. If your skin becomes reddened/irritated stop using the CHG.  Do not shave (including legs and underarms) for at least 48 hours prior to first CHG shower. It is OK to shave your  face.  Please follow these instructions carefully.   1. Shower the NIGHT BEFORE SURGERY and the MORNING OF SURGERY with CHG.   2. If you chose to wash your hair, wash your hair first as usual with your normal shampoo.  3. After you shampoo, rinse your hair and body thoroughly to remove the shampoo.  4. Use CHG as you would any other liquid soap. You can apply CHG directly to the skin and wash gently with a scrungie or a clean washcloth.   5. Apply the CHG Soap to your body ONLY FROM THE NECK DOWN.  Do not use on open wounds or open sores. Avoid contact with your eyes, ears, mouth and genitals (private parts). Wash Face and genitals (private parts)  with your normal soap.  6. Wash thoroughly, paying special attention to the area where your surgery will be performed.  7. Thoroughly rinse your body with warm water from the neck down.  8. DO NOT shower/wash with your normal soap after using and rinsing off the CHG Soap.  9. Pat yourself dry with a CLEAN TOWEL.  10. Wear CLEAN PAJAMAS to bed the night before surgery.  11. Place CLEAN SHEETS on your bed the night of your first shower and DO NOT SLEEP WITH PETS.    Day of Surgery: Do not apply any deodorants/lotions. Please wear clean clothes to the hospital.  Please read over the fact sheets that you were given.

## 2017-09-02 NOTE — Progress Notes (Signed)
Pt denies cardiac history. States she was once told she had a heart murmur, ?MVP. She states she had an Echo in 2008 and was told she did not have MVP or murmur. Pt is pre-diabetic. States she does not check her blood sugar at home and only has an A1C once a year.

## 2017-09-03 LAB — TYPE AND SCREEN
ABO/RH(D): B POS
Antibody Screen: NEGATIVE

## 2017-09-03 LAB — ABO/RH: ABO/RH(D): B POS

## 2017-09-05 ENCOUNTER — Inpatient Hospital Stay (HOSPITAL_COMMUNITY): Payer: Medicare Other | Admitting: Certified Registered"

## 2017-09-05 ENCOUNTER — Inpatient Hospital Stay (HOSPITAL_COMMUNITY)
Admission: RE | Admit: 2017-09-05 | Discharge: 2017-09-06 | DRG: 473 | Disposition: A | Discharge status: Home / Self Care | Payer: Medicare Other | Source: Ambulatory Visit | Attending: Neurosurgery | Admitting: Neurosurgery

## 2017-09-05 ENCOUNTER — Encounter (HOSPITAL_COMMUNITY): Payer: Self-pay | Admitting: General Practice

## 2017-09-05 ENCOUNTER — Encounter (HOSPITAL_COMMUNITY)
Admission: RE | Disposition: A | Discharge status: Home / Self Care | Payer: Self-pay | Source: Ambulatory Visit | Attending: Neurosurgery

## 2017-09-05 ENCOUNTER — Inpatient Hospital Stay (HOSPITAL_COMMUNITY): Payer: Medicare Other

## 2017-09-05 ENCOUNTER — Other Ambulatory Visit: Payer: Self-pay

## 2017-09-05 DIAGNOSIS — M4602 Spinal enthesopathy, cervical region: Secondary | ICD-10-CM | POA: Diagnosis present

## 2017-09-05 DIAGNOSIS — I10 Essential (primary) hypertension: Secondary | ICD-10-CM | POA: Diagnosis present

## 2017-09-05 DIAGNOSIS — M50122 Cervical disc disorder at C5-C6 level with radiculopathy: Secondary | ICD-10-CM | POA: Diagnosis present

## 2017-09-05 DIAGNOSIS — M50121 Cervical disc disorder at C4-C5 level with radiculopathy: Secondary | ICD-10-CM | POA: Diagnosis present

## 2017-09-05 DIAGNOSIS — M50123 Cervical disc disorder at C6-C7 level with radiculopathy: Secondary | ICD-10-CM | POA: Diagnosis present

## 2017-09-05 DIAGNOSIS — M5020 Other cervical disc displacement, unspecified cervical region: Secondary | ICD-10-CM | POA: Diagnosis present

## 2017-09-05 DIAGNOSIS — M50221 Other cervical disc displacement at C4-C5 level: Secondary | ICD-10-CM | POA: Diagnosis present

## 2017-09-05 DIAGNOSIS — Z419 Encounter for procedure for purposes other than remedying health state, unspecified: Secondary | ICD-10-CM

## 2017-09-05 DIAGNOSIS — Z87891 Personal history of nicotine dependence: Secondary | ICD-10-CM | POA: Diagnosis not present

## 2017-09-05 DIAGNOSIS — M542 Cervicalgia: Secondary | ICD-10-CM | POA: Diagnosis present

## 2017-09-05 HISTORY — PX: ANTERIOR CERVICAL DECOMP/DISCECTOMY FUSION: SHX1161

## 2017-09-05 SURGERY — ANTERIOR CERVICAL DECOMPRESSION/DISCECTOMY FUSION 3 LEVELS
Anesthesia: General | Site: Neck

## 2017-09-05 MED ORDER — ONDANSETRON HCL 4 MG PO TABS: 4.0000 mg | ORAL_TABLET | Freq: Four times a day (QID) | ORAL | Status: DC | PRN

## 2017-09-05 MED ORDER — LOSARTAN POTASSIUM 50 MG PO TABS
100.0000 mg | ORAL_TABLET | Freq: Every day | ORAL | Status: DC
  Administered 2017-09-05 – 2017-09-06 (×2): 100 mg via ORAL
  Filled 2017-09-05 (×2): qty 2

## 2017-09-05 MED ORDER — ONDANSETRON HCL 4 MG/2ML IJ SOLN: 4.0000 mg | Freq: Four times a day (QID) | INTRAMUSCULAR | Status: DC | PRN

## 2017-09-05 MED ORDER — PANTOPRAZOLE SODIUM 40 MG PO TBEC
40.0000 mg | DELAYED_RELEASE_TABLET | Freq: Two times a day (BID) | ORAL | Status: DC
  Administered 2017-09-06: 40 mg via ORAL
  Filled 2017-09-05 (×2): qty 1

## 2017-09-05 MED ORDER — CHLORHEXIDINE GLUCONATE CLOTH 2 % EX PADS: 6.0000 | MEDICATED_PAD | Freq: Once | CUTANEOUS | Status: DC

## 2017-09-05 MED ORDER — NIACIN ER (ANTIHYPERLIPIDEMIC) 500 MG PO TBCR
1000.0000 mg | EXTENDED_RELEASE_TABLET | Freq: Every day | ORAL | Status: DC
  Filled 2017-09-05: qty 2

## 2017-09-05 MED ORDER — MORPHINE SULFATE (PF) 2 MG/ML IV SOLN: 2.0000 mg | INTRAVENOUS | Status: DC | PRN

## 2017-09-05 MED ORDER — LIDOCAINE 2% (20 MG/ML) 5 ML SYRINGE
INTRAMUSCULAR | Status: DC | PRN
  Administered 2017-09-05: 60 mg via INTRAVENOUS

## 2017-09-05 MED ORDER — BISACODYL 10 MG RE SUPP: 10.0000 mg | Freq: Every day | RECTAL | Status: DC | PRN

## 2017-09-05 MED ORDER — PROPOFOL 10 MG/ML IV BOLUS
INTRAVENOUS | Status: AC
  Filled 2017-09-05: qty 20

## 2017-09-05 MED ORDER — LIDOCAINE-EPINEPHRINE 1 %-1:100000 IJ SOLN
INTRAMUSCULAR | Status: AC
  Filled 2017-09-05: qty 1

## 2017-09-05 MED ORDER — ONDANSETRON HCL 4 MG/2ML IJ SOLN
INTRAMUSCULAR | Status: AC
  Filled 2017-09-05: qty 2

## 2017-09-05 MED ORDER — THROMBIN (RECOMBINANT) 20000 UNITS EX SOLR
CUTANEOUS | Status: AC
  Filled 2017-09-05: qty 20000

## 2017-09-05 MED ORDER — HYDROCODONE-ACETAMINOPHEN 5-325 MG PO TABS: 2.0000 | ORAL_TABLET | ORAL | Status: DC | PRN

## 2017-09-05 MED ORDER — SUGAMMADEX SODIUM 200 MG/2ML IV SOLN
INTRAVENOUS | Status: DC | PRN
  Administered 2017-09-05: 200 mg via INTRAVENOUS

## 2017-09-05 MED ORDER — BUPIVACAINE HCL (PF) 0.5 % IJ SOLN
INTRAMUSCULAR | Status: AC
  Filled 2017-09-05: qty 30

## 2017-09-05 MED ORDER — THROMBIN (RECOMBINANT) 5000 UNITS EX SOLR
CUTANEOUS | Status: AC
  Filled 2017-09-05: qty 5000

## 2017-09-05 MED ORDER — ALBUTEROL SULFATE (2.5 MG/3ML) 0.083% IN NEBU: 2.5000 mg | INHALATION_SOLUTION | Freq: Four times a day (QID) | RESPIRATORY_TRACT | Status: DC | PRN

## 2017-09-05 MED ORDER — POLYETHYLENE GLYCOL 3350 17 G PO PACK: 17.0000 g | PACK | Freq: Every day | ORAL | Status: DC | PRN

## 2017-09-05 MED ORDER — LIDOCAINE 2% (20 MG/ML) 5 ML SYRINGE
INTRAMUSCULAR | Status: AC
  Filled 2017-09-05: qty 5

## 2017-09-05 MED ORDER — FUROSEMIDE 20 MG PO TABS
20.0000 mg | ORAL_TABLET | Freq: Every day | ORAL | Status: DC
  Filled 2017-09-05: qty 1

## 2017-09-05 MED ORDER — MOMETASONE FURO-FORMOTEROL FUM 200-5 MCG/ACT IN AERO
2.0000 | INHALATION_SPRAY | Freq: Two times a day (BID) | RESPIRATORY_TRACT | Status: DC
  Filled 2017-09-05: qty 8.8

## 2017-09-05 MED ORDER — SUFENTANIL CITRATE 50 MCG/ML IV SOLN
INTRAVENOUS | Status: DC | PRN
  Administered 2017-09-05: 20 ug via INTRAVENOUS

## 2017-09-05 MED ORDER — METHOCARBAMOL 500 MG PO TABS
500.0000 mg | ORAL_TABLET | Freq: Four times a day (QID) | ORAL | Status: DC | PRN
  Administered 2017-09-05 (×3): 500 mg via ORAL
  Filled 2017-09-05 (×2): qty 1

## 2017-09-05 MED ORDER — FLUTICASONE PROPIONATE 50 MCG/ACT NA SUSP
2.0000 | Freq: Every day | NASAL | Status: DC | PRN
  Filled 2017-09-05: qty 16

## 2017-09-05 MED ORDER — HEMOSTATIC AGENTS (NO CHARGE) OPTIME
TOPICAL | Status: DC | PRN
  Administered 2017-09-05 (×2): 1 via TOPICAL

## 2017-09-05 MED ORDER — OXYCODONE HCL 5 MG PO TABS
10.0000 mg | ORAL_TABLET | ORAL | Status: DC | PRN
  Administered 2017-09-05 – 2017-09-06 (×4): 10 mg via ORAL
  Filled 2017-09-05 (×4): qty 2

## 2017-09-05 MED ORDER — ROCURONIUM BROMIDE 10 MG/ML (PF) SYRINGE
PREFILLED_SYRINGE | INTRAVENOUS | Status: DC | PRN
  Administered 2017-09-05: 60 mg via INTRAVENOUS

## 2017-09-05 MED ORDER — SUGAMMADEX SODIUM 200 MG/2ML IV SOLN
INTRAVENOUS | Status: AC
  Filled 2017-09-05: qty 2

## 2017-09-05 MED ORDER — ALUM & MAG HYDROXIDE-SIMETH 200-200-20 MG/5ML PO SUSP: 30.0000 mL | Freq: Four times a day (QID) | ORAL | Status: DC | PRN

## 2017-09-05 MED ORDER — DEXAMETHASONE SODIUM PHOSPHATE 10 MG/ML IJ SOLN
INTRAMUSCULAR | Status: AC
  Filled 2017-09-05: qty 1

## 2017-09-05 MED ORDER — MORPHINE SULFATE (PF) 4 MG/ML IV SOLN: 2.0000 mg | INTRAVENOUS | Status: DC | PRN

## 2017-09-05 MED ORDER — LORATADINE 10 MG PO TABS
10.0000 mg | ORAL_TABLET | Freq: Every day | ORAL | Status: DC
  Administered 2017-09-05 – 2017-09-06 (×2): 10 mg via ORAL
  Filled 2017-09-05 (×2): qty 1

## 2017-09-05 MED ORDER — PROPOFOL 10 MG/ML IV BOLUS
INTRAVENOUS | Status: DC | PRN
  Administered 2017-09-05: 130 mg via INTRAVENOUS

## 2017-09-05 MED ORDER — SUFENTANIL CITRATE 50 MCG/ML IV SOLN
INTRAVENOUS | Status: AC
  Filled 2017-09-05: qty 1

## 2017-09-05 MED ORDER — HYDROMORPHONE HCL 1 MG/ML IJ SOLN: 0.2500 mg | INTRAMUSCULAR | Status: DC | PRN

## 2017-09-05 MED ORDER — ACETAMINOPHEN 650 MG RE SUPP
650.0000 mg | RECTAL | Status: DC | PRN
Start: 2017-09-05 — End: 2017-09-06

## 2017-09-05 MED ORDER — MIDAZOLAM HCL 2 MG/2ML IJ SOLN
INTRAMUSCULAR | Status: AC
  Filled 2017-09-05: qty 2

## 2017-09-05 MED ORDER — MENTHOL 3 MG MT LOZG: 1.0000 | LOZENGE | OROMUCOSAL | Status: DC | PRN

## 2017-09-05 MED ORDER — THROMBIN (RECOMBINANT) 5000 UNITS EX SOLR
CUTANEOUS | Status: DC | PRN
  Administered 2017-09-05: 5000 [IU] via TOPICAL

## 2017-09-05 MED ORDER — PHENOL 1.4 % MT LIQD: 1.0000 | OROMUCOSAL | Status: DC | PRN

## 2017-09-05 MED ORDER — DOXAZOSIN MESYLATE 4 MG PO TABS
4.0000 mg | ORAL_TABLET | Freq: Every day | ORAL | Status: DC
  Administered 2017-09-06: 4 mg via ORAL
  Filled 2017-09-05: qty 1

## 2017-09-05 MED ORDER — DEXAMETHASONE SODIUM PHOSPHATE 10 MG/ML IJ SOLN
INTRAMUSCULAR | Status: DC | PRN
  Administered 2017-09-05: 10 mg via INTRAVENOUS

## 2017-09-05 MED ORDER — PANTOPRAZOLE SODIUM 40 MG IV SOLR: 40.0000 mg | Freq: Every day | INTRAVENOUS | Status: DC

## 2017-09-05 MED ORDER — METHOCARBAMOL 500 MG PO TABS
ORAL_TABLET | ORAL | Status: AC
Start: 2017-09-05 — End: 2017-09-05
  Administered 2017-09-05: 500 mg via ORAL
  Filled 2017-09-05: qty 1

## 2017-09-05 MED ORDER — OXYCODONE HCL 5 MG PO TABS
ORAL_TABLET | ORAL | Status: AC
  Administered 2017-09-05: 5 mg via ORAL
  Filled 2017-09-05: qty 1

## 2017-09-05 MED ORDER — ADULT MULTIVITAMIN W/MINERALS CH: 1.0000 | ORAL_TABLET | Freq: Every day | ORAL | Status: DC

## 2017-09-05 MED ORDER — CEFAZOLIN SODIUM-DEXTROSE 2-4 GM/100ML-% IV SOLN
INTRAVENOUS | Status: AC
  Filled 2017-09-05: qty 100

## 2017-09-05 MED ORDER — ACETAMINOPHEN 325 MG PO TABS
650.0000 mg | ORAL_TABLET | ORAL | Status: DC | PRN
  Administered 2017-09-05: 650 mg via ORAL
  Filled 2017-09-05: qty 2

## 2017-09-05 MED ORDER — ESTRADIOL 0.05 MG/24HR TD PTWK: 0.0500 mg | MEDICATED_PATCH | TRANSDERMAL | Status: DC

## 2017-09-05 MED ORDER — 0.9 % SODIUM CHLORIDE (POUR BTL) OPTIME
TOPICAL | Status: DC | PRN
  Administered 2017-09-05: 1000 mL

## 2017-09-05 MED ORDER — SODIUM CHLORIDE 0.9 % IJ SOLN
INTRAMUSCULAR | Status: AC
  Filled 2017-09-05: qty 10

## 2017-09-05 MED ORDER — ONDANSETRON HCL 4 MG/2ML IJ SOLN
INTRAMUSCULAR | Status: DC | PRN
  Administered 2017-09-05: 4 mg via INTRAVENOUS

## 2017-09-05 MED ORDER — BUPIVACAINE HCL (PF) 0.5 % IJ SOLN
INTRAMUSCULAR | Status: DC | PRN
  Administered 2017-09-05: 3 mL

## 2017-09-05 MED ORDER — FAMOTIDINE 20 MG PO TABS
20.0000 mg | ORAL_TABLET | Freq: Two times a day (BID) | ORAL | Status: DC
  Administered 2017-09-05 – 2017-09-06 (×3): 20 mg via ORAL
  Filled 2017-09-05 (×3): qty 1

## 2017-09-05 MED ORDER — MIDAZOLAM HCL 5 MG/5ML IJ SOLN
INTRAMUSCULAR | Status: DC | PRN
  Administered 2017-09-05: 2 mg via INTRAVENOUS

## 2017-09-05 MED ORDER — FLEET ENEMA 7-19 GM/118ML RE ENEM: 1.0000 | ENEMA | Freq: Once | RECTAL | Status: DC | PRN

## 2017-09-05 MED ORDER — CEFAZOLIN SODIUM-DEXTROSE 2-4 GM/100ML-% IV SOLN
2.0000 g | INTRAVENOUS | Status: AC
  Administered 2017-09-05: 2 g via INTRAVENOUS

## 2017-09-05 MED ORDER — CEFAZOLIN SODIUM-DEXTROSE 2-4 GM/100ML-% IV SOLN
2.0000 g | Freq: Three times a day (TID) | INTRAVENOUS | Status: AC
  Administered 2017-09-05 (×2): 2 g via INTRAVENOUS
  Filled 2017-09-05 (×2): qty 100

## 2017-09-05 MED ORDER — VITAMIN B-12 1000 MCG PO TABS
1000.0000 ug | ORAL_TABLET | Freq: Every day | ORAL | Status: DC
  Administered 2017-09-05 – 2017-09-06 (×2): 1000 ug via ORAL
  Filled 2017-09-05 (×2): qty 1

## 2017-09-05 MED ORDER — THROMBIN (RECOMBINANT) 20000 UNITS EX SOLR
CUTANEOUS | Status: DC | PRN
  Administered 2017-09-05: 20000 [IU] via TOPICAL

## 2017-09-05 MED ORDER — OXYCODONE HCL 5 MG PO TABS
5.0000 mg | ORAL_TABLET | ORAL | Status: DC | PRN
  Administered 2017-09-05 (×2): 5 mg via ORAL
  Filled 2017-09-05: qty 1

## 2017-09-05 MED ORDER — SODIUM CHLORIDE 0.9% FLUSH: 3.0000 mL | INTRAVENOUS | Status: DC | PRN

## 2017-09-05 MED ORDER — SODIUM CHLORIDE 0.9% FLUSH
3.0000 mL | Freq: Two times a day (BID) | INTRAVENOUS | Status: DC
  Administered 2017-09-05 (×2): 3 mL via INTRAVENOUS

## 2017-09-05 MED ORDER — PAROXETINE HCL 20 MG PO TABS
40.0000 mg | ORAL_TABLET | ORAL | Status: DC
  Administered 2017-09-05: 40 mg via ORAL
  Filled 2017-09-05 (×2): qty 2

## 2017-09-05 MED ORDER — ASPIRIN EC 81 MG PO TBEC
81.0000 mg | DELAYED_RELEASE_TABLET | Freq: Every day | ORAL | Status: DC
  Administered 2017-09-05: 81 mg via ORAL
  Filled 2017-09-05: qty 1

## 2017-09-05 MED ORDER — KCL IN DEXTROSE-NACL 20-5-0.45 MEQ/L-%-% IV SOLN: INTRAVENOUS | Status: DC

## 2017-09-05 MED ORDER — LACTATED RINGERS IV SOLN
INTRAVENOUS | Status: DC | PRN
  Administered 2017-09-05 (×2): via INTRAVENOUS

## 2017-09-05 MED ORDER — DOCUSATE SODIUM 100 MG PO CAPS
100.0000 mg | ORAL_CAPSULE | Freq: Two times a day (BID) | ORAL | Status: DC
  Administered 2017-09-05 – 2017-09-06 (×2): 100 mg via ORAL
  Filled 2017-09-05 (×2): qty 1

## 2017-09-05 MED ORDER — LIDOCAINE-EPINEPHRINE 1 %-1:100000 IJ SOLN
INTRAMUSCULAR | Status: DC | PRN
  Administered 2017-09-05: 3 mL

## 2017-09-05 MED ORDER — TRAZODONE HCL 150 MG PO TABS
150.0000 mg | ORAL_TABLET | Freq: Every day | ORAL | Status: DC
  Administered 2017-09-05: 150 mg via ORAL
  Filled 2017-09-05: qty 1

## 2017-09-05 MED ORDER — POTASSIUM CHLORIDE CRYS ER 20 MEQ PO TBCR
20.0000 meq | EXTENDED_RELEASE_TABLET | Freq: Every day | ORAL | Status: DC
  Administered 2017-09-05 – 2017-09-06 (×2): 20 meq via ORAL
  Filled 2017-09-05 (×2): qty 1

## 2017-09-05 MED ORDER — ROCURONIUM BROMIDE 10 MG/ML (PF) SYRINGE
PREFILLED_SYRINGE | INTRAVENOUS | Status: AC
  Filled 2017-09-05: qty 5

## 2017-09-05 MED ORDER — DEXTROSE 5 % IV SOLN
500.0000 mg | Freq: Four times a day (QID) | INTRAVENOUS | Status: DC | PRN
  Filled 2017-09-05: qty 5

## 2017-09-05 MED ORDER — ZOLPIDEM TARTRATE 5 MG PO TABS: 5.0000 mg | ORAL_TABLET | Freq: Every evening | ORAL | Status: DC | PRN

## 2017-09-05 SURGICAL SUPPLY — 78 items
ADH SKN CLS APL DERMABOND .7 (GAUZE/BANDAGES/DRESSINGS) ×1
BASKET BONE COLLECTION (BASKET) ×1 IMPLANT
BIT DRILL 12X2.5XAVTR (BIT) IMPLANT
BIT DRILL AVIATOR 12 (BIT) ×2
BIT DRILL NEURO 2X3.1 SFT TUCH (MISCELLANEOUS) ×1 IMPLANT
BIT DRL 12X2.5XAVTR (BIT) ×1
BLADE ULTRA TIP 2M (BLADE) IMPLANT
BNDG GAUZE ELAST 4 BULKY (GAUZE/BANDAGES/DRESSINGS) IMPLANT
BUR BARREL STRAIGHT FLUTE 4.0 (BURR) ×3 IMPLANT
CABLE BIPOLOR RESECTION CORD (MISCELLANEOUS) ×1 IMPLANT
CANISTER SUCT 3000ML PPV (MISCELLANEOUS) ×2 IMPLANT
CARTRIDGE OIL MAESTRO DRILL (MISCELLANEOUS) ×1 IMPLANT
COVER MAYO STAND STRL (DRAPES) ×2 IMPLANT
DECANTER SPIKE VIAL GLASS SM (MISCELLANEOUS) ×3 IMPLANT
DERMABOND ADVANCED (GAUZE/BANDAGES/DRESSINGS) ×1
DERMABOND ADVANCED .7 DNX12 (GAUZE/BANDAGES/DRESSINGS) ×1 IMPLANT
DIFFUSER DRILL AIR PNEUMATIC (MISCELLANEOUS) ×2 IMPLANT
DRAIN JACKSON PRATT 10MM FLAT (MISCELLANEOUS) ×1 IMPLANT
DRAPE HALF SHEET 40X57 (DRAPES) IMPLANT
DRAPE LAPAROTOMY 100X72 PEDS (DRAPES) ×2 IMPLANT
DRAPE MICROSCOPE LEICA (MISCELLANEOUS) ×2 IMPLANT
DRAPE POUCH INSTRU U-SHP 10X18 (DRAPES) ×2 IMPLANT
DRILL NEURO 2X3.1 SOFT TOUCH (MISCELLANEOUS) ×2
DRSG OPSITE POSTOP 3X4 (GAUZE/BANDAGES/DRESSINGS) ×1 IMPLANT
DURAPREP 6ML APPLICATOR 50/CS (WOUND CARE) ×2 IMPLANT
ELECT COATED BLADE 2.86 ST (ELECTRODE) ×2 IMPLANT
ELECT REM PT RETURN 9FT ADLT (ELECTROSURGICAL) ×2
ELECTRODE REM PT RTRN 9FT ADLT (ELECTROSURGICAL) ×1 IMPLANT
EVACUATOR SILICONE 100CC (DRAIN) ×1 IMPLANT
GAUZE SPONGE 4X4 12PLY STRL (GAUZE/BANDAGES/DRESSINGS) IMPLANT
GAUZE SPONGE 4X4 16PLY XRAY LF (GAUZE/BANDAGES/DRESSINGS) ×1 IMPLANT
GLOVE BIO SURGEON STRL SZ8 (GLOVE) ×3 IMPLANT
GLOVE BIO SURGEON STRL SZ8.5 (GLOVE) ×1 IMPLANT
GLOVE BIOGEL PI IND STRL 6.5 (GLOVE) IMPLANT
GLOVE BIOGEL PI IND STRL 8 (GLOVE) ×1 IMPLANT
GLOVE BIOGEL PI IND STRL 8.5 (GLOVE) ×1 IMPLANT
GLOVE BIOGEL PI INDICATOR 6.5 (GLOVE) ×1
GLOVE BIOGEL PI INDICATOR 8 (GLOVE) ×1
GLOVE BIOGEL PI INDICATOR 8.5 (GLOVE) ×1
GLOVE ECLIPSE 8.0 STRL XLNG CF (GLOVE) ×2 IMPLANT
GLOVE EXAM NITRILE LRG STRL (GLOVE) IMPLANT
GLOVE EXAM NITRILE XL STR (GLOVE) IMPLANT
GLOVE EXAM NITRILE XS STR PU (GLOVE) IMPLANT
GLOVE SURG SS PI 6.0 STRL IVOR (GLOVE) ×3 IMPLANT
GOWN STRL REUS W/ TWL LRG LVL3 (GOWN DISPOSABLE) IMPLANT
GOWN STRL REUS W/ TWL XL LVL3 (GOWN DISPOSABLE) ×1 IMPLANT
GOWN STRL REUS W/TWL 2XL LVL3 (GOWN DISPOSABLE) ×2 IMPLANT
GOWN STRL REUS W/TWL LRG LVL3 (GOWN DISPOSABLE) ×2
GOWN STRL REUS W/TWL XL LVL3 (GOWN DISPOSABLE) ×4
HALTER HD/CHIN CERV TRACTION D (MISCELLANEOUS) ×2 IMPLANT
HEMOSTAT POWDER KIT SURGIFOAM (HEMOSTASIS) ×1 IMPLANT
KIT BASIN OR (CUSTOM PROCEDURE TRAY) ×2 IMPLANT
KIT ROOM TURNOVER OR (KITS) ×2 IMPLANT
NDL HYPO 25X1 1.5 SAFETY (NEEDLE) ×1 IMPLANT
NDL SPNL 18GX3.5 QUINCKE PK (NEEDLE) IMPLANT
NDL SPNL 22GX3.5 QUINCKE BK (NEEDLE) ×1 IMPLANT
NEEDLE HYPO 25X1 1.5 SAFETY (NEEDLE) ×2 IMPLANT
NEEDLE SPNL 18GX3.5 QUINCKE PK (NEEDLE) ×2 IMPLANT
NEEDLE SPNL 22GX3.5 QUINCKE BK (NEEDLE) ×2 IMPLANT
NS IRRIG 1000ML POUR BTL (IV SOLUTION) ×2 IMPLANT
OIL CARTRIDGE MAESTRO DRILL (MISCELLANEOUS) ×2
PACK LAMINECTOMY NEURO (CUSTOM PROCEDURE TRAY) ×2 IMPLANT
PAD ARMBOARD 7.5X6 YLW CONV (MISCELLANEOUS) ×6 IMPLANT
PEEK AVS AS 6X12X4 (Peek) ×1 IMPLANT
PIN DISTRACTION 14MM (PIN) ×5 IMPLANT
PLATE AVIATOR ASSY 3LVL SZ 45 (Plate) ×1 IMPLANT
RUBBERBAND STERILE (MISCELLANEOUS) ×4 IMPLANT
SCREW AVIATOR VAR SELFTAP 4X12 (Screw) ×8 IMPLANT
SPACER AVS AS 12X14X5X0 (Orthopedic Implant) ×2 IMPLANT
SPONGE INTESTINAL PEANUT (DISPOSABLE) ×2 IMPLANT
SPONGE SURGIFOAM ABS GEL 100 (HEMOSTASIS) ×2 IMPLANT
STAPLER SKIN PROX WIDE 3.9 (STAPLE) IMPLANT
SUT VIC AB 3-0 SH 8-18 (SUTURE) ×3 IMPLANT
SYR 5ML LL (SYRINGE) IMPLANT
TOWEL GREEN STERILE (TOWEL DISPOSABLE) ×2 IMPLANT
TOWEL GREEN STERILE FF (TOWEL DISPOSABLE) ×2 IMPLANT
TRAY FOLEY W/METER SILVER 16FR (SET/KITS/TRAYS/PACK) IMPLANT
WATER STERILE IRR 1000ML POUR (IV SOLUTION) ×2 IMPLANT

## 2017-09-05 NOTE — Anesthesia Procedure Notes (Signed)
Procedure Name: Intubation Date/Time: 09/05/2017 7:42 AM Performed by: Moshe Salisbury, CRNA Pre-anesthesia Checklist: Patient identified, Emergency Drugs available, Suction available and Patient being monitored Patient Re-evaluated:Patient Re-evaluated prior to induction Oxygen Delivery Method: Circle System Utilized Preoxygenation: Pre-oxygenation with 100% oxygen Induction Type: IV induction Ventilation: Mask ventilation without difficulty Laryngoscope Size: Mac and 3 Grade View: Grade II Tube type: Oral Number of attempts: 1 Airway Equipment and Method: Stylet Placement Confirmation: ETT inserted through vocal cords under direct vision,  positive ETCO2 and breath sounds checked- equal and bilateral Secured at: 21 cm Tube secured with: Tape Dental Injury: Teeth and Oropharynx as per pre-operative assessment

## 2017-09-05 NOTE — Anesthesia Postprocedure Evaluation (Signed)
Anesthesia Post Note  Patient: Shelley Spencer  Procedure(s) Performed: Cervical four-five, Cervical five-six, Cervical six-seven Anterior cervical decompression/discectomy/fusion (N/A Neck)     Patient location during evaluation: PACU Anesthesia Type: General Level of consciousness: awake and alert Pain management: pain level controlled Vital Signs Assessment: post-procedure vital signs reviewed and stable Respiratory status: spontaneous breathing, nonlabored ventilation, respiratory function stable and patient connected to nasal cannula oxygen Cardiovascular status: blood pressure returned to baseline and stable Postop Assessment: no apparent nausea or vomiting Anesthetic complications: no    Last Vitals:  Vitals:   09/05/17 1115 09/05/17 1130  BP: (!) 148/71 (!) 152/77  Pulse: 82 77  Resp: 16 13  Temp:    SpO2: 96% 97%    Last Pain:  Vitals:   09/05/17 1115  TempSrc:   PainSc: 5                  Chessica Audia,W. EDMOND

## 2017-09-05 NOTE — H&P (Signed)
Patient ID:   [id number] Patient: Shelley Spencer  Date of Birth: [birth year] Visit Type: Office Visit   Date: 07/14/2017 02:45 PM Provider: Marchia Meiers. Vertell Limber MD   This 71 year old female presents for neck pain.   History of Present Illness: 1.  neck pain  Patient returns with significant neck pain radiating to the left arm since June. She notes the pain radiates to the elbow. She complains of weakness in the left arm. She reports she went to physical therapy for the pain for two months. This worsened the pain. She denies pain to the back. She grades her neck pain as 2/10.  MRI 05/23/17: Minimal AC joint arthropathy. Minimal degenerative changes of the posterior aspect of the glenoid. Intact rotator cuff. No bursitis.  MRI 04/18/17: Unchanged right subarticular predominant disc osteophyte complex at C5-6 indenting the ventral spinal cord with mild central spinal canal stenosis. 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 Effacement of thecal sac and indentation of the ventral spinal cord is also unchanged at this level.  Physical: Positive Spurling on the left and negative on the right.  Diminished pin on left C5, C6, and C7. Diminished biceps and triceps reflex. Weakness on 4-/5 left deltoid, 4-/5 left biceps, and 4/5 left triceps.          PAST MEDICAL HISTORY, SURGICAL HISTORY, FAMILY HISTORY, SOCIAL HISTORY AND REVIEW OF SYSTEMS I have reviewed the patient's past medical, surgical, family and social history as well as the comprehensive review of systems as included on the [facility] history form dated 11/27/2016, which I have signed.   MEDICATIONS(added, continued or stopped this visit): Started Medication Directions Instruction Stopped   aspirin 81 mg tablet,delayed release take 1 tablet by oral route  every day     doxazosin 2 mg tablet take 1 tablet by oral route  every day     estradiol 1 mg tablet take 1 tablet by oral route   every day     furosemide 20 mg tablet take 1 tablet by oral route  every day    03/03/2017 GABAPENTIN 300 MG CAPSULE TAKE 1 CAPSULE BY ORAL ROUTE 3 TIMES EVERY DAY     Klor-Con M10 mEq tablet,extended release take 2 tablet by oral route  every day with food     losartan 100 mg tablet take 1 tablet by oral route  every day     magnesium 500 mg ORAL      multivitamin tablet take 1 tablet by oral route  every day     niacin ER 1,000 mg tablet,extended release take 1 tablet by oral route 2 times every day at bedtime     pantoprazole 40 mg tablet,delayed release take 1 tablet by oral route  every day    11/27/2016 Percocet 5 mg-325 mg tablet take 1 tablets by oral route  every 8 hours as needed for pain     Symbicort 160 mcg-4.5 mcg/actuation HFA aerosol inhaler inhale 2 puff by inhalation route 2 times every day in the morning and evening     trazodone 150 mg tablet take 1 tablet by oral route 2 times every day after meals     vitamin b12  ORAL      vitamin d  ORAL      Zyrtec 10 mg tablet take 1 tablet by oral route  every day       ALLERGIES: Ingredient Reaction Medication Name Comment  NO KNOWN ALLERGIES  No known allergies. Reviewed, no changes.    Vitals Date Temp F BP Pulse Ht In Wt Lb BMI BSA Pain Score  07/14/2017  110/70 96 62 181.8 33.25  2/10      IMPRESSION Patient presents with neck pain radiating to the left shoulder and arm. On confrontational testing, weakness in the left arm, diminished left upper extremity reflexes, and positive Spurling on the left. MRI shows a bone spur with stenosis at C4-5 on the left, stenosis on left C5-6, and bone spur with stenosis at C6-7. X-ray shows arthritis and spurring at C4-5, C5-6, and C6-7. No abnormal movement with flexion or extension. Schedule ACDF C4-5, C5-6, and C6-7.  Completed Orders (this encounter) Order Details Reason Side Interpretation Result Initial Treatment Date Region  Cervical Spine- AP/Lat/Flex/Ex       07/14/2017 All Levels   Assessment/Plan # Detail Type Description   1. Assessment Cervicalgia (M54.2).       2. Assessment Herniated nucleus pulposus, C4-5 (M50.221).       3. Assessment Herniated nucleus pulposus, C5-6 (M50.222).       4. Assessment Herniated nucleus pulposus, C6-7 (M50.223).       5. Assessment Radiculopathy, cervical region (M54.12).           Pain Management Plan Pain Scale: 2/10. Method: Numeric Pain Intensity Scale. Location: neck. Onset: 05/30/2016. Duration: varies. Quality: discomforting.  Schedule ACDF C4-5, C5-6, and C6-7 on 09/05/17. Nurse education given. Fit for cervical collar.  Orders: Diagnostic Procedures: Assessment Procedure  W46.659 Cervical Spine- AP/Lat/Flex/Ex             Provider:  Vertell Limber MD, Marchia Meiers 07/14/2017 3:47 PM  Dictation edited by: Lucita Lora    CC Providers: Crist Infante [facility] [address] [place],  [place]  [id number]-   Jesicca Dipierro MD  [address] [place], [place] [id number]              Electronically signed by Marchia Meiers. Vertell Limber MD on 07/15/2017 05:32 PM

## 2017-09-05 NOTE — Anesthesia Preprocedure Evaluation (Addendum)
Anesthesia Evaluation  Patient identified by MRN, date of birth, ID band Patient awake    Reviewed: Allergy & Precautions, H&P , NPO status , Patient's Chart, lab work & pertinent test results  Airway Mallampati: II  TM Distance: <3 FB Neck ROM: Limited    Dental no notable dental hx. (+) Teeth Intact, Dental Advisory Given   Pulmonary asthma , former smoker,    Pulmonary exam normal breath sounds clear to auscultation       Cardiovascular hypertension, Pt. on medications  Rhythm:Regular Rate:Normal     Neuro/Psych Anxiety Depression negative neurological ROS     GI/Hepatic Neg liver ROS, hiatal hernia, GERD  Medicated and Controlled,  Endo/Other  negative endocrine ROS  Renal/GU negative Renal ROS  negative genitourinary   Musculoskeletal  (+) Arthritis , Osteoarthritis,    Abdominal   Peds  Hematology negative hematology ROS (+) anemia ,   Anesthesia Other Findings   Reproductive/Obstetrics negative OB ROS                           Anesthesia Physical Anesthesia Plan  ASA: II  Anesthesia Plan: General   Post-op Pain Management:    Induction: Intravenous  PONV Risk Score and Plan: 4 or greater and Ondansetron, Dexamethasone and Midazolam  Airway Management Planned: Oral ETT  Additional Equipment:   Intra-op Plan:   Post-operative Plan: Extubation in OR  Informed Consent: I have reviewed the patients History and Physical, chart, labs and discussed the procedure including the risks, benefits and alternatives for the proposed anesthesia with the patient or authorized representative who has indicated his/her understanding and acceptance.   Dental advisory given  Plan Discussed with: CRNA, Anesthesiologist and Surgeon  Anesthesia Plan Comments:        Anesthesia Quick Evaluation

## 2017-09-05 NOTE — Op Note (Signed)
09/05/2017  10:09 AM  PATIENT:  Shelley Spencer  71 y.o. female  PRE-OPERATIVE DIAGNOSIS:  Cervical herniated disc with spondylosis, stenosis, cervicalgia, radiculopathy C 45, C 56, C 67 levels  POST-OPERATIVE DIAGNOSIS:  Cervical herniated disc with spondylosis, stenosis, cervicalgia, radiculopathy C 45, C 56, C 67 levels  PROCEDURE:  Procedure(s): Cervical four-five, Cervical five-six, Cervical six-seven Anterior cervical decompression/discectomy/fusion (N/A) with PEEK cages, autograft, plate  SURGEON:  Surgeon(s) and Role:    Erline Levine, MD - Primary    * Newman Pies, MD - Assisting  PHYSICIAN ASSISTANT:   ASSISTANTS: Poteat, RN   ANESTHESIA:   general  EBL:  50 mL   BLOOD ADMINISTERED:none  DRAINS: (#10) Jackson-Pratt drain(s) with closed bulb suction in the prevertebral space   LOCAL MEDICATIONS USED:  MARCAINE    and LIDOCAINE   SPECIMEN:  No Specimen  DISPOSITION OF SPECIMEN:  N/A  COUNTS:  YES  TOURNIQUET:  * No tourniquets in log *   DICTATION: Patient was brought to operating room and following the smooth and uncomplicated induction of general endotracheal anesthesia his head was placed on a horseshoe head holder he was placed in 5 pounds of Holter traction and his anterior neck was prepped and draped in usual sterile fashion. An incision was made on the left side of midline after infiltrating the skin and subcutaneous tissues with local lidocaine. The platysmal layer was incised and subplatysmal dissection was performed exposing the anterior border sternocleidomastoid muscle. Using blunt dissection the carotid sheath was kept lateral and trachea and esophagus kept medial exposing the anterior cervical spine. A bent spinal needle was placed it was felt to be the C 34 level and this was confirmed on intraoperative x-ray. Longus coli muscles were taken down from the anterior cervical spine using electrocautery and key elevator and self-retaining retractor was  placed. The interspace at C6-7 was incised and a thorough discectomy was performed. Distraction pins were placed. Uncinate spurs and central spondylitic ridges were drilled down with a high-speed drill. The spinal cord dura and both C7 nerve roots were widely decompressed. Hemostasis was assured. After trial sizing a 6 mm peek interbody cage was selected and packed with local autograft. The graft was tamped into position and countersunk appropriately. The retractor was moved and the interspace at C5-6 was incised and a thorough discectomy was performed. Distraction pins were placed. Uncinate spurs and central spondylitic ridges were drilled down with a high-speed drill. The spinal cord dura and both C6 nerve roots were widely decompressed. Hemostasis was assured. After trial sizing a 5 mm peek interbody cage was selected and packed with local autograft. The graft was tamped into position and countersunk appropriately.The interspace at C4-5 was incised and a thorough discectomy was performed. Distraction pins were placed. Uncinate spurs and central spondylitic ridges were drilled down with a high-speed drill. The spinal cord dura and both C5 nerve roots were widely decompressed.  Hemostasis was assured. After trial sizing a 6 mm peek interbody cage was selected and packed with local autograft. The graft was tamped into position and countersunk appropriately.  Distraction weight was removed. A 45 mm Aviator anterior cervical plate was affixed to the cervical spine with 12 mm variable-angle screws 2 at C4, 2 at C5, 2 at C6,  and 2 at C7. All screws were well-positioned and locking mechanisms were engaged. Soft tissues were inspected and found to be in good repair. The wound was irrigated. A final x-ray was obtained with good visualization at  C4 with the interbody graft well visualized. A #10 JP drain was inserted through a separate stab incision.  The platysma layer was closed with 3-0 Vicryl stitches and the skin was  reapproximated with 3-0 Vicryl subcuticular stitches. The wound was dressed with Dermabond. Counts were correct at the end of the case. Patient was extubated and taken to recovery in stable and satisfactory condition.    PLAN OF CARE: Admit to inpatient   PATIENT DISPOSITION:  PACU - hemodynamically stable.   Delay start of Pharmacological VTE agent (>24hrs) due to surgical blood loss or risk of bleeding: yes

## 2017-09-05 NOTE — Evaluation (Signed)
Physical Therapy Evaluation Patient Details Name: Shelley Spencer MRN: [id number] DOB: 1946/04/13 Today's Date: 09/05/2017   History of Present Illness  Pt is a 71 y/o female who presents s/p C4-C7 ACDF on 09/04/17.  Clinical Impression  Pt admitted with above diagnosis. Pt currently with functional limitations due to the deficits listed below (see PT Problem List). At the time of PT eval pt was able to perform transfers and ambulation with gross supervision for safety. Pt was educated on precautions, brace application/wearing schedule, activity progression, and general safety with mobility at home. Pt will benefit from skilled PT to increase their independence and safety with mobility to allow discharge to the venue listed below.       Follow Up Recommendations No PT follow up;Supervision for mobility/OOB    Equipment Recommendations  3in1 (PT)    Recommendations for Other Services       Precautions / Restrictions Precautions Precautions: Fall;Cervical Precaution Comments: Reviewed handout - pt was cued for precautions during functional mobility.  Restrictions Weight Bearing Restrictions: No      Mobility  Bed Mobility Overal bed mobility: Needs Assistance Bed Mobility: Rolling;Sidelying to Sit;Sit to Sidelying Rolling: Modified independent (Device/Increase time) Sidelying to sit: Supervision     Sit to sidelying: Supervision General bed mobility comments: VC's for log roll technique. HOB slightly elevated and use of rails required.   Transfers Overall transfer level: Needs assistance Equipment used: None Transfers: Sit to/from Stand Sit to Stand: Supervision         General transfer comment: Supervision for safety. No unsteadiness or LOB noted.   Ambulation/Gait Ambulation/Gait assistance: Modified independent (Device/Increase time) Ambulation Distance (Feet): 400 Feet Assistive device: None Gait Pattern/deviations: Step-through pattern;Decreased stride  length Gait velocity: Decreased Gait velocity interpretation: Below normal speed for age/gender General Gait Details: Decreased trunk rotation and arm swing noted. Appeared guarded but no LOB noted.   Stairs            Wheelchair Mobility    Modified Rankin (Stroke Patients Only)       Balance Overall balance assessment: Needs assistance Sitting-balance support: Feet supported;No upper extremity supported Sitting balance-Leahy Scale: Good     Standing balance support: No upper extremity supported;During functional activity Standing balance-Leahy Scale: Fair                               Pertinent Vitals/Pain Pain Assessment: Faces Faces Pain Scale: Hurts little more Pain Location: incision site Pain Descriptors / Indicators: Operative site guarding;Aching Pain Intervention(s): Limited activity within patient's tolerance;Monitored during session;Repositioned    Home Living Family/patient expects to be discharged to:: Private residence Living Arrangements: Spouse/significant other;Children Available Help at Discharge: Family;Available 24 hours/day Type of Home: House Home Access: Level entry     Home Layout: One level Home Equipment: Shower seat - built in      Prior Function Level of Independence: Independent               Hand Dominance        Extremity/Trunk Assessment   Upper Extremity Assessment Upper Extremity Assessment: Defer to OT evaluation    Lower Extremity Assessment Lower Extremity Assessment: Overall WFL for tasks assessed    Cervical / Trunk Assessment Cervical / Trunk Assessment: Other exceptions Cervical / Trunk Exceptions: s/p surgery  Communication   Communication: No difficulties  Cognition Arousal/Alertness: Awake/alert Behavior During Therapy: WFL for tasks assessed/performed Overall Cognitive Status: Within Functional Limits  for tasks assessed                                         General Comments      Exercises     Assessment/Plan    PT Assessment Patient needs continued PT services  PT Problem List Decreased strength;Decreased range of motion;Decreased activity tolerance;Decreased balance;Decreased mobility;Decreased knowledge of use of DME;Decreased safety awareness;Decreased knowledge of precautions;Pain       PT Treatment Interventions DME instruction;Gait training;Stair training;Functional mobility training;Therapeutic activities;Therapeutic exercise;Neuromuscular re-education;Patient/family education    PT Goals (Current goals can be found in the Care Plan section)  Acute Rehab PT Goals Patient Stated Goal: Home tomorrow PT Goal Formulation: With patient/family Time For Goal Achievement: 09/19/17 Potential to Achieve Goals: Good    Frequency Min 5X/week   Barriers to discharge        Co-evaluation               AM-PAC PT "6 Clicks" Daily Activity  Outcome Measure Difficulty turning over in bed (including adjusting bedclothes, sheets and blankets)?: None Difficulty moving from lying on back to sitting on the side of the bed? : A Little Difficulty sitting down on and standing up from a chair with arms (e.g., wheelchair, bedside commode, etc,.)?: A Little Help needed moving to and from a bed to chair (including a wheelchair)?: A Little Help needed walking in hospital room?: A Little Help needed climbing 3-5 steps with a railing? : A Little 6 Click Score: 19    End of Session Equipment Utilized During Treatment: Gait belt;Cervical collar Activity Tolerance: Patient tolerated treatment well Patient left: in bed;with call bell/phone within reach;with family/visitor present;with SCD's reapplied Nurse Communication: Mobility status PT Visit Diagnosis: Unsteadiness on feet (R26.81);Pain;Other symptoms and signs involving the nervous system (R29.898) Pain - part of body: (neck)    Time: 6967-8938 PT Time Calculation (min) (ACUTE ONLY): 26  min   Charges:   PT Evaluation $PT Eval Moderate Complexity: 1 Mod PT Treatments $Gait Training: 8-22 mins   PT G Codes:        Rolinda Roan, PT, DPT Acute Rehabilitation Services Pager: [phone]   Thelma Comp 09/05/2017, 3:12 PM

## 2017-09-05 NOTE — Interval H&P Note (Signed)
History and Physical Interval Note:  09/05/2017 6:21 AM  Shelley Spencer  has presented today for surgery, with the diagnosis of Cervical herniated disc  The various methods of treatment have been discussed with the patient and family. After consideration of risks, benefits and other options for treatment, the patient has consented to  Procedure(s) with comments: C4-5 C5-6 C6-7 Anterior cervical decompression/discectomy/fusion (N/A) - C4-5 C5-6 C6-7 Anterior cervical decompression/discectomy/fusion as a surgical intervention .  The patient's history has been reviewed, patient examined, no change in status, stable for surgery.  I have reviewed the patient's chart and labs.  Questions were answered to the patient's satisfaction.     Quan Cybulski D

## 2017-09-05 NOTE — Transfer of Care (Signed)
Immediate Anesthesia Transfer of Care Note  Patient: Shelley Spencer  Procedure(s) Performed: Cervical four-five, Cervical five-six, Cervical six-seven Anterior cervical decompression/discectomy/fusion (N/A Neck)  Patient Location: PACU  Anesthesia Type:General  Level of Consciousness: awake, oriented and patient cooperative  Airway & Oxygen Therapy: Patient Spontanous Breathing and Patient connected to nasal cannula oxygen  Post-op Assessment: Report given to RN, Post -op Vital signs reviewed and stable and Patient moving all extremities  Post vital signs: Reviewed and stable  Last Vitals:  Vitals:   09/05/17 0558  BP: (!) 160/72  Pulse: 71  Resp: 20  Temp: 36.6 C  SpO2: 94%    Last Pain:  Vitals:   09/05/17 0632  TempSrc:   PainSc: 2       Patients Stated Pain Goal: 2 (31/49/70 2637)  Complications: No apparent anesthesia complications

## 2017-09-05 NOTE — Progress Notes (Signed)
Awake, alert, conversant.  MAEW with full strength Bilateral D/B/T/HI. No numbness.  Minimal pain.  Doing well.

## 2017-09-05 NOTE — Brief Op Note (Signed)
09/05/2017  10:09 AM  PATIENT:  Shelley Spencer  71 y.o. female  PRE-OPERATIVE DIAGNOSIS:  Cervical herniated disc with spondylosis, stenosis, cervicalgia, radiculopathy C 45, C 56, C 67 levels  POST-OPERATIVE DIAGNOSIS:  Cervical herniated disc with spondylosis, stenosis, cervicalgia, radiculopathy C 45, C 56, C 67 levels  PROCEDURE:  Procedure(s): Cervical four-five, Cervical five-six, Cervical six-seven Anterior cervical decompression/discectomy/fusion (N/A) with PEEK cages, autograft, plate  SURGEON:  Surgeon(s) and Role:    Erline Levine, MD - Primary    * Newman Pies, MD - Assisting  PHYSICIAN ASSISTANT:   ASSISTANTS: Poteat, RN   ANESTHESIA:   general  EBL:  50 mL   BLOOD ADMINISTERED:none  DRAINS: (#10) Jackson-Pratt drain(s) with closed bulb suction in the prevertebral space   LOCAL MEDICATIONS USED:  MARCAINE    and LIDOCAINE   SPECIMEN:  No Specimen  DISPOSITION OF SPECIMEN:  N/A  COUNTS:  YES  TOURNIQUET:  * No tourniquets in log *   DICTATION: Patient was brought to operating room and following the smooth and uncomplicated induction of general endotracheal anesthesia his head was placed on a horseshoe head holder he was placed in 5 pounds of Holter traction and his anterior neck was prepped and draped in usual sterile fashion. An incision was made on the left side of midline after infiltrating the skin and subcutaneous tissues with local lidocaine. The platysmal layer was incised and subplatysmal dissection was performed exposing the anterior border sternocleidomastoid muscle. Using blunt dissection the carotid sheath was kept lateral and trachea and esophagus kept medial exposing the anterior cervical spine. A bent spinal needle was placed it was felt to be the C 34 level and this was confirmed on intraoperative x-ray. Longus coli muscles were taken down from the anterior cervical spine using electrocautery and key elevator and self-retaining retractor was  placed. The interspace at C6-7 was incised and a thorough discectomy was performed. Distraction pins were placed. Uncinate spurs and central spondylitic ridges were drilled down with a high-speed drill. The spinal cord dura and both C7 nerve roots were widely decompressed. Hemostasis was assured. After trial sizing a 6 mm peek interbody cage was selected and packed with local autograft. The graft was tamped into position and countersunk appropriately. The retractor was moved and the interspace at C5-6 was incised and a thorough discectomy was performed. Distraction pins were placed. Uncinate spurs and central spondylitic ridges were drilled down with a high-speed drill. The spinal cord dura and both C6 nerve roots were widely decompressed. Hemostasis was assured. After trial sizing a 5 mm peek interbody cage was selected and packed with local autograft. The graft was tamped into position and countersunk appropriately.The interspace at C4-5 was incised and a thorough discectomy was performed. Distraction pins were placed. Uncinate spurs and central spondylitic ridges were drilled down with a high-speed drill. The spinal cord dura and both C5 nerve roots were widely decompressed.  Hemostasis was assured. After trial sizing a 6 mm peek interbody cage was selected and packed with local autograft. The graft was tamped into position and countersunk appropriately.  Distraction weight was removed. A 45 mm Aviator anterior cervical plate was affixed to the cervical spine with 12 mm variable-angle screws 2 at C4, 2 at C5, 2 at C6,  and 2 at C7. All screws were well-positioned and locking mechanisms were engaged. Soft tissues were inspected and found to be in good repair. The wound was irrigated. A final x-ray was obtained with good visualization at  C4 with the interbody graft well visualized. A #10 JP drain was inserted through a separate stab incision.  The platysma layer was closed with 3-0 Vicryl stitches and the skin was  reapproximated with 3-0 Vicryl subcuticular stitches. The wound was dressed with Dermabond. Counts were correct at the end of the case. Patient was extubated and taken to recovery in stable and satisfactory condition.    PLAN OF CARE: Admit to inpatient   PATIENT DISPOSITION:  PACU - hemodynamically stable.   Delay start of Pharmacological VTE agent (>24hrs) due to surgical blood loss or risk of bleeding: yes

## 2017-09-06 MED ORDER — DOCUSATE SODIUM 100 MG PO CAPS: 100.0000 mg | ORAL_CAPSULE | Freq: Two times a day (BID) | ORAL | 0 refills | Status: DC

## 2017-09-06 MED ORDER — METHOCARBAMOL 500 MG PO TABS: 500.0000 mg | ORAL_TABLET | Freq: Four times a day (QID) | ORAL | 0 refills | Status: DC | PRN

## 2017-09-06 MED ORDER — OXYCODONE HCL 5 MG PO TABS: 5.0000 mg | ORAL_TABLET | ORAL | 0 refills | Status: DC | PRN

## 2017-09-06 NOTE — Discharge Summary (Signed)
Physician Discharge Summary  Patient ID: Shelley Spencer MRN: [id number] DOB/AGE: 11-12-1945 71 y.o.  Admit date: 09/05/2017 Discharge date: 09/06/2017  Admission Diagnoses: C4-5, C5-6 and C6-7 herniated disc  Discharge Diagnoses: The same Active Problems:   Herniated cervical disc without myelopathy   Discharged Condition: good  Hospital Course: Dr. Vertell Limber performed a C4-5, C5-6 and C6-7 anterior cervical discectomy, fusion and plating on the patient on 09/05/2017.  The patient's postoperative course was unremarkable.  On postoperative day #1 she requested discharge to home.  She was given written and oral discharge instructions.  All her questions were answered.  Consults: None Significant Diagnostic Studies: None Treatments: C4-5, C5-6 and C6-7 anterior cervical discectomy, fusion, and plating Discharge Exam: Blood pressure (!) 156/79, pulse 70, temperature 98 F (36.7 C), temperature source Oral, resp. rate 18, height 5\' 2"  (1.575 m), weight 82.1 kg (181 lb), SpO2 99 %. The patient is alert and pleasant.  Her dressing is clean and dry.  There is no hematoma or shift.  She is moving all 4 extremities well.  Disposition: Home  Discharge Instructions    Call MD for:  difficulty breathing, headache or visual disturbances   Complete by:  As directed    Call MD for:  extreme fatigue   Complete by:  As directed    Call MD for:  hives   Complete by:  As directed    Call MD for:  persistant dizziness or light-headedness   Complete by:  As directed    Call MD for:  persistant nausea and vomiting   Complete by:  As directed    Call MD for:  redness, tenderness, or signs of infection (pain, swelling, redness, odor or green/yellow discharge around incision site)   Complete by:  As directed    Call MD for:  severe uncontrolled pain   Complete by:  As directed    Call MD for:  temperature >100.4   Complete by:  As directed    Diet - low sodium heart healthy   Complete by:  As  directed    Discharge instructions   Complete by:  As directed    Call [phone] for a followup appointment. Take a stool softener while you are using pain medications.   Driving Restrictions   Complete by:  As directed    Do not drive for 2 weeks.   Increase activity slowly   Complete by:  As directed    Lifting restrictions   Complete by:  As directed    Do not lift more than 5 pounds. No excessive bending or twisting.   May shower / Bathe   Complete by:  As directed    He may shower after the pain she is removed 3 days after surgery. Leave the incision alone.   Remove dressing in 48 hours   Complete by:  As directed    Your stitches are under the scan and will dissolve by themselves. The Steri-Strips will fall off after you take a few showers. Do not rub back or pick at the wound, Leave the wound alone.     Allergies as of 09/06/2017      Reactions   Amlodipine Other (See Comments)   Legs swell some and she doesn't like it.   Fenofibrate Other (See Comments)   Leg pain   Omeprazole Other (See Comments)   Unknown   Simvastatin Other (See Comments)   Myalgia, leg pains   Singulair [montelukast Sodium] Other (See Comments)  Affected mood   Statins Other (See Comments)   Myalgia, leg pains      Medication List    STOP taking these medications   acetaminophen 325 MG tablet Commonly known as:  TYLENOL     TAKE these medications   albuterol 108 (90 Base) MCG/ACT inhaler Commonly known as:  PROVENTIL HFA;VENTOLIN HFA Inhale 2 puffs into the lungs every 6 (six) hours as needed for shortness of breath.   aspirin EC 81 MG tablet Take 81 mg by mouth at bedtime.   budesonide-formoterol 160-4.5 MCG/ACT inhaler Commonly known as:  SYMBICORT Inhale 2 puffs into the lungs 2 (two) times daily as needed (for shortness of breath or wheezing).   cetirizine 10 MG tablet Commonly known as:  ZYRTEC Take 10 mg by mouth daily.   docusate sodium 100 MG capsule Commonly known  as:  COLACE Take 1 capsule (100 mg total) by mouth 2 (two) times daily.   doxazosin 4 MG tablet Commonly known as:  CARDURA Take 4 mg by mouth daily.   estradiol 0.05 MG/24HR patch Commonly known as:  MINIVELLE Place 1 patch (0.05 mg total) onto the skin 2 (two) times a week.   fluticasone 50 MCG/ACT nasal spray Commonly known as:  FLONASE Place 2 sprays into both nostrils daily as needed for allergies.   furosemide 20 MG tablet Commonly known as:  LASIX Take 20 mg by mouth daily.   losartan 100 MG tablet Commonly known as:  COZAAR Take 100 mg by mouth daily.   methocarbamol 500 MG tablet Commonly known as:  ROBAXIN Take 1 tablet (500 mg total) by mouth every 6 (six) hours as needed for muscle spasms.   MULTIVITAMIN PO Take 1 tablet by mouth daily.   niacin 1000 MG CR tablet Commonly known as:  NIASPAN Take 1,000 mg by mouth at bedtime.   OVER THE COUNTER MEDICATION Take 1 tablet by mouth daily. Magnesium + Vitamin D   oxyCODONE 5 MG immediate release tablet Commonly known as:  Oxy IR/ROXICODONE Take 1 tablet (5 mg total) by mouth every 4 (four) hours as needed for moderate pain ((score 4 to 6)).   pantoprazole 40 MG tablet Commonly known as:  PROTONIX Take 1 tablet (40 mg total) by mouth 2 (two) times daily. What changed:  when to take this   PARoxetine 40 MG tablet Commonly known as:  PAXIL Take 40 mg by mouth every morning.   potassium chloride 10 MEQ tablet Commonly known as:  K-DUR,KLOR-CON Take 20 mEq by mouth daily.   ranitidine 300 MG tablet Commonly known as:  ZANTAC Take 1 tablet (300 mg total) by mouth at bedtime.   traZODone 150 MG tablet Commonly known as:  DESYREL Take 150 mg by mouth at bedtime.   vitamin B-12 1000 MCG tablet Commonly known as:  CYANOCOBALAMIN Take 1,000 mcg by mouth daily.        Signed: Ophelia Charter 09/06/2017, 6:29 AM

## 2017-09-06 NOTE — Progress Notes (Signed)
Physical Therapy Treatment Patient Details Name: Shelley Spencer MRN: [id number] DOB: 10-26-1945 Today's Date: 09/06/2017    History of Present Illness Pt is a 71 y/o female who presents s/p C4-C7 ACDF on 09/04/17.    PT Comments    Patient independent with all activity today. No further acute PT needs. Feel patient will be safe for d/c home.   Follow Up Recommendations  No PT follow up;Supervision for mobility/OOB     Equipment Recommendations  3in1 (PT)    Recommendations for Other Services       Precautions / Restrictions Precautions Precautions: Fall;Cervical Precaution Comments: Reviewed handout. Pt with good adherence to precautions.  Required Braces or Orthoses: Cervical Brace Cervical Brace: Hard collar(off to shower) Restrictions Weight Bearing Restrictions: No    Mobility  Bed Mobility Overal bed mobility: Modified Independent             General bed mobility comments: No physical assistance required. Good technique.   Transfers Overall transfer level: Needs assistance Equipment used: None Transfers: Sit to/from Stand Sit to Stand: Supervision         General transfer comment: Supervision for safety.   Ambulation/Gait Ambulation/Gait assistance: Independent Ambulation Distance (Feet): 410 Feet Assistive device: None Gait Pattern/deviations: Step-through pattern;Decreased stride length Gait velocity: decreased Gait velocity interpretation: Below normal speed for age/gender General Gait Details: [facility]   Stairs            Wheelchair Mobility    Modified Rankin (Stroke Patients Only)       Balance Overall balance assessment: Needs assistance Sitting-balance support: Feet supported;No upper extremity supported Sitting balance-Leahy Scale: Good     Standing balance support: No upper extremity supported;During functional activity Standing balance-Leahy Scale: Fair Standing balance comment: Supervision for safety                             Cognition Arousal/Alertness: Awake/alert Behavior During Therapy: WFL for tasks assessed/performed Overall Cognitive Status: Within Functional Limits for tasks assessed                                        Exercises      General Comments General comments (skin integrity, edema, etc.): All education completed and pt demonstrates understanding.       Pertinent Vitals/Pain Pain Assessment: Faces Faces Pain Scale: Hurts a little bit Pain Location: incision site Pain Descriptors / Indicators: Operative site guarding;Aching Pain Intervention(s): Monitored during session    Home Living Family/patient expects to be discharged to:: Private residence Living Arrangements: Spouse/significant other;Children Available Help at Discharge: Family;Available 24 hours/day Type of Home: House Home Access: Level entry   Home Layout: One level Home Equipment: Shower seat - built in;Adaptive equipment Additional Comments: Has adapted mirrors in car as well    Prior Function Level of Independence: Independent          PT Goals (current goals can now be found in the care plan section) Acute Rehab PT Goals Patient Stated Goal: Home today PT Goal Formulation: With patient/family Time For Goal Achievement: 09/19/17 Potential to Achieve Goals: Good Progress towards PT goals: Goals met/education completed, patient discharged from PT    Frequency    Min 5X/week      PT Plan Current plan remains appropriate    Co-evaluation  AM-PAC PT "6 Clicks" Daily Activity  Outcome Measure                   End of Session Equipment Utilized During Treatment: Gait belt;Cervical collar Activity Tolerance: Patient tolerated treatment well Patient left: in bed;with call bell/phone within reach;with family/visitor present;with SCD's reapplied Nurse Communication: Mobility status PT Visit Diagnosis: Unsteadiness on feet (R26.81);Pain;Other  symptoms and signs involving the nervous system (R29.898) Pain - part of body: (neck)     Time: 1188-6773 PT Time Calculation (min) (ACUTE ONLY): 16 min  Charges:  $Gait Training: 8-22 mins                    G Codes:       Alben Deeds, PT DPT  Board Certified Neurologic Specialist [place] 09/06/2017, 10:36 AM

## 2017-09-06 NOTE — Progress Notes (Signed)
Patient alert and oriented, mae's well, voiding adequate amount of urine, swallowing without difficulty, no c/o pain at time of discharge. Patient discharged home with family. Script and discharged instructions given to patient. Patient and family stated understanding of instructions given. Patient has an appointment with Dr.Stern    

## 2017-09-06 NOTE — Evaluation (Signed)
Occupational Therapy Evaluation/Discharge Patient Details Name: Shelley Spencer MRN: [id number] DOB: 04-03-1946 Today's Date: 09/06/2017    History of Present Illness Pt is a 71 y/o female who presents s/p C4-C7 ACDF on 09/04/17.   Clinical Impression   PTA, pt was independent with ADL and functional mobility. Pt currently requires overall supervision with intermittent VC's for adherence to cervical precautions during BADL tasks. Educated pt concerning compensatory ADL strategies to maximize independence and adherence to precautions during daily routine. Pt demonstrates good understanding of precautions and strategies. Additionally educated pt concerning brace wear schedule, home set-up for safety, and do not scrub over incision. All education complete and pt verbalizes/demonstrates understanding. No further acute OT needs identified. OT will sign off.     Follow Up Recommendations  No OT follow up;Supervision/Assistance - 24 hour(initial)    Equipment Recommendations  None recommended by OT    Recommendations for Other Services       Precautions / Restrictions Precautions Precautions: Fall;Cervical Precaution Comments: Reviewed handout. Pt with good adherence to precautions.  Required Braces or Orthoses: Cervical Brace Cervical Brace: Hard collar(off to shower) Restrictions Weight Bearing Restrictions: No      Mobility Bed Mobility Overal bed mobility: Modified Independent             General bed mobility comments: No physical assistance required. Good technique.   Transfers Overall transfer level: Needs assistance Equipment used: None Transfers: Sit to/from Stand Sit to Stand: Supervision         General transfer comment: Supervision for safety.     Balance Overall balance assessment: Needs assistance Sitting-balance support: Feet supported;No upper extremity supported Sitting balance-Leahy Scale: Good     Standing balance support: No upper extremity  supported;During functional activity Standing balance-Leahy Scale: Fair Standing balance comment: Supervision for safety                           ADL either performed or assessed with clinical judgement   ADL Overall ADL's : Needs assistance/impaired                                       General ADL Comments: Supervision for safety and VC's for precautions and compensatory strategies at times. Pt educated concerning dressing, bathing, and grooming compensatory strategies to maximize adherence to cervical precautions.      Vision Patient Visual Report: No change from baseline Vision Assessment?: No apparent visual deficits     Perception     Praxis      Pertinent Vitals/Pain Pain Assessment: Faces Faces Pain Scale: Hurts a little bit Pain Location: incision site Pain Descriptors / Indicators: Operative site guarding;Aching Pain Intervention(s): Limited activity within patient's tolerance;Monitored during session;Repositioned     Hand Dominance     Extremity/Trunk Assessment Upper Extremity Assessment Upper Extremity Assessment: Overall WFL for tasks assessed   Lower Extremity Assessment Lower Extremity Assessment: Overall WFL for tasks assessed   Cervical / Trunk Assessment Cervical / Trunk Assessment: Other exceptions Cervical / Trunk Exceptions: s/p cervical surgery   Communication Communication Communication: No difficulties   Cognition Arousal/Alertness: Awake/alert Behavior During Therapy: WFL for tasks assessed/performed Overall Cognitive Status: Within Functional Limits for tasks assessed  General Comments  All education completed and pt demonstrates understanding.     Exercises     Shoulder Instructions      Home Living Family/patient expects to be discharged to:: Private residence Living Arrangements: Spouse/significant other;Children Available Help at Discharge:  Family;Available 24 hours/day Type of Home: House Home Access: Level entry     Home Layout: One level     Bathroom Shower/Tub: Occupational psychologist: Standard Bathroom Accessibility: Yes   Home Equipment: Shower seat - built Hotel manager: Long-handled Event organiser Comments: Has adapted mirrors in car as well      Prior Functioning/Environment Level of Independence: Independent                 OT Problem List: Decreased activity tolerance;Impaired balance (sitting and/or standing);Decreased knowledge of use of DME or AE;Decreased safety awareness;Decreased knowledge of precautions;Pain      OT Treatment/Interventions:      OT Goals(Current goals can be found in the care plan section) Acute Rehab OT Goals Patient Stated Goal: Home today OT Goal Formulation: With patient Potential to Achieve Goals: Good  OT Frequency:     Barriers to D/C:            Co-evaluation              AM-PAC PT "6 Clicks" Daily Activity     Outcome Measure Help from another person eating meals?: A Little Help from another person taking care of personal grooming?: A Little Help from another person toileting, which includes using toliet, bedpan, or urinal?: A Little Help from another person bathing (including washing, rinsing, drying)?: A Little Help from another person to put on and taking off regular upper body clothing?: A Little Help from another person to put on and taking off regular lower body clothing?: A Little 6 Click Score: 18   End of Session Equipment Utilized During Treatment: Cervical collar Nurse Communication: Mobility status  Activity Tolerance: Patient tolerated treatment well Patient left: in bed;with call bell/phone within reach;Other (comment)(at EOB for breakfast)  OT Visit Diagnosis: Other abnormalities of gait and mobility (R26.89)                Time: 6767-2094 OT Time Calculation (min): 18  min Charges:  OT General Charges $OT Visit: 1 Visit OT Evaluation $OT Eval Moderate Complexity: 1 Mod G-Codes:     Norman Herrlich, MS OTR/L  Pager: [place] A Arlander Gillen 09/06/2017, 9:14 AM

## 2017-09-08 ENCOUNTER — Encounter (HOSPITAL_COMMUNITY): Payer: Self-pay | Admitting: Neurosurgery

## 2018-03-11 ENCOUNTER — Other Ambulatory Visit: Payer: Self-pay

## 2018-03-11 ENCOUNTER — Ambulatory Visit (INDEPENDENT_AMBULATORY_CARE_PROVIDER_SITE_OTHER): Payer: Medicare Other | Admitting: Obstetrics and Gynecology

## 2018-03-11 ENCOUNTER — Encounter: Payer: Self-pay | Admitting: Obstetrics and Gynecology

## 2018-03-11 VITALS — BP 118/70 | HR 76 | Resp 16 | Ht 61.75 in | Wt 185.0 lb

## 2018-03-11 DIAGNOSIS — Z01419 Encounter for gynecological examination (general) (routine) without abnormal findings: Secondary | ICD-10-CM

## 2018-03-11 NOTE — Progress Notes (Signed)
72 y.o. G75P0012 Married Caucasian female here for annual exam.    Had cervical spine surgery last summer.   On transdermal estrogen and thinking about stopping.  Forgets to change it and does not notice the difference.   PCP: Dr. Joylene Draft      No LMP recorded (lmp unknown). Patient has had a hysterectomy.           Sexually active: Yes.   - rarely per patient The current method of family planning is status post hysterectomy.    Exercising: No.  The patient does not participate in regular exercise at present. Smoker:  no  Health Maintenance: Pap:  11/09 neg History of abnormal Pap:  no MMG:  05/27/17 - [facility] - Bi-rads 2, density B. Colonoscopy:  2018.  Tubular adenoma.  [place].  BMD:   2015  Result  Normal with Dr Joylene Draft TDaP:  5/18 HIV: never Hep C: never Screening Labs:  PCP   reports that she quit smoking about 38 years ago. Her smoking use included cigarettes. She has a 20.00 pack-year smoking history. She has never used smokeless tobacco. She reports that she drinks about 0.6 oz of alcohol per week. She reports that she does not use drugs.  Past Medical History:  Diagnosis Date  . Anemia   . Anxiety   . Arthritis   . Asthma   . Bicornuate uterus    double cervix  . Colon polyp   . Depression   . Deviated septum   . DJD (degenerative joint disease)    L-Spine  . Fibroids   . GERD (gastroesophageal reflux disease)   . Hematuria   . Hiatal hernia   . HNP (herniated nucleus pulposus)    lower back  . Hyperlipidemia   . Hypertension   . MVP (mitral valve prolapse)    pt denies, pt states she had a Echo ?2008 and it did not show MVP  . Pre-diabetes   . Retinal tear 2005   Small right retinal tear  . Shingles 1980's   on her waist    Past Surgical History:  Procedure Laterality Date  . ANTERIOR CERVICAL DECOMP/DISCECTOMY FUSION N/A 09/05/2017   Procedure: Cervical four-five, Cervical five-six, Cervical six-seven Anterior cervical  decompression/discectomy/fusion;  Surgeon: Erline Levine, MD;  Location: [facility];  Service: Neurosurgery;  Laterality: N/A;  . bone spur surgery Bilateral    x 3  . CARDIOVASCULAR STRESS TEST  02/08/2014  . COLONOSCOPY    . FINGER SURGERY Left    left third finger mucoid cyst removed  . FOOT NEUROMA SURGERY Bilateral   . HYSTEROSCOPY     D&C PMB Endo Cx Polyp  . KNEE ARTHROSCOPY WITH MENISCAL REPAIR Right 2016   --Flossie Dibble, MD  . LAPAROSCOPIC HYSTERECTOMY  7/209/10   R-TLH/BSO  Fibroids, BAck Pain, Adenomyosis  . LUMBAR FUSION    . removal vaginal septum    . TUBAL LIGATION      Current Outpatient Medications  Medication Sig Dispense Refill  . albuterol (PROVENTIL HFA;VENTOLIN HFA) 108 (90 BASE) MCG/ACT inhaler Inhale 2 puffs into the lungs every 6 (six) hours as needed for shortness of breath.     . budesonide-formoterol (SYMBICORT) 160-4.5 MCG/ACT inhaler Inhale 2 puffs into the lungs 2 (two) times daily as needed (for shortness of breath or wheezing).     . cetirizine (ZYRTEC) 10 MG tablet Take 10 mg by mouth daily.    Marland Kitchen docusate sodium (COLACE) 100 MG capsule Take 1 capsule (100 mg  total) by mouth 2 (two) times daily. 60 capsule 0  . doxazosin (CARDURA) 4 MG tablet Take 4 mg by mouth daily.     . fluticasone (FLONASE) 50 MCG/ACT nasal spray Place 2 sprays into both nostrils daily as needed for allergies.     . furosemide (LASIX) 20 MG tablet Take 20 mg by mouth daily.    Marland Kitchen losartan (COZAAR) 100 MG tablet Take 100 mg by mouth daily.    . Multiple Vitamins-Minerals (MULTIVITAMIN PO) Take 1 tablet by mouth daily.    . niacin (NIASPAN) 1000 MG CR tablet Take 1,000 mg by mouth at bedtime.    Marland Kitchen oxyCODONE (OXY IR/ROXICODONE) 5 MG immediate release tablet Take 1 tablet (5 mg total) by mouth every 4 (four) hours as needed for moderate pain ((score 4 to 6)). 30 tablet 0  . pantoprazole (PROTONIX) 40 MG tablet Take 1 tablet (40 mg total) by mouth 2 (two) times daily. (Patient taking  differently: Take 40 mg by mouth daily. ) 180 tablet 3  . PARoxetine (PAXIL) 40 MG tablet Take 40 mg by mouth every morning.    . potassium chloride (K-DUR,KLOR-CON) 10 MEQ tablet Take 20 mEq by mouth daily.    . ranitidine (ZANTAC) 300 MG tablet Take 1 tablet (300 mg total) by mouth at bedtime. 90 tablet 3  . traZODone (DESYREL) 150 MG tablet Take 150 mg by mouth at bedtime.    . vitamin B-12 (CYANOCOBALAMIN) 1000 MCG tablet Take 1,000 mcg by mouth daily.    Marland Kitchen OVER THE COUNTER MEDICATION Take 1 tablet by mouth daily. Magnesium + Vitamin D     No current facility-administered medications for this visit.     Family History  Problem Relation Age of Onset  . Heart failure Father        Possibly related to EtOH  . Alcohol abuse Father   . Emphysema Father   . Emphysema Mother   . Heart failure Mother        Possibly related to EtOH  . Diabetes Mother   . Alcohol abuse Mother   . Valvular heart disease Brother   . Bipolar disorder Brother   . Crohn's disease Daughter   . Thyroid disease Daughter   . Colon cancer Neg Hx   . Stomach cancer Neg Hx   . Esophageal cancer Neg Hx   . Rectal cancer Neg Hx   . Liver cancer Neg Hx     Review of Systems  Constitutional: Negative.   HENT: Negative.   Eyes: Negative.   Respiratory: Negative.   Cardiovascular: Negative.   Gastrointestinal: Negative.   Endocrine: Negative.   Genitourinary: Negative.   Musculoskeletal: Negative.   Skin: Negative.   Allergic/Immunologic: Negative.   Neurological: Negative.   Hematological: Negative.   Psychiatric/Behavioral: Negative.     Exam:   BP 118/70 (BP Location: Right Arm, Patient Position: Sitting, Cuff Size: Normal)   Pulse 76   Resp 16   Ht 5' 1.75" (1.568 m)   Wt 185 lb (83.9 kg)   LMP  (LMP Unknown)   BMI 34.11 kg/m     General appearance: alert, cooperative and appears stated age Head: Normocephalic, without obvious abnormality, atraumatic Neck: no adenopathy, supple, symmetrical,  trachea midline and thyroid normal to inspection and palpation Lungs: clear to auscultation bilaterally Breasts: normal appearance, no masses or tenderness, No nipple retraction or dimpling, No nipple discharge or bleeding, No axillary or supraclavicular adenopathy Heart: regular rate and rhythm Abdomen: soft, non-tender; no  masses, no organomegaly Extremities: extremities normal, atraumatic, no cyanosis or edema Skin: Skin color, texture, turgor normal. No rashes or lesions Lymph nodes: Cervical, supraclavicular, and axillary nodes normal. No abnormal inguinal nodes palpated Neurologic: Grossly normal  Pelvic: External genitalia:  no lesions              Urethra:  normal appearing urethra with no masses, tenderness or lesions              Bartholins and Skenes: normal                 Vagina: normal appearing vagina with normal color and discharge, no lesions              Cervix:  absent              Pap taken: No. Bimanual Exam:  Uterus:   absent              Adnexa: no mass, fullness, tenderness              Rectal exam: Yes.  .  Confirms.              Anus:  normal sphincter tone, no lesions  Chaperone was present for exam.  Assessment:   Well woman visit with normal exam. Status post robotic TLH/BSO.  On ERT.    Stress and anxiety.  On Paxil. HTN, hyperlipidemia, hx elevated glucose.  Plan: Mammogram screening. Recommended self breast awareness. Pap and HR HPV as above. Guidelines for Calcium, Vitamin D, regular exercise program including cardiovascular and weight bearing exercise. She will stop estrogen.  We discussed potential risks of DVT, PE, stroke, and possible breast cancer.  We talked about the benefits of Paxil to treat hot flashes. Labs with PCP.  Follow up annually and prn.   After visit summary provided.

## 2018-03-11 NOTE — Patient Instructions (Signed)

## 2018-03-21 ENCOUNTER — Other Ambulatory Visit: Payer: Self-pay | Admitting: Obstetrics and Gynecology

## 2018-04-28 ENCOUNTER — Encounter: Payer: Self-pay | Admitting: Obstetrics and Gynecology

## 2018-05-06 ENCOUNTER — Other Ambulatory Visit: Payer: Self-pay | Admitting: Gastroenterology

## 2018-09-18 ENCOUNTER — Other Ambulatory Visit: Payer: Self-pay | Admitting: Orthopedic Surgery

## 2018-09-21 ENCOUNTER — Other Ambulatory Visit: Payer: Self-pay

## 2018-09-21 ENCOUNTER — Encounter (HOSPITAL_BASED_OUTPATIENT_CLINIC_OR_DEPARTMENT_OTHER): Payer: Self-pay | Admitting: *Deleted

## 2018-09-25 ENCOUNTER — Encounter (HOSPITAL_BASED_OUTPATIENT_CLINIC_OR_DEPARTMENT_OTHER)
Admission: RE | Admit: 2018-09-25 | Discharge: 2018-09-25 | Disposition: A | Discharge status: Home / Self Care | Payer: Medicare Other | Source: Ambulatory Visit | Attending: Orthopedic Surgery | Admitting: Orthopedic Surgery

## 2018-09-25 ENCOUNTER — Other Ambulatory Visit: Payer: Self-pay

## 2018-09-25 DIAGNOSIS — Z01818 Encounter for other preprocedural examination: Secondary | ICD-10-CM | POA: Diagnosis present

## 2018-09-25 LAB — BASIC METABOLIC PANEL
Anion gap: 8 (ref 5–15)
BUN: 10 mg/dL (ref 8–23)
CO2: 22 mmol/L (ref 22–32)
Calcium: 9.4 mg/dL (ref 8.9–10.3)
Chloride: 109 mmol/L (ref 98–111)
Creatinine, Ser: 1.29 mg/dL — ABNORMAL HIGH (ref 0.44–1.00)
GFR calc Af Amer: 49 mL/min — ABNORMAL LOW (ref >60)
GFR calc non Af Amer: 42 mL/min — ABNORMAL LOW (ref >60)
Glucose, Bld: 117 mg/dL — ABNORMAL HIGH (ref 70–99)
Potassium: 4.4 mmol/L (ref 3.5–5.1)
Sodium: 139 mmol/L (ref 135–145)

## 2018-09-28 NOTE — Progress Notes (Addendum)
Reviewed BMP results and EKG with Dr. Gifford Shave.  Okay'd to proceed with surgery as scheduled.   Edyth Gunnels, RN

## 2018-09-29 ENCOUNTER — Ambulatory Visit (HOSPITAL_BASED_OUTPATIENT_CLINIC_OR_DEPARTMENT_OTHER): Payer: Medicare Other | Admitting: Anesthesiology

## 2018-09-29 ENCOUNTER — Other Ambulatory Visit: Payer: Self-pay

## 2018-09-29 ENCOUNTER — Ambulatory Visit (HOSPITAL_BASED_OUTPATIENT_CLINIC_OR_DEPARTMENT_OTHER)
Admission: RE | Admit: 2018-09-29 | Discharge: 2018-09-29 | Disposition: A | Discharge status: Home / Self Care | Payer: Medicare Other | Attending: Orthopedic Surgery | Admitting: Orthopedic Surgery

## 2018-09-29 ENCOUNTER — Encounter (HOSPITAL_BASED_OUTPATIENT_CLINIC_OR_DEPARTMENT_OTHER): Payer: Self-pay | Admitting: Certified Registered"

## 2018-09-29 ENCOUNTER — Encounter (HOSPITAL_BASED_OUTPATIENT_CLINIC_OR_DEPARTMENT_OTHER)
Admission: RE | Disposition: A | Discharge status: Home / Self Care | Payer: Self-pay | Source: Home / Self Care | Attending: Orthopedic Surgery

## 2018-09-29 DIAGNOSIS — Z87891 Personal history of nicotine dependence: Secondary | ICD-10-CM | POA: Insufficient documentation

## 2018-09-29 DIAGNOSIS — I10 Essential (primary) hypertension: Secondary | ICD-10-CM | POA: Insufficient documentation

## 2018-09-29 DIAGNOSIS — M19042 Primary osteoarthritis, left hand: Secondary | ICD-10-CM | POA: Insufficient documentation

## 2018-09-29 HISTORY — PX: CYST EXCISION: SHX5701

## 2018-09-29 SURGERY — CYST REMOVAL
Anesthesia: Monitor Anesthesia Care | Site: Hand | Laterality: Left

## 2018-09-29 MED ORDER — BUPIVACAINE HCL (PF) 0.25 % IJ SOLN
INTRAMUSCULAR | Status: DC | PRN
  Administered 2018-09-29: 8 mL

## 2018-09-29 MED ORDER — MIDAZOLAM HCL 2 MG/2ML IJ SOLN: 1.0000 mg | INTRAMUSCULAR | Status: DC | PRN

## 2018-09-29 MED ORDER — PROPOFOL 500 MG/50ML IV EMUL
INTRAVENOUS | Status: AC
  Filled 2018-09-29: qty 50

## 2018-09-29 MED ORDER — ONDANSETRON HCL 4 MG/2ML IJ SOLN
INTRAMUSCULAR | Status: DC | PRN
  Administered 2018-09-29: 4 mg via INTRAVENOUS

## 2018-09-29 MED ORDER — CEFAZOLIN SODIUM-DEXTROSE 2-4 GM/100ML-% IV SOLN
2.0000 g | INTRAVENOUS | Status: AC
  Administered 2018-09-29: 2 g via INTRAVENOUS

## 2018-09-29 MED ORDER — SCOPOLAMINE 1 MG/3DAYS TD PT72: 1.0000 | MEDICATED_PATCH | Freq: Once | TRANSDERMAL | Status: DC | PRN

## 2018-09-29 MED ORDER — TRAMADOL HCL 50 MG PO TABS: 50.0000 mg | ORAL_TABLET | Freq: Four times a day (QID) | ORAL | 0 refills | Status: DC | PRN

## 2018-09-29 MED ORDER — FENTANYL CITRATE (PF) 100 MCG/2ML IJ SOLN
50.0000 ug | INTRAMUSCULAR | Status: DC | PRN
  Administered 2018-09-29: 50 ug via INTRAVENOUS

## 2018-09-29 MED ORDER — LIDOCAINE HCL (CARDIAC) PF 100 MG/5ML IV SOSY
PREFILLED_SYRINGE | INTRAVENOUS | Status: DC | PRN
  Administered 2018-09-29: 30 mg via INTRAVENOUS

## 2018-09-29 MED ORDER — CEFAZOLIN SODIUM-DEXTROSE 2-4 GM/100ML-% IV SOLN
INTRAVENOUS | Status: AC
  Filled 2018-09-29: qty 100

## 2018-09-29 MED ORDER — FENTANYL CITRATE (PF) 100 MCG/2ML IJ SOLN
INTRAMUSCULAR | Status: AC
  Filled 2018-09-29: qty 2

## 2018-09-29 MED ORDER — PROPOFOL 500 MG/50ML IV EMUL
INTRAVENOUS | Status: DC | PRN
  Administered 2018-09-29: 75 ug/kg/min via INTRAVENOUS

## 2018-09-29 MED ORDER — CHLORHEXIDINE GLUCONATE 4 % EX LIQD: 60.0000 mL | Freq: Once | CUTANEOUS | Status: DC

## 2018-09-29 MED ORDER — LACTATED RINGERS IV SOLN
INTRAVENOUS | Status: DC
  Administered 2018-09-29: 09:00:00 via INTRAVENOUS

## 2018-09-29 SURGICAL SUPPLY — 52 items
BANDAGE COBAN STERILE 2 (GAUZE/BANDAGES/DRESSINGS) IMPLANT
BLADE MINI RND TIP GREEN BEAV (BLADE) IMPLANT
BLADE SURG 15 STRL LF DISP TIS (BLADE) ×1 IMPLANT
BLADE SURG 15 STRL SS (BLADE) ×2
BNDG CMPR 9X4 STRL LF SNTH (GAUZE/BANDAGES/DRESSINGS)
BNDG COHESIVE 1X5 TAN STRL LF (GAUZE/BANDAGES/DRESSINGS) ×1 IMPLANT
BNDG COHESIVE 3X5 TAN STRL LF (GAUZE/BANDAGES/DRESSINGS) IMPLANT
BNDG ESMARK 4X9 LF (GAUZE/BANDAGES/DRESSINGS) IMPLANT
BNDG GAUZE ELAST 4 BULKY (GAUZE/BANDAGES/DRESSINGS) IMPLANT
CHLORAPREP W/TINT 26ML (MISCELLANEOUS) ×2 IMPLANT
CORD BIPOLAR FORCEPS 12FT (ELECTRODE) ×2 IMPLANT
COVER BACK TABLE 60X90IN (DRAPES) ×2 IMPLANT
COVER MAYO STAND STRL (DRAPES) ×2 IMPLANT
COVER WAND RF STERILE (DRAPES) IMPLANT
CUFF TOURNIQUET SINGLE 18IN (TOURNIQUET CUFF) IMPLANT
DECANTER SPIKE VIAL GLASS SM (MISCELLANEOUS) IMPLANT
DRAIN PENROSE 1/2X12 LTX STRL (WOUND CARE) IMPLANT
DRAPE EXTREMITY T 121X128X90 (DISPOSABLE) ×2 IMPLANT
DRAPE SURG 17X23 STRL (DRAPES) ×2 IMPLANT
GAUZE SPONGE 4X4 12PLY STRL (GAUZE/BANDAGES/DRESSINGS) ×2 IMPLANT
GAUZE XEROFORM 1X8 LF (GAUZE/BANDAGES/DRESSINGS) ×2 IMPLANT
GLOVE BIOGEL PI IND STRL 7.0 (GLOVE) IMPLANT
GLOVE BIOGEL PI IND STRL 8 (GLOVE) IMPLANT
GLOVE BIOGEL PI IND STRL 8.5 (GLOVE) ×1 IMPLANT
GLOVE BIOGEL PI INDICATOR 7.0 (GLOVE) ×1
GLOVE BIOGEL PI INDICATOR 8 (GLOVE) ×1
GLOVE BIOGEL PI INDICATOR 8.5 (GLOVE) ×1
GLOVE SURG ORTHO 8.0 STRL STRW (GLOVE) ×2 IMPLANT
GLOVE SURG SYN 8.0 (GLOVE) ×2 IMPLANT
GLOVE SURG SYN 8.0 PF PI (GLOVE) IMPLANT
GOWN STRL REUS W/ TWL LRG LVL3 (GOWN DISPOSABLE) ×1 IMPLANT
GOWN STRL REUS W/TWL LRG LVL3 (GOWN DISPOSABLE) ×2
GOWN STRL REUS W/TWL XL LVL3 (GOWN DISPOSABLE) ×2 IMPLANT
NDL PRECISIONGLIDE 27X1.5 (NEEDLE) IMPLANT
NEEDLE PRECISIONGLIDE 27X1.5 (NEEDLE) ×2 IMPLANT
NS IRRIG 1000ML POUR BTL (IV SOLUTION) ×2 IMPLANT
PACK BASIN DAY SURGERY FS (CUSTOM PROCEDURE TRAY) ×2 IMPLANT
PAD CAST 3X4 CTTN HI CHSV (CAST SUPPLIES) IMPLANT
PADDING CAST ABS 3INX4YD NS (CAST SUPPLIES)
PADDING CAST ABS 4INX4YD NS (CAST SUPPLIES) ×1
PADDING CAST ABS COTTON 3X4 (CAST SUPPLIES) IMPLANT
PADDING CAST ABS COTTON 4X4 ST (CAST SUPPLIES) ×1 IMPLANT
PADDING CAST COTTON 3X4 STRL (CAST SUPPLIES)
SPLINT PLASTER CAST XFAST 3X15 (CAST SUPPLIES) IMPLANT
SPLINT PLASTER XTRA FASTSET 3X (CAST SUPPLIES)
STOCKINETTE 4X48 STRL (DRAPES) ×2 IMPLANT
SUT ETHILON 4 0 PS 2 18 (SUTURE) ×2 IMPLANT
SUT VIC AB 4-0 P2 18 (SUTURE) IMPLANT
SYR BULB 3OZ (MISCELLANEOUS) ×2 IMPLANT
SYR CONTROL 10ML LL (SYRINGE) ×1 IMPLANT
TOWEL GREEN STERILE FF (TOWEL DISPOSABLE) ×4 IMPLANT
UNDERPAD 30X30 (UNDERPADS AND DIAPERS) ×2 IMPLANT

## 2018-09-29 NOTE — Op Note (Signed)
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   ASSISTANT: none   ANESTHESIA:  Bier block with sedation and Local   INTRAVENOUS FLUIDS:  Per anesthesia flow sheet.   ESTIMATED BLOOD LOSS:  Minimal.   COMPLICATIONS:  None.   SPECIMENS:   Cyst synovial tissue osteophyte   TOURNIQUET TIME:    Total Tourniquet Time Documented: Forearm (Left) - 20 minutes Total: Forearm (Left) - 20 minutes    DISPOSITION:  Stable to PACU.   INDICATIONS: Patient is a 73 year old female with a history of a mass over the distal interphalangeal joint of her left middle finger.  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  She is is having this removed along with debridement of the joint.  Pre-peri-postoperative course been discussed along with risks and complications.  She is aware there is no guarantee to the surgery the possibility of infection recurrence injury to arteries nerves tendons complete relief symptoms and dystrophy.  Preoperative area the patient is seen the extremity marked by both patient and patient and surgeon antibiotic given OPERATIVE COURSE: Patient is brought to the operating room where a forearm-based IV regional anesthetic was carried out without difficulty under the direction of the anesthesia department.  She was prepped using ChloraPrep in the supine position with a left arm free.  A three-minute dry time was allowed timeout taken to  confirm patient procedure.  A curvilinear incision was made over the distal phalangeal joint left middle finger carried down through subcutaneous tissue.  Bleeders were electrocauterized with bipolar.  The remainder of the cyst was identified this was removed with a small rondure the joint was opened on its lateral aspect protecting the extensor tendon.  A very significant synovitis was present.  This was removed with a hemostatic rondure osteophytes were removed with the rondure and with a house curette.  The cement was sent to pathology.  The wound was copiously irrigated with saline.  Skin was closed interrupted 4-0 nylon sutures.  Sterile compressive dressing and splint to the finger was applied.  Beginning of the procedure a metacarpal block was given with quarter percent bupivacaine without epinephrine 8 cc was used.  She is discharged home to return [place] in 1 week on Tylenol ibuprofen with Ultram as a backup.   Daryll Brod, MD Electronically signed, 09/29/18

## 2018-09-29 NOTE — Discharge Instructions (Addendum)

## 2018-09-29 NOTE — Anesthesia Procedure Notes (Signed)
Procedure Name: MAC Date/Time: 09/29/2018 10:22 AM Performed by: Signe Colt, CRNA Pre-anesthesia Checklist: Patient identified, Emergency Drugs available, Suction available, Patient being monitored and Timeout performed Patient Re-evaluated:Patient Re-evaluated prior to induction Oxygen Delivery Method: Simple face mask

## 2018-09-29 NOTE — Brief Op Note (Signed)
09/29/2018  10:38 AM  PATIENT:  [name]  73 y.o. female  PRE-OPERATIVE DIAGNOSIS:  LEFT MIDDLE FINGER CYST, DEGENERATIVE JOINT DISEASE DISTAL INTERPHALANGEAL JOINT  POST-OPERATIVE DIAGNOSIS:  LEFT MIDDLE FINGER CYST, DEGENERATIVE JOINT   PROCEDURE:  Procedure(s): LEFT MIDDLE FINGER CYST REMOVAL AND DEBRIDEMENT OF DISTAL INTERPHALANGEAL JOINT (Left)  SURGEON:  Surgeon(s) and Role:    * Daryll Brod, MD - Primary  PHYSICIAN ASSISTANT:   ASSISTANTS: none   ANESTHESIA:   local, regional and IV sedation  EBL:  9ml  BLOOD ADMINISTERED:none  DRAINS: none   LOCAL MEDICATIONS USED:  BUPIVICAINE   SPECIMEN:  Excision  DISPOSITION OF SPECIMEN:  PATHOLOGY  COUNTS:  YES  TOURNIQUET:   Total Tourniquet Time Documented: Forearm (Left) - 20 minutes Total: Forearm (Left) - 20 minutes   DICTATION: .Dragon Dictation  PLAN OF CARE: Discharge to home after PACU  PATIENT DISPOSITION:  PACU - hemodynamically stable.

## 2018-09-29 NOTE — Anesthesia Preprocedure Evaluation (Addendum)
Anesthesia Evaluation  Patient identified by MRN, date of birth, ID band Patient awake    Reviewed: Allergy & Precautions, NPO status , Patient's Chart, lab work & pertinent test results  Airway Mallampati: I  TM Distance: >3 FB Neck ROM: Limited    Dental no notable dental hx. (+) Teeth Intact, Dental Advisory Given   Pulmonary asthma , former smoker,    Pulmonary exam normal breath sounds clear to auscultation       Cardiovascular hypertension, Pt. on medications negative cardio ROS Normal cardiovascular exam Rhythm:Regular Rate:Normal  Stress Test 2015 normal   Neuro/Psych PSYCHIATRIC DISORDERS Anxiety Depression negative neurological ROS     GI/Hepatic Neg liver ROS, hiatal hernia, GERD  Medicated and Controlled,  Endo/Other  negative endocrine ROS  Renal/GU negative Renal ROS  negative genitourinary   Musculoskeletal  (+) Arthritis , Osteoarthritis,    Abdominal   Peds  Hematology negative hematology ROS (+)   Anesthesia Other Findings Left middle finger cyst  Reproductive/Obstetrics                            Anesthesia Physical Anesthesia Plan  ASA: II  Anesthesia Plan: Bier Block and MAC and Bier Block-LIDOCAINE ONLY   Post-op Pain Management:    Induction: Intravenous  PONV Risk Score and Plan: 2 and Midazolam, Dexamethasone and Ondansetron  Airway Management Planned: Natural Airway and Simple Face Mask  Additional Equipment:   Intra-op Plan:   Post-operative Plan:   Informed Consent: I have reviewed the patients History and Physical, chart, labs and discussed the procedure including the risks, benefits and alternatives for the proposed anesthesia with the patient or authorized representative who has indicated his/her understanding and acceptance.   Dental advisory given  Plan Discussed with: CRNA  Anesthesia Plan Comments:         Anesthesia Quick  Evaluation

## 2018-09-29 NOTE — Transfer of Care (Signed)
Immediate Anesthesia Transfer of Care Note  Patient: Shelley Spencer  Procedure(s) Performed: LEFT MIDDLE FINGER CYST REMOVAL AND DEBRIDEMENT OF DISTAL INTERPHALANGEAL JOINT (Left Hand)  Patient Location: PACU  Anesthesia Type:Bier block  Level of Consciousness: awake, alert , oriented and patient cooperative  Airway & Oxygen Therapy: Patient Spontanous Breathing and Patient connected to face mask oxygen  Post-op Assessment: Report given to RN and Post -op Vital signs reviewed and stable  Post vital signs: Reviewed and stable  Last Vitals:  Vitals Value Taken Time  BP    Temp    Pulse    Resp    SpO2      Last Pain:  Vitals:   09/29/18 0905  TempSrc: Oral  PainSc: 2       Patients Stated Pain Goal: 2 (43/83/77 9396)  Complications: No apparent anesthesia complications

## 2018-09-29 NOTE — Anesthesia Procedure Notes (Signed)
Anesthesia Regional Block: Bier block (IV Regional)   Pre-Anesthetic Checklist: ,, timeout performed, Correct Patient, Correct Site, Correct Laterality, Correct Procedure,, site marked, surgical consent,, at surgeon's request  Laterality: Left     Needles:  Injection technique: Single-shot  Needle Type: Other      Needle Gauge: 20     Additional Needles:   Procedures:,,,,, intact distal pulses, Esmarch exsanguination, single tourniquet utilized,  Narrative:   Performed by: Personally       

## 2018-09-29 NOTE — H&P (Signed)
Shelley Spencer is an 73 y.o. female.   Chief Complaint:mass left middle finger HPI: Shelley Spencer  is complaining of a mass on her left middle finger. This been present for approximately 2 months. She has had a mucoid cyst excised on her opposite side. She states approximately 10-day gauge days ago she picked it opened and drained fluid. She has not taken anything for this. She has a mild discomfort pain especially with touching it dull aching in nature. Not had any fevers or chills. She has no history of injury to the area. Does not take nonsteroidal anti-inflammatories due to blood pressure problems. She has a history of arthritis and no history of diabetes thyroid problems or gout. Her family history is positive diabetes thyroid problems arthritis but negative for gout.    Past Medical History:  Diagnosis Date  . Anemia   . Anxiety   . Arthritis   . Asthma   . Bicornuate uterus    double cervix  . Colon polyp   . Depression   . Deviated septum   . DJD (degenerative joint disease)    L-Spine  . Fibroids   . GERD (gastroesophageal reflux disease)   . Hematuria   . Hiatal hernia   . HNP (herniated nucleus pulposus)    lower back  . Hyperlipidemia   . Hypertension   . MVP (mitral valve prolapse)    pt denies, pt states she had a Echo ?2008 and it did not show MVP  . Pre-diabetes   . Retinal tear 2005   Small right retinal tear  . Shingles 1980's   on her waist    Past Surgical History:  Procedure Laterality Date  . ANTERIOR CERVICAL DECOMP/DISCECTOMY FUSION N/A 09/05/2017   Procedure: Cervical four-five, Cervical five-six, Cervical six-seven Anterior cervical decompression/discectomy/fusion;  Surgeon: Erline Levine, MD;  Location: [place];  Service: Neurosurgery;  Laterality: N/A;  . bone spur surgery Bilateral    x 3  . CARDIOVASCULAR STRESS TEST  02/08/2014  . COLONOSCOPY    . FINGER SURGERY Left    left third finger mucoid cyst removed  . FOOT NEUROMA SURGERY Bilateral   .  HYSTEROSCOPY     D&C PMB Endo Cx Polyp  . KNEE ARTHROSCOPY WITH MENISCAL REPAIR Right 2016   --Flossie Dibble, MD  . LAPAROSCOPIC HYSTERECTOMY  7/209/10   R-TLH/BSO  Fibroids, BAck Pain, Adenomyosis  . LUMBAR FUSION    . removal vaginal septum    . TUBAL LIGATION      Family History  Problem Relation Age of Onset  . Heart failure Father        Possibly related to EtOH  . Alcohol abuse Father   . Emphysema Father   . Emphysema Mother   . Heart failure Mother        Possibly related to EtOH  . Diabetes Mother   . Alcohol abuse Mother   . Valvular heart disease Brother   . Bipolar disorder Brother   . Crohn's disease Daughter   . Thyroid disease Daughter   . Colon cancer Neg Hx   . Stomach cancer Neg Hx   . Esophageal cancer Neg Hx   . Rectal cancer Neg Hx   . Liver cancer Neg Hx    Social History:  reports that she quit smoking about 39 years ago. Her smoking use included cigarettes. She has a 20.00 pack-year smoking history. She has never used smokeless tobacco. She reports current alcohol use of about 1.0 standard drinks  of alcohol per week. She reports that she does not use drugs.  Allergies:  Allergies  Allergen Reactions  . Amlodipine Other (See Comments)    Legs swell some and she doesn't like it.  . Fenofibrate Other (See Comments)    Leg pain  . Omeprazole Other (See Comments)    Unknown  . Simvastatin Other (See Comments)    Myalgia, leg pains  . Singulair [Montelukast Sodium] Other (See Comments)    Affected mood  . Statins Other (See Comments)    Myalgia, leg pains    No medications prior to admission.    No results found for this or any previous visit (from the past 48 hour(s)).  No results found.   Pertinent items are noted in HPI.  Height 5\' 2"  (1.575 m), weight 81.6 kg.  General appearance: alert, cooperative and appears stated age Head: Normocephalic, without obvious abnormality Neck: no JVD Resp: clear to auscultation bilaterally Cardio:  regular rate and rhythm, S1, S2 normal, no murmur, click, rub or gallop GI: soft, non-tender; bowel sounds normal; no masses,  no organomegaly Extremities: mass left middle finger Pulses: 2+ and symmetric Skin: Skin color, texture, turgor normal. No rashes or lesions Neurologic: Grossly normal Incision/Wound: na  Assessment/Plan Assessment:  1. Mucoid cyst, left middle finger 2. Osteoarthritis of finger of left hand    Plan: We have discussed excision of the cyst debridement of the joint but we recommend placed on an antibiotic for a week.  She is well aware of risks and complications having had this done on her right side. She is aware there is no guarantee to the surgery the possibility of infection recurrence injury to arteries nerves tendons complete relief symptoms dystrophy the possibility stiffness with a significant possibility of recurrence    Daryll Brod 09/29/2018, 5:25 AM

## 2018-09-30 ENCOUNTER — Encounter (HOSPITAL_BASED_OUTPATIENT_CLINIC_OR_DEPARTMENT_OTHER): Payer: Self-pay | Admitting: Orthopedic Surgery

## 2018-09-30 NOTE — Anesthesia Postprocedure Evaluation (Signed)
Anesthesia Post Note  Patient: Shelley Spencer  Procedure(s) Performed: LEFT MIDDLE FINGER CYST REMOVAL AND DEBRIDEMENT OF DISTAL INTERPHALANGEAL JOINT (Left Hand)     Patient location during evaluation: PACU Anesthesia Type: MAC and Bier Block Level of consciousness: awake and alert Pain management: pain level controlled Vital Signs Assessment: post-procedure vital signs reviewed and stable Respiratory status: spontaneous breathing, nonlabored ventilation, respiratory function stable and patient connected to nasal cannula oxygen Cardiovascular status: stable and blood pressure returned to baseline Postop Assessment: no apparent nausea or vomiting Anesthetic complications: no    Last Vitals:  Vitals:   09/29/18 1115 09/29/18 1140  BP: (!) 159/80 (!) 180/83  Pulse: 72 74  Resp: 16 16  Temp:  36.4 C  SpO2: 92% 95%    Last Pain:  Vitals:   09/30/18 0937  TempSrc:   PainSc: 1                  Zolton Dowson L Indea Dearman

## 2019-03-15 ENCOUNTER — Ambulatory Visit (INDEPENDENT_AMBULATORY_CARE_PROVIDER_SITE_OTHER): Payer: Medicare Other | Admitting: Cardiology

## 2019-03-15 ENCOUNTER — Other Ambulatory Visit: Payer: Self-pay

## 2019-03-15 ENCOUNTER — Encounter: Payer: Self-pay | Admitting: Cardiology

## 2019-03-15 VITALS — BP 92/60 | HR 98 | Ht 62.0 in | Wt 194.0 lb

## 2019-03-15 DIAGNOSIS — R0600 Dyspnea, unspecified: Secondary | ICD-10-CM

## 2019-03-15 DIAGNOSIS — R0609 Other forms of dyspnea: Secondary | ICD-10-CM

## 2019-03-15 DIAGNOSIS — I10 Essential (primary) hypertension: Secondary | ICD-10-CM | POA: Diagnosis not present

## 2019-03-15 DIAGNOSIS — I209 Angina pectoris, unspecified: Secondary | ICD-10-CM | POA: Diagnosis not present

## 2019-03-15 DIAGNOSIS — R29898 Other symptoms and signs involving the musculoskeletal system: Secondary | ICD-10-CM | POA: Diagnosis not present

## 2019-03-15 DIAGNOSIS — E7800 Pure hypercholesterolemia, unspecified: Secondary | ICD-10-CM

## 2019-03-15 MED ORDER — NITROGLYCERIN 0.4 MG SL SUBL: 0.4000 mg | SUBLINGUAL_TABLET | SUBLINGUAL | 3 refills | Status: DC | PRN

## 2019-03-15 MED ORDER — ASPIRIN EC 81 MG PO TBEC: 81.0000 mg | DELAYED_RELEASE_TABLET | Freq: Every day | ORAL | 3 refills | Status: DC

## 2019-03-15 MED ORDER — METOPROLOL SUCCINATE ER 25 MG PO TB24: 25.0000 mg | ORAL_TABLET | Freq: Every day | ORAL | 1 refills | Status: DC

## 2019-03-15 MED ORDER — OLMESARTAN MEDOXOMIL 20 MG PO TABS: 20.0000 mg | ORAL_TABLET | Freq: Every day | ORAL | 11 refills | Status: DC

## 2019-03-15 NOTE — Progress Notes (Signed)
Primary Physician:  Crist Infante, MD   Patient ID: Shelley Spencer, female    DOB: 05-04-1946, 73 y.o.   MRN: [id number]  Subjective:    Chief Complaint  Patient presents with  . Shortness of Breath  . Fatigue  . Follow-up    HPI: Shelley Spencer  is a 73 y.o. female  with hypertension, hyperlipidemia, hyperglycemia, CKD stage 3, former tobacco use referred to [place] for evaluation of chest pain and dyspnea on exertion.  Symptoms started 1 month ago and have progressively worsened. Her main complaint is dyspnea on exertion. She notices this even with minimal exertion she gets short of breath and has to sit down and rest. She also has associated bilateral arm weakness that improves with resting. She does have some pain across her back and occasional chest heaviness. She denies any PND, orthopnea, leg swelling, or palpitations. She does report gaining 10 lbs over the last few months.  She has had negative lexiscan nuclear stress test both in 2015 and 2013 after she had an episode of chest heaviness that radiated up to her jaw.   She does care for her elderly husband that has had heart transplant and has a lot of stress related to this.   States that hypertension and hyperlipidemia are well controlled. She is a former smoker that quit in 1981.    Past Medical History:  Diagnosis Date  . Anemia   . Anxiety   . Arthritis   . Asthma   . Bicornuate uterus    double cervix  . Colon polyp   . Depression   . Deviated septum   . DJD (degenerative joint disease)    L-Spine  . Fibroids   . GERD (gastroesophageal reflux disease)   . Hematuria   . Hiatal hernia   . HNP (herniated nucleus pulposus)    lower back  . Hyperlipidemia   . Hypertension   . MVP (mitral valve prolapse)    pt denies, pt states she had a Echo ?2008 and it did not show MVP  . Pre-diabetes   . Retinal tear 2005   Small right retinal tear  . Shingles 1980's   on her waist    Past Surgical History:  Procedure  Laterality Date  . ANTERIOR CERVICAL DECOMP/DISCECTOMY FUSION N/A 09/05/2017   Procedure: Cervical four-five, Cervical five-six, Cervical six-seven Anterior cervical decompression/discectomy/fusion;  Surgeon: Erline Levine, MD;  Location: [facility];  Service: Neurosurgery;  Laterality: N/A;  . bone spur surgery Bilateral    x 3  . CARDIOVASCULAR STRESS TEST  02/08/2014  . COLONOSCOPY    . CYST EXCISION Left 09/29/2018   Procedure: LEFT MIDDLE FINGER CYST REMOVAL AND DEBRIDEMENT OF DISTAL INTERPHALANGEAL JOINT;  Surgeon: Daryll Brod, MD;  Location: [place];  Service: Orthopedics;  Laterality: Left;  . FINGER SURGERY Left    left third finger mucoid cyst removed  . FOOT NEUROMA SURGERY Bilateral   . HYSTEROSCOPY     D&C PMB Endo Cx Polyp  . KNEE ARTHROSCOPY WITH MENISCAL REPAIR Right 2016   --Flossie Dibble, MD  . LAPAROSCOPIC HYSTERECTOMY  7/209/10   R-TLH/BSO  Fibroids, BAck Pain, Adenomyosis  . LUMBAR FUSION    . removal vaginal septum    . TUBAL LIGATION      Social History   Socioeconomic History  . Marital status: Married    Spouse name: Not on file  . Number of children: 2  . Years of education: Not on file  .  Highest education level: Not on file  Occupational History  . Occupation: retired Product manager: RETIRED  Social Needs  . Financial resource strain: Not on file  . Food insecurity    Worry: Not on file    Inability: Not on file  . Transportation needs    Medical: Not on file    Non-medical: Not on file  Tobacco Use  . Smoking status: Former Smoker    Packs/day: 1.00    Years: 20.00    Pack years: 20.00    Types: Cigarettes    Quit date: 09/24/1979    Years since quitting: 39.4  . Smokeless tobacco: Never Used  Substance and Sexual Activity  . Alcohol use: Yes    Alcohol/week: 1.0 standard drinks    Types: 1 Standard drinks or equivalent per week    Comment: rarely  . Drug use: No  . Sexual activity: Yes    Partners: Male    Birth  control/protection: Surgical    Comment: R-TLH/BSO  Lifestyle  . Physical activity    Days per week: Not on file    Minutes per session: Not on file  . Stress: Not on file  Relationships  . Social Herbalist on phone: Not on file    Gets together: Not on file    Attends religious service: Not on file    Active member of club or organization: Not on file    Attends meetings of clubs or organizations: Not on file    Relationship status: Not on file  . Intimate partner violence    Fear of current or ex partner: Not on file    Emotionally abused: Not on file    Physically abused: Not on file    Forced sexual activity: Not on file  Other Topics Concern  . Not on file  Social History Narrative   Husband with heart transplant.  Lives with husband and son.    Review of Systems  Constitution: Negative for decreased appetite, malaise/fatigue, weight gain and weight loss.  Eyes: Negative for visual disturbance.  Cardiovascular: Positive for chest pain and dyspnea on exertion. Negative for claudication, leg swelling, orthopnea, palpitations and syncope.  Respiratory: Positive for shortness of breath (asthma). Negative for hemoptysis and wheezing.   Endocrine: Negative for cold intolerance and heat intolerance.  Hematologic/Lymphatic: Does not bruise/bleed easily.  Skin: Negative for nail changes.  Musculoskeletal: Negative for muscle weakness and myalgias.  Gastrointestinal: Negative for abdominal pain, change in bowel habit, nausea and vomiting.  Neurological: Positive for focal weakness (exertional bilateral arm weakness). Negative for difficulty with concentration, dizziness and headaches.  Psychiatric/Behavioral: Negative for altered mental status and suicidal ideas.  All other systems reviewed and are negative.     Objective:  Blood pressure 92/60, pulse 98, height 5\' 2"  (1.575 m), weight 194 lb (88 kg), SpO2 97 %. Body mass index is 35.48 kg/m.    Physical Exam   Constitutional: She is oriented to person, place, and time. Vital signs are normal. She appears well-developed and well-nourished. She is cooperative.  Moderately obese  HENT:  Head: Normocephalic and atraumatic.  Neck: Normal range of motion.  Cardiovascular: Normal rate, regular rhythm, normal heart sounds and intact distal pulses.  Pulmonary/Chest: Effort normal and breath sounds normal. No accessory muscle usage. No respiratory distress.  Abdominal: Soft. Bowel sounds are normal.  Musculoskeletal: Normal range of motion.  Neurological: She is alert and oriented to person, place, and time.  Skin:  Skin is warm and dry.  Vitals reviewed.  Radiology: No results found.  Laboratory examination:    CMP Latest Ref Rng & Units 09/25/2018 09/02/2017 12/22/2013  Glucose 70 - 99 mg/dL 117(H) 88 109(H)  BUN 8 - 23 mg/dL 10 14 11   Creatinine 0.44 - 1.00 mg/dL 1.29(H) 1.27(H) 1.00  Sodium 135 - 145 mmol/L 139 139 141  Potassium 3.5 - 5.1 mmol/L 4.4 3.2(L) 3.7  Chloride 98 - 111 mmol/L 109 105 102  CO2 22 - 32 mmol/L 22 26 24   Calcium 8.9 - 10.3 mg/dL 9.4 8.9 8.8  Total Protein 6.0 - 8.3 g/dL - - -  Total Bilirubin 0.3 - 1.2 mg/dL - - -  Alkaline Phos 39 - 117 U/L - - -  AST 0 - 37 U/L - - -  ALT 0 - 35 U/L - - -   CBC Latest Ref Rng & Units 09/02/2017 12/22/2013 04/12/2009  WBC 4.0 - 10.5 K/uL 6.4 7.3 16.5(H)  Hemoglobin 12.0 - 15.0 g/dL 13.4 13.5 11.3(L)  Hematocrit 36.0 - 46.0 % 41.5 40.6 34.8(L)  Platelets 150 - 400 K/uL 232 230 257   Lipid Panel  No results found for: CHOL, TRIG, HDL, CHOLHDL, VLDL, LDLCALC, LDLDIRECT HEMOGLOBIN A1C Lab Results  Component Value Date   HGBA1C 5.7 (H) 09/02/2017   MPG 116.89 09/02/2017   TSH No results for input(s): TSH in the last 8760 hours.  PRN Meds:. Medications Discontinued During This Encounter  Medication Reason  . albuterol (PROVENTIL HFA;VENTOLIN HFA) 108 (90 BASE) MCG/ACT inhaler Error  . losartan (COZAAR) 100 MG tablet Error  .  olmesartan (BENICAR) 40 MG tablet Dose change   Current Meds  Medication Sig  . budesonide-formoterol (SYMBICORT) 160-4.5 MCG/ACT inhaler Inhale 2 puffs into the lungs 2 (two) times daily as needed (for shortness of breath or wheezing).   Marland Kitchen buPROPion (WELLBUTRIN XL) 150 MG 24 hr tablet Take 150 mg by mouth daily.  . cetirizine (ZYRTEC) 10 MG tablet Take 10 mg by mouth daily.  Marland Kitchen docusate sodium (COLACE) 100 MG capsule Take 1 capsule (100 mg total) by mouth 2 (two) times daily. (Patient taking differently: Take 100 mg by mouth as needed. )  . doxazosin (CARDURA) 4 MG tablet Take 4 mg by mouth daily.   . furosemide (LASIX) 20 MG tablet Take 20 mg by mouth daily.  . Multiple Vitamins-Minerals (MULTIVITAMIN PO) Take 1 tablet by mouth daily.  . niacin (NIASPAN) 1000 MG CR tablet Take 1,000 mg by mouth at bedtime.  Marland Kitchen OVER THE COUNTER MEDICATION Take 1 tablet by mouth daily. Magnesium + Vitamin D  . pantoprazole (PROTONIX) 40 MG tablet Take 1 tablet (40 mg total) by mouth 2 (two) times daily. (Patient taking differently: Take 40 mg by mouth daily. )  . PARoxetine (PAXIL) 40 MG tablet Take 40 mg by mouth every morning.  . potassium chloride SA (K-DUR) 20 MEQ tablet Take 20 mEq by mouth daily.   . traMADol (ULTRAM) 50 MG tablet Take 1 tablet (50 mg total) by mouth every 6 (six) hours as needed.  . traZODone (DESYREL) 150 MG tablet Take 150 mg by mouth at bedtime.  . vitamin B-12 (CYANOCOBALAMIN) 1000 MCG tablet Take 1,000 mcg by mouth daily.  . [DISCONTINUED] olmesartan (BENICAR) 40 MG tablet Take 40 mg by mouth daily.    Cardiac Studies:     Assessment:     ICD-10-CM   1. Dyspnea on exertion  R06.09 EKG 12-Lead    PCV ECHOCARDIOGRAM COMPLETE  CT CORONARY MORPH W/CTA COR W/SCORE W/CA W/CM &/OR WO/CM  2. Bilateral arm weakness  R29.898   3. Essential hypertension  I10   4. Pure hypercholesterolemia  E78.00   5. Angina pectoris (HCC)  I20.9 PCV ECHOCARDIOGRAM COMPLETE    CT CORONARY MORPH  W/CTA COR W/SCORE W/CA W/CM &/OR WO/CM    EKG 03/15/2019: Normal sinus rhythm at 90 bpm, left atrial enlargement, left axis deviation, no evidence of ischemia.  Recommendations:    Patient has had progressively worsening symptoms over the last 1 month that are concerning for angina. Has risk factors for CAD. As she has had negative nuclear stress test in 2015, will schedule for Coronary CTA for further evaluation. I suspect her arm weakness may be possibly related to spinal issues. I will plan for echocardiogram to exclude any structural abnormalities. Physical exam and EKG are unremarkable.   I have advised her to start 81 mg ASA daily until she can have further cardiac workup. She was also given nitroglycerin for as needed use. Blood pressure is low today, but asymptomatic. She states blood pressure is never this low. Will decrease her Olmesartan and start Metoprolol succinate 25 mg daily for cardiac protection. Advised her to continue to monitor her blood pressure at home regularly. I do not have recent labs, will obtain from PCP for further evaluation. I will see her back after the test for follow up.    *I have discussed this case with Dr. Virgina Jock and he personally examined the patient and participated in formulating the plan.*    Miquel Dunn, MSN, APRN, FNP-C [facility]. [place] Office: [phone] Fax: [phone]

## 2019-03-16 ENCOUNTER — Other Ambulatory Visit: Payer: Self-pay

## 2019-03-18 ENCOUNTER — Encounter: Payer: Self-pay | Admitting: Obstetrics and Gynecology

## 2019-03-18 ENCOUNTER — Ambulatory Visit (INDEPENDENT_AMBULATORY_CARE_PROVIDER_SITE_OTHER): Payer: Medicare Other | Admitting: Obstetrics and Gynecology

## 2019-03-18 ENCOUNTER — Other Ambulatory Visit: Payer: Self-pay | Admitting: Cardiology

## 2019-03-18 ENCOUNTER — Other Ambulatory Visit: Payer: Self-pay

## 2019-03-18 VITALS — BP 142/76 | HR 86 | Temp 97.3°F | Resp 14 | Ht 62.5 in | Wt 194.1 lb

## 2019-03-18 DIAGNOSIS — Z01419 Encounter for gynecological examination (general) (routine) without abnormal findings: Secondary | ICD-10-CM | POA: Diagnosis not present

## 2019-03-18 DIAGNOSIS — I209 Angina pectoris, unspecified: Secondary | ICD-10-CM

## 2019-03-18 NOTE — Progress Notes (Signed)
73 y.o. [id number] Married Caucasian Spencer here for annual exam.    Having some shortness of breath with activity.  Also having fatigue.  Saw her PCP and cardiologist.   Having hot flashes off her her ERT.  She is taking Gabapentin.  No bladder or bowel concerns.    Going to the beach this weekend.  Her husband has had a heart transplant years ago.   Will see her PCP in August.   PCP:   Crist Infante, MD  No LMP recorded (lmp unknown). Patient has had a hysterectomy.           Sexually active: No.  The current method of family planning is status post hysterectomy.    Exercising: No.  The patient does not participate in regular exercise at present. Smoker:  Former smoker   Health Maintenance: Pap:  2009 negative History of abnormal Pap:  yes MMG:  05-27-17 BIRADS 2 benign.   Colonoscopy:  2018 Tubular adenoma [place]. BMD:   2015 Result  Normal - Dr. Joylene Draft.   TDaP:  01/2017 Gardasil:   n/a HIV: donates blood Hep C: donates blood  Screening Labs:  Hb today: PCP, Urine today: not collected   reports that she quit smoking about 39 years ago. Her smoking use included cigarettes. She has a 20.00 pack-year smoking history. She has never used smokeless tobacco. She reports current alcohol use of about 1.0 standard drinks of alcohol per week. She reports that she does not use drugs.  Past Medical History:  Diagnosis Date  . Anemia   . Anxiety   . Arthritis   . Asthma   . Bicornuate uterus    double cervix  . Colon polyp   . Depression   . Deviated septum   . DJD (degenerative joint disease)    L-Spine  . Fibroids   . GERD (gastroesophageal reflux disease)   . Hematuria   . Hiatal hernia   . HNP (herniated nucleus pulposus)    lower back  . Hyperlipidemia   . Hypertension   . MVP (mitral valve prolapse)    pt denies, pt states she had a Echo ?2008 and it did not show MVP  . Pre-diabetes   . Retinal tear 2005   Small right retinal tear  . Shingles 1980's   on her  waist    Past Surgical History:  Procedure Laterality Date  . ANTERIOR CERVICAL DECOMP/DISCECTOMY FUSION N/A 09/05/2017   Procedure: Cervical four-five, Cervical five-six, Cervical six-seven Anterior cervical decompression/discectomy/fusion;  Surgeon: Erline Levine, MD;  Location: [place];  Service: Neurosurgery;  Laterality: N/A;  . bone spur surgery Bilateral    x 3  . CARDIOVASCULAR STRESS TEST  02/08/2014  . COLONOSCOPY    . CYST EXCISION Left 09/29/2018   Procedure: LEFT MIDDLE FINGER CYST REMOVAL AND DEBRIDEMENT OF DISTAL INTERPHALANGEAL JOINT;  Surgeon: Daryll Brod, MD;  Location: [place];  Service: Orthopedics;  Laterality: Left;  . FINGER SURGERY Left    left third finger mucoid cyst removed  . FOOT NEUROMA SURGERY Bilateral   . HYSTEROSCOPY     D&C PMB Endo Cx Polyp  . KNEE ARTHROSCOPY WITH MENISCAL REPAIR Right 2016   --Flossie Dibble, MD  . LAPAROSCOPIC HYSTERECTOMY  7/209/10   R-TLH/BSO  Fibroids, BAck Pain, Adenomyosis  . LUMBAR FUSION    . removal vaginal septum    . TUBAL LIGATION      Current Outpatient Medications  Medication Sig Dispense Refill  . budesonide-formoterol (SYMBICORT)  160-4.5 MCG/ACT inhaler Inhale 2 puffs into the lungs 2 (two) times daily as needed (for shortness of breath or wheezing).     Marland Kitchen buPROPion (WELLBUTRIN XL) 150 MG 24 hr tablet Take 150 mg by mouth daily.    . cetirizine (ZYRTEC) 10 MG tablet Take 10 mg by mouth daily.    Marland Kitchen docusate sodium (COLACE) 100 MG capsule Take 1 capsule (100 mg total) by mouth 2 (two) times daily. (Patient taking differently: Take 100 mg by mouth as needed. ) 60 capsule 0  . doxazosin (CARDURA) 4 MG tablet Take 4 mg by mouth daily.     . furosemide (LASIX) 20 MG tablet Take 20 mg by mouth daily.    Marland Kitchen gabapentin (NEURONTIN) 100 MG capsule Take 100 mg by mouth at bedtime.    . metoprolol succinate (TOPROL-XL) 25 MG 24 hr tablet Take 1 tablet (25 mg total) by mouth daily. Take with or immediately following  a meal. 30 tablet 1  . Multiple Vitamins-Minerals (MULTIVITAMIN PO) Take 1 tablet by mouth daily.    . niacin (NIASPAN) 1000 MG CR tablet Take 1,000 mg by mouth at bedtime.    . nitroGLYCERIN (NITROSTAT) 0.4 MG SL tablet Place 1 tablet (0.4 mg total) under the tongue every 5 (five) minutes as needed for chest pain. 25 tablet 3  . olmesartan (BENICAR) 20 MG tablet Take 1 tablet (20 mg total) by mouth daily. 30 tablet 11  . OVER THE COUNTER MEDICATION Take 1 tablet by mouth daily. Magnesium + Vitamin D    . pantoprazole (PROTONIX) 40 MG tablet Take 1 tablet (40 mg total) by mouth 2 (two) times daily. (Patient taking differently: Take 40 mg by mouth daily. ) 180 tablet 3  . PARoxetine (PAXIL) 40 MG tablet Take 40 mg by mouth every morning.    . potassium chloride SA (K-DUR) 20 MEQ tablet Take 20 mEq by mouth daily.     . traZODone (DESYREL) 150 MG tablet Take 150 mg by mouth at bedtime.    . vitamin B-12 (CYANOCOBALAMIN) 1000 MCG tablet Take 1,000 mcg by mouth daily.    Marland Kitchen aspirin EC 81 MG tablet Take 1 tablet (81 mg total) by mouth daily. (Patient not taking: Reported on 03/18/2019) 90 tablet 3  . traMADol (ULTRAM) 50 MG tablet Take 1 tablet (50 mg total) by mouth every 6 (six) hours as needed. (Patient not taking: Reported on 03/18/2019) 20 tablet 0   No current facility-administered medications for this visit.     Family History  Problem Relation Age of Onset  . Heart failure Father        Possibly related to EtOH  . Alcohol abuse Father   . Emphysema Father   . Emphysema Mother   . Heart failure Mother        Possibly related to EtOH  . Diabetes Mother   . Alcohol abuse Mother   . Valvular heart disease Brother   . Bipolar disorder Brother   . Crohn's disease Daughter   . Thyroid disease Daughter   . Colon cancer Neg Hx   . Stomach cancer Neg Hx   . Esophageal cancer Neg Hx   . Rectal cancer Neg Hx   . Liver cancer Neg Hx     Review of Systems  Constitutional: Negative.   HENT:  Negative.   Eyes: Negative.   Respiratory: Negative.   Cardiovascular: Negative.   Gastrointestinal: Negative.   Endocrine: Negative.   Genitourinary: Negative.   Musculoskeletal: Negative.  Skin: Negative.   Allergic/Immunologic: Negative.   Neurological: Negative.   Hematological: Negative.   Psychiatric/Behavioral: Negative.     Exam:   BP (!) 142/76 (BP Location: Right Arm, Patient Position: Sitting, Cuff Size: Normal)   Pulse 86   Temp (!) 97.3 F (36.3 C) (Oral)   Resp 14   Ht 5' 2.5" (1.588 m)   Wt 194 lb 1.6 oz (88 kg)   LMP  (LMP Unknown)   BMI 34.Shelley kg/m     General appearance: alert, cooperative and appears stated age Head: normocephalic, without obvious abnormality, atraumatic Neck: no adenopathy, supple, symmetrical, trachea midline and thyroid normal to inspection and palpation Lungs: clear to auscultation bilaterally Breasts: normal appearance, no masses or tenderness, No nipple retraction or dimpling, No nipple discharge or bleeding, No axillary adenopathy Heart: regular rate and rhythm Abdomen: soft, non-tender; no masses, no organomegaly Extremities: extremities normal, atraumatic, no cyanosis or edema Skin: skin color, texture, turgor normal. No rashes or lesions Lymph nodes: cervical, supraclavicular, and axillary nodes normal. Neurologic: grossly normal  Pelvic: External genitalia:  no lesions              No abnormal inguinal nodes palpated.              Urethra:  normal appearing urethra with no masses, tenderness or lesions              Bartholins and Skenes: normal                 Vagina: normal appearing vagina with normal color and discharge, no lesions              Cervix: absent              Pap taken: No. Bimanual Exam:  Uterus:  Absent.              Adnexa: no mass, fullness, tenderness              Rectal exam: Yes.  .  Confirms.              Anus:  normal sphincter tone, no lesions  Chaperone was present for exam.  Assessment:    Well woman visit with normal exam. Status post robotic TLH/BSO.  Off ERT.   Menopausal symptoms.  On Gabapentin at hs.  HTN, hyperlipidemia, hx elevated glucose. Shortness of breath - undergoing cardiac evaluation.   Plan: Mammogram screening discussed. Self breast awareness reviewed. Pap and HR HPV as above. Guidelines for Calcium, Vitamin D, regular exercise program including cardiovascular and weight bearing exercise. We discussed the benefit of gabapentin for treating vasomotor symptoms.  She may discuss increasing her dosage with her PCP, but I did discuss potential side effect of dizziness. Follow up annually and prn.    After visit summary provided.

## 2019-03-18 NOTE — Patient Instructions (Signed)

## 2019-03-20 LAB — BASIC METABOLIC PANEL
BUN/Creatinine Ratio: 16 (ref 12–28)
BUN: 20 mg/dL (ref 8–27)
CO2: 23 mmol/L (ref 20–29)
Calcium: 9.7 mg/dL (ref 8.7–10.3)
Chloride: 104 mmol/L (ref 96–106)
Creatinine, Ser: 1.26 mg/dL — ABNORMAL HIGH (ref 0.57–1.00)
GFR calc Af Amer: 50 mL/min/{1.73_m2} — ABNORMAL LOW (ref >59)
GFR calc non Af Amer: 43 mL/min/{1.73_m2} — ABNORMAL LOW (ref >59)
Glucose: 103 mg/dL — ABNORMAL HIGH (ref 65–99)
Potassium: 4.4 mmol/L (ref 3.5–5.2)
Sodium: 145 mmol/L — ABNORMAL HIGH (ref 134–144)

## 2019-03-25 ENCOUNTER — Other Ambulatory Visit (HOSPITAL_COMMUNITY): Payer: Self-pay | Admitting: Emergency Medicine

## 2019-03-25 DIAGNOSIS — I209 Angina pectoris, unspecified: Secondary | ICD-10-CM

## 2019-03-29 ENCOUNTER — Other Ambulatory Visit: Payer: Self-pay

## 2019-03-29 ENCOUNTER — Telehealth (HOSPITAL_COMMUNITY): Payer: Self-pay | Admitting: *Deleted

## 2019-03-29 NOTE — Telephone Encounter (Signed)
Left message with instruction for CT heart and call back number 

## 2019-03-30 ENCOUNTER — Ambulatory Visit (HOSPITAL_COMMUNITY)
Admission: RE | Admit: 2019-03-30 | Discharge: 2019-03-30 | Disposition: A | Discharge status: Home / Self Care | Payer: Medicare Other | Source: Ambulatory Visit | Attending: Cardiology | Admitting: Cardiology

## 2019-03-30 ENCOUNTER — Encounter (HOSPITAL_COMMUNITY)
Admission: RE | Admit: 2019-03-30 | Discharge: 2019-03-30 | Disposition: A | Discharge status: Home / Self Care | Payer: Medicare Other | Source: Ambulatory Visit | Attending: Cardiology | Admitting: Cardiology

## 2019-03-30 ENCOUNTER — Encounter: Payer: Medicare Other | Admitting: *Deleted

## 2019-03-30 DIAGNOSIS — R0609 Other forms of dyspnea: Secondary | ICD-10-CM | POA: Diagnosis present

## 2019-03-30 DIAGNOSIS — I209 Angina pectoris, unspecified: Secondary | ICD-10-CM | POA: Diagnosis present

## 2019-03-30 DIAGNOSIS — Z006 Encounter for examination for normal comparison and control in clinical research program: Secondary | ICD-10-CM

## 2019-03-30 DIAGNOSIS — I2510 Atherosclerotic heart disease of native coronary artery without angina pectoris: Secondary | ICD-10-CM | POA: Diagnosis present

## 2019-03-30 DIAGNOSIS — R0600 Dyspnea, unspecified: Secondary | ICD-10-CM

## 2019-03-30 LAB — BASIC METABOLIC PANEL
Anion gap: 10 (ref 5–15)
BUN: 16 mg/dL (ref 8–23)
CO2: 27 mmol/L (ref 22–32)
Calcium: 9.5 mg/dL (ref 8.9–10.3)
Chloride: 105 mmol/L (ref 98–111)
Creatinine, Ser: 1.23 mg/dL — ABNORMAL HIGH (ref 0.44–1.00)
GFR calc Af Amer: 51 mL/min — ABNORMAL LOW (ref >60)
GFR calc non Af Amer: 44 mL/min — ABNORMAL LOW (ref >60)
Glucose, Bld: 95 mg/dL (ref 70–99)
Potassium: 4.3 mmol/L (ref 3.5–5.1)
Sodium: 142 mmol/L (ref 135–145)

## 2019-03-30 MED ORDER — NITROGLYCERIN 0.4 MG SL SUBL
SUBLINGUAL_TABLET | SUBLINGUAL | Status: AC
  Filled 2019-03-30: qty 2

## 2019-03-30 MED ORDER — SODIUM BICARBONATE BOLUS VIA INFUSION
INTRAVENOUS | Status: AC
  Administered 2019-03-30: 75 meq via INTRAVENOUS
  Filled 2019-03-30: qty 1

## 2019-03-30 MED ORDER — NITROGLYCERIN 0.4 MG SL SUBL
0.8000 mg | SUBLINGUAL_TABLET | SUBLINGUAL | Status: DC | PRN
  Administered 2019-03-30: 0.8 mg via SUBLINGUAL

## 2019-03-30 MED ORDER — SODIUM BICARBONATE 8.4 % IV SOLN
INTRAVENOUS | Status: DC
  Filled 2019-03-30: qty 500

## 2019-03-30 MED ORDER — IOHEXOL 350 MG/ML SOLN
100.0000 mL | Freq: Once | INTRAVENOUS | Status: AC | PRN
  Administered 2019-03-30: 100 mL via INTRAVENOUS

## 2019-03-30 NOTE — Research (Signed)
CADFEM Informed Consent                  Subject Name:   Shelley Spencer   Subject met inclusion and exclusion criteria.  The informed consent form, study requirements and expectations were reviewed with the subject and questions and concerns were addressed prior to the signing of the consent form.  The subject verbalized understanding of the trial requirements.  The subject agreed to participate in the CADFEM trial and signed the informed consent.  The informed consent was obtained prior to performance of any protocol-specific procedures for the subject.  A copy of the signed informed consent was given to the subject and a copy was placed in the subject's medical record.   [place] Malyah Ohlrich, Research Assistant  03/30/2019 11:11 a.m.

## 2019-03-31 DIAGNOSIS — I2510 Atherosclerotic heart disease of native coronary artery without angina pectoris: Secondary | ICD-10-CM | POA: Diagnosis not present

## 2019-04-01 NOTE — Progress Notes (Signed)
Please respond or look over

## 2019-04-06 ENCOUNTER — Other Ambulatory Visit: Payer: Self-pay | Admitting: Cardiology

## 2019-04-07 ENCOUNTER — Telehealth: Payer: Self-pay

## 2019-04-07 NOTE — Telephone Encounter (Signed)
Pt aware.//ah

## 2019-04-07 NOTE — Telephone Encounter (Signed)
Pt called wanting to know if the echo she has scheduled is really needed since the CT showed nothing. Please advise.//ah

## 2019-04-07 NOTE — Telephone Encounter (Signed)
Yes, the echo tells me if there is any structural abnormalities with her heart that could be causing her shortness of breath and fatigue.

## 2019-04-23 ENCOUNTER — Ambulatory Visit (INDEPENDENT_AMBULATORY_CARE_PROVIDER_SITE_OTHER): Payer: Medicare Other

## 2019-04-23 ENCOUNTER — Other Ambulatory Visit: Payer: Self-pay

## 2019-04-23 DIAGNOSIS — R0609 Other forms of dyspnea: Secondary | ICD-10-CM | POA: Diagnosis not present

## 2019-04-23 DIAGNOSIS — I209 Angina pectoris, unspecified: Secondary | ICD-10-CM | POA: Diagnosis not present

## 2019-04-23 DIAGNOSIS — R0600 Dyspnea, unspecified: Secondary | ICD-10-CM

## 2019-04-26 ENCOUNTER — Encounter: Payer: Self-pay | Admitting: Cardiology

## 2019-04-28 ENCOUNTER — Encounter: Payer: Self-pay | Admitting: Student

## 2019-04-28 NOTE — H&P (Signed)
TOTAL KNEE ADMISSION H&P  Patient is being admitted for right total knee arthroplasty.  Subjective:  Chief Complaint:right knee pain.  HPI: Shelley Spencer, 73 y.o. female, has a history of pain and functional disability in the right knee due to arthritis and has failed non-surgical conservative treatments for greater than 12 weeks to includecorticosteriod injections, viscosupplementation injections and activity modification.  Onset of symptoms was gradual, starting 5 years ago with gradually worsening course since that time. The patient noted prior procedures on the knee to include  arthroscopy and menisectomy on the right knee(s).  Patient currently rates pain in the right knee(s) at 6 out of 10 with activity. Patient has worsening of pain with activity and weight bearing and pain that interferes with activities of daily living.  Patient has evidence of bone on bone arthritis present in the lateral and patellofemoral compartments by imaging studies. There is no active infection.  Patient Active Problem List   Diagnosis Date Noted   Herniated cervical disc without myelopathy 09/05/2017   Bronchitis 04/16/2017   Microcytosis 11/12/2013   Screening for depression 11/12/2013   Obesity 10/29/2012   Underimmunization status 06/02/2012   Herpes zona 03/01/2012   LBP (low back pain) 02/27/2012   Atypical chest pain 10/31/2011   Overweight(278.02) 10/31/2011   HLD (hyperlipidemia)    Hypertension    Fatigue 10/30/2011   Laryngeal pain 09/05/2011   Benign neoplasm of colon 08/14/2011   Acute bronchitis 07/08/2011   Allergic rhinitis, seasonal 11/30/2009   Airway hyperreactivity 11/30/2009   DDD (degenerative disc disease) 11/30/2009   Clinical depression 11/30/2009   Benign essential HTN 11/30/2009   Acid reflux 11/30/2009   Other and unspecified general psychiatric examination 11/30/2009   Heart murmur, systolic 42/68/3419   Elevated fasting blood sugar 11/30/2009     Past Medical History:  Diagnosis Date   Anemia    Anxiety    Arthritis    Asthma    Bicornuate uterus    double cervix   Colon polyp    Depression    Deviated septum    DJD (degenerative joint disease)    L-Spine   Fibroids    GERD (gastroesophageal reflux disease)    Hematuria    Hiatal hernia    HNP (herniated nucleus pulposus)    lower back   Hyperlipidemia    Hypertension    MVP (mitral valve prolapse)    pt denies, pt states she had a Echo ?2008 and it did not show MVP   Pre-diabetes    Retinal tear 2005   Small right retinal tear   Shingles 1980's   on her waist    Past Surgical History:  Procedure Laterality Date   ANTERIOR CERVICAL DECOMP/DISCECTOMY FUSION N/A 09/05/2017   Procedure: Cervical four-five, Cervical five-six, Cervical six-seven Anterior cervical decompression/discectomy/fusion;  Surgeon: Erline Levine, MD;  Location: [facility];  Service: Neurosurgery;  Laterality: N/A;   bone spur surgery Bilateral    x 3   CARDIOVASCULAR STRESS TEST  02/08/2014   COLONOSCOPY     CYST EXCISION Left 09/29/2018   Procedure: LEFT MIDDLE FINGER CYST REMOVAL AND DEBRIDEMENT OF DISTAL INTERPHALANGEAL JOINT;  Surgeon: Daryll Brod, MD;  Location: [place];  Service: Orthopedics;  Laterality: Left;   FINGER SURGERY Left    left third finger mucoid cyst removed   FOOT NEUROMA SURGERY Bilateral    HYSTEROSCOPY     D&C PMB Endo Cx Polyp   KNEE ARTHROSCOPY WITH MENISCAL REPAIR Right 2016   --  Flossie Dibble, MD   LAPAROSCOPIC HYSTERECTOMY  7/209/10   R-TLH/BSO  Fibroids, BAck Pain, Adenomyosis   LUMBAR FUSION     removal vaginal septum     TUBAL LIGATION      No current facility-administered medications for this encounter.    Current Outpatient Medications  Medication Sig Dispense Refill Last Dose   aspirin EC 81 MG tablet Take 1 tablet (81 mg total) by mouth daily. (Patient not taking: Reported on 03/18/2019) 90 tablet 3 Not  Taking   budesonide-formoterol (SYMBICORT) 160-4.5 MCG/ACT inhaler Inhale 2 puffs into the lungs 2 (two) times daily as needed (for shortness of breath or wheezing).    Taking   buPROPion (WELLBUTRIN XL) 150 MG 24 hr tablet Take 150 mg by mouth daily.   Taking   cetirizine (ZYRTEC) 10 MG tablet Take 10 mg by mouth daily.   Taking   docusate sodium (COLACE) 100 MG capsule Take 1 capsule (100 mg total) by mouth 2 (two) times daily. (Patient taking differently: Take 100 mg by mouth as needed. ) 60 capsule 0 Taking   doxazosin (CARDURA) 4 MG tablet Take 4 mg by mouth daily.    Taking   furosemide (LASIX) 20 MG tablet Take 20 mg by mouth daily.   Taking   gabapentin (NEURONTIN) 100 MG capsule Take 100 mg by mouth at bedtime.      metoprolol succinate (TOPROL-XL) 25 MG 24 hr tablet TAKE 1 TABLET (25 MG TOTAL) BY MOUTH DAILY. TAKE WITH OR IMMEDIATELY FOLLOWING A MEAL. 30 tablet 1    Multiple Vitamins-Minerals (MULTIVITAMIN PO) Take 1 tablet by mouth daily.   Taking   niacin (NIASPAN) 1000 MG CR tablet Take 1,000 mg by mouth at bedtime.   Taking   nitroGLYCERIN (NITROSTAT) 0.4 MG SL tablet Place 1 tablet (0.4 mg total) under the tongue every 5 (five) minutes as needed for chest pain. 25 tablet 3 Taking   olmesartan (BENICAR) 20 MG tablet Take 1 tablet (20 mg total) by mouth daily. 30 tablet 11 Taking   OVER THE COUNTER MEDICATION Take 1 tablet by mouth daily. Magnesium + Vitamin D   Taking   pantoprazole (PROTONIX) 40 MG tablet Take 1 tablet (40 mg total) by mouth 2 (two) times daily. (Patient taking differently: Take 40 mg by mouth daily. ) 180 tablet 3 Taking   PARoxetine (PAXIL) 40 MG tablet Take 40 mg by mouth every morning.   Taking   potassium chloride SA (K-DUR) 20 MEQ tablet Take 20 mEq by mouth daily.    Taking   traMADol (ULTRAM) 50 MG tablet Take 1 tablet (50 mg total) by mouth every 6 (six) hours as needed. (Patient not taking: Reported on 03/18/2019) 20 tablet 0 Not Taking    traZODone (DESYREL) 150 MG tablet Take 150 mg by mouth at bedtime.   Taking   vitamin B-12 (CYANOCOBALAMIN) 1000 MCG tablet Take 1,000 mcg by mouth daily.   Taking   Allergies  Allergen Reactions   Amlodipine Other (See Comments)    Legs swell some and she doesn't like it.   Fenofibrate Other (See Comments)    Leg pain   Omeprazole Other (See Comments)    Unknown   Simvastatin Other (See Comments)    Myalgia, leg pains   Singulair [Montelukast Sodium] Other (See Comments)    Affected mood   Statins Other (See Comments)    Myalgia, leg pains    Social History   Tobacco Use   Smoking status: Former Smoker  Packs/day: 1.00    Years: 20.00    Pack years: 20.00    Types: Cigarettes    Quit date: 09/24/1979    Years since quitting: 39.6   Smokeless tobacco: Never Used  Substance Use Topics   Alcohol use: Yes    Alcohol/week: 1.0 standard drinks    Types: 1 Standard drinks or equivalent per week    Comment: rarely    Family History  Problem Relation Age of Onset   Heart failure Father        Possibly related to EtOH   Alcohol abuse Father    Emphysema Father    Emphysema Mother    Heart failure Mother        Possibly related to EtOH   Diabetes Mother    Alcohol abuse Mother    Valvular heart disease Brother    Bipolar disorder Brother    Crohn's disease Daughter    Thyroid disease Daughter    Colon cancer Neg Hx    Stomach cancer Neg Hx    Esophageal cancer Neg Hx    Rectal cancer Neg Hx    Liver cancer Neg Hx      Review of Systems  Constitutional: Negative for chills and fever.  Respiratory: Negative for cough, sputum production and shortness of breath.   Cardiovascular: Negative for chest pain and palpitations.  Gastrointestinal: Negative for nausea and vomiting.  Musculoskeletal: Positive for joint pain.  Neurological: Negative for tingling and sensory change.    Objective:  Physical Exam Patient is a 73 year old  female.  Well nourished and well developed. General: Alert and oriented x3, cooperative and pleasant, no acute distress. Head: normocephalic, atraumatic, neck supple. Eyes: EOMI. Respiratory: breath sounds clear in all fields, no wheezing, rales, or rhonchi. Cardiovascular: Regular rate and rhythm, no murmurs, gallops or rubs. Abdomen: non-tender to palpation and soft, normoactive bowel sounds.  Musculoskeletal: Right Knee Exam: No intra-articular effusion present. Moderate soft tissue swelling present just proximal to the knee. The range of motion is: 0 to 100 degrees actively, and 0 to 115 degrees passively. No crepitus on range of motion of the knee. Significant medial joint line tenderness. Significant lateral joint line tenderness. The knee is stable.  Calves soft and nontender. Motor function intact in LE. Strength 5/5 LE bilaterally. Neuro: Distal pulses 2+. Sensation to light touch intact in LE.  Vital signs in last 24 hours: Labs:  Estimated body mass index is 34.75 kg/m as calculated from the following:   Height as of 03/30/19: 5\' 2"  (1.575 m).   Weight as of 03/30/19: 86.2 kg.   Imaging Review Plain radiographs demonstrate severe degenerative joint disease of the right knee(s). The overall alignment isneutral. The bone quality appears to be adequate for age and reported activity level.  Assessment/Plan:  End stage arthritis, right knee   The patient history, physical examination, clinical judgment of the provider and imaging studies are consistent with end stage degenerative joint disease of the right knee(s) and total knee arthroplasty is deemed medically necessary. The treatment options including medical management, injection therapy arthroscopy and arthroplasty were discussed at length. The risks and benefits of total knee arthroplasty were presented and reviewed. The risks due to aseptic loosening, infection, stiffness, patella tracking problems, thromboembolic  complications and other imponderables were discussed. The patient acknowledged the explanation, agreed to proceed with the plan and consent was signed. Patient is being admitted for inpatient treatment for surgery, pain control, PT, OT, prophylactic antibiotics, VTE prophylaxis, progressive ambulation and  ADL's and discharge planning. The patient is planning to be discharged home.   Therapy Plans: outpatient therapy at [facility] Disposition: Home with husband and son Planned DVT Prophylaxis: aspirin 325mg  BID DME needed: walker PCP: Dr. Joylene Draft Cardiologist: Dr. Everitt Amber TXA: IV Allergies: NKDA Anesthesia Concerns: none BMI: 32.7  Other: Echo on 7/31, appointment with cardiologist on 8/6.  Patient's anticipated LOS is less than 2 midnights, meeting these requirements: - Lives within 1 hour of care - Has a competent adult at home to recover with post-op recover - NO history of  - Chronic pain requiring opiods  - Diabetes  - Coronary Artery Disease  - Heart failure  - Heart attack  - Stroke  - DVT/VTE  - Cardiac arrhythmia  - Respiratory Failure/COPD  - Renal failure  - Anemia  - Advanced Liver disease  - Patient was instructed on what medications to stop prior to surgery. - Follow-up visit in 2 weeks with Dr. Wynelle Link - Begin physical therapy following surgery - Pre-operative lab work as pre-surgical testing - Prescriptions will be provided in hospital at time of discharge  Griffith Citron, PA-C Orthopedic Surgery [facility] [place]

## 2019-04-29 ENCOUNTER — Encounter: Payer: Self-pay | Admitting: Cardiology

## 2019-04-29 ENCOUNTER — Ambulatory Visit (INDEPENDENT_AMBULATORY_CARE_PROVIDER_SITE_OTHER): Payer: Medicare Other | Admitting: Cardiology

## 2019-04-29 ENCOUNTER — Other Ambulatory Visit: Payer: Self-pay

## 2019-04-29 VITALS — BP 136/75 | HR 72 | Temp 99.1°F | Ht 62.0 in | Wt 196.1 lb

## 2019-04-29 DIAGNOSIS — I2510 Atherosclerotic heart disease of native coronary artery without angina pectoris: Secondary | ICD-10-CM | POA: Diagnosis not present

## 2019-04-29 DIAGNOSIS — M1711 Unilateral primary osteoarthritis, right knee: Secondary | ICD-10-CM

## 2019-04-29 DIAGNOSIS — I10 Essential (primary) hypertension: Secondary | ICD-10-CM | POA: Diagnosis not present

## 2019-04-29 DIAGNOSIS — I351 Nonrheumatic aortic (valve) insufficiency: Secondary | ICD-10-CM

## 2019-04-29 DIAGNOSIS — R5381 Other malaise: Secondary | ICD-10-CM | POA: Diagnosis not present

## 2019-04-29 DIAGNOSIS — R5383 Other fatigue: Secondary | ICD-10-CM

## 2019-04-29 MED ORDER — AMLODIPINE BESYLATE 5 MG PO TABS: 5.0000 mg | ORAL_TABLET | Freq: Every day | ORAL | 2 refills | Status: DC

## 2019-04-29 NOTE — Progress Notes (Signed)
Primary Physician:  Crist Infante, MD   Patient ID: Shelley Spencer, female    DOB: 08-25-1946, 73 y.o.   MRN: [id number]  Subjective:    Chief Complaint  Patient presents with  . Shortness of Breath  . Results    echo, CTA  . Follow-up    6wk    HPI: [name]  is a 73 y.o. female  with hypertension, hyperlipidemia, hyperglycemia, CKD stage 3, former tobacco use recently evaluated by [place] for chest pain and dyspnea on exertion.  She was started on Metoprolol for cardiovascular protection. She underwent coronary CTA and echocardiogram and now presents to discuss results.  She has not had any recurrence of chest pain. Dyspnea on exertion and fatigue are unchanged. Reports blood pressure has been elevated with 503 systolic at home.   She has had negative lexiscan nuclear stress test both in 2015 and 2013.  She does care for her elderly husband that has had heart transplant and has a lot of stress related to this.   States that hypertension and hyperlipidemia are well controlled. She is a former smoker that quit in 1981.    Past Medical History:  Diagnosis Date  . Anemia   . Anxiety   . Arthritis   . Asthma   . Bicornuate uterus    double cervix  . Colon polyp   . Depression   . Deviated septum   . DJD (degenerative joint disease)    L-Spine  . Fibroids   . GERD (gastroesophageal reflux disease)   . Hematuria   . Hiatal hernia   . HNP (herniated nucleus pulposus)    lower back  . Hyperlipidemia   . Hypertension   . MVP (mitral valve prolapse)    pt denies, pt states she had a Echo ?2008 and it did not show MVP  . Pre-diabetes   . Retinal tear 2005   Small right retinal tear  . Shingles 1980's   on her waist    Past Surgical History:  Procedure Laterality Date  . ANTERIOR CERVICAL DECOMP/DISCECTOMY FUSION N/A 09/05/2017   Procedure: Cervical four-five, Cervical five-six, Cervical six-seven Anterior cervical decompression/discectomy/fusion;  Surgeon: Erline Levine, MD;  Location: [facility];  Service: Neurosurgery;  Laterality: N/A;  . bone spur surgery Bilateral    x 3  . CARDIOVASCULAR STRESS TEST  02/08/2014  . COLONOSCOPY    . CYST EXCISION Left 09/29/2018   Procedure: LEFT MIDDLE FINGER CYST REMOVAL AND DEBRIDEMENT OF DISTAL INTERPHALANGEAL JOINT;  Surgeon: Daryll Brod, MD;  Location: [facility];  Service: Orthopedics;  Laterality: Left;  . FINGER SURGERY Left    left third finger mucoid cyst removed  . FOOT NEUROMA SURGERY Bilateral   . HYSTEROSCOPY     [facility] PMB Endo Cx Polyp  . KNEE ARTHROSCOPY WITH MENISCAL REPAIR Right 2016   --Flossie Dibble, MD  . LAPAROSCOPIC HYSTERECTOMY  7/209/10   R-TLH/BSO  Fibroids, BAck Pain, Adenomyosis  . LUMBAR FUSION    . removal vaginal septum    . TUBAL LIGATION      Social History   Socioeconomic History  . Marital status: Married    Spouse name: Not on file  . Number of children: 2  . Years of education: Not on file  . Highest education level: Not on file  Occupational History  . Occupation: retired Product manager: RETIRED  Social Needs  . Financial resource strain: Not on file  . Food insecurity  Worry: Not on file    Inability: Not on file  . Transportation needs    Medical: Not on file    Non-medical: Not on file  Tobacco Use  . Smoking status: Former Smoker    Packs/day: 1.00    Years: 20.00    Pack years: 20.00    Types: Cigarettes    Quit date: 09/24/1979    Years since quitting: 39.6  . Smokeless tobacco: Never Used  Substance and Sexual Activity  . Alcohol use: Yes    Alcohol/week: 1.0 standard drinks    Types: 1 Standard drinks or equivalent per week    Comment: rarely  . Drug use: No  . Sexual activity: Yes    Partners: Male    Birth control/protection: Surgical    Comment: R-TLH/BSO  Lifestyle  . Physical activity    Days per week: Not on file    Minutes per session: Not on file  . Stress: Not on file  Relationships  . Social Product manager on phone: Not on file    Gets together: Not on file    Attends religious service: Not on file    Active member of club or organization: Not on file    Attends meetings of clubs or organizations: Not on file    Relationship status: Not on file  . Intimate partner violence    Fear of current or ex partner: Not on file    Emotionally abused: Not on file    Physically abused: Not on file    Forced sexual activity: Not on file  Other Topics Concern  . Not on file  Social History Narrative   Husband with heart transplant.  Lives with husband and son.    Review of Systems  Constitution: Negative for decreased appetite, malaise/fatigue, weight gain and weight loss.  Eyes: Negative for visual disturbance.  Cardiovascular: Positive for chest pain and dyspnea on exertion. Negative for claudication, leg swelling, orthopnea, palpitations and syncope.  Respiratory: Positive for shortness of breath (asthma). Negative for hemoptysis and wheezing.   Endocrine: Negative for cold intolerance and heat intolerance.  Hematologic/Lymphatic: Does not bruise/bleed easily.  Skin: Negative for nail changes.  Musculoskeletal: Negative for muscle weakness and myalgias.  Gastrointestinal: Negative for abdominal pain, change in bowel habit, nausea and vomiting.  Neurological: Positive for focal weakness (exertional bilateral arm weakness). Negative for difficulty with concentration, dizziness and headaches.  Psychiatric/Behavioral: Negative for altered mental status and suicidal ideas.  All other systems reviewed and are negative.     Objective:  Blood pressure 136/75, pulse 72, temperature 99.1 F (37.3 C), height 5\' 2"  (1.575 m), weight 196 lb 1.6 oz (89 kg), SpO2 92 %. Body mass index is 35.87 kg/m.    Physical Exam  Constitutional: She is oriented to person, place, and time. Vital signs are normal. She appears well-developed and well-nourished. She is cooperative.  Moderately obese  HENT:  Head:  Normocephalic and atraumatic.  Neck: Normal range of motion.  Cardiovascular: Normal rate, regular rhythm, normal heart sounds and intact distal pulses.  Pulmonary/Chest: Effort normal and breath sounds normal. No accessory muscle usage. No respiratory distress.  Abdominal: Soft. Bowel sounds are normal.  Musculoskeletal: Normal range of motion.  Neurological: She is alert and oriented to person, place, and time.  Skin: Skin is warm and dry.  Vitals reviewed.  Radiology: No results found.  Laboratory examination:    CMP Latest Ref Rng & Units 03/30/2019 03/19/2019 09/25/2018  Glucose  70 - 99 mg/dL 95 103(H) 117(H)  BUN 8 - 23 mg/dL 16 20 10   Creatinine 0.44 - 1.00 mg/dL 1.23(H) 1.26(H) 1.29(H)  Sodium 135 - 145 mmol/L 142 145(H) 139  Potassium 3.5 - 5.1 mmol/L 4.3 4.4 4.4  Chloride 98 - 111 mmol/L 105 104 109  CO2 22 - 32 mmol/L 27 23 22   Calcium 8.9 - 10.3 mg/dL 9.5 9.7 9.4  Total Protein 6.0 - 8.3 g/dL - - -  Total Bilirubin 0.3 - 1.2 mg/dL - - -  Alkaline Phos 39 - 117 U/L - - -  AST 0 - 37 U/L - - -  ALT 0 - 35 U/L - - -   CBC Latest Ref Rng & Units 09/02/2017 12/22/2013 04/12/2009  WBC 4.0 - 10.5 K/uL 6.4 7.3 16.5(H)  Hemoglobin 12.0 - 15.0 g/dL 13.4 13.5 11.3(L)  Hematocrit 36.0 - 46.0 % 41.5 40.6 34.8(L)  Platelets 150 - 400 K/uL 232 230 257   Lipid Panel  No results found for: CHOL, TRIG, HDL, CHOLHDL, VLDL, LDLCALC, LDLDIRECT HEMOGLOBIN A1C Lab Results  Component Value Date   HGBA1C 5.7 (H) 09/02/2017   MPG 116.89 09/02/2017   TSH No results for input(s): TSH in the last 8760 hours.  PRN Meds:. Medications Discontinued During This Encounter  Medication Reason  . aspirin EC 81 MG tablet Discontinued by provider  . traMADol (ULTRAM) 50 MG tablet No longer needed (for PRN medications)   Current Meds  Medication Sig  . albuterol (VENTOLIN HFA) 108 (90 Base) MCG/ACT inhaler albuterol sulfate HFA 90 mcg/actuation aerosol inhaler  INHALE 1-2 PUFFS EVERY 4 HRS AS  NEEDED SHORTNESS OF BREATH, WHEEZING  . budesonide-formoterol (SYMBICORT) 160-4.5 MCG/ACT inhaler Inhale 2 puffs into the lungs 2 (two) times daily as needed (for shortness of breath or wheezing).   Marland Kitchen buPROPion (WELLBUTRIN XL) 150 MG 24 hr tablet Take 150 mg by mouth daily.  . cetirizine (ZYRTEC) 10 MG tablet Take 10 mg by mouth daily.  Marland Kitchen docusate sodium (COLACE) 100 MG capsule Take 1 capsule (100 mg total) by mouth 2 (two) times daily. (Patient taking differently: Take 100 mg by mouth as needed. )  . doxazosin (CARDURA) 4 MG tablet Take 4 mg by mouth daily.   . furosemide (LASIX) 20 MG tablet Take 20 mg by mouth daily.  Marland Kitchen gabapentin (NEURONTIN) 100 MG capsule Take 100 mg by mouth at bedtime.  . magnesium gluconate (MAGONATE) 500 MG tablet Take 500 mg by mouth daily.  . metoprolol succinate (TOPROL-XL) 25 MG 24 hr tablet TAKE 1 TABLET (25 MG TOTAL) BY MOUTH DAILY. TAKE WITH OR IMMEDIATELY FOLLOWING A MEAL.  . Multiple Vitamins-Minerals (MULTIVITAMIN PO) Take 1 tablet by mouth daily.  . Multiple Vitamins-Minerals (VITAMIN D3 COMPLETE PO) Take by mouth daily.  . niacin (NIASPAN) 1000 MG CR tablet Take 1,000 mg by mouth at bedtime.  . nitroGLYCERIN (NITROSTAT) 0.4 MG SL tablet Place 1 tablet (0.4 mg total) under the tongue every 5 (five) minutes as needed for chest pain.  Marland Kitchen olmesartan (BENICAR) 20 MG tablet Take 1 tablet (20 mg total) by mouth daily.  Marland Kitchen OVER THE COUNTER MEDICATION Take 1 tablet by mouth daily. Magnesium + Vitamin D  . pantoprazole (PROTONIX) 40 MG tablet Take 1 tablet (40 mg total) by mouth 2 (two) times daily. (Patient taking differently: Take 40 mg by mouth daily. )  . PARoxetine (PAXIL) 40 MG tablet Take 40 mg by mouth every morning.  . potassium chloride SA (K-DUR) 20 MEQ  tablet Take 20 mEq by mouth daily.   . traZODone (DESYREL) 150 MG tablet Take 150 mg by mouth at bedtime.  . vitamin B-12 (CYANOCOBALAMIN) 1000 MCG tablet Take 1,000 mcg by mouth daily.    Cardiac Studies:    Coronary CTA 03/30/2019:  1. Coronary calcium score of 33. This was 64 percentile for age and sex matched control.  2. Normal coronary origin with left dominance.  3. Moderate plaque with stenosis 50-69% in the mid LAD. CAD RADS 3. Additional analysis with CT FFR will be submitted.  Coronary CTA FFR 03/30/2019:  1. Left Main: No significant stenosis. 2. LAD: No significant stenosis. 3. LCX: No significant stenosis. 4. RCA: No significant stenosis.  IMPRESSION: 1.  CT FFR analysis didn't show any significant stenosis.  Echocardiogram 04/23/2019 :  Normal LV systolic function with EF 55%. Left ventricle cavity is normal in size. Mild concentric hypertrophy of the left ventricle. Normal global wall motion. Doppler evidence of grade I (impaired) diastolic dysfunction, normal LAP. Calculated EF 55%. Trileaflet aortic valve. Moderate (Grade II) aortic regurgitation. Structurally normal appearing mitral valve. Mild (Grade I) mitral regurgitation.  Assessment:     ICD-10-CM   1. Atherosclerosis of native coronary artery of native heart without angina pectoris  I25.10   2. Moderate aortic regurgitation  I35.1   3. Benign essential HTN  I10   4. Malaise and fatigue  R53.81    R53.83   5. Primary osteoarthritis of right knee  M17.11     EKG 03/15/2019: Normal sinus rhythm at 90 bpm, left atrial enlargement, left axis deviation, no evidence of ischemia.  Recommendations:    I have discussed recently obtain coronary CTA results with the patient, although have 50 to 60% in LAD, no significant stenosis by FFR.  She will need continued risk factor modification.  She is not on statin therapy in view of intolerance and does not wish to be.  She is on niacin and this is being managed by her PCP.  She has not had any further episodes of chest pain and her shortness of breath seems to be improving.  She does continue to complain of fatigue.  At her last office visit, her blood pressure was  markedly low, which she states is unusual for her.  She was started on metoprolol succinate.  She has been monitoring her blood pressure at home and states that systolic blood pressures have been in the 170-180 range.  I have also discussed her echocardiogram results, has normal LVEF with mild blood pressure changes with LVH and diastolic dysfunction.  She is found to have moderate aortic regurgitation.  Do not suspect her symptoms are related to this.  Would recommend repeating echocardiogram in 1 to 2 years for follow-up.  In view of this I will discontinue her metoprolol and switch her to amlodipine 5 mg daily.  She will continue to monitor her blood pressure regularly.  She is also having an annual physical with her PCP in the next few weeks.  She is scheduled for surgery on 8/24 of her right knee.  I have sent letter to surgeon, low risk for perioperative cardiac complication.  No clinical evidence of heart failure by clinical exam.  I will see her back in 8 weeks for follow-up on hypertension.   Miquel Dunn, MSN, APRN, FNP-C [facility]. [place] Office: [phone] Fax: [phone]

## 2019-05-02 ENCOUNTER — Other Ambulatory Visit: Payer: Self-pay | Admitting: Cardiology

## 2019-05-03 NOTE — Telephone Encounter (Signed)
Please read

## 2019-05-10 NOTE — Telephone Encounter (Signed)
From patient.

## 2019-05-11 ENCOUNTER — Other Ambulatory Visit: Payer: Self-pay

## 2019-05-11 NOTE — Progress Notes (Signed)
lov Jeri Lager np cardiology 04-29-19 epic ekg 03-15-19 epic

## 2019-05-11 NOTE — Patient Instructions (Addendum)
YOU NEED TO HAVE A COVID 19 TEST ON Thursday, AUG. 21, 2020  @ 2:50PM, THIS TEST MUST BE DONE BEFORE SURGERY, COME  [place], [place] , [address]. ENTER PRE-SURGICAL TESTING LINE.  ONCE YOUR COVID TEST IS COMPLETED, PLEASE BEGIN THE QUARANTINE INSTRUCTIONS AS OUTLINED IN YOUR HANDOUT.                Shelley Spencer     Your procedure is scheduled on: 05-17-2019   Report to [facility] Main  Entrance    Report to admitting at 800 AM    1 VISITOR IS ALLOWED TO WAIT IN WAITING ROOM  ONLY DAY OF YOUR SURGERY. NO VISITORS ARE ALLOWED IN SHORT STAY OR RECOVERY ROOM.   Call this number if you have problems the morning of surgery [phone]    Remember: [place], NO CHEWING GUM CANDY OR MINTS.   NO SOLID FOOD AFTER MIDNIGHT THE NIGHT PRIOR TO SURGERY.    NOTHING BY MOUTH EXCEPT CLEAR LIQUIDS UNTIL 730 am  .    PLEASE FINISH ENSURE DRINK PER SURGEON ORDER  WHICH NEEDS TO BE COMPLETED AT  730am .   CLEAR LIQUID DIET   Foods Allowed                                                                     Foods Excluded  Coffee and tea, regular and decaf                             liquids that you cannot  Plain Jell-O any favor except red or purple                                           see through such as: Fruit ices (not with fruit pulp)                                     milk, soups, orange juice  Iced Popsicles                                    All solid food Carbonated beverages, regular and diet                                    Cranberry, grape and apple juices Sports drinks like Gatorade Lightly seasoned clear broth or consume(fat free) Sugar, honey syrup  Sample Menu Breakfast                                Lunch  Supper Cranberry juice                    Beef broth                            Chicken broth Jell-O                                     Grape juice                            Apple juice Coffee or tea                        Jell-O                                      Popsicle                                                Coffee or tea                        Coffee or tea  _____________________________________________________________________     Take these medicines the morning of surgery with A SIP OF WATER: BUPROPION, CETIRIZINE, DOXAZOSIN   USE INHALER PER NORMAL ROUTINE MORNING OF SURGERY   BRING RESCUE INHALER DAY OF SURGERY                               You may not have any metal on your body including hair pins and              piercings  Do not wear jewelry, make-up, lotions, powders or perfumes, deodorant             Do not wear nail polish.  Do not shave  48 hours prior to surgery.               Do not bring valuables to the hospital. [place].   Contacts, dentures or bridgework may not be worn into surgery.   Leave suitcase in the car. After surgery it may be brought to your room.      _____________________________________________________________________             [facility] - Preparing for Surgery Before surgery, you can play an important role.  Because skin is not sterile, your skin needs to be as free of germs as possible.  You can reduce the number of germs on your skin by washing with CHG (chlorahexidine gluconate) soap before surgery.  CHG is an antiseptic cleaner which kills germs and bonds with the skin to continue killing germs even after washing. Please DO NOT use if you have an allergy to CHG or antibacterial soaps.  If your skin becomes reddened/irritated stop using the CHG and inform your nurse when you arrive at Short Stay. Do not shave (including legs and underarms) for at least  48 hours prior to the first CHG shower.  You may shave your face/neck. Please follow these instructions carefully:  1.  Shower with CHG Soap the night before surgery and the   morning of Surgery.  2.  If you choose to wash your hair, wash your hair first as usual with your  normal  shampoo.  3.  After you shampoo, rinse your hair and body thoroughly to remove the  shampoo.                             4.  Use CHG as you would any other liquid soap.  You can apply chg directly  to the skin and wash                       Gently with a scrungie or clean washcloth.  5.  Apply the CHG Soap to your body ONLY FROM THE NECK DOWN.   Do not use on face/ open                           Wound or open sores. Avoid contact with eyes, ears mouth and genitals (private parts).                       Wash face,  Genitals (private parts) with your normal soap.             6.  Wash thoroughly, paying special attention to the area where your surgery  will be performed.  7.  Thoroughly rinse your body with warm water from the neck down.  8.  DO NOT shower/wash with your normal soap after using and rinsing off  the CHG Soap.                9.  Pat yourself dry with a clean towel.            10.  Wear clean pajamas.            11.  Place clean sheets on your bed the night of your first shower and do not  sleep with pets. Day of Surgery : Do not apply any lotions/deodorants the morning of surgery.  Please wear clean clothes to the hospital/surgery center.  FAILURE TO FOLLOW THESE INSTRUCTIONS MAY RESULT IN THE CANCELLATION OF YOUR SURGERY PATIENT SIGNATURE_________________________________  NURSE SIGNATURE__________________________________  ________________________________________________________________________   Shelley Spencer  An incentive spirometer is a tool that can help keep your lungs clear and active. This tool measures how well you are filling your lungs with each breath. Taking long deep breaths may help reverse or decrease the chance of developing breathing (pulmonary) problems (especially infection) following:  A long period of time when you are unable to move or be  active. BEFORE THE PROCEDURE   If the spirometer includes an indicator to show your best effort, your nurse or respiratory therapist will set it to a desired goal.  If possible, sit up straight or lean slightly forward. Try not to slouch.  Hold the incentive spirometer in an upright position. INSTRUCTIONS FOR USE  1. Sit on the edge of your bed if possible, or sit up as far as you can in bed or on a chair. 2. Hold the incentive spirometer in an upright position. 3. Breathe out normally. 4. Place the mouthpiece in your mouth and seal your lips tightly  around it. 5. Breathe in slowly and as deeply as possible, raising the piston or the ball toward the top of the column. 6. Hold your breath for 3-5 seconds or for as long as possible. Allow the piston or ball to fall to the bottom of the column. 7. Remove the mouthpiece from your mouth and breathe out normally. 8. Rest for a few seconds and repeat Steps 1 through 7 at least 10 times every 1-2 hours when you are awake. Take your time and take a few normal breaths between deep breaths. 9. The spirometer may include an indicator to show your best effort. Use the indicator as a goal to work toward during each repetition. 10. After each set of 10 deep breaths, practice coughing to be sure your lungs are clear. If you have an incision (the cut made at the time of surgery), support your incision when coughing by placing a pillow or rolled up towels firmly against it. Once you are able to get out of bed, walk around indoors and cough well. You may stop using the incentive spirometer when instructed by your caregiver.  RISKS AND COMPLICATIONS  Take your time so you do not get dizzy or light-headed.  If you are in pain, you may need to take or ask for pain medication before doing incentive spirometry. It is harder to take a deep breath if you are having pain. AFTER USE  Rest and breathe slowly and easily.  It can be helpful to keep track of a log of  your progress. Your caregiver can provide you with a simple table to help with this. If you are using the spirometer at home, follow these instructions: [place] IF:   You are having difficultly using the spirometer.  You have trouble using the spirometer as often as instructed.  Your pain medication is not giving enough relief while using the spirometer.  You develop fever of 100.5 F (38.1 C) or higher. SEEK IMMEDIATE MEDICAL CARE IF:   You cough up bloody sputum that had not been present before.  You develop fever of 102 F (38.9 C) or greater.  You develop worsening pain at or near the incision site. MAKE SURE YOU:   Understand these instructions.  Will watch your condition.  Will get help right away if you are not doing well or get worse. Document Released: 01/20/2007 Document Revised: 12/02/2011 Document Reviewed: 03/23/2007 ExitCare Patient Information 2014 [employer], [place].   ________________________________________________________________________  WHAT IS A BLOOD TRANSFUSION? Blood Transfusion Information  A transfusion is the replacement of blood or some of its parts. Blood is made up of multiple cells which provide different functions.  Red blood cells carry oxygen and are used for blood loss replacement.  White blood cells fight against infection.  Platelets control bleeding.  Plasma helps clot blood.  Other blood products are available for specialized needs, such as hemophilia or other clotting disorders. BEFORE THE TRANSFUSION  Who gives blood for transfusions?   Healthy volunteers who are fully evaluated to make sure their blood is safe. This is blood bank blood. Transfusion therapy is the safest it has ever been in the practice of medicine. Before blood is taken from a donor, a complete history is taken to make sure that person has no history of diseases nor engages in risky social behavior (examples are intravenous drug use or sexual activity  with multiple partners). The donor's travel history is screened to minimize risk of transmitting infections, such as malaria. The  donated blood is tested for signs of infectious diseases, such as HIV and hepatitis. The blood is then tested to be sure it is compatible with you in order to minimize the chance of a transfusion reaction. If you or a relative donates blood, this is often done in anticipation of surgery and is not appropriate for emergency situations. It takes many days to process the donated blood. RISKS AND COMPLICATIONS Although transfusion therapy is very safe and saves many lives, the main dangers of transfusion include:   Getting an infectious disease.  Developing a transfusion reaction. This is an allergic reaction to something in the blood you were given. Every precaution is taken to prevent this. The decision to have a blood transfusion has been considered carefully by your caregiver before blood is given. Blood is not given unless the benefits outweigh the risks. AFTER THE TRANSFUSION  Right after receiving a blood transfusion, you will usually feel much better and more energetic. This is especially true if your red blood cells have gotten low (anemic). The transfusion raises the level of the red blood cells which carry oxygen, and this usually causes an energy increase.  The nurse administering the transfusion will monitor you carefully for complications. HOME CARE INSTRUCTIONS  No special instructions are needed after a transfusion. You may find your energy is better. Speak with your caregiver about any limitations on activity for underlying diseases you may have. SEEK MEDICAL CARE IF:   Your condition is not improving after your transfusion.  You develop redness or irritation at the intravenous (IV) site. SEEK IMMEDIATE MEDICAL CARE IF:  Any of the following symptoms occur over the next 12 hours:  Shaking chills.  You have a temperature by mouth above 102 F (38.9  C), not controlled by medicine.  Chest, back, or muscle pain.  People around you feel you are not acting correctly or are confused.  Shortness of breath or difficulty breathing.  Dizziness and fainting.  You get a rash or develop hives.  You have a decrease in urine output.  Your urine turns a dark color or changes to pink, red, or brown. Any of the following symptoms occur over the next 10 days:  You have a temperature by mouth above 102 F (38.9 C), not controlled by medicine.  Shortness of breath.  Weakness after normal activity.  The white part of the eye turns yellow (jaundice).  You have a decrease in the amount of urine or are urinating less often.  Your urine turns a dark color or changes to pink, red, or brown. Document Released: 09/06/2000 Document Revised: 12/02/2011 Document Reviewed: 04/25/2008 [facility] Patient Information 2014 [place], [place].  _______________________________________________________________________

## 2019-05-12 ENCOUNTER — Other Ambulatory Visit: Payer: Self-pay

## 2019-05-12 ENCOUNTER — Encounter (HOSPITAL_COMMUNITY)
Admission: RE | Admit: 2019-05-12 | Discharge: 2019-05-12 | Disposition: A | Discharge status: Home / Self Care | Payer: Medicare Other | Source: Ambulatory Visit | Attending: Orthopedic Surgery | Admitting: Orthopedic Surgery

## 2019-05-12 ENCOUNTER — Encounter (HOSPITAL_COMMUNITY): Payer: Self-pay

## 2019-05-12 DIAGNOSIS — Z01812 Encounter for preprocedural laboratory examination: Secondary | ICD-10-CM | POA: Diagnosis not present

## 2019-05-12 DIAGNOSIS — Z87891 Personal history of nicotine dependence: Secondary | ICD-10-CM | POA: Insufficient documentation

## 2019-05-12 DIAGNOSIS — I129 Hypertensive chronic kidney disease with stage 1 through stage 4 chronic kidney disease, or unspecified chronic kidney disease: Secondary | ICD-10-CM | POA: Insufficient documentation

## 2019-05-12 DIAGNOSIS — F419 Anxiety disorder, unspecified: Secondary | ICD-10-CM | POA: Insufficient documentation

## 2019-05-12 DIAGNOSIS — K219 Gastro-esophageal reflux disease without esophagitis: Secondary | ICD-10-CM | POA: Diagnosis not present

## 2019-05-12 DIAGNOSIS — F329 Major depressive disorder, single episode, unspecified: Secondary | ICD-10-CM | POA: Diagnosis not present

## 2019-05-12 DIAGNOSIS — M47816 Spondylosis without myelopathy or radiculopathy, lumbar region: Secondary | ICD-10-CM | POA: Insufficient documentation

## 2019-05-12 DIAGNOSIS — N183 Chronic kidney disease, stage 3 (moderate): Secondary | ICD-10-CM | POA: Diagnosis not present

## 2019-05-12 DIAGNOSIS — M1711 Unilateral primary osteoarthritis, right knee: Secondary | ICD-10-CM | POA: Insufficient documentation

## 2019-05-12 DIAGNOSIS — J45909 Unspecified asthma, uncomplicated: Secondary | ICD-10-CM | POA: Insufficient documentation

## 2019-05-12 DIAGNOSIS — I341 Nonrheumatic mitral (valve) prolapse: Secondary | ICD-10-CM | POA: Insufficient documentation

## 2019-05-12 DIAGNOSIS — Z20828 Contact with and (suspected) exposure to other viral communicable diseases: Secondary | ICD-10-CM | POA: Diagnosis not present

## 2019-05-12 DIAGNOSIS — R7303 Prediabetes: Secondary | ICD-10-CM | POA: Insufficient documentation

## 2019-05-12 LAB — SURGICAL PCR SCREEN
MRSA, PCR: NEGATIVE
Staphylococcus aureus: NEGATIVE

## 2019-05-12 LAB — APTT: aPTT: 25 seconds (ref 24–36)

## 2019-05-12 LAB — PROTIME-INR
INR: 0.9 (ref 0.8–1.2)
Prothrombin Time: 12.4 seconds (ref 11.4–15.2)

## 2019-05-12 NOTE — Progress Notes (Signed)
SPOKE W/  Gwyndolyn Saxon     SCREENING SYMPTOMS OF COVID 19:   COUGH--NO  RUNNY NOSE--- NO  SORE THROAT---NO  NASAL CONGESTION----NO  SNEEZING----NO  SHORTNESS OF BREATH---NO  DIFFICULTY BREATHING---NO  TEMP >100.0 -----NO  UNEXPLAINED BODY ACHES------NO  CHILLS -------- NO  HEADACHES ---------NO  LOSS OF SMELL/ TASTE --------NO    HAVE YOU OR ANY FAMILY MEMBER TRAVELLED PAST 14 DAYS OUT OF THE   COUNTY---NO STATE----NO COUNTRY----NO  HAVE YOU OR ANY FAMILY MEMBER BEEN EXPOSED TO ANYONE WITH COVID 19? NO

## 2019-05-12 NOTE — Progress Notes (Addendum)
CBC, CMP, Hbg A1C 05/07/2019 received and placed in chart.  Surgical clearance Dr. Joylene Draft 04/21/2019 in chart.

## 2019-05-13 ENCOUNTER — Other Ambulatory Visit (HOSPITAL_COMMUNITY)
Admission: RE | Admit: 2019-05-13 | Discharge: 2019-05-13 | Disposition: A | Discharge status: Home / Self Care | Payer: Medicare Other | Source: Ambulatory Visit | Attending: Orthopedic Surgery | Admitting: Orthopedic Surgery

## 2019-05-13 DIAGNOSIS — Z01812 Encounter for preprocedural laboratory examination: Secondary | ICD-10-CM | POA: Diagnosis not present

## 2019-05-14 LAB — SARS CORONAVIRUS 2 (TAT 6-24 HRS): SARS Coronavirus 2: NEGATIVE

## 2019-05-14 NOTE — Anesthesia Preprocedure Evaluation (Addendum)
Anesthesia Evaluation  Patient identified by MRN, date of birth, ID band Patient awake    Reviewed: Allergy & Precautions, NPO status , Patient's Chart, lab work & pertinent test results  Airway Mallampati: II  TM Distance: >3 FB Neck ROM: Full    Dental  (+) Teeth Intact, Dental Advisory Given   Pulmonary former smoker,    breath sounds clear to auscultation       Cardiovascular hypertension,  Rhythm:Regular Rate:Normal     Neuro/Psych    GI/Hepatic   Endo/Other    Renal/GU      Musculoskeletal   Abdominal   Peds  Hematology   Anesthesia Other Findings   Reproductive/Obstetrics                            Anesthesia Physical Anesthesia Plan  ASA: III  Anesthesia Plan: Spinal and MAC   Post-op Pain Management:  Regional for Post-op pain   Induction:   PONV Risk Score and Plan:   Airway Management Planned: Natural Airway and Simple Face Mask  Additional Equipment:   Intra-op Plan:   Post-operative Plan: Extubation in OR  Informed Consent: I have reviewed the patients History and Physical, chart, labs and discussed the procedure including the risks, benefits and alternatives for the proposed anesthesia with the patient or authorized representative who has indicated his/her understanding and acceptance.     Dental advisory given  Plan Discussed with: CRNA and Anesthesiologist  Anesthesia Plan Comments: (See PAT note 05/12/2019, Konrad Felix, PA-C)       Anesthesia Quick Evaluation

## 2019-05-14 NOTE — Progress Notes (Signed)
Anesthesia Chart Review   Case: [id number] Date/Time: 05/17/19 1015   Procedure: TOTAL KNEE ARTHROPLASTY (Right ) - 63min   Anesthesia type: Choice   Pre-op diagnosis: right knee osteoarthritis   Location: [facility] ROOM 10 / [facility]   Surgeon: Gaynelle Arabian, MD      DISCUSSION:73 y.o. former smoker (20 pack years, quit 09/24/79) with h/o HLD, asthma, HTN, CKD Stage III, GERD, right knee OA scheduled for above procedure 05/17/2019 with Dr. Gaynelle Arabian.   Last seen by cardiology 04/29/2019.  Seen by Binnie Kand, NP.  Normal stress test 02/08/2014. Moderate aortic regurgitation on Echo, asymptomatic at this time.  Repeat echo recommended in 1-2 years. "low risk for perioperative cardiac complication.  No clinical evidence of heart failure by clinical exam."   Clearance received from PCP, Dr. Crist Infante, 04/21/2019 which states pt is low risk for surgical procedure.   Anticipate pt can proceed with planned procedure barring acute status change.   VS: BP (!) 146/62   Pulse 74   Temp 36.6 C (Oral)   Resp 16   Ht 5\' 2"  (1.575 m)   Wt 88.4 kg   LMP  (LMP Unknown)   SpO2 100%   BMI 35.65 kg/m   PROVIDERS: Crist Infante, MD is PCP   Adrian Prows, MD is Cardiologist  LABS: Labs reviewed: Acceptable for surgery. (all labs ordered are listed, but only abnormal results are displayed)  Labs Reviewed  SURGICAL PCR SCREEN  APTT  PROTIME-INR  TYPE AND SCREEN     IMAGES:   EKG: EKG 03/15/2019: Normal sinus rhythm at 90 bpm, left atrial enlargement, left axis deviation, no evidence of ischemia.  CV: Echocardiogram 04/23/2019 : Normal LV systolic function with EF 55%. Left ventricle cavity is normal in size. Mild concentric hypertrophy of the left ventricle. Normal global wall motion. Doppler evidence of grade I (impaired) diastolic dysfunction, normal LAP. Calculated EF 55%. Trileaflet aortic valve. Moderate (Grade II) aortic regurgitation. Structurally normal appearing mitral valve. Mild  (Grade I) mitral regurgitation. Past Medical History:  Diagnosis Date  . Anemia   . Anxiety   . Arthritis   . Asthma   . Bicornuate uterus    double cervix  . Colon polyp   . Depression   . Deviated septum   . DJD (degenerative joint disease)    L-Spine  . Fibroids   . GERD (gastroesophageal reflux disease)   . Hematuria   . Hiatal hernia   . HNP (herniated nucleus pulposus)    lower back  . Hyperlipidemia   . Hypertension   . MVP (mitral valve prolapse)    pt denies, pt states she had a Echo ?2008 and it did not show MVP  . Pre-diabetes   . Retinal tear 2005   Small right retinal tear  . Shingles 1980's   on her waist    Past Surgical History:  Procedure Laterality Date  . ANTERIOR CERVICAL DECOMP/DISCECTOMY FUSION N/A 09/05/2017   Procedure: Cervical four-five, Cervical five-six, Cervical six-seven Anterior cervical decompression/discectomy/fusion;  Surgeon: Erline Levine, MD;  Location: [facility];  Service: Neurosurgery;  Laterality: N/A;  . bone spur surgery Bilateral    x 3  . CARDIOVASCULAR STRESS TEST  02/08/2014  . COLONOSCOPY    . CYST EXCISION Left 09/29/2018   Procedure: LEFT MIDDLE FINGER CYST REMOVAL AND DEBRIDEMENT OF DISTAL INTERPHALANGEAL JOINT;  Surgeon: Daryll Brod, MD;  Location: [facility];  Service: Orthopedics;  Laterality: Left;  . FINGER SURGERY Left  left third finger mucoid cyst removed  . FOOT NEUROMA SURGERY Bilateral   . HYSTEROSCOPY     D&C PMB Endo Cx Polyp  . KNEE ARTHROSCOPY WITH MENISCAL REPAIR Right 2016   --Flossie Dibble, MD  . LAPAROSCOPIC HYSTERECTOMY  7/209/10   R-TLH/BSO  Fibroids, BAck Pain, Adenomyosis  . LUMBAR FUSION    . removal vaginal septum    . TUBAL LIGATION      MEDICATIONS: . olmesartan (BENICAR) 40 MG tablet  . albuterol (VENTOLIN HFA) 108 (90 Base) MCG/ACT inhaler  . amLODipine (NORVASC) 5 MG tablet  . budesonide-formoterol (SYMBICORT) 160-4.5 MCG/ACT inhaler  . buPROPion (WELLBUTRIN XL) 150 MG  24 hr tablet  . CALCIUM-VITAMIN D PO  . cetirizine (ZYRTEC) 10 MG tablet  . docusate sodium (COLACE) 100 MG capsule  . doxazosin (CARDURA) 4 MG tablet  . furosemide (LASIX) 20 MG tablet  . gabapentin (NEURONTIN) 300 MG capsule  . magnesium gluconate (MAGONATE) 500 MG tablet  . Multiple Vitamins-Minerals (MULTIVITAMIN PO)  . niacin (NIASPAN) 1000 MG CR tablet  . nitroGLYCERIN (NITROSTAT) 0.4 MG SL tablet  . olmesartan (BENICAR) 20 MG tablet  . pantoprazole (PROTONIX) 40 MG tablet  . PARoxetine (PAXIL) 40 MG tablet  . potassium chloride SA (K-DUR) 20 MEQ tablet  . traZODone (DESYREL) 150 MG tablet  . vitamin B-12 (CYANOCOBALAMIN) 1000 MCG tablet   No current facility-administered medications for this encounter.     Maia Plan [facility] Pre-Surgical Testing [phone] 05/14/19  10:10 AM

## 2019-05-16 MED ORDER — BUPIVACAINE LIPOSOME 1.3 % IJ SUSP
20.0000 mL | INTRAMUSCULAR | Status: DC
  Filled 2019-05-16: qty 20

## 2019-05-17 ENCOUNTER — Ambulatory Visit (HOSPITAL_COMMUNITY): Payer: Medicare Other | Admitting: Physician Assistant

## 2019-05-17 ENCOUNTER — Other Ambulatory Visit: Payer: Self-pay

## 2019-05-17 ENCOUNTER — Encounter (HOSPITAL_COMMUNITY)
Admission: RE | Disposition: A | Discharge status: Home / Self Care | Payer: Self-pay | Source: Home / Self Care | Attending: Orthopedic Surgery

## 2019-05-17 ENCOUNTER — Ambulatory Visit (HOSPITAL_COMMUNITY)
Admission: RE | Admit: 2019-05-17 | Discharge: 2019-05-18 | Disposition: A | Discharge status: Home / Self Care | Payer: Medicare Other | Attending: Orthopedic Surgery | Admitting: Orthopedic Surgery

## 2019-05-17 ENCOUNTER — Encounter (HOSPITAL_COMMUNITY): Payer: Self-pay

## 2019-05-17 ENCOUNTER — Ambulatory Visit (HOSPITAL_COMMUNITY): Payer: Medicare Other | Admitting: Certified Registered"

## 2019-05-17 DIAGNOSIS — M1711 Unilateral primary osteoarthritis, right knee: Secondary | ICD-10-CM | POA: Insufficient documentation

## 2019-05-17 DIAGNOSIS — F419 Anxiety disorder, unspecified: Secondary | ICD-10-CM | POA: Insufficient documentation

## 2019-05-17 DIAGNOSIS — F329 Major depressive disorder, single episode, unspecified: Secondary | ICD-10-CM | POA: Insufficient documentation

## 2019-05-17 DIAGNOSIS — Z7951 Long term (current) use of inhaled steroids: Secondary | ICD-10-CM | POA: Diagnosis not present

## 2019-05-17 DIAGNOSIS — E785 Hyperlipidemia, unspecified: Secondary | ICD-10-CM | POA: Diagnosis not present

## 2019-05-17 DIAGNOSIS — I341 Nonrheumatic mitral (valve) prolapse: Secondary | ICD-10-CM | POA: Insufficient documentation

## 2019-05-17 DIAGNOSIS — D649 Anemia, unspecified: Secondary | ICD-10-CM | POA: Insufficient documentation

## 2019-05-17 DIAGNOSIS — I10 Essential (primary) hypertension: Secondary | ICD-10-CM | POA: Diagnosis not present

## 2019-05-17 DIAGNOSIS — Z7982 Long term (current) use of aspirin: Secondary | ICD-10-CM | POA: Insufficient documentation

## 2019-05-17 DIAGNOSIS — Z87891 Personal history of nicotine dependence: Secondary | ICD-10-CM | POA: Insufficient documentation

## 2019-05-17 DIAGNOSIS — K449 Diaphragmatic hernia without obstruction or gangrene: Secondary | ICD-10-CM | POA: Insufficient documentation

## 2019-05-17 DIAGNOSIS — Z79899 Other long term (current) drug therapy: Secondary | ICD-10-CM | POA: Insufficient documentation

## 2019-05-17 DIAGNOSIS — M1710 Unilateral primary osteoarthritis, unspecified knee: Secondary | ICD-10-CM | POA: Diagnosis present

## 2019-05-17 DIAGNOSIS — K219 Gastro-esophageal reflux disease without esophagitis: Secondary | ICD-10-CM | POA: Insufficient documentation

## 2019-05-17 DIAGNOSIS — M179 Osteoarthritis of knee, unspecified: Secondary | ICD-10-CM | POA: Diagnosis present

## 2019-05-17 DIAGNOSIS — Z6835 Body mass index (BMI) 35.0-35.9, adult: Secondary | ICD-10-CM | POA: Insufficient documentation

## 2019-05-17 DIAGNOSIS — E669 Obesity, unspecified: Secondary | ICD-10-CM | POA: Insufficient documentation

## 2019-05-17 HISTORY — PX: TOTAL KNEE ARTHROPLASTY: SHX125

## 2019-05-17 LAB — TYPE AND SCREEN
ABO/RH(D): B POS
Antibody Screen: NEGATIVE

## 2019-05-17 SURGERY — ARTHROPLASTY, KNEE, TOTAL
Anesthesia: Monitor Anesthesia Care | Laterality: Right

## 2019-05-17 MED ORDER — EPHEDRINE 5 MG/ML INJ
INTRAVENOUS | Status: AC
  Filled 2019-05-17: qty 10

## 2019-05-17 MED ORDER — SODIUM CHLORIDE 0.9 % IR SOLN
Status: DC | PRN
  Administered 2019-05-17: 1000 mL

## 2019-05-17 MED ORDER — METHOCARBAMOL 500 MG IVPB - SIMPLE MED
INTRAVENOUS | Status: AC
  Filled 2019-05-17: qty 50

## 2019-05-17 MED ORDER — ONDANSETRON HCL 4 MG/2ML IJ SOLN: 4.0000 mg | Freq: Once | INTRAMUSCULAR | Status: DC | PRN

## 2019-05-17 MED ORDER — DEXAMETHASONE SODIUM PHOSPHATE 10 MG/ML IJ SOLN
10.0000 mg | Freq: Once | INTRAMUSCULAR | Status: AC
  Administered 2019-05-18: 10 mg via INTRAVENOUS
  Filled 2019-05-17: qty 1

## 2019-05-17 MED ORDER — FENTANYL CITRATE (PF) 100 MCG/2ML IJ SOLN
50.0000 ug | INTRAMUSCULAR | Status: DC
  Administered 2019-05-17: 50 ug via INTRAVENOUS
  Filled 2019-05-17: qty 2

## 2019-05-17 MED ORDER — PROPOFOL 10 MG/ML IV BOLUS
INTRAVENOUS | Status: DC | PRN
  Administered 2019-05-17: 170 mg via INTRAVENOUS
  Administered 2019-05-17: 30 mg via INTRAVENOUS

## 2019-05-17 MED ORDER — MORPHINE SULFATE (PF) 2 MG/ML IV SOLN: 1.0000 mg | INTRAVENOUS | Status: DC | PRN

## 2019-05-17 MED ORDER — MIDAZOLAM HCL 2 MG/2ML IJ SOLN
1.0000 mg | INTRAMUSCULAR | Status: DC
  Administered 2019-05-17: 1 mg via INTRAVENOUS
  Filled 2019-05-17: qty 2

## 2019-05-17 MED ORDER — SODIUM CHLORIDE (PF) 0.9 % IJ SOLN
INTRAMUSCULAR | Status: AC
  Filled 2019-05-17: qty 50

## 2019-05-17 MED ORDER — ACETAMINOPHEN 10 MG/ML IV SOLN
1000.0000 mg | Freq: Four times a day (QID) | INTRAVENOUS | Status: DC
  Administered 2019-05-17: 1000 mg via INTRAVENOUS
  Filled 2019-05-17: qty 100

## 2019-05-17 MED ORDER — BUPIVACAINE LIPOSOME 1.3 % IJ SUSP
INTRAMUSCULAR | Status: DC | PRN
  Administered 2019-05-17: 80 mL

## 2019-05-17 MED ORDER — ONDANSETRON HCL 4 MG PO TABS: 4.0000 mg | ORAL_TABLET | Freq: Four times a day (QID) | ORAL | Status: DC | PRN

## 2019-05-17 MED ORDER — LORATADINE 10 MG PO TABS
10.0000 mg | ORAL_TABLET | Freq: Every day | ORAL | Status: DC
  Administered 2019-05-18: 10 mg via ORAL
  Filled 2019-05-17: qty 1

## 2019-05-17 MED ORDER — OXYCODONE HCL 5 MG PO TABS: 5.0000 mg | ORAL_TABLET | Freq: Once | ORAL | Status: DC | PRN

## 2019-05-17 MED ORDER — MENTHOL 3 MG MT LOZG: 1.0000 | LOZENGE | OROMUCOSAL | Status: DC | PRN

## 2019-05-17 MED ORDER — CEFAZOLIN SODIUM-DEXTROSE 2-4 GM/100ML-% IV SOLN
2.0000 g | INTRAVENOUS | Status: AC
  Administered 2019-05-17: 2 g via INTRAVENOUS
  Filled 2019-05-17: qty 100

## 2019-05-17 MED ORDER — FUROSEMIDE 20 MG PO TABS
20.0000 mg | ORAL_TABLET | Freq: Every day | ORAL | Status: DC
  Administered 2019-05-18: 20 mg via ORAL
  Filled 2019-05-17: qty 1

## 2019-05-17 MED ORDER — ONDANSETRON HCL 4 MG/2ML IJ SOLN
INTRAMUSCULAR | Status: AC
  Filled 2019-05-17: qty 2

## 2019-05-17 MED ORDER — MOMETASONE FURO-FORMOTEROL FUM 200-5 MCG/ACT IN AERO
2.0000 | INHALATION_SPRAY | Freq: Two times a day (BID) | RESPIRATORY_TRACT | Status: DC
  Administered 2019-05-17 – 2019-05-18 (×2): 2 via RESPIRATORY_TRACT
  Filled 2019-05-17 (×2): qty 8.8

## 2019-05-17 MED ORDER — OXYCODONE HCL 5 MG PO TABS
10.0000 mg | ORAL_TABLET | ORAL | Status: DC | PRN
  Administered 2019-05-17 – 2019-05-18 (×2): 10 mg via ORAL
  Filled 2019-05-17 (×2): qty 2

## 2019-05-17 MED ORDER — METOCLOPRAMIDE HCL 5 MG/ML IJ SOLN: 5.0000 mg | Freq: Three times a day (TID) | INTRAMUSCULAR | Status: DC | PRN

## 2019-05-17 MED ORDER — DEXAMETHASONE SODIUM PHOSPHATE 10 MG/ML IJ SOLN
8.0000 mg | Freq: Once | INTRAMUSCULAR | Status: AC
  Administered 2019-05-17: 8 mg via INTRAVENOUS

## 2019-05-17 MED ORDER — GABAPENTIN 300 MG PO CAPS
300.0000 mg | ORAL_CAPSULE | Freq: Three times a day (TID) | ORAL | Status: DC
  Administered 2019-05-17 – 2019-05-18 (×3): 300 mg via ORAL
  Filled 2019-05-17 (×3): qty 1

## 2019-05-17 MED ORDER — EPHEDRINE SULFATE-NACL 50-0.9 MG/10ML-% IV SOSY
PREFILLED_SYRINGE | INTRAVENOUS | Status: DC | PRN
  Administered 2019-05-17: 10 mg via INTRAVENOUS
  Administered 2019-05-17: 5 mg via INTRAVENOUS
  Administered 2019-05-17: 10 mg via INTRAVENOUS

## 2019-05-17 MED ORDER — LACTATED RINGERS IV SOLN
INTRAVENOUS | Status: DC
  Administered 2019-05-17 (×2): via INTRAVENOUS

## 2019-05-17 MED ORDER — NIACIN ER (ANTIHYPERLIPIDEMIC) 500 MG PO TBCR
1000.0000 mg | EXTENDED_RELEASE_TABLET | Freq: Every day | ORAL | Status: DC
  Filled 2019-05-17 (×2): qty 2

## 2019-05-17 MED ORDER — ASPIRIN EC 325 MG PO TBEC
325.0000 mg | DELAYED_RELEASE_TABLET | Freq: Two times a day (BID) | ORAL | Status: DC
  Administered 2019-05-18: 325 mg via ORAL
  Filled 2019-05-17: qty 1

## 2019-05-17 MED ORDER — SODIUM CHLORIDE 0.9 % IV SOLN
INTRAVENOUS | Status: DC
  Administered 2019-05-17 (×2): via INTRAVENOUS

## 2019-05-17 MED ORDER — FENTANYL CITRATE (PF) 100 MCG/2ML IJ SOLN
INTRAMUSCULAR | Status: AC
  Filled 2019-05-17: qty 4

## 2019-05-17 MED ORDER — POTASSIUM CHLORIDE CRYS ER 20 MEQ PO TBCR
20.0000 meq | EXTENDED_RELEASE_TABLET | Freq: Every day | ORAL | Status: DC
  Administered 2019-05-18: 20 meq via ORAL
  Filled 2019-05-17: qty 1

## 2019-05-17 MED ORDER — FENTANYL CITRATE (PF) 100 MCG/2ML IJ SOLN
INTRAMUSCULAR | Status: AC
  Filled 2019-05-17: qty 2

## 2019-05-17 MED ORDER — METOCLOPRAMIDE HCL 5 MG PO TABS: 5.0000 mg | ORAL_TABLET | Freq: Three times a day (TID) | ORAL | Status: DC | PRN

## 2019-05-17 MED ORDER — BISACODYL 10 MG RE SUPP: 10.0000 mg | Freq: Every day | RECTAL | Status: DC | PRN

## 2019-05-17 MED ORDER — POLYETHYLENE GLYCOL 3350 17 G PO PACK: 17.0000 g | PACK | Freq: Every day | ORAL | Status: DC | PRN

## 2019-05-17 MED ORDER — SODIUM CHLORIDE (PF) 0.9 % IJ SOLN
INTRAMUSCULAR | Status: AC
  Filled 2019-05-17: qty 10

## 2019-05-17 MED ORDER — ACETAMINOPHEN 500 MG PO TABS
1000.0000 mg | ORAL_TABLET | Freq: Four times a day (QID) | ORAL | Status: DC
  Administered 2019-05-17 – 2019-05-18 (×2): 1000 mg via ORAL
  Filled 2019-05-17 (×3): qty 2

## 2019-05-17 MED ORDER — POVIDONE-IODINE 10 % EX SWAB
2.0000 "application " | Freq: Once | CUTANEOUS | Status: AC
  Administered 2019-05-17: 2 via TOPICAL

## 2019-05-17 MED ORDER — DOCUSATE SODIUM 100 MG PO CAPS
100.0000 mg | ORAL_CAPSULE | Freq: Two times a day (BID) | ORAL | Status: DC
  Administered 2019-05-17 – 2019-05-18 (×2): 100 mg via ORAL
  Filled 2019-05-17 (×2): qty 1

## 2019-05-17 MED ORDER — DOXAZOSIN MESYLATE 4 MG PO TABS
4.0000 mg | ORAL_TABLET | Freq: Every day | ORAL | Status: DC
  Administered 2019-05-18: 4 mg via ORAL
  Filled 2019-05-17: qty 1

## 2019-05-17 MED ORDER — FLEET ENEMA 7-19 GM/118ML RE ENEM: 1.0000 | ENEMA | Freq: Once | RECTAL | Status: DC | PRN

## 2019-05-17 MED ORDER — TRANEXAMIC ACID-NACL 1000-0.7 MG/100ML-% IV SOLN
1000.0000 mg | INTRAVENOUS | Status: AC
  Administered 2019-05-17: 1000 mg via INTRAVENOUS
  Filled 2019-05-17: qty 100

## 2019-05-17 MED ORDER — PHENOL 1.4 % MT LIQD: 1.0000 | OROMUCOSAL | Status: DC | PRN

## 2019-05-17 MED ORDER — PANTOPRAZOLE SODIUM 40 MG PO TBEC
40.0000 mg | DELAYED_RELEASE_TABLET | Freq: Every evening | ORAL | Status: DC
  Administered 2019-05-17: 40 mg via ORAL
  Filled 2019-05-17 (×2): qty 1

## 2019-05-17 MED ORDER — METHOCARBAMOL 500 MG PO TABS: 500.0000 mg | ORAL_TABLET | Freq: Four times a day (QID) | ORAL | Status: DC | PRN

## 2019-05-17 MED ORDER — OXYCODONE HCL 5 MG/5ML PO SOLN: 5.0000 mg | Freq: Once | ORAL | Status: DC | PRN

## 2019-05-17 MED ORDER — ONDANSETRON HCL 4 MG/2ML IJ SOLN
INTRAMUSCULAR | Status: DC | PRN
  Administered 2019-05-17: 4 mg via INTRAVENOUS

## 2019-05-17 MED ORDER — IRBESARTAN 150 MG PO TABS
300.0000 mg | ORAL_TABLET | Freq: Every evening | ORAL | Status: DC
  Administered 2019-05-17: 300 mg via ORAL
  Filled 2019-05-17: qty 2

## 2019-05-17 MED ORDER — PROPOFOL 10 MG/ML IV BOLUS
INTRAVENOUS | Status: AC
  Filled 2019-05-17: qty 80

## 2019-05-17 MED ORDER — BUPIVACAINE IN DEXTROSE 0.75-8.25 % IT SOLN
INTRATHECAL | Status: DC | PRN
  Administered 2019-05-17: 1.6 mL via INTRATHECAL

## 2019-05-17 MED ORDER — CEFAZOLIN SODIUM-DEXTROSE 2-4 GM/100ML-% IV SOLN
2.0000 g | Freq: Four times a day (QID) | INTRAVENOUS | Status: AC
  Administered 2019-05-17 (×2): 2 g via INTRAVENOUS
  Filled 2019-05-17 (×2): qty 100

## 2019-05-17 MED ORDER — DEXAMETHASONE SODIUM PHOSPHATE 10 MG/ML IJ SOLN
INTRAMUSCULAR | Status: AC
  Filled 2019-05-17: qty 1

## 2019-05-17 MED ORDER — BUPROPION HCL ER (XL) 150 MG PO TB24
150.0000 mg | ORAL_TABLET | Freq: Every day | ORAL | Status: DC
  Administered 2019-05-18: 150 mg via ORAL
  Filled 2019-05-17: qty 1

## 2019-05-17 MED ORDER — FENTANYL CITRATE (PF) 100 MCG/2ML IJ SOLN
25.0000 ug | INTRAMUSCULAR | Status: DC | PRN
  Administered 2019-05-17 (×5): 50 ug via INTRAVENOUS

## 2019-05-17 MED ORDER — ONDANSETRON HCL 4 MG/2ML IJ SOLN: 4.0000 mg | Freq: Four times a day (QID) | INTRAMUSCULAR | Status: DC | PRN

## 2019-05-17 MED ORDER — DIPHENHYDRAMINE HCL 12.5 MG/5ML PO ELIX: 12.5000 mg | ORAL_SOLUTION | ORAL | Status: DC | PRN

## 2019-05-17 MED ORDER — ALBUTEROL SULFATE (2.5 MG/3ML) 0.083% IN NEBU: 2.5000 mg | INHALATION_SOLUTION | Freq: Four times a day (QID) | RESPIRATORY_TRACT | Status: DC | PRN

## 2019-05-17 MED ORDER — CHLORHEXIDINE GLUCONATE 4 % EX LIQD: 60.0000 mL | Freq: Once | CUTANEOUS | Status: DC

## 2019-05-17 MED ORDER — METHOCARBAMOL 500 MG IVPB - SIMPLE MED
500.0000 mg | Freq: Four times a day (QID) | INTRAVENOUS | Status: DC | PRN
  Administered 2019-05-17: 500 mg via INTRAVENOUS
  Filled 2019-05-17: qty 50

## 2019-05-17 MED ORDER — OXYCODONE HCL 5 MG PO TABS
5.0000 mg | ORAL_TABLET | ORAL | Status: DC | PRN
  Administered 2019-05-17 (×2): 5 mg via ORAL
  Filled 2019-05-17 (×2): qty 1

## 2019-05-17 MED ORDER — SODIUM CHLORIDE (PF) 0.9 % IJ SOLN
INTRAMUSCULAR | Status: DC | PRN
  Administered 2019-05-17: 60 mL

## 2019-05-17 MED ORDER — TRAZODONE HCL 50 MG PO TABS
150.0000 mg | ORAL_TABLET | Freq: Every day | ORAL | Status: DC
  Administered 2019-05-17: 150 mg via ORAL
  Filled 2019-05-17: qty 1

## 2019-05-17 MED ORDER — PAROXETINE HCL 20 MG PO TABS
40.0000 mg | ORAL_TABLET | Freq: Every evening | ORAL | Status: DC
  Administered 2019-05-17: 40 mg via ORAL
  Filled 2019-05-17 (×2): qty 2

## 2019-05-17 MED ORDER — NITROGLYCERIN 0.4 MG SL SUBL: 0.4000 mg | SUBLINGUAL_TABLET | SUBLINGUAL | Status: DC | PRN

## 2019-05-17 SURGICAL SUPPLY — 58 items
ATTUNE PS FEM RT SZ 4 CEM KNEE (Femur) ×1 IMPLANT
ATTUNE PSRP INSR SZ4 12 KNEE (Insert) ×1 IMPLANT
BAG SPEC THK2 15X12 ZIP CLS (MISCELLANEOUS) ×1
BAG ZIPLOCK 12X15 (MISCELLANEOUS) ×2 IMPLANT
BASE TIBIAL ROT PLAT SZ 5 KNEE (Knees) IMPLANT
BLADE SAG 18X100X1.27 (BLADE) ×2 IMPLANT
BLADE SAW SGTL 11.0X1.19X90.0M (BLADE) ×2 IMPLANT
BLADE SURG SZ10 CARB STEEL (BLADE) ×4 IMPLANT
BNDG ELASTIC 6X5.8 VLCR STR LF (GAUZE/BANDAGES/DRESSINGS) ×2 IMPLANT
BOWL SMART MIX CTS (DISPOSABLE) ×2 IMPLANT
BSPLAT TIB 5 CMNT ROT PLAT STR (Knees) ×1 IMPLANT
CEMENT HV SMART SET (Cement) ×4 IMPLANT
COVER SURGICAL LIGHT HANDLE (MISCELLANEOUS) ×2 IMPLANT
COVER WAND RF STERILE (DRAPES) IMPLANT
CUFF TOURN SGL QUICK 34 (TOURNIQUET CUFF) ×2
CUFF TRNQT CYL 34X4.125X (TOURNIQUET CUFF) ×1 IMPLANT
DECANTER SPIKE VIAL GLASS SM (MISCELLANEOUS) ×2 IMPLANT
DRAPE U-SHAPE 47X51 STRL (DRAPES) ×2 IMPLANT
DRSG ADAPTIC 3X8 NADH LF (GAUZE/BANDAGES/DRESSINGS) ×2 IMPLANT
DRSG PAD ABDOMINAL 8X10 ST (GAUZE/BANDAGES/DRESSINGS) ×2 IMPLANT
DURAPREP 26ML APPLICATOR (WOUND CARE) ×2 IMPLANT
ELECT REM PT RETURN 15FT ADLT (MISCELLANEOUS) ×2 IMPLANT
EVACUATOR 1/8 PVC DRAIN (DRAIN) ×2 IMPLANT
GAUZE SPONGE 4X4 12PLY STRL (GAUZE/BANDAGES/DRESSINGS) ×2 IMPLANT
GLOVE BIO SURGEON STRL SZ7 (GLOVE) ×2 IMPLANT
GLOVE BIO SURGEON STRL SZ8 (GLOVE) ×2 IMPLANT
GLOVE BIOGEL PI IND STRL 6.5 (GLOVE) ×1 IMPLANT
GLOVE BIOGEL PI IND STRL 7.0 (GLOVE) ×1 IMPLANT
GLOVE BIOGEL PI IND STRL 8 (GLOVE) ×1 IMPLANT
GLOVE BIOGEL PI INDICATOR 6.5 (GLOVE) ×1
GLOVE BIOGEL PI INDICATOR 7.0 (GLOVE) ×1
GLOVE BIOGEL PI INDICATOR 8 (GLOVE) ×1
GLOVE SURG SS PI 6.5 STRL IVOR (GLOVE) ×2 IMPLANT
GOWN STRL REUS W/TWL LRG LVL3 (GOWN DISPOSABLE) ×6 IMPLANT
HANDPIECE INTERPULSE COAX TIP (DISPOSABLE) ×2
HOLDER FOLEY CATH W/STRAP (MISCELLANEOUS) IMPLANT
IMMOBILIZER KNEE 20 (SOFTGOODS) ×2
IMMOBILIZER KNEE 20 THIGH 36 (SOFTGOODS) ×1 IMPLANT
KIT TURNOVER KIT A (KITS) IMPLANT
MANIFOLD NEPTUNE II (INSTRUMENTS) ×2 IMPLANT
NS IRRIG 1000ML POUR BTL (IV SOLUTION) ×2 IMPLANT
PACK TOTAL KNEE CUSTOM (KITS) ×2 IMPLANT
PADDING CAST COTTON 6X4 STRL (CAST SUPPLIES) ×6 IMPLANT
PATELLA MEDIAL ATTUN 35MM KNEE (Knees) ×1 IMPLANT
PIN STEINMAN FIXATION KNEE (PIN) ×1 IMPLANT
PROTECTOR NERVE ULNAR (MISCELLANEOUS) ×2 IMPLANT
SET HNDPC FAN SPRY TIP SCT (DISPOSABLE) ×1 IMPLANT
STRIP CLOSURE SKIN 1/2X4 (GAUZE/BANDAGES/DRESSINGS) ×4 IMPLANT
SUT MNCRL AB 4-0 PS2 18 (SUTURE) ×2 IMPLANT
SUT STRATAFIX 0 PDS 27 VIOLET (SUTURE) ×2
SUT VIC AB 2-0 CT1 27 (SUTURE) ×6
SUT VIC AB 2-0 CT1 TAPERPNT 27 (SUTURE) ×3 IMPLANT
SUTURE STRATFX 0 PDS 27 VIOLET (SUTURE) ×1 IMPLANT
TIBIAL BASE ROT PLAT SZ 5 KNEE (Knees) ×2 IMPLANT
TRAY FOLEY MTR SLVR 16FR STAT (SET/KITS/TRAYS/PACK) ×2 IMPLANT
WATER STERILE IRR 1000ML POUR (IV SOLUTION) ×4 IMPLANT
WRAP KNEE MAXI GEL POST OP (GAUZE/BANDAGES/DRESSINGS) ×2 IMPLANT
YANKAUER SUCT BULB TIP 10FT TU (MISCELLANEOUS) ×2 IMPLANT

## 2019-05-17 NOTE — Anesthesia Procedure Notes (Signed)
Spinal  Patient location during procedure: OR Start time: 05/17/2019 10:30 AM End time: 05/17/2019 10:35 AM Staffing Resident/CRNA: Niel Hummer, CRNA Performed: resident/CRNA  Preanesthetic Checklist Completed: patient identified, surgical consent, pre-op evaluation, IV checked, risks and benefits discussed and monitors and equipment checked Spinal Block Patient position: sitting Prep: DuraPrep Patient monitoring: blood pressure, continuous pulse ox and heart rate Approach: midline Location: L3-4 Injection technique: single-shot Needle Needle type: Pencan  Needle gauge: 24 G Needle length: 5 cm

## 2019-05-17 NOTE — Discharge Instructions (Signed)
° °Dr. Frank Aluisio °Total Joint Specialist °[facility] °[address] °[place], [place] [address] °[phone] ° °TOTAL KNEE REPLACEMENT POSTOPERATIVE DIRECTIONS ° °Knee Rehabilitation, Guidelines Following Surgery  °Results after knee surgery are often greatly improved when you follow the exercise, range of motion and muscle strengthening exercises prescribed by your doctor. Safety measures are also important to protect the knee from further injury. Any time any of these exercises cause you to have increased pain or swelling in your knee joint, decrease the amount until you are comfortable again and slowly increase them. If you have problems or questions, call your caregiver or physical therapist for advice.  ° °HOME CARE INSTRUCTIONS  °• Remove items at home which could result in a fall. This includes throw rugs or furniture in walking pathways.  °· ICE to the affected knee every three hours for 30 minutes at a time and then as needed for pain and swelling.  Continue to use ice on the knee for pain and swelling from surgery. You may notice swelling that will progress down to the foot and ankle.  This is normal after surgery.  Elevate the leg when you are not up walking on it.   °· Continue to use the breathing machine which will help keep your temperature down.  It is common for your temperature to cycle up and down following surgery, especially at night when you are not up moving around and exerting yourself.  The breathing machine keeps your lungs expanded and your temperature down. °· Do not place pillow under knee, focus on keeping the knee straight while resting ° °DIET °You may resume your previous home diet once your are discharged from the hospital. ° °DRESSING / WOUND CARE / SHOWERING °You may shower 3 days after surgery, but keep the wounds dry during showering.  You may use an occlusive plastic wrap (Press'n Seal for example), NO SOAKING/SUBMERGING IN THE BATHTUB.  If the bandage  gets wet, change with a clean dry gauze.  If the incision gets wet, pat the wound dry with a clean towel. °You may start showering once you are discharged home but do not submerge the incision under water. Just pat the incision dry and apply a dry gauze dressing on daily. °Change the surgical dressing daily and reapply a dry dressing each time. ° °ACTIVITY °Walk with your walker as instructed. °Use walker as long as suggested by your caregivers. °Avoid periods of inactivity such as sitting longer than an hour when not asleep. This helps prevent blood clots.  °You may resume a sexual relationship in one month or when given the OK by your doctor.  °You may return to work once you are cleared by your doctor.  °Do not drive a car for 6 weeks or until released by you surgeon.  °Do not drive while taking narcotics. ° °WEIGHT BEARING °Weight bearing as tolerated with assist device (walker, cane, etc) as directed, use it as long as suggested by your surgeon or therapist, typically at least 4-6 weeks. ° °POSTOPERATIVE CONSTIPATION PROTOCOL °Constipation - defined medically as fewer than three stools per week and severe constipation as less than one stool per week. ° °One of the most common issues patients have following surgery is constipation.  Even if you have a regular bowel pattern at home, your normal regimen is likely to be disrupted due to multiple reasons following surgery.  Combination of anesthesia, postoperative narcotics, change in appetite and fluid intake all can affect your bowels.    In order to avoid complications following surgery, here are some recommendations in order to help you during your recovery period. ° °Colace (docusate) - Pick up an over-the-counter form of Colace or another stool softener and take twice a day as long as you are requiring postoperative pain medications.  Take with a full glass of water daily.  If you experience loose stools or diarrhea, hold the colace until you stool forms back  up.  If your symptoms do not get better within 1 week or if they get worse, check with your doctor. ° °Dulcolax (bisacodyl) - Pick up over-the-counter and take as directed by the product packaging as needed to assist with the movement of your bowels.  Take with a full glass of water.  Use this product as needed if not relieved by Colace only.  ° °MiraLax (polyethylene glycol) - Pick up over-the-counter to have on hand.  MiraLax is a solution that will increase the amount of water in your bowels to assist with bowel movements.  Take as directed and can mix with a glass of water, juice, soda, coffee, or tea.  Take if you go more than two days without a movement. °Do not use MiraLax more than once per day. Call your doctor if you are still constipated or irregular after using this medication for 7 days in a row. ° °If you continue to have problems with postoperative constipation, please contact the office for further assistance and recommendations.  If you experience "the worst abdominal pain ever" or develop nausea or vomiting, please contact the office immediatly for further recommendations for treatment. ° °ITCHING ° If you experience itching with your medications, try taking only a single pain pill, or even half a pain pill at a time.  You can also use Benadryl over the counter for itching or also to help with sleep.  ° °TED HOSE STOCKINGS °Wear the elastic stockings on both legs for three weeks following surgery during the day but you may remove then at night for sleeping. ° °MEDICATIONS °See your medication summary on the “After Visit Summary” that the nursing staff will review with you prior to discharge.  You may have some home medications which will be placed on hold until you complete the course of blood thinner medication.  It is important for you to complete the blood thinner medication as prescribed by your surgeon.  Continue your approved medications as instructed at time of discharge. ° °PRECAUTIONS °If  you experience chest pain or shortness of breath - call 911 immediately for transfer to the hospital emergency department.  °If you develop a fever greater that 101 F, purulent drainage from wound, increased redness or drainage from wound, foul odor from the wound/dressing, or calf pain - CONTACT YOUR SURGEON.   °                                                °FOLLOW-UP APPOINTMENTS °Make sure you keep all of your appointments after your operation with your surgeon and caregivers. You should call the office at the above phone number and make an appointment for approximately two weeks after the date of your surgery or on the date instructed by your surgeon outlined in the "After Visit Summary". ° ° °RANGE OF MOTION AND STRENGTHENING EXERCISES  °Rehabilitation of the knee is important following a knee injury or   an operation. After just a few days of immobilization, the muscles of the thigh which control the knee become weakened and shrink (atrophy). Knee exercises are designed to build up the tone and strength of the thigh muscles and to improve knee motion. Often times heat used for twenty to thirty minutes before working out will loosen up your tissues and help with improving the range of motion but do not use heat for the first two weeks following surgery. These exercises can be done on a training (exercise) mat, on the floor, on a table or on a bed. Use what ever works the best and is most comfortable for you Knee exercises include:  °• Leg Lifts - While your knee is still immobilized in a splint or cast, you can do straight leg raises. Lift the leg to 60 degrees, hold for 3 sec, and slowly lower the leg. Repeat 10-20 times 2-3 times daily. Perform this exercise against resistance later as your knee gets better.  °• Quad and Hamstring Sets - Tighten up the muscle on the front of the thigh (Quad) and hold for 5-10 sec. Repeat this 10-20 times hourly. Hamstring sets are done by pushing the foot backward against an  object and holding for 5-10 sec. Repeat as with quad sets.  °· Leg Slides: Lying on your back, slowly slide your foot toward your buttocks, bending your knee up off the floor (only go as far as is comfortable). Then slowly slide your foot back down until your leg is flat on the floor again. °· Angel Wings: Lying on your back spread your legs to the side as far apart as you can without causing discomfort.  °A rehabilitation program following serious knee injuries can speed recovery and prevent re-injury in the future due to weakened muscles. Contact your doctor or a physical therapist for more information on knee rehabilitation.  ° °IF YOU ARE TRANSFERRED TO A SKILLED REHAB FACILITY °If the patient is transferred to a skilled rehab facility following release from the hospital, a list of the current medications will be sent to the facility for the patient to continue.  When discharged from the skilled rehab facility, please have the facility set up the patient's Home Health Physical Therapy prior to being released. Also, the skilled facility will be responsible for providing the patient with their medications at time of release from the facility to include their pain medication, the muscle relaxants, and their blood thinner medication. If the patient is still at the rehab facility at time of the two week follow up appointment, the skilled rehab facility will also need to assist the patient in arranging follow up appointment in our office and any transportation needs. ° °MAKE SURE YOU:  °• Understand these instructions.  °• Get help right away if you are not doing well or get worse.  ° ° °Pick up stool softner and laxative for home use following surgery while on pain medications. °Do not submerge incision under water. °Please use good hand washing techniques while changing dressing each day. °May shower starting three days after surgery. °Please use a clean towel to pat the incision dry following showers. °Continue to  use ice for pain and swelling after surgery. °Do not use any lotions or creams on the incision until instructed by your surgeon. ° °

## 2019-05-17 NOTE — Plan of Care (Signed)
Plan of care 

## 2019-05-17 NOTE — Evaluation (Signed)
Physical Therapy Evaluation Patient Details Name: Shelley Spencer MRN: [id number] DOB: 03-16-1946 Today's Date: 05/17/2019   History of Present Illness  73 yo female s/p R TKA. PMH:  ACDF, obesity  Clinical Impression  Pt is s/p TKA resulting in the deficits listed below (see PT Problem List).  Pt amb ~ 18' with RW and min/guard assist, anticipate steady progress  Pt will benefit from skilled PT to increase their independence and safety with mobility to allow discharge to the venue listed below.      Follow Up Recommendations Follow surgeon's recommendation for DC plan and follow-up therapies    Equipment Recommendations  Rolling walker with 5" wheels;3in1 (PT)    Recommendations for Other Services       Precautions / Restrictions Precautions Precautions: Knee Required Braces or Orthoses: Knee Immobilizer - Right Knee Immobilizer - Right: Discontinue once straight leg raise with < 10 degree lag Restrictions Weight Bearing Restrictions: No Other Position/Activity Restrictions: WBAT      Mobility  Bed Mobility Overal bed mobility: Needs Assistance Bed Mobility: Supine to Sit     Supine to sit: Min guard     General bed mobility comments: for safety  Transfers Overall transfer level: Needs assistance Equipment used: Rolling walker (2 wheeled) Transfers: Sit to/from Stand Sit to Stand: Min guard;Min assist         General transfer comment: assist with RLE  Ambulation/Gait Ambulation/Gait assistance: Min guard Gait Distance (Feet): 50 Feet Assistive device: Rolling walker (2 wheeled) Gait Pattern/deviations: Step-to pattern;Decreased weight shift to right     General Gait Details: cues for sequence and RW safety, especially with turns  Science writer    Modified Rankin (Stroke Patients Only)       Balance                                             Pertinent Vitals/Pain Pain Assessment: 0-10 Pain  Score: 4  Pain Location: right knee Pain Descriptors / Indicators: Aching;Grimacing;Sore Pain Intervention(s): Limited activity within patient's tolerance;Monitored during session;Repositioned;Premedicated before session    [place] expects to be discharged to:: Private residence Living Arrangements: Spouse/significant other   Type of Home: House Home Access: Level entry     Home Layout: Two level;Able to live on main level with bedroom/bathroom Home Equipment: None      Prior Function Level of Independence: Independent         Comments: pt lives with 49yo husband that has had heart transplant and amb with a walker. son also currently living with them (he is a recovering alcoholic, [id number] sober, no driver's license)     Journalist, newspaper        Extremity/Trunk Assessment   Upper Extremity Assessment Upper Extremity Assessment: Overall WFL for tasks assessed    Lower Extremity Assessment Lower Extremity Assessment: RLE deficits/detail RLE Deficits / Details: ankle WFL, knee and hip grossly 3/5; knee AROM 10 to 55 degrees flexion       Communication   Communication: No difficulties  Cognition Arousal/Alertness: Awake/alert Behavior During Therapy: WFL for tasks assessed/performed Overall Cognitive Status: Within Functional Limits for tasks assessed  General Comments      Exercises Total Joint Exercises Ankle Circles/Pumps: AROM;Both;10 reps Quad Sets: 5 reps;Both;AROM   Assessment/Plan    PT Assessment Patient needs continued PT services  PT Problem List Decreased strength;Decreased range of motion;Pain;Decreased knowledge of use of DME;Decreased mobility       PT Treatment Interventions DME instruction;Gait training;Functional mobility training;Therapeutic activities;Patient/family education;Therapeutic exercise    PT Goals (Current goals can be found in the Care Plan section)  Acute  Rehab PT Goals PT Goal Formulation: With patient Time For Goal Achievement: 05/24/19 Potential to Achieve Goals: Good    Frequency 7X/week   Barriers to discharge        Co-evaluation               AM-PAC PT "6 Clicks" Mobility  Outcome Measure Help needed turning from your back to your side while in a flat bed without using bedrails?: A Little Help needed moving from lying on your back to sitting on the side of a flat bed without using bedrails?: A Little Help needed moving to and from a bed to a chair (including a wheelchair)?: A Little Help needed standing up from a chair using your arms (e.g., wheelchair or bedside chair)?: A Little Help needed to walk in hospital room?: A Little Help needed climbing 3-5 steps with a railing? : A Lot 6 Click Score: 17    End of Session Equipment Utilized During Treatment: Gait belt Activity Tolerance: Patient tolerated treatment well Patient left: in chair;with call bell/phone within reach;with chair alarm set   PT Visit Diagnosis: Difficulty in walking, not elsewhere classified (R26.2)    Time: [id number] PT Time Calculation (min) (ACUTE ONLY): 25 min   Charges:     PT Treatments $Gait Training: 8-22 mins        Kenyon Ana, PT  Pager: [phone] Acute Rehab Dept [facility]): [phone]   05/17/2019   [facility] 05/17/2019, 6:52 PM

## 2019-05-17 NOTE — Progress Notes (Signed)
AssistedDr. Joslin with right, ultrasound guided, adductor canal block. Side rails up, monitors on throughout procedure. See vital signs in flow sheet. Tolerated Procedure well.  

## 2019-05-17 NOTE — Anesthesia Procedure Notes (Signed)
Procedure Name: LMA Insertion Date/Time: 05/17/2019 10:50 AM Performed by: Niel Hummer, CRNA Pre-anesthesia Checklist: Patient identified, Emergency Drugs available, Suction available and Patient being monitored Patient Re-evaluated:Patient Re-evaluated prior to induction Oxygen Delivery Method: Circle system utilized Preoxygenation: Pre-oxygenation with 100% oxygen Induction Type: IV induction LMA: LMA inserted LMA Size: 4.0 Dental Injury: Teeth and Oropharynx as per pre-operative assessment

## 2019-05-17 NOTE — Transfer of Care (Signed)
Immediate Anesthesia Transfer of Care Note  Patient: HAELY HARTNESS  Procedure(s) Performed: TOTAL KNEE ARTHROPLASTY (Right )  Patient Location: PACU  Anesthesia Type:General  Level of Consciousness: awake, alert  and oriented  Airway & Oxygen Therapy: Patient Spontanous Breathing and Patient connected to face mask oxygen  Post-op Assessment: Report given to RN and Post -op Vital signs reviewed and stable  Post vital signs: Reviewed and stable  Last Vitals:  Vitals Value Taken Time  BP 163/99 05/17/19 1154  Temp    Pulse 86 05/17/19 1156  Resp 14 05/17/19 1156  SpO2 100 % 05/17/19 1156  Vitals shown include unvalidated device data.  Last Pain:  Vitals:   05/17/19 0827  TempSrc: Oral  PainSc:       Patients Stated Pain Goal: 3 ([id number])  Complications: No apparent anesthesia complications

## 2019-05-17 NOTE — Anesthesia Procedure Notes (Signed)
Procedure Name: MAC Date/Time: 05/17/2019 10:25 AM Performed by: Niel Hummer, CRNA Pre-anesthesia Checklist: Patient identified, Emergency Drugs available, Suction available and Patient being monitored Patient Re-evaluated:Patient Re-evaluated prior to induction Oxygen Delivery Method: Simple face mask

## 2019-05-17 NOTE — Interval H&P Note (Signed)
History and Physical Interval Note:  05/17/2019 8:21 AM  Shelley Spencer  has presented today for surgery, with the diagnosis of right knee osteoarthritis.  The various methods of treatment have been discussed with the patient and family. After consideration of risks, benefits and other options for treatment, the patient has consented to  Procedure(s) with comments: TOTAL KNEE ARTHROPLASTY (Right) - 66min as a surgical intervention.  The patient's history has been reviewed, patient examined, no change in status, stable for surgery.  I have reviewed the patient's chart and labs.  Questions were answered to the patient's satisfaction.     Pilar Plate Marlet Korte

## 2019-05-17 NOTE — Op Note (Signed)
OPERATIVE REPORT-TOTAL KNEE ARTHROPLASTY   Pre-operative diagnosis- Osteoarthritis  Right knee(s)  Post-operative diagnosis- Osteoarthritis Right knee(s)  Procedure-  Right  Total Knee Arthroplasty  Surgeon- Dione Plover. Burtis Imhoff, MD  Assistant- Ardeen Jourdain, PA-C   Anesthesia-  GA combined with regional for post-op pain  EBL-25 mL   Drains Hemovac  Tourniquet time-  Total Tourniquet Time Documented: Thigh (Right) - 33 minutes Total: Thigh (Right) - 33 minutes     Complications- None  Condition-PACU - hemodynamically stable.   Brief Clinical Note  Shelley Spencer is a 73 y.o. year old female with end stage OA of her right knee with progressively worsening pain and dysfunction. She has constant pain, with activity and at rest and significant functional deficits with difficulties even with ADLs. She has had extensive non-op management including analgesics, injections of cortisone and viscosupplements, and home exercise program, but remains in significant pain with significant dysfunction.Radiographs show bone on bone arthritis lateral and patellofemoral. She presents now for right Total Knee Arthroplasty.    Procedure in detail---   The patient is brought into the operating room and positioned supine on the operating table. After successful administration of  GA combined with regional for post-op pain,   a tourniquet is placed high on the  Right thigh(s) and the lower extremity is prepped and draped in the usual sterile fashion. Time out is performed by the operating team and then the  Right lower extremity is wrapped in Esmarch, knee flexed and the tourniquet inflated to 300 mmHg.       A midline incision is made with a ten blade through the subcutaneous tissue to the level of the extensor mechanism. A fresh blade is used to make a medial parapatellar arthrotomy. Soft tissue over the proximal medial tibia is subperiosteally elevated to the joint line with a knife and into the  semimembranosus bursa with a Cobb elevator. Soft tissue over the proximal lateral tibia is elevated with attention being paid to avoiding the patellar tendon on the tibial tubercle. The patella is everted, knee flexed 90 degrees and the ACL and PCL are removed. Findings are bone on bone lateral and patellofemoral with large global osteophytes.        The drill is used to create a starting hole in the distal femur and the canal is thoroughly irrigated with sterile saline to remove the fatty contents. The 5 degree Right  valgus alignment guide is placed into the femoral canal and the distal femoral cutting block is pinned to remove 9 mm off the distal femur. Resection is made with an oscillating saw.      The tibia is subluxed forward and the menisci are removed. The extramedullary alignment guide is placed referencing proximally at the medial aspect of the tibial tubercle and distally along the second metatarsal axis and tibial crest. The block is pinned to remove 91mm off the more deficient lateral  side. Resection is made with an oscillating saw. Size 5is the most appropriate size for the tibia and the proximal tibia is prepared with the modular drill and keel punch for that size.      The femoral sizing guide is placed and size 4 is most appropriate. Rotation is marked off the epicondylar axis and confirmed by creating a rectangular flexion gap at 90 degrees. The size 4 cutting block is pinned in this rotation and the anterior, posterior and chamfer cuts are made with the oscillating saw. The intercondylar block is then placed and that cut  is made.      Trial size 5 tibial component, trial size 4 posterior stabilized femur and a 12  mm posterior stabilized rotating platform insert trial is placed. Full extension is achieved with excellent varus/valgus and anterior/posterior balance throughout full range of motion. The patella is everted and thickness measured to be 22  mm. Free hand resection is taken to 12 mm,  a 35 template is placed, lug holes are drilled, trial patella is placed, and it tracks normally. Osteophytes are removed off the posterior femur with the trial in place. All trials are removed and the cut bone surfaces prepared with pulsatile lavage. Cement is mixed and once ready for implantation, the size 5 tibial implant, size  4 posterior stabilized femoral component, and the size 35 patella are cemented in place and the patella is held with the clamp. The trial insert is placed and the knee held in full extension. The Exparel (20 ml mixed with 60 ml saline) is injected into the extensor mechanism, posterior capsule, medial and lateral gutters and subcutaneous tissues.  All extruded cement is removed and once the cement is hard the permanent 12 mm posterior stabilized rotating platform insert is placed into the tibial tray.      The wound is copiously irrigated with saline solution and the extensor mechanism closed over a hemovac drain with #1 V-loc suture. The tourniquet is released for a total tourniquet time of 33  minutes. Flexion against gravity is 140 degrees and the patella tracks normally. Subcutaneous tissue is closed with 2.0 vicryl and subcuticular with running 4.0 Monocryl. The incision is cleaned and dried and steri-strips and a bulky sterile dressing are applied. The limb is placed into a knee immobilizer and the patient is awakened and transported to recovery in stable condition.      Please note that a surgical assistant was a medical necessity for this procedure in order to perform it in a safe and expeditious manner. Surgical assistant was necessary to retract the ligaments and vital neurovascular structures to prevent injury to them and also necessary for proper positioning of the limb to allow for anatomic placement of the prosthesis.   Dione Plover Anniston Nellums, MD    05/17/2019, 11:48 AM

## 2019-05-17 NOTE — Anesthesia Postprocedure Evaluation (Signed)
Anesthesia Post Note  Patient: Shelley Spencer  Procedure(s) Performed: TOTAL KNEE ARTHROPLASTY (Right )     Patient location during evaluation: PACU Anesthesia Type: MAC Level of consciousness: oriented and awake and alert Pain management: pain level controlled Vital Signs Assessment: post-procedure vital signs reviewed and stable Respiratory status: spontaneous breathing, respiratory function stable and patient connected to nasal cannula oxygen Cardiovascular status: blood pressure returned to baseline and stable Postop Assessment: no headache, no backache and no apparent nausea or vomiting Anesthetic complications: no    Last Vitals:  Vitals:   05/17/19 1710 05/17/19 1819  BP: (!) 149/85 (!) 141/73  Pulse: 92 88  Resp: 16 16  Temp: 36.5 C 36.5 C  SpO2: 97% 95%    Last Pain:  Vitals:   05/17/19 1950  TempSrc:   PainSc: 4                  Kearie Mennen COKER

## 2019-05-18 ENCOUNTER — Encounter (HOSPITAL_COMMUNITY): Payer: Self-pay | Admitting: Orthopedic Surgery

## 2019-05-18 DIAGNOSIS — M1711 Unilateral primary osteoarthritis, right knee: Secondary | ICD-10-CM | POA: Diagnosis not present

## 2019-05-18 LAB — CBC
HCT: 34.4 % — ABNORMAL LOW (ref 36.0–46.0)
Hemoglobin: 10.5 g/dL — ABNORMAL LOW (ref 12.0–15.0)
MCH: 26.3 pg (ref 26.0–34.0)
MCHC: 30.5 g/dL (ref 30.0–36.0)
MCV: 86 fL (ref 80.0–100.0)
Platelets: 217 10*3/uL (ref 150–400)
RBC: 4 MIL/uL (ref 3.87–5.11)
RDW: 14.4 % (ref 11.5–15.5)
WBC: 10.2 10*3/uL (ref 4.0–10.5)
nRBC: 0 % (ref 0.0–0.2)

## 2019-05-18 LAB — BASIC METABOLIC PANEL
Anion gap: 6 (ref 5–15)
BUN: 17 mg/dL (ref 8–23)
CO2: 24 mmol/L (ref 22–32)
Calcium: 8.6 mg/dL — ABNORMAL LOW (ref 8.9–10.3)
Chloride: 108 mmol/L (ref 98–111)
Creatinine, Ser: 1.08 mg/dL — ABNORMAL HIGH (ref 0.44–1.00)
GFR calc Af Amer: 60 mL/min — ABNORMAL LOW (ref >60)
GFR calc non Af Amer: 52 mL/min — ABNORMAL LOW (ref >60)
Glucose, Bld: 164 mg/dL — ABNORMAL HIGH (ref 70–99)
Potassium: 4.5 mmol/L (ref 3.5–5.1)
Sodium: 138 mmol/L (ref 135–145)

## 2019-05-18 MED ORDER — ASPIRIN 325 MG PO TBEC: 325.0000 mg | DELAYED_RELEASE_TABLET | Freq: Two times a day (BID) | ORAL | 0 refills | Status: AC

## 2019-05-18 MED ORDER — GABAPENTIN 300 MG PO CAPS: 300.0000 mg | ORAL_CAPSULE | Freq: Three times a day (TID) | ORAL | 0 refills | Status: DC

## 2019-05-18 MED ORDER — OXYCODONE HCL 5 MG PO TABS: 5.0000 mg | ORAL_TABLET | Freq: Four times a day (QID) | ORAL | 0 refills | Status: DC | PRN

## 2019-05-18 MED ORDER — METHOCARBAMOL 500 MG PO TABS: 500.0000 mg | ORAL_TABLET | Freq: Four times a day (QID) | ORAL | 0 refills | Status: DC | PRN

## 2019-05-18 NOTE — Progress Notes (Signed)
Subjective: 1 Day Post-Op Procedure(s) (LRB): TOTAL KNEE ARTHROPLASTY (Right) Patient reports pain as mild.   Patient seen in rounds by Dr. Wynelle Link. Patient is well, and has had no acute complaints or problems other than discomfort in the right knee. No acute events overnight. Foley catheter removed, positive flatus. Patient states she is ready to go home today. We will continue therapy today.   Objective: Vital signs in last 24 hours: Temp:  [97.6 F (36.4 C)-98.7 F (37.1 C)] 98.2 F (36.8 C) (08/25 0622) Pulse Rate:  [65-92] 72 (08/25 0622) Resp:  [8-22] 17 (08/25 0622) BP: (94-166)/(70-91) 152/70 (08/25 0622) SpO2:  [90 %-100 %] 90 % (08/25 0622) Weight:  [88.4 kg-89 kg] 89 kg (08/24 1501)  Intake/Output from previous day:  Intake/Output Summary (Last 24 hours) at 05/18/2019 0708 Last data filed at 05/18/2019 0553 Gross per 24 hour  Intake 2940 ml  Output 3135 ml  Net -195 ml     Intake/Output this shift: No intake/output data recorded.  Labs: Recent Labs    05/18/19 0310  HGB 10.5*   Recent Labs    05/18/19 0310  WBC 10.2  RBC 4.00  HCT 34.4*  PLT 217   Recent Labs    05/18/19 0310  NA 138  K 4.5  CL 108  CO2 24  BUN 17  CREATININE 1.08*  GLUCOSE 164*  CALCIUM 8.6*   No results for input(s): LABPT, INR in the last 72 hours.  Exam: General - Patient is Alert and Oriented Extremity - Neurologically intact Sensation intact distally Intact pulses distally Dorsiflexion/Plantar flexion intact Dressing - dressing C/D/I Motor Function - intact, moving foot and toes well on exam.   Past Medical History:  Diagnosis Date  . Anemia   . Anxiety   . Arthritis   . Asthma   . Bicornuate uterus    double cervix  . Colon polyp   . Depression   . Deviated septum   . DJD (degenerative joint disease)    L-Spine  . Fibroids   . GERD (gastroesophageal reflux disease)   . Hematuria   . Hiatal hernia   . HNP (herniated nucleus pulposus)    lower  back  . Hyperlipidemia   . Hypertension   . MVP (mitral valve prolapse)    pt denies, pt states she had a Echo ?2008 and it did not show MVP  . Pre-diabetes   . Retinal tear 2005   Small right retinal tear  . Shingles 1980's   on her waist    Assessment/Plan: 1 Day Post-Op Procedure(s) (LRB): TOTAL KNEE ARTHROPLASTY (Right) Principal Problem:   OA (osteoarthritis) of knee  Estimated body mass index is 35.89 kg/m as calculated from the following:   Height as of this encounter: 5\' 2"  (1.575 m).   Weight as of this encounter: 89 kg. Advance diet Up with therapy D/C IV fluids   Patient's anticipated LOS is less than 2 midnights, meeting these requirements: - Lives within 1 hour of care - Has a competent adult at home to recover with post-op recover - NO history of  - Chronic pain requiring opiods  - Diabetes  - Coronary Artery Disease  - Heart failure  - Heart attack  - Stroke  - DVT/VTE  - Cardiac arrhythmia  - Respiratory Failure/COPD  - Renal failure  - Anemia  - Advanced Liver disease  DVT Prophylaxis - Aspirin Weight bearing as tolerated. D/C O2 and pulse ox and try on room air. Hemovac  pulled without difficulty, will begin therapy today.  Plan is to go Home after hospital stay. Plan for discharge today following 1-2  Sessions of PT as long as she is meeting her goals. Scheduled for OPPT at [facility]. Follow up in the office in 2 weeks.   Griffith Citron, PA-C Orthopedic Surgery 05/18/2019, 7:08 AM

## 2019-05-18 NOTE — Plan of Care (Signed)
Patient being discharged

## 2019-05-18 NOTE — Progress Notes (Signed)
Physical Therapy Treatment Patient Details Name: Shelley Spencer MRN: [id number] DOB: 04-Aug-1946 Today's Date: 05/18/2019    History of Present Illness 73 yo female s/p R TKA. PMH:  ACDF, obesity    PT Comments    Pt progressing well. Reviewed HEP and gait/home safety. Pt is ready for d/c from PT standpoint   Follow Up Recommendations  Follow surgeon's recommendation for DC plan and follow-up therapies     Equipment Recommendations  Rolling walker with 5" wheels;3in1 (PT)    Recommendations for Other Services       Precautions / Restrictions Precautions Precautions: Knee Precaution Comments: IND SLRs Required Braces or Orthoses: Knee Immobilizer - Right Knee Immobilizer - Right: Discontinue once straight leg raise with < 10 degree lag Restrictions Weight Bearing Restrictions: No Other Position/Activity Restrictions: WBAT    Mobility  Bed Mobility Overal bed mobility: Modified Independent Bed Mobility: Supine to Sit              Transfers Overall transfer level: Needs assistance Equipment used: Rolling walker (2 wheeled) Transfers: Sit to/from Stand Sit to Stand: Supervision         General transfer comment: cues for hand placement   Ambulation/Gait Ambulation/Gait assistance: Supervision Gait Distance (Feet): 110 Feet Assistive device: Rolling walker (2 wheeled) Gait Pattern/deviations: Step-to pattern;Decreased weight shift to right     General Gait Details: cues for sequence    Stairs             Wheelchair Mobility    Modified Rankin (Stroke Patients Only)       Balance                                            Cognition Arousal/Alertness: Awake/alert Behavior During Therapy: WFL for tasks assessed/performed Overall Cognitive Status: Within Functional Limits for tasks assessed                                        Exercises Total Joint Exercises Ankle Circles/Pumps: AROM;Both;10 reps Quad  Sets: AROM;Strengthening;Both;10 reps Short Arc Quad: AROM;Right;10 reps Heel Slides: AAROM;Right;10 reps Hip ABduction/ADduction: AROM;Strengthening;Right;10 reps Goniometric ROM: grossly 5 to 70 degrees AAROM right knee flexion    General Comments        Pertinent Vitals/Pain Pain Assessment: 0-10 Pain Score: 2  Pain Location: right knee Pain Descriptors / Indicators: Sore Pain Intervention(s): Limited activity within patient's tolerance;Monitored during session;Repositioned;Ice applied    Home Living                      Prior Function            PT Goals (current goals can now be found in the care plan section) Acute Rehab PT Goals PT Goal Formulation: With patient Time For Goal Achievement: 05/24/19 Potential to Achieve Goals: Good Progress towards PT goals: Progressing toward goals    Frequency    7X/week      PT Plan Current plan remains appropriate    Co-evaluation              AM-PAC PT "6 Clicks" Mobility   Outcome Measure  Help needed turning from your back to your side while in a flat bed without using bedrails?: A Little Help needed moving from lying on your  back to sitting on the side of a flat bed without using bedrails?: None Help needed moving to and from a bed to a chair (including a wheelchair)?: A Little Help needed standing up from a chair using your arms (e.g., wheelchair or bedside chair)?: A Little Help needed to walk in hospital room?: A Little Help needed climbing 3-5 steps with a railing? : A Little 6 Click Score: 19    End of Session Equipment Utilized During Treatment: Gait belt Activity Tolerance: Patient tolerated treatment well Patient left: in chair;with call bell/phone within reach;with chair alarm set   PT Visit Diagnosis: Difficulty in walking, not elsewhere classified (R26.2)     Time: [id number] PT Time Calculation (min) (ACUTE ONLY): 28 min  Charges:  $Gait Training: 8-22 mins $Therapeutic Exercise:  8-22 mins                     Kenyon Ana, PT  Pager: [phone] Acute Rehab Dept [facility]): [phone]   05/18/2019    [facility] 05/18/2019, 11:04 AM

## 2019-05-18 NOTE — TOC Transition Note (Signed)
Transition of Care [facility]) - CM/SW Discharge Note   Patient Details  Name: Shelley Spencer MRN: [id number] Date of Birth: 1946-09-04  Transition of Care [facility]) CM/SW Contact:  Leeroy Cha, RN Phone Number: 05/18/2019, 10:31 AM   Clinical Narrative:    dcd to home with equipment and to have OOPT at [facility]   Final next level of care: OP Rehab Barriers to Discharge: No Barriers Identified   Patient Goals and CMS Choice Patient states their goals for this hospitalization and ongoing recovery are:: none other than to go to therapy CMS Medicare.gov Compare Post Acute Care list provided to:: Patient Choice offered to / list presented to : Patient  Discharge Placement                       Discharge Plan and Services   Discharge Planning Services: CM Consult Post Acute Care Choice: Durable Medical Equipment          DME Arranged: 3-N-1, Walker rolling DME Agency: [employer] Date DME Agency Contacted: 05/18/19 Time DME Agency Contacted: 0900 Representative spoke with at DME Agency: nathan            Social Determinants of Health ([place]) Interventions     Readmission Risk Interventions No flowsheet data found.

## 2019-06-28 ENCOUNTER — Ambulatory Visit (INDEPENDENT_AMBULATORY_CARE_PROVIDER_SITE_OTHER): Payer: Medicare Other | Admitting: Cardiology

## 2019-06-28 ENCOUNTER — Encounter: Payer: Self-pay | Admitting: Cardiology

## 2019-06-28 ENCOUNTER — Other Ambulatory Visit: Payer: Self-pay

## 2019-06-28 VITALS — BP 130/78 | HR 87 | Temp 98.4°F | Ht 62.0 in | Wt 191.6 lb

## 2019-06-28 DIAGNOSIS — E7800 Pure hypercholesterolemia, unspecified: Secondary | ICD-10-CM | POA: Diagnosis not present

## 2019-06-28 DIAGNOSIS — I351 Nonrheumatic aortic (valve) insufficiency: Secondary | ICD-10-CM | POA: Diagnosis not present

## 2019-06-28 DIAGNOSIS — I2510 Atherosclerotic heart disease of native coronary artery without angina pectoris: Secondary | ICD-10-CM

## 2019-06-28 DIAGNOSIS — I10 Essential (primary) hypertension: Secondary | ICD-10-CM

## 2019-06-28 NOTE — Progress Notes (Signed)
Primary Physician:  Crist Infante, MD   Patient ID: Shelley Spencer, female    DOB: May 10, 1946, 73 y.o.   MRN: [id number]  Subjective:    Chief Complaint  Patient presents with  . Hypertension  . Follow-up    8wk    HPI: Shelley Spencer  is a 73 y.o. female  with hypertension, hyperlipidemia, hyperglycemia, CKD stage 3, former tobacco use with moderate CAD (no significant stenosis by [facility]) noted on Coronary CTA in July 2020, moderate AR by echo in July 2020.   She recently underwent right knee arthroplasy on 05/17/19 and is recovering well. Hoping to start exercising soon.   At her last office visit, metoprolol was discontinued in view of episodes of continued elevated BP and also AR. She was started on amlodipine, but developed leg swelling and was discontinued.   She has not had any recurrence of chest pain, shortness of breath, or fatigue. Blood pressure has been stable.   She has had negative lexiscan nuclear stress test both in 2015 and 2013.  She does care for her elderly husband that has had heart transplant and has a lot of stress related to this.   States that hypertension and hyperlipidemia are well controlled. Has tried several statins in the past, had myalgia symptoms. She is on Niacin and tolerates this well. She is a former smoker that quit in 1981.    Past Medical History:  Diagnosis Date  . Anemia   . Anxiety   . Arthritis   . Asthma   . Bicornuate uterus    double cervix  . Colon polyp   . Depression   . Deviated septum   . DJD (degenerative joint disease)    L-Spine  . Fibroids   . GERD (gastroesophageal reflux disease)   . Hematuria   . Hiatal hernia   . HNP (herniated nucleus pulposus)    lower back  . Hyperlipidemia   . Hypertension   . MVP (mitral valve prolapse)    pt denies, pt states she had a Echo ?2008 and it did not show MVP  . Pre-diabetes   . Retinal tear 2005   Small right retinal tear  . Shingles 1980's   on her waist    Past  Surgical History:  Procedure Laterality Date  . ANTERIOR CERVICAL DECOMP/DISCECTOMY FUSION N/A 09/05/2017   Procedure: Cervical four-five, Cervical five-six, Cervical six-seven Anterior cervical decompression/discectomy/fusion;  Surgeon: Erline Levine, MD;  Location: [place];  Service: Neurosurgery;  Laterality: N/A;  . bone spur surgery Bilateral    x 3  . CARDIOVASCULAR STRESS TEST  02/08/2014  . COLONOSCOPY    . CYST EXCISION Left 09/29/2018   Procedure: LEFT MIDDLE FINGER CYST REMOVAL AND DEBRIDEMENT OF DISTAL INTERPHALANGEAL JOINT;  Surgeon: Daryll Brod, MD;  Location: [place];  Service: Orthopedics;  Laterality: Left;  . FINGER SURGERY Left    left third finger mucoid cyst removed  . FOOT NEUROMA SURGERY Bilateral   . HYSTEROSCOPY     D&C PMB Endo Cx Polyp  . KNEE ARTHROSCOPY WITH MENISCAL REPAIR Right 2016   --Flossie Dibble, MD  . LAPAROSCOPIC HYSTERECTOMY  7/209/10   R-TLH/BSO  Fibroids, BAck Pain, Adenomyosis  . LUMBAR FUSION    . removal vaginal septum    . TOTAL KNEE ARTHROPLASTY Right 05/17/2019   Procedure: TOTAL KNEE ARTHROPLASTY;  Surgeon: Gaynelle Arabian, MD;  Location: [facility];  Service: Orthopedics;  Laterality: Right;  42min  . TUBAL LIGATION  Social History   Socioeconomic History  . Marital status: Married    Spouse name: Not on file  . Number of children: 2  . Years of education: Not on file  . Highest education level: Not on file  Occupational History  . Occupation: retired Product manager: RETIRED  Social Needs  . Financial resource strain: Not on file  . Food insecurity    Worry: Not on file    Inability: Not on file  . Transportation needs    Medical: Not on file    Non-medical: Not on file  Tobacco Use  . Smoking status: Former Smoker    Packs/day: 1.00    Years: 20.00    Pack years: 20.00    Types: Cigarettes    Quit date: 09/24/1979    Years since quitting: 39.7  . Smokeless tobacco: Never Used  Substance and Sexual  Activity  . Alcohol use: Not Currently    Alcohol/week: 1.0 standard drinks    Types: 1 Standard drinks or equivalent per week    Comment: rarely  . Drug use: No  . Sexual activity: Yes    Partners: Male    Birth control/protection: Surgical    Comment: R-TLH/BSO  Lifestyle  . Physical activity    Days per week: Not on file    Minutes per session: Not on file  . Stress: Not on file  Relationships  . Social Herbalist on phone: Not on file    Gets together: Not on file    Attends religious service: Not on file    Active member of club or organization: Not on file    Attends meetings of clubs or organizations: Not on file    Relationship status: Not on file  . Intimate partner violence    Fear of current or ex partner: Not on file    Emotionally abused: Not on file    Physically abused: Not on file    Forced sexual activity: Not on file  Other Topics Concern  . Not on file  Social History Narrative   Husband with heart transplant.  Lives with husband and son.    Review of Systems  Constitution: Negative for decreased appetite, malaise/fatigue, weight gain and weight loss.  Eyes: Negative for visual disturbance.  Cardiovascular: Negative for chest pain, claudication, dyspnea on exertion, leg swelling, orthopnea, palpitations and syncope.  Respiratory: Negative for hemoptysis, shortness of breath (asthma) and wheezing.   Endocrine: Negative for cold intolerance and heat intolerance.  Hematologic/Lymphatic: Does not bruise/bleed easily.  Skin: Negative for nail changes.  Musculoskeletal: Negative for muscle weakness and myalgias.  Gastrointestinal: Negative for abdominal pain, change in bowel habit, nausea and vomiting.  Neurological: Negative for difficulty with concentration, dizziness, focal weakness (exertional bilateral arm weakness) and headaches.  Psychiatric/Behavioral: Negative for altered mental status and suicidal ideas.  All other systems reviewed and  are negative.     Objective:  Blood pressure 130/78, pulse 87, temperature 98.4 F (36.9 C), height 5\' 2"  (1.575 m), weight 191 lb 9.6 oz (86.9 kg), SpO2 96 %. Body mass index is 35.04 kg/m.    Physical Exam  Constitutional: She is oriented to person, place, and time. Vital signs are normal. She appears well-developed and well-nourished. She is cooperative.  Moderately obese  HENT:  Head: Normocephalic and atraumatic.  Neck: Normal range of motion.  Cardiovascular: Normal rate, regular rhythm, normal heart sounds and intact distal pulses.  Pulmonary/Chest: Effort normal and breath  sounds normal. No accessory muscle usage. No respiratory distress.  Abdominal: Soft. Bowel sounds are normal.  Musculoskeletal: Normal range of motion.  Neurological: She is alert and oriented to person, place, and time.  Skin: Skin is warm and dry.  Vitals reviewed.  Radiology: No results found.  Laboratory examination:    CMP Latest Ref Rng & Units 05/18/2019 03/30/2019 03/19/2019  Glucose 70 - 99 mg/dL 164(H) 95 103(H)  BUN 8 - 23 mg/dL 17 16 20   Creatinine 0.44 - 1.00 mg/dL 1.08(H) 1.23(H) 1.26(H)  Sodium 135 - 145 mmol/L 138 142 145(H)  Potassium 3.5 - 5.1 mmol/L 4.5 4.3 4.4  Chloride 98 - 111 mmol/L 108 105 104  CO2 22 - 32 mmol/L 24 27 23   Calcium 8.9 - 10.3 mg/dL 8.6(L) 9.5 9.7  Total Protein 6.0 - 8.3 g/dL - - -  Total Bilirubin 0.3 - 1.2 mg/dL - - -  Alkaline Phos 39 - 117 U/L - - -  AST 0 - 37 U/L - - -  ALT 0 - 35 U/L - - -   CBC Latest Ref Rng & Units 05/18/2019 09/02/2017 12/22/2013  WBC 4.0 - 10.5 K/uL 10.2 6.4 7.3  Hemoglobin 12.0 - 15.0 g/dL 10.5(L) 13.4 13.5  Hematocrit 36.0 - 46.0 % 34.4(L) 41.5 40.6  Platelets 150 - 400 K/uL 217 232 230   Lipid Panel  No results found for: CHOL, TRIG, HDL, CHOLHDL, VLDL, LDLCALC, LDLDIRECT HEMOGLOBIN A1C Lab Results  Component Value Date   HGBA1C 5.7 (H) 09/02/2017   MPG 116.89 09/02/2017   TSH No results for input(s): TSH in the last  8760 hours.  PRN Meds:. Medications Discontinued During This Encounter  Medication Reason  . methocarbamol (ROBAXIN) 500 MG tablet No longer needed (for PRN medications)  . oxyCODONE (OXY IR/ROXICODONE) 5 MG immediate release tablet Completed Course   Current Meds  Medication Sig  . albuterol (VENTOLIN HFA) 108 (90 Base) MCG/ACT inhaler Inhale 1-2 puffs into the lungs every 6 (six) hours as needed for wheezing or shortness of breath.   . budesonide-formoterol (SYMBICORT) 160-4.5 MCG/ACT inhaler Inhale 2 puffs into the lungs daily.   Marland Kitchen buPROPion (WELLBUTRIN XL) 150 MG 24 hr tablet Take 150 mg by mouth daily.  Marland Kitchen CALCIUM-VITAMIN D PO Take 1 tablet by mouth daily.  . cetirizine (ZYRTEC) 10 MG tablet Take 10 mg by mouth daily.  Marland Kitchen docusate sodium (COLACE) 100 MG capsule Take 1 capsule (100 mg total) by mouth 2 (two) times daily. (Patient taking differently: Take 100 mg by mouth daily as needed for moderate constipation. )  . doxazosin (CARDURA) 4 MG tablet Take 4 mg by mouth daily.   . furosemide (LASIX) 20 MG tablet Take 20 mg by mouth daily.  Marland Kitchen gabapentin (NEURONTIN) 300 MG capsule Take 1 capsule (300 mg total) by mouth 3 (three) times daily. Take a 300 mg capsule three times a day for two weeks following surgery.Then take a 300 mg capsule two times a day for two weeks. Then take a 300 mg capsule once a day for two weeks. Then discontinue the Gabapentin.  . magnesium gluconate (MAGONATE) 500 MG tablet Take 500 mg by mouth daily.  . Multiple Vitamins-Minerals (MULTIVITAMIN PO) Take 1 tablet by mouth daily.  . niacin (NIASPAN) 1000 MG CR tablet Take 1,000 mg by mouth at bedtime.  . nitroGLYCERIN (NITROSTAT) 0.4 MG SL tablet Place 1 tablet (0.4 mg total) under the tongue every 5 (five) minutes as needed for chest pain.  Marland Kitchen olmesartan (  BENICAR) 40 MG tablet Take 40 mg by mouth every evening.  . pantoprazole (PROTONIX) 40 MG tablet Take 1 tablet (40 mg total) by mouth 2 (two) times daily. (Patient  taking differently: Take 40 mg by mouth every evening. )  . PARoxetine (PAXIL) 40 MG tablet Take 40 mg by mouth every evening.   . potassium chloride SA (K-DUR) 20 MEQ tablet Take 20 mEq by mouth daily.   . traZODone (DESYREL) 150 MG tablet Take 150 mg by mouth at bedtime.  . vitamin B-12 (CYANOCOBALAMIN) 1000 MCG tablet Take 1,000 mcg by mouth daily.  . [DISCONTINUED] methocarbamol (ROBAXIN) 500 MG tablet Take 1 tablet (500 mg total) by mouth every 6 (six) hours as needed for muscle spasms.    Cardiac Studies:   Coronary CTA 03/30/2019:  1. Coronary calcium score of 33. This was 64 percentile for age and sex matched control.  2. Normal coronary origin with left dominance.  3. Moderate plaque with stenosis 50-69% in the mid LAD. CAD RADS 3. Additional analysis with CT FFR will be submitted.  Coronary CTA FFR 03/30/2019:  1. Left Main: No significant stenosis. 2. LAD: No significant stenosis. 3. LCX: No significant stenosis. 4. RCA: No significant stenosis.  IMPRESSION: 1.  CT FFR analysis didn't show any significant stenosis.  Echocardiogram 04/23/2019 :  Normal LV systolic function with EF 55%. Left ventricle cavity is normal in size. Mild concentric hypertrophy of the left ventricle. Normal global wall motion. Doppler evidence of grade I (impaired) diastolic dysfunction, normal LAP. Calculated EF 55%. Trileaflet aortic valve. Moderate (Grade II) aortic regurgitation. Structurally normal appearing mitral valve. Mild (Grade I) mitral regurgitation.  Assessment:     ICD-10-CM   1. Benign essential HTN  I10   2. Atherosclerosis of native coronary artery of native heart without angina pectoris  I25.10   3. Moderate aortic regurgitation  I35.1   4. Pure hypercholesterolemia  E78.00     EKG 03/15/2019: Normal sinus rhythm at 90 bpm, left atrial enlargement, left axis deviation, no evidence of ischemia.  Recommendations:   Patient is here for follow-up on hypertension.   She was started on amlodipine at her last office visit, but developed leg edema.  Since being off amlodipine, leg edema has resolved.  Her blood pressure has actually been stable.  She had been out of her Cardura and suspects this was contributing to her elevated blood pressure.  We will continue with her current medications for now.  She has not had any recurrence of chest pain or shortness of breath.  She does have moderate CAD by coronary CTA.  Will need continued aggressive risk factor modification.  I do not have recent lipid panel, will obtain this from PCP office.  She is on niacin for management.  She states she has been on multiple statins in the past and developed myalgia symptoms with all of them and does not wish to start them.  If her lipids are not well controlled, could consider PCSK9 inhibitor.  She does have moderate aortic regurgitation by echocardiogram but will need continued surveillance.  Overall patient is doing well and is looking forward to starting exercising at the gym in the next few weeks.  We will plan to see her back in 6 months or sooner if problems.   Miquel Dunn, MSN, APRN, FNP-C [facility]. [place] Office: [phone] Fax: [phone]

## 2019-06-30 NOTE — Progress Notes (Signed)
05/07/2019: Creatinine 1.1, EGFR 49/59, potassium 4.1, BMP normal.  Normal H&H, MCH low at 25.5, MCHC 30.0, CBC otherwise normal.  Cholesterol 203, triglycerides 107, HDL 64, LDL 118. TSH normal.

## 2019-10-30 ENCOUNTER — Ambulatory Visit: Payer: Medicare Other

## 2019-11-17 ENCOUNTER — Ambulatory Visit: Payer: Medicare Other

## 2019-12-16 ENCOUNTER — Other Ambulatory Visit: Payer: Self-pay | Admitting: Cardiology

## 2019-12-27 ENCOUNTER — Ambulatory Visit: Payer: Medicare Other | Admitting: Cardiology

## 2019-12-27 ENCOUNTER — Encounter: Payer: Self-pay | Admitting: Cardiology

## 2019-12-27 ENCOUNTER — Other Ambulatory Visit: Payer: Self-pay

## 2019-12-27 VITALS — BP 133/72 | HR 72 | Temp 98.6°F | Resp 16 | Ht 62.0 in | Wt 195.0 lb

## 2019-12-27 DIAGNOSIS — I351 Nonrheumatic aortic (valve) insufficiency: Secondary | ICD-10-CM

## 2019-12-27 DIAGNOSIS — I10 Essential (primary) hypertension: Secondary | ICD-10-CM

## 2019-12-27 DIAGNOSIS — E7800 Pure hypercholesterolemia, unspecified: Secondary | ICD-10-CM

## 2019-12-27 DIAGNOSIS — I2510 Atherosclerotic heart disease of native coronary artery without angina pectoris: Secondary | ICD-10-CM

## 2019-12-27 MED ORDER — EZETIMIBE 10 MG PO TABS: 10.0000 mg | ORAL_TABLET | Freq: Every day | ORAL | 2 refills | Status: DC

## 2019-12-27 MED ORDER — SIMVASTATIN 20 MG PO TABS: 20.0000 mg | ORAL_TABLET | ORAL | 2 refills | Status: DC

## 2019-12-27 MED ORDER — SIMVASTATIN 20 MG PO TABS: 20.0000 mg | ORAL_TABLET | Freq: Every day | ORAL | 2 refills | Status: DC

## 2019-12-27 NOTE — Progress Notes (Signed)
Primary Physician:  Crist Infante, MD   Patient ID: Shelley Spencer, female    DOB: 08/13/1946, 74 y.o.   MRN: [id number]  Subjective:    Chief Complaint  Patient presents with  . Hypertension  . Coronary Artery Disease  . Follow-up    6 month    HPI: Shelley Spencer  is a 74 y.o. female  with hypertension, hyperlipidemia, hyperglycemia, CKD stage 3, former tobacco use with moderate CAD with LAD 50-60% stenosis by coronary CTA on 03/30/2019  (not significant stenosis by FFR), moderate AR by echo in July 2020.  She does care for her elderly husband that has had heart transplant and has a lot of stress related to this. She is statin intolerant and presently on Niacin and tolerates this well. She is a former smoker that quit in 1981.    She is presently asymptomatic.  Tolerating all medications well and states that her blood pressure has been well controlled.  Past Medical History:  Diagnosis Date  . Anemia   . Anxiety   . Arthritis   . Asthma   . Bicornuate uterus    double cervix  . Colon polyp   . Depression   . Deviated septum   . DJD (degenerative joint disease)    L-Spine  . Fibroids   . GERD (gastroesophageal reflux disease)   . Hematuria   . Hiatal hernia   . HNP (herniated nucleus pulposus)    lower back  . Hyperlipidemia   . Hypertension   . MVP (mitral valve prolapse)    pt denies, pt states she had a Echo ?2008 and it did not show MVP  . Pre-diabetes   . Retinal tear 2005   Small right retinal tear  . Shingles 1980's   on her waist    Past Surgical History:  Procedure Laterality Date  . ANTERIOR CERVICAL DECOMP/DISCECTOMY FUSION N/A 09/05/2017   Procedure: Cervical four-five, Cervical five-six, Cervical six-seven Anterior cervical decompression/discectomy/fusion;  Surgeon: Erline Levine, MD;  Location: [place];  Service: Neurosurgery;  Laterality: N/A;  . bone spur surgery Bilateral    x 3  . CARDIOVASCULAR STRESS TEST  02/08/2014  . COLONOSCOPY    . CYST  EXCISION Left 09/29/2018   Procedure: LEFT MIDDLE FINGER CYST REMOVAL AND DEBRIDEMENT OF DISTAL INTERPHALANGEAL JOINT;  Surgeon: Daryll Brod, MD;  Location: [place];  Service: Orthopedics;  Laterality: Left;  . FINGER SURGERY Left    left third finger mucoid cyst removed  . FOOT NEUROMA SURGERY Bilateral   . HYSTEROSCOPY     D&C PMB Endo Cx Polyp  . KNEE ARTHROSCOPY WITH MENISCAL REPAIR Right 2016   --Flossie Dibble, MD  . LAPAROSCOPIC HYSTERECTOMY  7/209/10   R-TLH/BSO  Fibroids, BAck Pain, Adenomyosis  . LUMBAR FUSION    . removal vaginal septum    . TOTAL KNEE ARTHROPLASTY Right 05/17/2019   Procedure: TOTAL KNEE ARTHROPLASTY;  Surgeon: Gaynelle Arabian, MD;  Location: [facility];  Service: Orthopedics;  Laterality: Right;  67mn  . TUBAL LIGATION     Social History   Tobacco Use  . Smoking status: Former Smoker    Packs/day: 1.00    Years: 20.00    Pack years: 20.00    Types: Cigarettes    Quit date: 09/24/1979    Years since quitting: 40.2  . Smokeless tobacco: Never Used  Substance Use Topics  . Alcohol use: Not Currently    Alcohol/week: 1.0 standard drinks  Types: 1 Standard drinks or equivalent per week    Comment: rarely    Marital status: Married   Review of Systems  Cardiovascular: Negative for chest pain, dyspnea on exertion and leg swelling.  Gastrointestinal: Negative for melena.   Objective:  Blood pressure 133/72, pulse 72, temperature 98.6 F (37 C), temperature source Temporal, resp. rate 16, height '5\' 2"'$  (1.575 m), weight 195 lb (88.5 kg), SpO2 95 %. Body mass index is 35.67 kg/m.   Vitals with BMI 12/27/2019 06/28/2019 05/18/2019  Height '5\' 2"'$  '5\' 2"'$  -  Weight 195 lbs 191 lbs 10 oz -  BMI 02.63 78.58 -  Systolic 850 277 412  Diastolic 72 78 71  Pulse 72 87 76      Physical Exam  Constitutional: Vital signs are normal. She appears well-developed and well-nourished. She is cooperative.  Moderately obese  Cardiovascular: Normal rate, regular  rhythm and intact distal pulses.  Murmur heard. High-pitched decrescendo early diastolic murmur is present with a grade of 2/6 at the upper right sternal border. No JVD. No leg edema.  Pulmonary/Chest: Effort normal and breath sounds normal. No accessory muscle usage. No respiratory distress.  Abdominal: Soft. Bowel sounds are normal.  Musculoskeletal:        General: Normal range of motion.  Vitals reviewed.  Radiology: No results found.  Laboratory examination:    CMP Latest Ref Rng & Units 05/18/2019 03/30/2019 03/19/2019  Glucose 70 - 99 mg/dL 164(H) 95 103(H)  BUN 8 - 23 mg/dL '17 16 20  '$ Creatinine 0.44 - 1.00 mg/dL 1.08(H) 1.23(H) 1.26(H)  Sodium 135 - 145 mmol/L 138 142 145(H)  Potassium 3.5 - 5.1 mmol/L 4.5 4.3 4.4  Chloride 98 - 111 mmol/L 108 105 104  CO2 22 - 32 mmol/L '24 27 23  '$ Calcium 8.9 - 10.3 mg/dL 8.6(L) 9.5 9.7  Total Protein 6.0 - 8.3 g/dL - - -  Total Bilirubin 0.3 - 1.2 mg/dL - - -  Alkaline Phos 39 - 117 U/L - - -  AST 0 - 37 U/L - - -  ALT 0 - 35 U/L - - -   CBC Latest Ref Rng & Units 05/18/2019 09/02/2017 12/22/2013  WBC 4.0 - 10.5 K/uL 10.2 6.4 7.3  Hemoglobin 12.0 - 15.0 g/dL 10.5(L) 13.4 13.5  Hematocrit 36.0 - 46.0 % 34.4(L) 41.5 40.6  Platelets 150 - 400 K/uL 217 232 230   Lipid Panel  No results found for: CHOL, TRIG, HDL, CHOLHDL, VLDL, LDLCALC, LDLDIRECT HEMOGLOBIN A1C Lab Results  Component Value Date   HGBA1C 5.7 (H) 09/02/2017   MPG 116.89 09/02/2017   TSH No results for input(s): TSH in the last 8760 hours.   External labs:  05/07/2019: Creatinine 1.1, EGFR 49/59, potassium 4.1, BMP normal.  Normal H&H, MCH low at 25.5, MCHC 30.0, CBC otherwise normal.  Cholesterol 203, triglycerides 107, HDL 64, LDL 118. TSH normal.  PRN Meds:. There are no discontinued medications. Current Meds  Medication Sig  . albuterol (VENTOLIN HFA) 108 (90 Base) MCG/ACT inhaler Inhale 1-2 puffs into the lungs every 6 (six) hours as needed for wheezing or  shortness of breath.   . budesonide-formoterol (SYMBICORT) 160-4.5 MCG/ACT inhaler Inhale 2 puffs into the lungs daily.   Marland Kitchen buPROPion (WELLBUTRIN XL) 150 MG 24 hr tablet Take 150 mg by mouth daily.  Marland Kitchen CALCIUM-VITAMIN D PO Take 1 tablet by mouth daily.  . cetirizine (ZYRTEC) 10 MG tablet Take 10 mg by mouth daily.  Marland Kitchen docusate sodium (COLACE) 100  MG capsule Take 1 capsule (100 mg total) by mouth 2 (two) times daily. (Patient taking differently: Take 100 mg by mouth daily as needed for moderate constipation. )  . doxazosin (CARDURA) 4 MG tablet Take 4 mg by mouth daily.   . furosemide (LASIX) 20 MG tablet Take 20 mg by mouth daily.  Marland Kitchen gabapentin (NEURONTIN) 300 MG capsule Take 1 capsule (300 mg total) by mouth 3 (three) times daily. Take a 300 mg capsule three times a day for two weeks following surgery.Then take a 300 mg capsule two times a day for two weeks. Then take a 300 mg capsule once a day for two weeks. Then discontinue the Gabapentin.  . magnesium gluconate (MAGONATE) 500 MG tablet Take 500 mg by mouth daily.  . Multiple Vitamins-Minerals (MULTIVITAMIN PO) Take 1 tablet by mouth daily.  . niacin (NIASPAN) 1000 MG CR tablet Take 1,000 mg by mouth at bedtime.  . nitroGLYCERIN (NITROSTAT) 0.4 MG SL tablet Place 1 tablet (0.4 mg total) under the tongue every 5 (five) minutes as needed for chest pain.  Marland Kitchen olmesartan (BENICAR) 40 MG tablet Take 40 mg by mouth every evening.  . pantoprazole (PROTONIX) 40 MG tablet Take 1 tablet (40 mg total) by mouth 2 (two) times daily. (Patient taking differently: Take 40 mg by mouth every evening. )  . PARoxetine (PAXIL) 40 MG tablet Take 40 mg by mouth every evening.   . potassium chloride SA (K-DUR) 20 MEQ tablet Take 20 mEq by mouth daily.   . traZODone (DESYREL) 150 MG tablet Take 150 mg by mouth at bedtime.  . vitamin B-12 (CYANOCOBALAMIN) 1000 MCG tablet Take 1,000 mcg by mouth daily.    Cardiac Studies:   Coronary CTA 03/30/2019:  1. Coronary calcium  score of 33. This was 67 percentile for age and sex matched control. 2. Normal coronary origin with left dominance. 3. Moderate plaque with stenosis 50-69% in the mid LAD. CAD RADS 3. Additional analysis with CT FFR will be submitted.  Coronary CTA FFR 03/30/2019:  1. Left Main: No significant stenosis. 2. LAD: No significant stenosis. 3. LCX: No significant stenosis. 4. RCA: No significant stenosis. IMPRESSION: 1.  CT FFR analysis didn't show any significant stenosis.  Echocardiogram 04/23/2019 :  Normal LV systolic function with EF 55%. Left ventricle cavity is normal in size. Mild concentric hypertrophy of the left ventricle. Normal global wall motion. Doppler evidence of grade I (impaired) diastolic dysfunction, normal LAP. Calculated EF 55%. Trileaflet aortic valve. Moderate (Grade II) aortic regurgitation. Structurally normal appearing mitral valve. Mild (Grade I) mitral regurgitation.  EKG:  EKG 03/15/2019: Normal sinus rhythm at 90 bpm, left atrial enlargement, left axis deviation, no evidence of ischemia.  Assessment:     ICD-10-CM   1. Atherosclerosis of native coronary artery of native heart without angina pectoris  I25.10 simvastatin (ZOCOR) 20 MG tablet  2. Moderate aortic regurgitation  I35.1   3. Pure hypercholesterolemia  E78.00 simvastatin (ZOCOR) 20 MG tablet    ezetimibe (ZETIA) 10 MG tablet    Lipid Profile    Lipid Profile  4. Benign essential HTN  I10 EKG 12-Lead    Vitamin D 1,25 dihydroxy   Recommendations:   Shelley Spencer  is a 74 y.o. female  with hypertension, hyperlipidemia, hyperglycemia, CKD stage 3, former tobacco use with moderate CAD with LAD 50-60% stenosis by coronary CTA on 03/30/2019  (not significant stenosis by FFR), moderate AR by echo in July 2020.  She does care for her  elderly husband that has had heart transplant and has a lot of stress related to this. She is statin intolerant and presently on Niacin and tolerates this well. She is a  former smoker that quit in 1981.    I reviewed the echocardiogram the patient and advised her the reasons for her to continue to have cardiology follow-up please include underlying moderate coronary artery disease, moderate aortic regurgitation, difficulty in tolerating statins and hyperlipidemia.  She is now willing to try statins at a reduced dose, will start with simvastatin 20 mg twice a week and if she tolerates this could increase it to 3 times a week.  She will also start with Zetia 10 mg daily after 2 to 3 weeks of being on statins to see the tolerability.  In view of markedly elevated LDL, she will need combination therapy.  Other option would be Nexlizet.  With regard to hypertension, blood pressure is well controlled.  No clinical evidence of heart failure.  External labs were reviewed and updated.  I would like to see her back in 2 to 3 months for follow-up of lipids.   Adrian Prows, MD, [facility] 12/27/2019, 4:46 PM [facility]. [place] Office: [phone]

## 2019-12-29 LAB — LIPID PANEL
Chol/HDL Ratio: 3.1 ratio (ref 0.0–4.4)
Cholesterol, Total: 211 mg/dL — ABNORMAL HIGH (ref 100–199)
HDL: 69 mg/dL (ref >39)
LDL Chol Calc (NIH): 95 mg/dL (ref 0–99)
Triglycerides: 282 mg/dL — ABNORMAL HIGH (ref 0–149)
VLDL Cholesterol Cal: 47 mg/dL — ABNORMAL HIGH (ref 5–40)

## 2019-12-29 NOTE — Addendum Note (Signed)
Addended by: Kela Millin on: 12/29/2019 11:34 AM   Modules accepted: Orders

## 2019-12-29 NOTE — Progress Notes (Signed)
Vit D pending. Cholesterol was not expected to be done yesterday, but was done hence TG are elevated as it was not fasting. WIll recheck lipid prior to OV

## 2020-01-07 LAB — VITAMIN D 1,25 DIHYDROXY
Vitamin D 1, 25 (OH)2 Total: 34 pg/mL
Vitamin D2 1, 25 (OH)2: <10 pg/mL
Vitamin D3 1, 25 (OH)2: 31 pg/mL

## 2020-02-22 DIAGNOSIS — I639 Cerebral infarction, unspecified: Secondary | ICD-10-CM

## 2020-02-22 HISTORY — DX: Cerebral infarction, unspecified: I63.9

## 2020-03-06 ENCOUNTER — Encounter: Payer: Self-pay | Admitting: Cardiology

## 2020-03-06 ENCOUNTER — Telehealth: Payer: Self-pay

## 2020-03-06 ENCOUNTER — Other Ambulatory Visit: Payer: Self-pay

## 2020-03-06 ENCOUNTER — Ambulatory Visit: Payer: Medicare Other | Admitting: Cardiology

## 2020-03-06 VITALS — BP 111/52 | HR 90 | Resp 16 | Ht 62.0 in | Wt 194.0 lb

## 2020-03-06 DIAGNOSIS — I2510 Atherosclerotic heart disease of native coronary artery without angina pectoris: Secondary | ICD-10-CM

## 2020-03-06 DIAGNOSIS — I10 Essential (primary) hypertension: Secondary | ICD-10-CM

## 2020-03-06 DIAGNOSIS — E7800 Pure hypercholesterolemia, unspecified: Secondary | ICD-10-CM

## 2020-03-06 MED ORDER — SIMVASTATIN 20 MG PO TABS: 20.0000 mg | ORAL_TABLET | Freq: Every day | ORAL | 3 refills | Status: DC

## 2020-03-06 MED ORDER — EZETIMIBE 10 MG PO TABS: 10.0000 mg | ORAL_TABLET | Freq: Every day | ORAL | 3 refills | Status: DC

## 2020-03-06 NOTE — Telephone Encounter (Signed)
Patient did not bring in any medication bottles. Medications confirmed verbally.

## 2020-03-06 NOTE — Progress Notes (Signed)
Primary Physician:  Crist Infante, MD   Patient ID: Shelley Spencer, female    DOB: October 07, 1945, 74 y.o.   MRN: [id number]  Subjective:    Chief Complaint  Patient presents with  . Follow-up    10 week  . Aortic Regurgitation  . Hyperlipidemia  . Coronary Artery Disease    HPI: Shelley Spencer  is a 74 y.o. female  with hypertension, hyperlipidemia, hyperglycemia, CKD stage 3, former tobacco use with moderate CAD with LAD 50-60% stenosis by coronary CTA on 03/30/2019  (not significant stenosis by FFR), moderate AR by echo in July 2020.  She does care for her elderly husband that has had heart transplant and has a lot of stress related to this. She is statin intolerant and presently on Niacin and tolerates this well. She is a former smoker that quit in 1981.    She is presently asymptomatic.  On her last office visit I had added simvastatin 20 mg twice daily and if tolerated with plans of increasing to 3 times a week.  Zetia was also added in view of markedly elevated LDL.  She now presents for follow-up. Tolerating the meidcations  Past Medical History:  Diagnosis Date  . Anemia   . Anxiety   . Arthritis   . Asthma   . Bicornuate uterus    double cervix  . Colon polyp   . Depression   . Deviated septum   . DJD (degenerative joint disease)    L-Spine  . Fibroids   . GERD (gastroesophageal reflux disease)   . Hematuria   . Hiatal hernia   . HNP (herniated nucleus pulposus)    lower back  . Hyperlipidemia   . Hypertension   . MVP (mitral valve prolapse)    pt denies, pt states she had a Echo ?2008 and it did not show MVP  . Pre-diabetes   . Retinal tear 2005   Small right retinal tear  . Shingles 1980's   on her waist    Past Surgical History:  Procedure Laterality Date  . ANTERIOR CERVICAL DECOMP/DISCECTOMY FUSION N/A 09/05/2017   Procedure: Cervical four-five, Cervical five-six, Cervical six-seven Anterior cervical decompression/discectomy/fusion;  Surgeon: Erline Levine, MD;  Location: [place];  Service: Neurosurgery;  Laterality: N/A;  . bone spur surgery Bilateral    x 3  . CARDIOVASCULAR STRESS TEST  02/08/2014  . COLONOSCOPY    . CYST EXCISION Left 09/29/2018   Procedure: LEFT MIDDLE FINGER CYST REMOVAL AND DEBRIDEMENT OF DISTAL INTERPHALANGEAL JOINT;  Surgeon: Daryll Brod, MD;  Location: [place];  Service: Orthopedics;  Laterality: Left;  . FINGER SURGERY Left    left third finger mucoid cyst removed  . FOOT NEUROMA SURGERY Bilateral   . HYSTEROSCOPY     D&C PMB Endo Cx Polyp  . KNEE ARTHROSCOPY WITH MENISCAL REPAIR Right 2016   --Flossie Dibble, MD  . LAPAROSCOPIC HYSTERECTOMY  7/209/10   R-TLH/BSO  Fibroids, BAck Pain, Adenomyosis  . LUMBAR FUSION    . removal vaginal septum    . TOTAL KNEE ARTHROPLASTY Right 05/17/2019   Procedure: TOTAL KNEE ARTHROPLASTY;  Surgeon: Gaynelle Arabian, MD;  Location: [facility];  Service: Orthopedics;  Laterality: Right;  37mn  . TUBAL LIGATION     Social History   Tobacco Use  . Smoking status: Former Smoker    Packs/day: 1.00    Years: 20.00    Pack years: 20.00    Types: Cigarettes    Quit  date: 09/24/1979    Years since quitting: 40.4  . Smokeless tobacco: Never Used  Substance Use Topics  . Alcohol use: Not Currently    Alcohol/week: 1.0 standard drink    Types: 1 Standard drinks or equivalent per week    Comment: rarely    Marital status: Married   Review of Systems  Cardiovascular: Negative for chest pain, dyspnea on exertion and leg swelling.  Gastrointestinal: Negative for melena.   Objective:  Blood pressure (!) 111/52, pulse 90, resp. rate 16, height '5\' 2"'$  (1.575 m), weight 194 lb (88 kg), SpO2 94 %. Body mass index is 35.48 kg/m.   Vitals with BMI 03/06/2020 12/27/2019 06/28/2019  Height '5\' 2"'$  '5\' 2"'$  '5\' 2"'$   Weight 194 lbs 195 lbs 191 lbs 10 oz  BMI 35.47 22.97 98.92  Systolic 119 417 408  Diastolic 52 72 78  Pulse 90 72 87      Physical Exam Vitals reviewed.    Constitutional:      Appearance: She is well-developed.     Comments: Moderately obese  Cardiovascular:     Rate and Rhythm: Normal rate and regular rhythm.     Pulses: Intact distal pulses.     Heart sounds: Murmur heard. High-pitched decrescendo early diastolic murmur is present with a grade of 2/4 at the upper right sternal border.      Comments: No JVD. No leg edema. Pulmonary:     Effort: Pulmonary effort is normal. No accessory muscle usage or respiratory distress.     Breath sounds: Normal breath sounds.  Abdominal:     General: Bowel sounds are normal.     Palpations: Abdomen is soft.  Musculoskeletal:        General: Normal range of motion.  Psychiatric:        Behavior: Behavior is cooperative.    Radiology: No results found.  Laboratory examination:    CMP Latest Ref Rng & Units 05/18/2019 03/30/2019 03/19/2019  Glucose 70 - 99 mg/dL 164(H) 95 103(H)  BUN 8 - 23 mg/dL '17 16 20  '$ Creatinine 0.44 - 1.00 mg/dL 1.08(H) 1.23(H) 1.26(H)  Sodium 135 - 145 mmol/L 138 142 145(H)  Potassium 3.5 - 5.1 mmol/L 4.5 4.3 4.4  Chloride 98 - 111 mmol/L 108 105 104  CO2 22 - 32 mmol/L '24 27 23  '$ Calcium 8.9 - 10.3 mg/dL 8.6(L) 9.5 9.7  Total Protein 6.0 - 8.3 g/dL - - -  Total Bilirubin 0.3 - 1.2 mg/dL - - -  Alkaline Phos 39 - 117 U/L - - -  AST 0 - 37 U/L - - -  ALT 0 - 35 U/L - - -   CBC Latest Ref Rng & Units 05/18/2019 09/02/2017 12/22/2013  WBC 4.0 - 10.5 K/uL 10.2 6.4 7.3  Hemoglobin 12.0 - 15.0 g/dL 10.5(L) 13.4 13.5  Hematocrit 36 - 46 % 34.4(L) 41.5 40.6  Platelets 150 - 400 K/uL 217 232 230   Lipid Panel     Component Value Date/Time   CHOL 211 (H) 12/28/2019 1452   TRIG 282 (H) 12/28/2019 1452   HDL 69 12/28/2019 1452   CHOLHDL 3.1 12/28/2019 1452   LDLCALC 95 12/28/2019 1452   HEMOGLOBIN A1C Lab Results  Component Value Date   HGBA1C 5.7 (H) 09/02/2017   MPG 116.89 09/02/2017   TSH No results for input(s): TSH in the last 8760 hours.   Vitamin D 1, 25  (OH)2 Total 12/28/2019 pg/mL 34     External labs:  05/07/2019:  Creatinine 1.1, EGFR 49/59, potassium 4.1, BMP normal.  Normal H&H, MCH low at 25.5, MCHC 30.0, CBC otherwise normal.  Cholesterol 203, triglycerides 107, HDL 64, LDL 118. TSH normal.   PRN Meds:. Medications Discontinued During This Encounter  Medication Reason  . magnesium gluconate (MAGONATE) 500 MG tablet Patient Preference  . ezetimibe (ZETIA) 10 MG tablet Reorder  . simvastatin (ZOCOR) 20 MG tablet Reorder   Current Meds  Medication Sig  . albuterol (VENTOLIN HFA) 108 (90 Base) MCG/ACT inhaler Inhale 1-2 puffs into the lungs every 6 (six) hours as needed for wheezing or shortness of breath.   . ALPRAZolam (XANAX) 0.25 MG tablet Take 0.25 mg by mouth 3 (three) times daily as needed.  . budesonide-formoterol (SYMBICORT) 160-4.5 MCG/ACT inhaler Inhale 2 puffs into the lungs daily.   Marland Kitchen buPROPion (WELLBUTRIN XL) 150 MG 24 hr tablet Take 150 mg by mouth daily.  Marland Kitchen CALCIUM-VITAMIN D PO Take 1 tablet by mouth daily.  . cetirizine (ZYRTEC) 10 MG tablet Take 10 mg by mouth daily.  Marland Kitchen docusate sodium (COLACE) 100 MG capsule Take 1 capsule (100 mg total) by mouth 2 (two) times daily. (Patient taking differently: Take 100 mg by mouth daily as needed for moderate constipation. )  . doxazosin (CARDURA) 4 MG tablet Take 4 mg by mouth daily.   Marland Kitchen ezetimibe (ZETIA) 10 MG tablet Take 1 tablet (10 mg total) by mouth daily after supper.  . furosemide (LASIX) 20 MG tablet Take 20 mg by mouth daily.  Marland Kitchen gabapentin (NEURONTIN) 300 MG capsule Take 1 capsule (300 mg total) by mouth 3 (three) times daily. Take a 300 mg capsule three times a day for two weeks following surgery.Then take a 300 mg capsule two times a day for two weeks. Then take a 300 mg capsule once a day for two weeks. Then discontinue the Gabapentin.  . Multiple Vitamins-Minerals (MULTIVITAMIN PO) Take 1 tablet by mouth daily.  . niacin (NIASPAN) 1000 MG CR tablet Take 1,000 mg by  mouth at bedtime.  Marland Kitchen olmesartan (BENICAR) 40 MG tablet Take 40 mg by mouth every evening.  . pantoprazole (PROTONIX) 40 MG tablet Take 1 tablet (40 mg total) by mouth 2 (two) times daily. (Patient taking differently: Take 40 mg by mouth every evening. )  . PARoxetine (PAXIL) 40 MG tablet Take 40 mg by mouth every evening.   . potassium chloride SA (K-DUR) 20 MEQ tablet Take 20 mEq by mouth daily.   . simvastatin (ZOCOR) 20 MG tablet Take 1 tablet (20 mg total) by mouth daily after supper. Tue and Thur for 2 weeks and if tolerated three times a week.  . traZODone (DESYREL) 150 MG tablet Take 150 mg by mouth at bedtime.  . vitamin B-12 (CYANOCOBALAMIN) 1000 MCG tablet Take 1,000 mcg by mouth daily.  . [DISCONTINUED] ezetimibe (ZETIA) 10 MG tablet Take 1 tablet (10 mg total) by mouth daily after supper.  . [DISCONTINUED] simvastatin (ZOCOR) 20 MG tablet Take 1 tablet (20 mg total) by mouth as directed. Tue and Thur for 2 weeks and if tolerated three times a week.   Cardiac Studies:   Coronary CTA 03/30/2019:  1. Coronary calcium score of 33. This was 23 percentile for age and sex matched control. 2. Normal coronary origin with left dominance. 3. Moderate plaque with stenosis 50-69% in the mid LAD. CAD RADS 3. Additional analysis with CT FFR will be submitted.  Coronary CTA FFR 03/30/2019:  1. Left Main: No significant stenosis. 2. LAD: No  significant stenosis. 3. LCX: No significant stenosis. 4. RCA: No significant stenosis. IMPRESSION: 1.  CT FFR analysis didn't show any significant stenosis.  Echocardiogram 04/23/2019 :  Normal LV systolic function with EF 55%. Left ventricle cavity is normal in size. Mild concentric hypertrophy of the left ventricle. Normal global wall motion. Doppler evidence of grade I (impaired) diastolic dysfunction, normal LAP. Calculated EF 55%. Trileaflet aortic valve. Moderate (Grade II) aortic regurgitation. Structurally normal appearing mitral valve. Mild  (Grade I) mitral regurgitation.  EKG:  EKG 03/15/2019: Normal sinus rhythm at 90 bpm, left atrial enlargement, left axis deviation, no evidence of ischemia.  Assessment:     ICD-10-CM   1. Atherosclerosis of native coronary artery of native heart without angina pectoris  I25.10 simvastatin (ZOCOR) 20 MG tablet  2. Pure hypercholesterolemia  E78.00 simvastatin (ZOCOR) 20 MG tablet    ezetimibe (ZETIA) 10 MG tablet  3. Benign essential HTN  I10    Recommendations:   ADDILYNNE OLHEISER  is a 74 y.o. female  with hypertension, hyperlipidemia, hyperglycemia, CKD stage 3, former tobacco use with moderate CAD with LAD 50-60% stenosis by coronary CTA on 03/30/2019  (not significant stenosis by FFR), moderate AR by echo in July 2020.  She does care for her elderly husband that has had heart transplant and has a lot of stress related to this. She is statin intolerant and presently on Niacin and tolerates this well. She is a former smoker that quit in 1981.    On her last office visit I had added simvastatin 20 mg twice daily and if tolerated with plans of increasing to 3 times a week.  Zetia was also added in view of markedly elevated LDL.  She now presents for follow-up.  She is tolerating simvastatin twice a week, advised her to slowly increase it as tolerated to 3 times a week or even 4 times a week.  Her vitamin D level was checked, vitamin D is within normal range.  She is also tolerating Zetia.  I will change her prescriptions to 90-day prescriptions and I will see her back in a year or sooner if problems.  She has remained stable with regard to aortic regurgitation and also coronary artery disease with recurrence of angina pectoris.  We discussed regarding elevated triglycerides and also making changes to her diet especially reducing her carbohydrate intake. I will recheck lipids in 1 month after her diet change and see her in 1 year.   With regard to hypertension, blood pressure is well controlled.      Adrian Prows, MD, [facility] 03/06/2020, 3:13 PM [facility]. [place] Office: [phone]

## 2020-03-14 ENCOUNTER — Encounter (HOSPITAL_COMMUNITY): Payer: Self-pay | Admitting: Emergency Medicine

## 2020-03-14 ENCOUNTER — Other Ambulatory Visit: Payer: Self-pay

## 2020-03-14 ENCOUNTER — Inpatient Hospital Stay (HOSPITAL_COMMUNITY)
Admission: EM | Admit: 2020-03-14 | Discharge: 2020-03-16 | DRG: 065 | Disposition: A | Discharge status: Home / Self Care | Payer: Medicare Other | Attending: Internal Medicine | Admitting: Internal Medicine

## 2020-03-14 DIAGNOSIS — F419 Anxiety disorder, unspecified: Secondary | ICD-10-CM | POA: Diagnosis present

## 2020-03-14 DIAGNOSIS — E7800 Pure hypercholesterolemia, unspecified: Secondary | ICD-10-CM

## 2020-03-14 DIAGNOSIS — F329 Major depressive disorder, single episode, unspecified: Secondary | ICD-10-CM | POA: Diagnosis present

## 2020-03-14 DIAGNOSIS — I739 Peripheral vascular disease, unspecified: Secondary | ICD-10-CM | POA: Diagnosis present

## 2020-03-14 DIAGNOSIS — K219 Gastro-esophageal reflux disease without esophagitis: Secondary | ICD-10-CM | POA: Diagnosis present

## 2020-03-14 DIAGNOSIS — E669 Obesity, unspecified: Secondary | ICD-10-CM | POA: Diagnosis present

## 2020-03-14 DIAGNOSIS — I6300 Cerebral infarction due to thrombosis of unspecified precerebral artery: Secondary | ICD-10-CM

## 2020-03-14 DIAGNOSIS — Z79899 Other long term (current) drug therapy: Secondary | ICD-10-CM

## 2020-03-14 DIAGNOSIS — I671 Cerebral aneurysm, nonruptured: Secondary | ICD-10-CM | POA: Diagnosis present

## 2020-03-14 DIAGNOSIS — Z86010 Personal history of colonic polyps: Secondary | ICD-10-CM

## 2020-03-14 DIAGNOSIS — I6381 Other cerebral infarction due to occlusion or stenosis of small artery: Secondary | ICD-10-CM | POA: Diagnosis not present

## 2020-03-14 DIAGNOSIS — J449 Chronic obstructive pulmonary disease, unspecified: Secondary | ICD-10-CM | POA: Diagnosis present

## 2020-03-14 DIAGNOSIS — I110 Hypertensive heart disease with heart failure: Secondary | ICD-10-CM | POA: Diagnosis present

## 2020-03-14 DIAGNOSIS — R531 Weakness: Secondary | ICD-10-CM

## 2020-03-14 DIAGNOSIS — E876 Hypokalemia: Secondary | ICD-10-CM | POA: Diagnosis present

## 2020-03-14 DIAGNOSIS — R7301 Impaired fasting glucose: Secondary | ICD-10-CM | POA: Diagnosis present

## 2020-03-14 DIAGNOSIS — R262 Difficulty in walking, not elsewhere classified: Secondary | ICD-10-CM | POA: Diagnosis present

## 2020-03-14 DIAGNOSIS — Z8249 Family history of ischemic heart disease and other diseases of the circulatory system: Secondary | ICD-10-CM

## 2020-03-14 DIAGNOSIS — D649 Anemia, unspecified: Secondary | ICD-10-CM

## 2020-03-14 DIAGNOSIS — Z20822 Contact with and (suspected) exposure to covid-19: Secondary | ICD-10-CM | POA: Diagnosis present

## 2020-03-14 DIAGNOSIS — Z825 Family history of asthma and other chronic lower respiratory diseases: Secondary | ICD-10-CM

## 2020-03-14 DIAGNOSIS — R471 Dysarthria and anarthria: Secondary | ICD-10-CM | POA: Diagnosis present

## 2020-03-14 DIAGNOSIS — G8194 Hemiplegia, unspecified affecting left nondominant side: Secondary | ICD-10-CM | POA: Diagnosis not present

## 2020-03-14 DIAGNOSIS — Z7951 Long term (current) use of inhaled steroids: Secondary | ICD-10-CM

## 2020-03-14 DIAGNOSIS — Z833 Family history of diabetes mellitus: Secondary | ICD-10-CM

## 2020-03-14 DIAGNOSIS — R29703 NIHSS score 3: Secondary | ICD-10-CM | POA: Diagnosis not present

## 2020-03-14 DIAGNOSIS — Z87891 Personal history of nicotine dependence: Secondary | ICD-10-CM

## 2020-03-14 DIAGNOSIS — R7303 Prediabetes: Secondary | ICD-10-CM | POA: Diagnosis present

## 2020-03-14 DIAGNOSIS — Z818 Family history of other mental and behavioral disorders: Secondary | ICD-10-CM

## 2020-03-14 DIAGNOSIS — I5032 Chronic diastolic (congestive) heart failure: Secondary | ICD-10-CM | POA: Diagnosis present

## 2020-03-14 DIAGNOSIS — R29810 Facial weakness: Secondary | ICD-10-CM | POA: Diagnosis present

## 2020-03-14 DIAGNOSIS — I639 Cerebral infarction, unspecified: Secondary | ICD-10-CM | POA: Diagnosis present

## 2020-03-14 DIAGNOSIS — R4781 Slurred speech: Secondary | ICD-10-CM | POA: Diagnosis present

## 2020-03-14 DIAGNOSIS — Z888 Allergy status to other drugs, medicaments and biological substances status: Secondary | ICD-10-CM

## 2020-03-14 DIAGNOSIS — R29701 NIHSS score 1: Secondary | ICD-10-CM | POA: Diagnosis not present

## 2020-03-14 DIAGNOSIS — I341 Nonrheumatic mitral (valve) prolapse: Secondary | ICD-10-CM | POA: Diagnosis present

## 2020-03-14 DIAGNOSIS — I6350 Cerebral infarction due to unspecified occlusion or stenosis of unspecified cerebral artery: Secondary | ICD-10-CM

## 2020-03-14 DIAGNOSIS — I10 Essential (primary) hypertension: Secondary | ICD-10-CM | POA: Diagnosis present

## 2020-03-14 DIAGNOSIS — Z6835 Body mass index (BMI) 35.0-35.9, adult: Secondary | ICD-10-CM

## 2020-03-14 DIAGNOSIS — I2510 Atherosclerotic heart disease of native coronary artery without angina pectoris: Secondary | ICD-10-CM

## 2020-03-14 DIAGNOSIS — E785 Hyperlipidemia, unspecified: Secondary | ICD-10-CM | POA: Diagnosis present

## 2020-03-14 DIAGNOSIS — M1990 Unspecified osteoarthritis, unspecified site: Secondary | ICD-10-CM | POA: Diagnosis present

## 2020-03-14 DIAGNOSIS — R8281 Pyuria: Secondary | ICD-10-CM

## 2020-03-14 HISTORY — DX: Atherosclerotic heart disease of native coronary artery without angina pectoris: I25.10

## 2020-03-14 LAB — BASIC METABOLIC PANEL
Anion gap: 12 (ref 5–15)
BUN: 20 mg/dL (ref 8–23)
CO2: 27 mmol/L (ref 22–32)
Calcium: 9 mg/dL (ref 8.9–10.3)
Chloride: 104 mmol/L (ref 98–111)
Creatinine, Ser: 1.49 mg/dL — ABNORMAL HIGH (ref 0.44–1.00)
GFR calc Af Amer: 40 mL/min — ABNORMAL LOW (ref >60)
GFR calc non Af Amer: 35 mL/min — ABNORMAL LOW (ref >60)
Glucose, Bld: 101 mg/dL — ABNORMAL HIGH (ref 70–99)
Potassium: 3.4 mmol/L — ABNORMAL LOW (ref 3.5–5.1)
Sodium: 143 mmol/L (ref 135–145)

## 2020-03-14 LAB — URINALYSIS, ROUTINE W REFLEX MICROSCOPIC
Bilirubin Urine: NEGATIVE
Glucose, UA: NEGATIVE mg/dL
Hgb urine dipstick: NEGATIVE
Ketones, ur: NEGATIVE mg/dL
Nitrite: NEGATIVE
Protein, ur: NEGATIVE mg/dL
Specific Gravity, Urine: 1.015 (ref 1.005–1.030)
pH: 5 (ref 5.0–8.0)

## 2020-03-14 LAB — CBC
HCT: 37.7 % (ref 36.0–46.0)
Hemoglobin: 11.3 g/dL — ABNORMAL LOW (ref 12.0–15.0)
MCH: 25.3 pg — ABNORMAL LOW (ref 26.0–34.0)
MCHC: 30 g/dL (ref 30.0–36.0)
MCV: 84.5 fL (ref 80.0–100.0)
Platelets: 231 10*3/uL (ref 150–400)
RBC: 4.46 MIL/uL (ref 3.87–5.11)
RDW: 16.2 % — ABNORMAL HIGH (ref 11.5–15.5)
WBC: 7.3 10*3/uL (ref 4.0–10.5)
nRBC: 0 % (ref 0.0–0.2)

## 2020-03-14 MED ORDER — SODIUM CHLORIDE 0.9% FLUSH: 3.0000 mL | Freq: Once | INTRAVENOUS | Status: DC

## 2020-03-14 NOTE — ED Notes (Signed)
Margreta Journey, daughter, [phone] would like an update when available

## 2020-03-14 NOTE — ED Triage Notes (Signed)
Patient arrives to ED with complaints of weakness and lethargy after giving blood yesterday. Patient states that she has sense felt off and still weak today. Patient states she had an episode of not able to get the words out of her mouth and another time with left arm weakness both yesterday and today. Both have resolved. NIH 0.

## 2020-03-15 ENCOUNTER — Inpatient Hospital Stay (HOSPITAL_COMMUNITY): Payer: Medicare Other

## 2020-03-15 ENCOUNTER — Emergency Department (HOSPITAL_COMMUNITY): Payer: Medicare Other

## 2020-03-15 ENCOUNTER — Encounter (HOSPITAL_COMMUNITY): Payer: Self-pay | Admitting: Internal Medicine

## 2020-03-15 DIAGNOSIS — I671 Cerebral aneurysm, nonruptured: Secondary | ICD-10-CM | POA: Diagnosis present

## 2020-03-15 DIAGNOSIS — I6381 Other cerebral infarction due to occlusion or stenosis of small artery: Secondary | ICD-10-CM | POA: Diagnosis present

## 2020-03-15 DIAGNOSIS — I6389 Other cerebral infarction: Secondary | ICD-10-CM | POA: Diagnosis not present

## 2020-03-15 DIAGNOSIS — I6350 Cerebral infarction due to unspecified occlusion or stenosis of unspecified cerebral artery: Secondary | ICD-10-CM

## 2020-03-15 DIAGNOSIS — R29703 NIHSS score 3: Secondary | ICD-10-CM | POA: Diagnosis not present

## 2020-03-15 DIAGNOSIS — Z6835 Body mass index (BMI) 35.0-35.9, adult: Secondary | ICD-10-CM

## 2020-03-15 DIAGNOSIS — Z87891 Personal history of nicotine dependence: Secondary | ICD-10-CM | POA: Diagnosis not present

## 2020-03-15 DIAGNOSIS — F329 Major depressive disorder, single episode, unspecified: Secondary | ICD-10-CM | POA: Diagnosis present

## 2020-03-15 DIAGNOSIS — Z20822 Contact with and (suspected) exposure to covid-19: Secondary | ICD-10-CM | POA: Diagnosis present

## 2020-03-15 DIAGNOSIS — R7301 Impaired fasting glucose: Secondary | ICD-10-CM | POA: Diagnosis not present

## 2020-03-15 DIAGNOSIS — I6300 Cerebral infarction due to thrombosis of unspecified precerebral artery: Secondary | ICD-10-CM | POA: Diagnosis not present

## 2020-03-15 DIAGNOSIS — I739 Peripheral vascular disease, unspecified: Secondary | ICD-10-CM | POA: Diagnosis present

## 2020-03-15 DIAGNOSIS — E669 Obesity, unspecified: Secondary | ICD-10-CM | POA: Diagnosis present

## 2020-03-15 DIAGNOSIS — Z86010 Personal history of colonic polyps: Secondary | ICD-10-CM | POA: Diagnosis not present

## 2020-03-15 DIAGNOSIS — I5032 Chronic diastolic (congestive) heart failure: Secondary | ICD-10-CM | POA: Diagnosis present

## 2020-03-15 DIAGNOSIS — G8194 Hemiplegia, unspecified affecting left nondominant side: Secondary | ICD-10-CM | POA: Diagnosis present

## 2020-03-15 DIAGNOSIS — K219 Gastro-esophageal reflux disease without esophagitis: Secondary | ICD-10-CM | POA: Diagnosis present

## 2020-03-15 DIAGNOSIS — J449 Chronic obstructive pulmonary disease, unspecified: Secondary | ICD-10-CM | POA: Diagnosis present

## 2020-03-15 DIAGNOSIS — E876 Hypokalemia: Secondary | ICD-10-CM | POA: Diagnosis present

## 2020-03-15 DIAGNOSIS — R29701 NIHSS score 1: Secondary | ICD-10-CM | POA: Diagnosis not present

## 2020-03-15 DIAGNOSIS — R4781 Slurred speech: Secondary | ICD-10-CM | POA: Diagnosis present

## 2020-03-15 DIAGNOSIS — I110 Hypertensive heart disease with heart failure: Secondary | ICD-10-CM | POA: Diagnosis present

## 2020-03-15 DIAGNOSIS — I10 Essential (primary) hypertension: Secondary | ICD-10-CM | POA: Diagnosis not present

## 2020-03-15 DIAGNOSIS — F419 Anxiety disorder, unspecified: Secondary | ICD-10-CM | POA: Diagnosis present

## 2020-03-15 DIAGNOSIS — I639 Cerebral infarction, unspecified: Secondary | ICD-10-CM | POA: Diagnosis present

## 2020-03-15 DIAGNOSIS — R7303 Prediabetes: Secondary | ICD-10-CM | POA: Diagnosis present

## 2020-03-15 DIAGNOSIS — E785 Hyperlipidemia, unspecified: Secondary | ICD-10-CM | POA: Diagnosis present

## 2020-03-15 DIAGNOSIS — R471 Dysarthria and anarthria: Secondary | ICD-10-CM | POA: Diagnosis present

## 2020-03-15 DIAGNOSIS — R262 Difficulty in walking, not elsewhere classified: Secondary | ICD-10-CM | POA: Diagnosis present

## 2020-03-15 DIAGNOSIS — M1990 Unspecified osteoarthritis, unspecified site: Secondary | ICD-10-CM | POA: Diagnosis present

## 2020-03-15 DIAGNOSIS — E782 Mixed hyperlipidemia: Secondary | ICD-10-CM | POA: Diagnosis not present

## 2020-03-15 LAB — RAPID URINE DRUG SCREEN, HOSP PERFORMED
Amphetamines: NOT DETECTED
Barbiturates: NOT DETECTED
Benzodiazepines: NOT DETECTED
Cocaine: NOT DETECTED
Opiates: NOT DETECTED
Tetrahydrocannabinol: NOT DETECTED

## 2020-03-15 LAB — HEPATIC FUNCTION PANEL
ALT: 18 U/L (ref 0–44)
AST: 31 U/L (ref 15–41)
Albumin: 3.9 g/dL (ref 3.5–5.0)
Alkaline Phosphatase: 90 U/L (ref 38–126)
Bilirubin, Direct: 0.3 mg/dL — ABNORMAL HIGH (ref 0.0–0.2)
Indirect Bilirubin: 0.3 mg/dL (ref 0.3–0.9)
Total Bilirubin: 0.6 mg/dL (ref 0.3–1.2)
Total Protein: 6.6 g/dL (ref 6.5–8.1)

## 2020-03-15 LAB — ETHANOL: Alcohol, Ethyl (B): <10 mg/dL (ref <10)

## 2020-03-15 LAB — ECHOCARDIOGRAM COMPLETE
Height: 62 in
Weight: 3104 oz

## 2020-03-15 LAB — TSH: TSH: 2.5 u[IU]/mL (ref 0.350–4.500)

## 2020-03-15 LAB — PROTIME-INR
INR: 1 (ref 0.8–1.2)
Prothrombin Time: 12.4 seconds (ref 11.4–15.2)

## 2020-03-15 LAB — LIPID PANEL
Cholesterol: 160 mg/dL (ref 0–200)
HDL: 63 mg/dL (ref >40)
LDL Cholesterol: 77 mg/dL (ref 0–99)
Total CHOL/HDL Ratio: 2.5 RATIO
Triglycerides: 101 mg/dL (ref <150)
VLDL: 20 mg/dL (ref 0–40)

## 2020-03-15 LAB — APTT: aPTT: 20 seconds — ABNORMAL LOW (ref 24–36)

## 2020-03-15 LAB — HEMOGLOBIN A1C
Hgb A1c MFr Bld: 6.1 % — ABNORMAL HIGH (ref 4.8–5.6)
Mean Plasma Glucose: 128.37 mg/dL

## 2020-03-15 LAB — SARS CORONAVIRUS 2 BY RT PCR (HOSPITAL ORDER, PERFORMED IN ~~LOC~~ HOSPITAL LAB): SARS Coronavirus 2: NEGATIVE

## 2020-03-15 LAB — CBG MONITORING, ED
Glucose-Capillary: 93 mg/dL (ref 70–99)
Glucose-Capillary: 86 mg/dL (ref 70–99)

## 2020-03-15 LAB — GLUCOSE, CAPILLARY: Glucose-Capillary: 146 mg/dL — ABNORMAL HIGH (ref 70–99)

## 2020-03-15 MED ORDER — STUDY - PACIFIC (STROKE) - BAY 2433334 (GREEN BOTTLE) 5, 15, 25MG OR PLACEBO TABLET (PI-SETHI)
1.0000 | ORAL_TABLET | Freq: Every day | ORAL | Status: DC
  Administered 2020-03-15 – 2020-03-16 (×2): 1 via ORAL
  Filled 2020-03-15 (×3): qty 1

## 2020-03-15 MED ORDER — POTASSIUM CHLORIDE CRYS ER 20 MEQ PO TBCR
40.0000 meq | EXTENDED_RELEASE_TABLET | Freq: Once | ORAL | Status: AC
  Administered 2020-03-15: 40 meq via ORAL
  Filled 2020-03-15: qty 2

## 2020-03-15 MED ORDER — PAROXETINE HCL 20 MG PO TABS
40.0000 mg | ORAL_TABLET | Freq: Every evening | ORAL | Status: DC
  Administered 2020-03-15: 40 mg via ORAL
  Filled 2020-03-15 (×2): qty 2

## 2020-03-15 MED ORDER — ACETAMINOPHEN 650 MG RE SUPP: 650.0000 mg | RECTAL | Status: DC | PRN

## 2020-03-15 MED ORDER — INSULIN ASPART 100 UNIT/ML ~~LOC~~ SOLN: 0.0000 [IU] | Freq: Three times a day (TID) | SUBCUTANEOUS | Status: DC

## 2020-03-15 MED ORDER — PANTOPRAZOLE SODIUM 40 MG PO TBEC
40.0000 mg | DELAYED_RELEASE_TABLET | Freq: Every evening | ORAL | Status: DC
  Administered 2020-03-15: 40 mg via ORAL
  Filled 2020-03-15: qty 1

## 2020-03-15 MED ORDER — SODIUM CHLORIDE 0.9 % IV SOLN: INTRAVENOUS | Status: DC

## 2020-03-15 MED ORDER — STUDY - PACIFIC (STROKE) - BAY 2433334 (BLUE BOTTLE) 5, 15, 25MG OR PLACEBO TABLET (PI-SETHI)
1.0000 | ORAL_TABLET | Freq: Every day | ORAL | Status: DC
  Administered 2020-03-15 – 2020-03-16 (×2): 1 via ORAL
  Filled 2020-03-15 (×3): qty 1

## 2020-03-15 MED ORDER — EZETIMIBE 10 MG PO TABS
10.0000 mg | ORAL_TABLET | Freq: Every day | ORAL | Status: DC
  Administered 2020-03-15: 10 mg via ORAL
  Filled 2020-03-15: qty 1

## 2020-03-15 MED ORDER — GABAPENTIN 300 MG PO CAPS
300.0000 mg | ORAL_CAPSULE | Freq: Two times a day (BID) | ORAL | Status: DC
  Administered 2020-03-15 – 2020-03-16 (×3): 300 mg via ORAL
  Filled 2020-03-15 (×3): qty 1

## 2020-03-15 MED ORDER — CLOPIDOGREL BISULFATE 75 MG PO TABS
75.0000 mg | ORAL_TABLET | Freq: Every day | ORAL | Status: DC
  Administered 2020-03-15 – 2020-03-16 (×2): 75 mg via ORAL
  Filled 2020-03-15 (×2): qty 1

## 2020-03-15 MED ORDER — SIMVASTATIN 20 MG PO TABS
20.0000 mg | ORAL_TABLET | ORAL | Status: DC
  Administered 2020-03-16: 20 mg via ORAL
  Filled 2020-03-15: qty 1

## 2020-03-15 MED ORDER — INSULIN ASPART 100 UNIT/ML ~~LOC~~ SOLN: 0.0000 [IU] | Freq: Every day | SUBCUTANEOUS | Status: DC

## 2020-03-15 MED ORDER — ACETAMINOPHEN 160 MG/5ML PO SOLN: 650.0000 mg | ORAL | Status: DC | PRN

## 2020-03-15 MED ORDER — NIACIN ER (ANTIHYPERLIPIDEMIC) 500 MG PO TBCR
1000.0000 mg | EXTENDED_RELEASE_TABLET | Freq: Every day | ORAL | Status: DC
  Administered 2020-03-15: 1000 mg via ORAL
  Filled 2020-03-15 (×3): qty 2

## 2020-03-15 MED ORDER — ONDANSETRON HCL 4 MG/2ML IJ SOLN: 4.0000 mg | Freq: Four times a day (QID) | INTRAMUSCULAR | Status: DC | PRN

## 2020-03-15 MED ORDER — ALPRAZOLAM 0.25 MG PO TABS: 0.2500 mg | ORAL_TABLET | Freq: Three times a day (TID) | ORAL | Status: DC | PRN

## 2020-03-15 MED ORDER — ALBUTEROL SULFATE (2.5 MG/3ML) 0.083% IN NEBU: 2.5000 mg | INHALATION_SOLUTION | Freq: Four times a day (QID) | RESPIRATORY_TRACT | Status: DC | PRN

## 2020-03-15 MED ORDER — ASPIRIN 325 MG PO TABS: 325.0000 mg | ORAL_TABLET | Freq: Every day | ORAL | Status: DC

## 2020-03-15 MED ORDER — MOMETASONE FURO-FORMOTEROL FUM 200-5 MCG/ACT IN AERO
2.0000 | INHALATION_SPRAY | Freq: Two times a day (BID) | RESPIRATORY_TRACT | Status: DC
  Administered 2020-03-15 – 2020-03-16 (×2): 2 via RESPIRATORY_TRACT
  Filled 2020-03-15: qty 8.8

## 2020-03-15 MED ORDER — ASPIRIN EC 81 MG PO TBEC
81.0000 mg | DELAYED_RELEASE_TABLET | Freq: Every day | ORAL | Status: DC
  Administered 2020-03-15 – 2020-03-16 (×2): 81 mg via ORAL
  Filled 2020-03-15 (×2): qty 1

## 2020-03-15 MED ORDER — ENOXAPARIN SODIUM 40 MG/0.4ML ~~LOC~~ SOLN
40.0000 mg | Freq: Every day | SUBCUTANEOUS | Status: DC
  Administered 2020-03-15: 40 mg via SUBCUTANEOUS
  Filled 2020-03-15: qty 0.4

## 2020-03-15 MED ORDER — LORATADINE 10 MG PO TABS
10.0000 mg | ORAL_TABLET | Freq: Every day | ORAL | Status: DC
  Administered 2020-03-15 – 2020-03-16 (×2): 10 mg via ORAL
  Filled 2020-03-15 (×2): qty 1

## 2020-03-15 MED ORDER — SENNOSIDES-DOCUSATE SODIUM 8.6-50 MG PO TABS: 1.0000 | ORAL_TABLET | Freq: Every evening | ORAL | Status: DC | PRN

## 2020-03-15 MED ORDER — STROKE: EARLY STAGES OF RECOVERY BOOK: Freq: Once | Status: DC

## 2020-03-15 MED ORDER — BUPROPION HCL ER (XL) 150 MG PO TB24
150.0000 mg | ORAL_TABLET | Freq: Every day | ORAL | Status: DC
  Administered 2020-03-15 – 2020-03-16 (×2): 150 mg via ORAL
  Filled 2020-03-15 (×2): qty 1

## 2020-03-15 MED ORDER — ASPIRIN 300 MG RE SUPP: 300.0000 mg | Freq: Every day | RECTAL | Status: DC

## 2020-03-15 MED ORDER — AEROCHAMBER PLUS FLO-VU LARGE MISC
Status: AC
  Administered 2020-03-15: 1
  Filled 2020-03-15: qty 1

## 2020-03-15 MED ORDER — ACETAMINOPHEN 325 MG PO TABS: 650.0000 mg | ORAL_TABLET | ORAL | Status: DC | PRN

## 2020-03-15 MED ORDER — TRAZODONE HCL 150 MG PO TABS
150.0000 mg | ORAL_TABLET | Freq: Every day | ORAL | Status: DC
  Administered 2020-03-15: 150 mg via ORAL
  Filled 2020-03-15: qty 1

## 2020-03-15 NOTE — ED Notes (Signed)
Transported to CT 

## 2020-03-15 NOTE — ED Provider Notes (Signed)
[facility] EMERGENCY DEPARTMENT Provider Note   CSN: [id number] Arrival date & time: 03/14/20  1306   History Chief Complaint  Patient presents with  . Weakness    Shelley Spencer is a 74 y.o. female.  The history is provided by the patient.  Weakness She has history of hypertension, hyperlipidemia, prediabetes and comes in because of feeling unsteady.  She donated blood yesterday.  When she got home, she noted that she felt generally off balance although she did not fall.  She also noted that she was slurring her words when she tried to speak.  She has not noticed any focal weakness or numbness.  Symptoms have improved somewhat but have not gone back to baseline.  Symptom started at about 3:30 PM on 6/21.  She denies headache, nausea, vomiting.  There has been no chest pain.  She has had applicable to ambulate, but generally feels off balance.  Past Medical History:  Diagnosis Date  . Anemia   . Anxiety   . Arthritis   . Asthma   . Bicornuate uterus    double cervix  . Colon polyp   . Depression   . Deviated septum   . DJD (degenerative joint disease)    L-Spine  . Fibroids   . GERD (gastroesophageal reflux disease)   . Hematuria   . Hiatal hernia   . HNP (herniated nucleus pulposus)    lower back  . Hyperlipidemia   . Hypertension   . MVP (mitral valve prolapse)    pt denies, pt states she had a Echo ?2008 and it did not show MVP  . Pre-diabetes   . Retinal tear 2005   Small right retinal tear  . Shingles 1980's   on her waist    Patient Active Problem List   Diagnosis Date Noted  . OA (osteoarthritis) of knee 05/17/2019  . Herniated cervical disc without myelopathy 09/05/2017  . Bronchitis 04/16/2017  . Microcytosis 11/12/2013  . Screening for depression 11/12/2013  . Obesity 10/29/2012  . Underimmunization status 06/02/2012  . Herpes zona 03/01/2012  . LBP (low back pain) 02/27/2012  . Atypical chest pain 10/31/2011  .  Overweight(278.02) 10/31/2011  . HLD (hyperlipidemia)   . Hypertension   . Fatigue 10/30/2011  . Laryngeal pain 09/05/2011  . Benign neoplasm of colon 08/14/2011  . Acute bronchitis 07/08/2011  . Allergic rhinitis, seasonal 11/30/2009  . Airway hyperreactivity 11/30/2009  . DDD (degenerative disc disease) 11/30/2009  . Clinical depression 11/30/2009  . Benign essential HTN 11/30/2009  . Acid reflux 11/30/2009  . Other and unspecified general psychiatric examination 11/30/2009  . Heart murmur, systolic 61/44/3154  . Elevated fasting blood sugar 11/30/2009    Past Surgical History:  Procedure Laterality Date  . ANTERIOR CERVICAL DECOMP/DISCECTOMY FUSION N/A 09/05/2017   Procedure: Cervical four-five, Cervical five-six, Cervical six-seven Anterior cervical decompression/discectomy/fusion;  Surgeon: Erline Levine, MD;  Location: [facility];  Service: Neurosurgery;  Laterality: N/A;  . bone spur surgery Bilateral    x 3  . CARDIOVASCULAR STRESS TEST  02/08/2014  . COLONOSCOPY    . CYST EXCISION Left 09/29/2018   Procedure: LEFT MIDDLE FINGER CYST REMOVAL AND DEBRIDEMENT OF DISTAL INTERPHALANGEAL JOINT;  Surgeon: Daryll Brod, MD;  Location: [facility];  Service: Orthopedics;  Laterality: Left;  . FINGER SURGERY Left    left third finger mucoid cyst removed  . FOOT NEUROMA SURGERY Bilateral   . HYSTEROSCOPY     D&C PMB Endo Cx Polyp  .  KNEE ARTHROSCOPY WITH MENISCAL REPAIR Right 2016   --Flossie Dibble, MD  . LAPAROSCOPIC HYSTERECTOMY  7/209/10   R-TLH/BSO  Fibroids, BAck Pain, Adenomyosis  . LUMBAR FUSION    . removal vaginal septum    . TOTAL KNEE ARTHROPLASTY Right 05/17/2019   Procedure: TOTAL KNEE ARTHROPLASTY;  Surgeon: Gaynelle Arabian, MD;  Location: [facility];  Service: Orthopedics;  Laterality: Right;  57min  . TUBAL LIGATION       OB History    Gravida  3   Para  2   Term      Preterm      AB  1   Living  2     SAB  1   TAB      Ectopic      Multiple       Live Births              Family History  Problem Relation Age of Onset  . Heart failure Father        Possibly related to EtOH  . Alcohol abuse Father   . Emphysema Father   . Emphysema Mother   . Heart failure Mother        Possibly related to EtOH  . Diabetes Mother   . Alcohol abuse Mother   . Valvular heart disease Brother   . Bipolar disorder Brother   . Crohn's disease Daughter   . Thyroid disease Daughter   . Colon cancer Neg Hx   . Stomach cancer Neg Hx   . Esophageal cancer Neg Hx   . Rectal cancer Neg Hx   . Liver cancer Neg Hx     Social History   Tobacco Use  . Smoking status: Former Smoker    Packs/day: 1.00    Years: 20.00    Pack years: 20.00    Types: Cigarettes    Quit date: 09/24/1979    Years since quitting: 40.5  . Smokeless tobacco: Never Used  Vaping Use  . Vaping Use: Never used  Substance Use Topics  . Alcohol use: Not Currently    Alcohol/week: 1.0 standard drink    Types: 1 Standard drinks or equivalent per week    Comment: rarely  . Drug use: No    Home Medications Prior to Admission medications   Medication Sig Start Date End Date Taking? Authorizing Provider  albuterol (VENTOLIN HFA) 108 (90 Base) MCG/ACT inhaler Inhale 1-2 puffs into the lungs every 6 (six) hours as needed for wheezing or shortness of breath.     [provider]  ALPRAZolam Duanne Moron) 0.25 MG tablet Take 0.25 mg by mouth 3 (three) times daily as needed. 10/28/19   [provider]  budesonide-formoterol (SYMBICORT) 160-4.5 MCG/ACT inhaler Inhale 2 puffs into the lungs daily.     [provider]  buPROPion (WELLBUTRIN XL) 150 MG 24 hr tablet Take 150 mg by mouth daily.    [provider]  CALCIUM-VITAMIN D PO Take 1 tablet by mouth daily.    [provider]  cetirizine (ZYRTEC) 10 MG tablet Take 10 mg by mouth daily.    [provider]  docusate sodium (COLACE) 100 MG capsule Take 1 capsule (100 mg total) by  mouth 2 (two) times daily. Patient taking differently: Take 100 mg by mouth daily as needed for moderate constipation.  09/06/17   Newman Pies, MD  doxazosin (CARDURA) 4 MG tablet Take 4 mg by mouth daily.     [provider]  ezetimibe (  ZETIA) 10 MG tablet Take 1 tablet (10 mg total) by mouth daily after supper. 03/06/20 03/01/21  Adrian Prows, MD  furosemide (LASIX) 20 MG tablet Take 20 mg by mouth daily.    [provider]  gabapentin (NEURONTIN) 300 MG capsule Take 1 capsule (300 mg total) by mouth 3 (three) times daily. Take a 300 mg capsule three times a day for two weeks following surgery.Then take a 300 mg capsule two times a day for two weeks. Then take a 300 mg capsule once a day for two weeks. Then discontinue the Gabapentin. 05/18/19   Maurice March, PA-C  Multiple Vitamins-Minerals (MULTIVITAMIN PO) Take 1 tablet by mouth daily.    [provider]  niacin (NIASPAN) 1000 MG CR tablet Take 1,000 mg by mouth at bedtime.    [provider]  nitroGLYCERIN (NITROSTAT) 0.4 MG SL tablet Place 1 tablet (0.4 mg total) under the tongue every 5 (five) minutes as needed for chest pain. 03/15/19 12/27/19  Miquel Dunn, NP  olmesartan (BENICAR) 40 MG tablet Take 40 mg by mouth every evening.    [provider]  pantoprazole (PROTONIX) 40 MG tablet Take 1 tablet (40 mg total) by mouth 2 (two) times daily. Patient taking differently: Take 40 mg by mouth every evening.  10/16/16   Mauri Pole, MD  PARoxetine (PAXIL) 40 MG tablet Take 40 mg by mouth every evening.     [provider]  potassium chloride SA (K-DUR) 20 MEQ tablet Take 20 mEq by mouth daily.     [provider]  simvastatin (ZOCOR) 20 MG tablet Take 1 tablet (20 mg total) by mouth daily after supper. Tue and Thur for 2 weeks and if tolerated three times a week. 03/06/20 03/01/21  Adrian Prows, MD  traZODone (DESYREL) 150 MG tablet Take 150 mg by mouth at bedtime.     [provider]  vitamin B-12 (CYANOCOBALAMIN) 1000 MCG tablet Take 1,000 mcg by mouth daily.    [provider]    Allergies    Amlodipine, Fenofibrate, Omeprazole, Simvastatin, Singulair [montelukast sodium], and Statins  Review of Systems   Review of Systems  Neurological: Positive for weakness.  All other systems reviewed and are negative.   Physical Exam Updated Vital Signs BP (!) 150/57 (BP Location: Right Arm)   Pulse 70   Temp (!) 97.4 F (36.3 C) (Oral)   Resp 20   Ht 5\' 2"  (1.575 m)   Wt 88 kg   LMP  (LMP Unknown)   SpO2 96%   BMI 35.48 kg/m   Physical Exam Vitals and nursing note reviewed.   74 year old female, resting comfortably and in no acute distress. Vital signs are significant for elevated blood pressure. Oxygen saturation is 96%, which is normal. Head is normocephalic and atraumatic. PERRLA, EOMI. Oropharynx is clear. Neck is nontender and supple without adenopathy or JVD.  There are no carotid bruits. Back is nontender and there is no CVA tenderness. Lungs are clear without rales, wheezes, or rhonchi. Chest is nontender. Heart has regular rate and rhythm without murmur. Abdomen is soft, flat, nontender without masses or hepatosplenomegaly and peristalsis is normoactive. Extremities have trace edema, full range of motion is present. Skin is warm and dry without rash. Neurologic: She is awake, alert, oriented x3.  Speech is fluent with very mild dysarthria.  There is a mild left central facial droop.  Tongue protrudes in the midline.  Normal facial sensation.  There is no  pronator drift.  There is very mild weakness on the left side with strength 4.5/5 in the left arm and left leg and 5/5 in the right leg.  There are no sensory deficits.  There is no extinction on double simultaneous stimulation.  Finger-to-nose testing is mildly ataxic on the left, normal on the right.  On Romberg testing, she is generally unsteady, but does not fall and  does not tend to list in any 1 direction..  ED Results / Procedures / Treatments   Labs (all labs ordered are listed, but only abnormal results are displayed) Labs Reviewed  BASIC METABOLIC PANEL - Abnormal; Notable for the following components:      Result Value   Potassium 3.4 (*)    Glucose, Bld 101 (*)    Creatinine, Ser 1.49 (*)    GFR calc non Af Amer 35 (*)    GFR calc Af Amer 40 (*)    All other components within normal limits  CBC - Abnormal; Notable for the following components:   Hemoglobin 11.3 (*)    MCH 25.3 (*)    RDW 16.2 (*)    All other components within normal limits  URINALYSIS, ROUTINE W REFLEX MICROSCOPIC - Abnormal; Notable for the following components:   Leukocytes,Ua MODERATE (*)    Bacteria, UA FEW (*)    All other components within normal limits  ETHANOL  PROTIME-INR  APTT  RAPID URINE DRUG SCREEN, HOSP PERFORMED  HEPATIC FUNCTION PANEL  CBG MONITORING, ED    EKG EKG Interpretation  Date/Time:  Tuesday March 14 2020 13:51:19 EDT Ventricular Rate:  79 PR Interval:  158 QRS Duration: 78 QT Interval:  402 QTC Calculation: 460 R Axis:   -9 Text Interpretation: Normal sinus rhythm Minimal voltage criteria for LVH, may be normal variant ( R in aVL ) Inferior infarct , age undetermined Anterior infarct , age undetermined Abnormal ECG When compared with ECG of 09/25/2018, No significant change was found Confirmed by Delora Fuel ([id number]) on 03/14/2020 11:42:54 PM   Radiology CT HEAD WO CONTRAST  Result Date: 03/15/2020 CLINICAL DATA:  Weakness and lethargy after blood donation yesterday. Intermittent episodes of possible expressive aphasia and left arm weakness. EXAM: CT HEAD WITHOUT CONTRAST TECHNIQUE: Contiguous axial images were obtained from the base of the skull through the vertex without intravenous contrast. COMPARISON:  None. FINDINGS: Brain: No evidence of acute infarction, hemorrhage, hydrocephalus, extra-axial collection or mass lesion/mass  effect. Symmetric prominence of the ventricles, cisterns and sulci compatible with parenchymal volume loss. Patchy areas of white matter hypoattenuation are most compatible with chronic microvascular angiopathy. Vascular: Atherosclerotic calcification of the carotid siphons. No hyperdense vessel. Skull: No calvarial fracture or suspicious osseous lesion. No scalp swelling or hematoma. Sinuses/Orbits: Paranasal sinuses and mastoid air cells are predominantly clear. Included orbital structures are unremarkable. Other: None IMPRESSION: 1. No acute intracranial findings. If there is persisting clinical concern for infarct, MRI is more sensitive and specific for early changes of ischemia. 2. Mild chronic microvascular angiopathy and parenchymal volume loss. Electronically Signed   By: Lovena Le M.D.   On: 03/15/2020 01:32   MR BRAIN WO CONTRAST  Result Date: 03/15/2020 CLINICAL DATA:  Episode of abnormal speech with left arm weakness. Weakness and lethargy after getting blood. EXAM: MRI HEAD WITHOUT CONTRAST TECHNIQUE: Multiplanar, multiecho pulse sequences of the brain and surrounding structures were obtained without intravenous contrast. COMPARISON:  CT head without contrast 03/15/2020 FINDINGS: Brain: The diffusion-weighted images demonstrate an acute/subacute nonhemorrhagic right paramedian pontine  infarct no additional infarcts are present. Subtle T2 signal changes are associated. Periventricular and scattered subcortical T2 hyperintensities are moderately advanced for age. T2 signal changes are present within the corona radiata bilaterally. The ventricles are of normal size. No significant extraaxial fluid collection is present. The brainstem and cerebellum are within normal limits. Vascular: Flow is present in the major intracranial arteries. Skull and upper cervical spine: The craniocervical junction is normal. Cervical spine hardware is noted at C4. Upper cervical spine is otherwise within normal limits.  Marrow signal is unremarkable. Sinuses/Orbits: The paranasal sinuses and mastoid air cells are clear. The globes and orbits are within normal limits. IMPRESSION: 1. Acute/subacute nonhemorrhagic linear right paramedian pontine infarct. This represents a basilar perforator infarct. 2. Periventricular and scattered subcortical T2 hyperintensities bilaterally are moderately advanced for age. This likely reflects the sequela of chronic microvascular ischemia. These results were called by telephone at the time of interpretation on 03/15/2020 at 5:30 am to provider [facility] , who verbally acknowledged these results. Electronically Signed   By: San Morelle M.D.   On: 03/15/2020 05:30    Procedures Procedures   Medications Ordered in ED Medications  sodium chloride flush (NS) 0.9 % injection 3 mL (has no administration in time range)    ED Course  I have reviewed the triage vital signs and the nursing notes.  Pertinent labs & imaging results that were available during my care of the patient were reviewed by me and considered in my medical decision making (see chart for details).  MDM Rules/Calculators/A&P Probable stroke with left facial droop, left weakness, mildly dysarthric speech, mild ataxia.  She is well outside the window for code stroke activation and outside the treatment window for thrombolytic therapy or endovascular therapy.  ECG shows no acute changes.  Labs show mild anemia and mild renal insufficiency and mild hypokalemia.  Renal insufficiency is slightly worse than baseline.  She is given a dose of oral potassium.  Urinalysis has 11-20 WBCs, few bacteria.  Will send for culture, but I do not feel that she clinically has a urinary tract infection.  She will be sent for CT of head and, if it does not show any acute changes, will follow up with MRI of the brain.  MRI shows right pontine infarct which is consistent with the clinical findings.  Hepatic function panel is normal.  Case  discussed with Dr. Alcario Drought of [facility], who agrees to admit the patient.  Case is discussed with [facility] neurology service who agrees to see the patient in consultation.  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

## 2020-03-15 NOTE — Consult Note (Signed)
Neurology Consultation Reason for Consult: Stroke Referring Physician: Roxanne Mins, D  CC: Difficulty walking  History is obtained from: Patient  HPI: Shelley Spencer is a 74 y.o. female with a history of hypertension, hyperlipidemia, prediabetes who presents with difficulty walking left-sided weakness started about 3:30 PM on 6/21.  She states that she had gone to donate blood, and when she returned she noticed she was off balance.  She was also slurring.  Due to the symptoms not improving, she sought care in the emergency department yesterday.  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   LKW: 3:30 PM 6/21 tpa given?: no, out of window   ROS: A 14 point ROS was performed and is negative except as noted in the HPI.   Past Medical History:  Diagnosis Date  . Anemia   . Anxiety   . Arthritis   . Asthma   . Bicornuate uterus    double cervix  . Colon polyp   . Depression   . Deviated septum   . DJD (degenerative joint disease)    L-Spine  . Fibroids   . GERD (gastroesophageal reflux disease)   . Hematuria   . Hiatal hernia   . HNP (herniated nucleus pulposus)    lower back  . Hyperlipidemia   . Hypertension   . MVP (mitral valve prolapse)    pt denies, pt states she had a Echo ?2008 and it did not show MVP  . Pre-diabetes   . Retinal tear 2005   Small right retinal tear  . Shingles 1980's   on her waist     Family History  Problem Relation Age of Onset  . Heart failure Father        Possibly related to EtOH  . Alcohol abuse Father   . Emphysema Father   . Emphysema Mother   . Heart failure Mother        Possibly related to EtOH  . Diabetes Mother   . Alcohol abuse Mother   . Valvular heart disease Brother   . Bipolar disorder Brother   . Crohn's disease Daughter   . Thyroid disease Daughter   . Colon cancer Neg Hx   . Stomach cancer Neg Hx   . Esophageal cancer Neg Hx   . Rectal cancer Neg Hx   . Liver cancer  Neg Hx      Social History:  reports that she quit smoking about 40 years ago. Her smoking use included cigarettes. She has a 20.00 pack-year smoking history. She has never used smokeless tobacco. She reports previous alcohol use of about 1.0 standard drink of alcohol per week. She reports that she does not use drugs.   Exam: Current vital signs: BP (!) 119/57   Pulse 77   Temp (!) 97.4 F (36.3 C) (Oral)   Resp 20   Ht 5\' 2"  (1.575 m)   Wt 88 kg   LMP  (LMP Unknown)   SpO2 93%   BMI 35.48 kg/m  Vital signs in last 24 hours: Temp:  [97.4 F (36.3 C)-97.7 F (36.5 C)] 97.4 F (36.3 C) (06/23 0046) Pulse Rate:  [69-80] 77 (06/23 0316) Resp:  [16-20] 20 (06/23 0046) BP: (108-150)/(55-85) 119/57 (06/23 0316) SpO2:  [93 %-99 %] 93 % (06/23 0316) Weight:  [88 kg-88.5 kg] 88 kg (06/23 0046)   Physical Exam  Constitutional: Appears well-developed and well-nourished.  Psych: Affect appropriate to situation Eyes: No scleral  injection HENT: No OP obstrucion MSK: no joint deformities.  Cardiovascular: Normal rate and regular rhythm.  Respiratory: Effort normal, non-labored breathing GI: Soft.  No distension. There is no tenderness.  Skin: WDI  Neuro: Mental Status: Patient is awake, alert, oriented to person, place, month, year, and situation. Patient is able to give a clear and coherent history. No signs of aphasia or neglect Cranial Nerves: II: Visual Fields are full. Pupils are equal, round, and reactive to light.   III,IV, VI: EOMI without ptosis or diploplia.  V: Facial sensation is symmetric to temperature VII: Facial movement with mild left facial weakness VIII: hearing is intact to voice X: Uvula elevates symmetrically XI: Shoulder shrug is symmetric. XII: tongue is midline without atrophy or fasciculations.  Motor: Tone is normal. Bulk is normal. 5/5 strength was present on the right side, she has 4/5 weakness of the left arm and 5 -/5 weakness of the left  leg Sensory: Sensation is symmetric to light touch and temperature in the arms and legs. Cerebellar: FNF are intact on the right, consistent with weakness on the left      I have reviewed labs in epic and the results pertinent to this consultation are: Borderline creatinine at 1.49  I have reviewed the images obtained: MRI brain-basilar perforator infarct  Impression: 74 year old female with small ischemic stroke.  This is most likely due to her small vessel risk factors.  She is being admitted for physical therapy and secondary risk factor modification.  Recommendations: - HgbA1c, fasting lipid panel -  MRA  of the brain without contrast, MRA neck - Frequent neuro checks - Echocardiogram - Prophylactic therapy-Antiplatelet med: Aspirin -81 mg, Plavix 75 mg after 300 mg load - Risk factor modification - Telemetry monitoring - PT consult, OT consult, Speech consult - Stroke team to follow    Roland Rack, MD [facility] [phone]  If 7pm- 7am, please page neurology on call as listed in [facility].

## 2020-03-15 NOTE — H&P (Signed)
History and Physical    Shelley Spencer KDX:[id number] DOB: [birth year] DOA: 03/14/2020  PCP: Crist Infante, MD Consultants:  Einar Gip- cardiology; Aluisio - orthopedics;  Patient coming from:  Home - lives with husband, son, and 36 month old grandson; NOK: Daughter, Margreta Journey, [phone]   Chief Complaint: Weakness  HPI: Shelley Spencer is a 74 y.o. female with medical history significant of pre-diabetes; HTN; CAD; and HLD presenting with weakness after giving blood.  She donated blood on Monday afternoon.  When she got home, she had difficulty walking, very tired, slurred her words.  The next day she called Dr. Joylene Draft and he told her to come in - where she sat in the waiting room for 11 hours.  She continues to notice some unsteadiness with walking.  She slurring her words.  Perhaps a bit of left arm weakness.  No dysphagia.     ED Course:  Carryover, per Dr. Alcario Drought:  74 yo F with a stroke, L sided weakness and ataxia. MRI shows acute/subacute stroke.  Review of Systems: As per HPI; otherwise review of systems reviewed and negative.   Ambulatory Status:  Ambulates without assistance  COVID Vaccine Status:   Complete  Past Medical History:  Diagnosis Date  . Anemia   . Anxiety   . Arthritis   . Asthma   . Bicornuate uterus    double cervix  . CAD (coronary artery disease)   . Colon polyp   . Depression   . Deviated septum   . DJD (degenerative joint disease)    L-Spine  . Fibroids   . GERD (gastroesophageal reflux disease)   . Hematuria   . Hiatal hernia   . HNP (herniated nucleus pulposus)    lower back  . Hyperlipidemia   . Hypertension   . MVP (mitral valve prolapse)    pt denies, pt states she had a Echo ?2008 and it did not show MVP  . Pre-diabetes   . Retinal tear 2005   Small right retinal tear  . Shingles 1980's   on her waist    Past Surgical History:  Procedure Laterality Date  . ANTERIOR CERVICAL DECOMP/DISCECTOMY FUSION N/A 09/05/2017   Procedure:  Cervical four-five, Cervical five-six, Cervical six-seven Anterior cervical decompression/discectomy/fusion;  Surgeon: Erline Levine, MD;  Location: [facility];  Service: Neurosurgery;  Laterality: N/A;  . bone spur surgery Bilateral    x 3  . CARDIOVASCULAR STRESS TEST  02/08/2014  . COLONOSCOPY    . CYST EXCISION Left 09/29/2018   Procedure: LEFT MIDDLE FINGER CYST REMOVAL AND DEBRIDEMENT OF DISTAL INTERPHALANGEAL JOINT;  Surgeon: Daryll Brod, MD;  Location: [facility];  Service: Orthopedics;  Laterality: Left;  . FINGER SURGERY Left    left third finger mucoid cyst removed  . FOOT NEUROMA SURGERY Bilateral   . HYSTEROSCOPY     D&C PMB Endo Cx Polyp  . KNEE ARTHROSCOPY WITH MENISCAL REPAIR Right 2016   --Flossie Dibble, MD  . LAPAROSCOPIC HYSTERECTOMY  7/209/10   R-TLH/BSO  Fibroids, BAck Pain, Adenomyosis  . LUMBAR FUSION    . removal vaginal septum    . TOTAL KNEE ARTHROPLASTY Right 05/17/2019   Procedure: TOTAL KNEE ARTHROPLASTY;  Surgeon: Gaynelle Arabian, MD;  Location: [facility];  Service: Orthopedics;  Laterality: Right;  76min  . TUBAL LIGATION      Social History   Socioeconomic History  . Marital status: Married    Spouse name: Not on file  . Number of children: 2  .  Years of education: Not on file  . Highest education level: Not on file  Occupational History  . Occupation: Editor, commissioning  Tobacco Use  . Smoking status: Former Smoker    Packs/day: 1.00    Years: 20.00    Pack years: 20.00    Types: Cigarettes    Quit date: 09/24/1979    Years since quitting: 40.5  . Smokeless tobacco: Never Used  Vaping Use  . Vaping Use: Never used  Substance and Sexual Activity  . Alcohol use: Not Currently    Alcohol/week: 1.0 standard drink    Types: 1 Standard drinks or equivalent per week    Comment: rarely  . Drug use: No  . Sexual activity: Yes    Partners: Male    Birth control/protection: Surgical    Comment: R-TLH/BSO  Other Topics Concern  . Not on file  Social  History Narrative   Husband with heart transplant.  Lives with husband and son.   Social Determinants of Health   Financial Resource Strain:   . Difficulty of Paying Living Expenses:   Food Insecurity:   . Worried About Charity fundraiser in the Last Year:   . Arboriculturist in the Last Year:   Transportation Needs:   . Film/video editor (Medical):   Marland Kitchen Lack of Transportation (Non-Medical):   Physical Activity:   . Days of Exercise per Week:   . Minutes of Exercise per Session:   Stress:   . Feeling of Stress :   Social Connections:   . Frequency of Communication with Friends and Family:   . Frequency of Social Gatherings with Friends and Family:   . Attends Religious Services:   . Active Member of Clubs or Organizations:   . Attends Archivist Meetings:   Marland Kitchen Marital Status:   Intimate Partner Violence:   . Fear of Current or Ex-Partner:   . Emotionally Abused:   Marland Kitchen Physically Abused:   . Sexually Abused:     Allergies  Allergen Reactions  . Amlodipine Other (See Comments)    Legs swell some and she doesn't like it.  . Fenofibrate Other (See Comments)    Leg pain  . Omeprazole Other (See Comments)    Unknown  . Simvastatin Other (See Comments)    Myalgia, leg pains  . Singulair [Montelukast Sodium] Other (See Comments)    Affected mood  . Statins Other (See Comments)    Myalgia, leg pains    Family History  Problem Relation Age of Onset  . Heart failure Father        Possibly related to EtOH  . Alcohol abuse Father   . Emphysema Father   . Emphysema Mother   . Heart failure Mother        Possibly related to EtOH  . Diabetes Mother   . Alcohol abuse Mother   . Valvular heart disease Brother   . Bipolar disorder Brother   . Crohn's disease Daughter   . Thyroid disease Daughter   . Colon cancer Neg Hx   . Stomach cancer Neg Hx   . Esophageal cancer Neg Hx   . Rectal cancer Neg Hx   . Liver cancer Neg Hx   . Stroke Neg Hx     Prior to  Admission medications   Medication Sig Start Date End Date Taking? Authorizing Provider  acetaminophen (TYLENOL) 650 MG CR tablet Take 1,300 mg by mouth every 8 (eight) hours as needed for pain.  Yes [provider]  albuterol (VENTOLIN HFA) 108 (90 Base) MCG/ACT inhaler Inhale 1-2 puffs into the lungs every 6 (six) hours as needed for wheezing or shortness of breath.    Yes [provider]  ALPRAZolam (XANAX) 0.25 MG tablet Take 0.25 mg by mouth 3 (three) times daily as needed for anxiety or sleep.  10/28/19  Yes [provider]  budesonide-formoterol (SYMBICORT) 160-4.5 MCG/ACT inhaler Inhale 2 puffs into the lungs daily.    Yes [provider]  buPROPion (WELLBUTRIN XL) 150 MG 24 hr tablet Take 150 mg by mouth daily.   Yes [provider]  CALCIUM-VITAMIN D PO Take 1 tablet by mouth daily.   Yes [provider]  cetirizine (ZYRTEC) 10 MG tablet Take 10 mg by mouth daily.   Yes [provider]  docusate sodium (COLACE) 100 MG capsule Take 1 capsule (100 mg total) by mouth 2 (two) times daily. Patient taking differently: Take 100 mg by mouth daily as needed for moderate constipation.  09/06/17  Yes Newman Pies, MD  doxazosin (CARDURA) 4 MG tablet Take 4 mg by mouth daily.    Yes [provider]  ezetimibe (ZETIA) 10 MG tablet Take 1 tablet (10 mg total) by mouth daily after supper. 03/06/20 03/01/21 Yes Adrian Prows, MD  furosemide (LASIX) 40 MG tablet Take 40 mg by mouth daily. 02/19/20  Yes [provider]  gabapentin (NEURONTIN) 300 MG capsule Take 1 capsule (300 mg total) by mouth 3 (three) times daily. Take a 300 mg capsule three times a day for two weeks following surgery.Then take a 300 mg capsule two times a day for two weeks. Then take a 300 mg capsule once a day for two weeks. Then discontinue the Gabapentin. Patient taking differently: Take 300 mg by mouth 2 (two) times daily.  05/18/19  Yes Maurice March,  PA-C  Multiple Vitamins-Minerals (MULTIVITAMIN PO) Take 1 tablet by mouth daily.   Yes [provider]  niacin (NIASPAN) 1000 MG CR tablet Take 1,000 mg by mouth at bedtime.   Yes [provider]  nitroGLYCERIN (NITROSTAT) 0.4 MG SL tablet Place 1 tablet (0.4 mg total) under the tongue every 5 (five) minutes as needed for chest pain. 03/15/19 03/15/20 Yes Miquel Dunn, NP  olmesartan (BENICAR) 40 MG tablet Take 40 mg by mouth every evening.   Yes [provider]  pantoprazole (PROTONIX) 40 MG tablet Take 1 tablet (40 mg total) by mouth 2 (two) times daily. Patient taking differently: Take 40 mg by mouth every evening.  10/16/16  Yes Nandigam, Venia Minks, MD  PARoxetine (PAXIL) 40 MG tablet Take 40 mg by mouth every evening.    Yes [provider]  potassium chloride SA (K-DUR) 20 MEQ tablet Take 20 mEq by mouth daily.    Yes [provider]  traZODone (DESYREL) 150 MG tablet Take 150 mg by mouth at bedtime.   Yes [provider]  vitamin B-12 (CYANOCOBALAMIN) 1000 MCG tablet Take 1,000 mcg by mouth daily.   Yes [provider]  simvastatin (ZOCOR) 20 MG tablet Take 1 tablet (20 mg total) by mouth daily after supper. Tue and Thur for 2 weeks and if tolerated three times a week. Patient taking differently: Take 20 mg by mouth 2 (two) times a week. Tue and Thur for 2 weeks and if tolerated three times a week. 03/06/20 03/01/21  Adrian Prows, MD    Physical Exam: Vitals:   03/15/20 2585 03/15/20 0636 03/15/20  0645 03/15/20 0650  BP: (!) 119/57 (!) 167/65 (!) 160/59   Pulse: 77 69  64  Resp:      Temp:      TempSrc:      SpO2: 93% 97%  97%  Weight:      Height:         . General:  Appears calm and comfortable and is NAD . Eyes:  PERRL, EOMI, normal lids, iris . ENT:  grossly normal hearing, lips & tongue, mmm; appropriate dentition . Neck:  no LAD, masses or thyromegaly; no carotid bruits . Cardiovascular:  RRR, no r/g, 2/6  systolic murmur. No LE edema.  Marland Kitchen Respiratory:   CTA bilaterally with no wheezes/rales/rhonchi.  Normal respiratory effort. . Abdomen:  soft, NT, ND, NABS . Skin:  no rash or induration seen on limited exam . Musculoskeletal:  grossly normal tone BUE/BLE, good ROM, no bony abnormality . Psychiatric:  grossly normal mood and affect, speech fluent and appropriate but subtly dysarthric, AOx3 . Neurologic:  CN 2-12 grossly intact with mild left facial droop, moves all extremities in coordinated fashion, sensation intact    Radiological Exams on Admission: CT HEAD WO CONTRAST  Result Date: 03/15/2020 CLINICAL DATA:  Weakness and lethargy after blood donation yesterday. Intermittent episodes of possible expressive aphasia and left arm weakness. EXAM: CT HEAD WITHOUT CONTRAST TECHNIQUE: Contiguous axial images were obtained from the base of the skull through the vertex without intravenous contrast. COMPARISON:  None. FINDINGS: Brain: No evidence of acute infarction, hemorrhage, hydrocephalus, extra-axial collection or mass lesion/mass effect. Symmetric prominence of the ventricles, cisterns and sulci compatible with parenchymal volume loss. Patchy areas of white matter hypoattenuation are most compatible with chronic microvascular angiopathy. Vascular: Atherosclerotic calcification of the carotid siphons. No hyperdense vessel. Skull: No calvarial fracture or suspicious osseous lesion. No scalp swelling or hematoma. Sinuses/Orbits: Paranasal sinuses and mastoid air cells are predominantly clear. Included orbital structures are unremarkable. Other: None IMPRESSION: 1. No acute intracranial findings. If there is persisting clinical concern for infarct, MRI is more sensitive and specific for early changes of ischemia. 2. Mild chronic microvascular angiopathy and parenchymal volume loss. Electronically Signed   By: Lovena Le M.D.   On: 03/15/2020 01:32   MR BRAIN WO CONTRAST  Result Date: 03/15/2020 CLINICAL  DATA:  Episode of abnormal speech with left arm weakness. Weakness and lethargy after getting blood. EXAM: MRI HEAD WITHOUT CONTRAST TECHNIQUE: Multiplanar, multiecho pulse sequences of the brain and surrounding structures were obtained without intravenous contrast. COMPARISON:  CT head without contrast 03/15/2020 FINDINGS: Brain: The diffusion-weighted images demonstrate an acute/subacute nonhemorrhagic right paramedian pontine infarct no additional infarcts are present. Subtle T2 signal changes are associated. Periventricular and scattered subcortical T2 hyperintensities are moderately advanced for age. T2 signal changes are present within the corona radiata bilaterally. The ventricles are of normal size. No significant extraaxial fluid collection is present. The brainstem and cerebellum are within normal limits. Vascular: Flow is present in the major intracranial arteries. Skull and upper cervical spine: The craniocervical junction is normal. Cervical spine hardware is noted at C4. Upper cervical spine is otherwise within normal limits. Marrow signal is unremarkable. Sinuses/Orbits: The paranasal sinuses and mastoid air cells are clear. The globes and orbits are within normal limits. IMPRESSION: 1. Acute/subacute nonhemorrhagic linear right paramedian pontine infarct. This represents a basilar perforator infarct. 2. Periventricular and scattered subcortical T2 hyperintensities bilaterally are moderately advanced for age. This likely reflects the sequela of chronic microvascular ischemia. These results were  called by telephone at the time of interpretation on 03/15/2020 at 5:30 am to provider DAVID [facility] , who verbally acknowledged these results. Electronically Signed   By: San Morelle M.D.   On: 03/15/2020 05:30    EKG: Independently reviewed.  NSR with rate 79; nonspecific ST changes with no evidence of acute ischemia; NSCSLT   Labs on Admission: I have personally reviewed the available labs and  imaging studies at the time of the admission.  Pertinent labs:   BUN 20/Creatinine 1.49/GFR 3517/1.08/52 in 04/2019 WBC 7.3 Hgb 11.3 UA: moderate LE UDS negative ETOH <10 COVID negative Lipids: 211/69/95/282 in April 2021   Assessment/Plan Principal Problem:   CVA (cerebral vascular accident) ([place]) Active Problems:   HLD (hyperlipidemia)   Benign essential HTN   Elevated fasting blood sugar   Obesity    CVA -Patient presenting with weakness, ataxia, dysarthria -Concerning for TIA/CVA -MRI confirms "Acute/subacute nonhemorrhagic linear right paramedian pontine infarct. This represents a basilar perforator infarct." -Will admit for further CVA evaluation -Telemetry monitoring -Echo -Risk stratification with FLP, A1c; will also check TSH  -ASA daily, likely needs a washout period of DAPT -Neurology consult -PT/OT/ST/Nutrition Consults  HTN -Allow permissive HTN for now -Treat BP only if >220/120, and then with goal of 15% reduction -Hold Cardura, Benicar and plan to restart in 48-72 hours   HLD -Check FLP -She has had difficulty with statins and so is taking Zocor twice weekly with Zetia -Will continue as above but encourage increasing doses of statins if able to tolerate -Consider addition of beyond red yeast rice - although limited outcome data, it is unlikely to be harmful   Pre-DM -A1c in 2018 was 5.7 -Mild hyperglycemia -She is not taking medications for this issue -Will check A1c and order carb modified (heart healthy) diet -Will order moderate-scale SSI   Obesity Body mass index is 35.48 kg/m. -Weight loss should be encouraged -Outpatient PCP/bariatric medicine encouraged  COPD -Continue Symbicort [facility] formulary substitution) and prn Albuterol  Depression/anxiety -Continue Paxil, Wellbutrin, Trazodone, and prn Xanax    Note: This patient has been tested and is negative for the novel coronavirus COVID-19.   DVT prophylaxis:  Lovenox  Code  Status: Full - confirmed with patient Family Communication: None present; I spoke with her daughter by telephone at the time of admission Disposition Plan:  The patient is from: home  Anticipated d/c is to: home without [facility] services  Anticipated d/c date will depend on clinical response to treatment, likely 1-2 days  Patient is currently: acutely ill Consults called: Neurology; PT/OT/ST/Nutrition  Admission status: Admit - It is my clinical opinion that admission to [place] is reasonable and necessary because of the expectation that this patient will require hospital care that crosses at least 2 midnights to treat this condition based on the medical complexity of the problems presented.  Given the aforementioned information, the predictability of an adverse outcome is felt to be significant.    Karmen Bongo MD [facility]   How to contact the [facility] Attending or Consulting provider [place] or covering provider during after hours [place], for this patient?  1. Check the care team in [facility] and look for a) attending/consulting [facility] provider listed and b) the [facility] team listed 2. Log into [email or web address] and use [place]'s universal password to access. If you do not have the password, please contact the hospital operator. 3. Locate the [facility] provider you are looking for under [facility] and page to a number  that you can be directly reached. 4. If you still have difficulty reaching the provider, please page the [facility] (Director on Call) for the Hospitalists listed on amion for assistance.   03/15/2020, 8:19 AM

## 2020-03-15 NOTE — ED Notes (Signed)
Called ED Pharm to ask for any meds that need to be retimed. Seth Bake reviewed them and said retiming was not necessary.

## 2020-03-15 NOTE — Progress Notes (Signed)
STROKE TEAM PROGRESS NOTE   INTERVAL HISTORY Patient is in the ED. I have personally reviewed history of presenting illness with the patient, her daughter, electronic medical records and imaging films in PACS.  She presented with sudden onset of left-sided weakness and difficulty walking on Monday around 4 PM.  MRI scan showed small right paramedian pontine lacunar infarct.  Hemoglobin A1c 6.1.  LDL cholesterol 95 mg percent in April this year.  MRA of the brain and neck are pending. Vitals:   03/15/20 0636 03/15/20 0645 03/15/20 0650 03/15/20 0832  BP: (!) 167/65 (!) 160/59  (!) 187/67  Pulse: 69  64 66  Resp:    16  Temp:      TempSrc:      SpO2: 97%  97% 97%  Weight:      Height:        CBC:  Recent Labs  Lab 03/14/20 1413  WBC 7.3  HGB 11.3*  HCT 37.7  MCV 84.5  PLT 361    Basic Metabolic Panel:  Recent Labs  Lab 03/14/20 1413  NA 143  K 3.4*  CL 104  CO2 27  GLUCOSE 101*  BUN 20  CREATININE 1.49*  CALCIUM 9.0   Lipid Panel:     Component Value Date/Time   CHOL 211 (H) 12/28/2019 1452   TRIG 282 (H) 12/28/2019 1452   HDL 69 12/28/2019 1452   CHOLHDL 3.1 12/28/2019 1452   LDLCALC 95 12/28/2019 1452   HgbA1c:  Lab Results  Component Value Date   HGBA1C 6.1 (H) 03/15/2020   Urine Drug Screen:     Component Value Date/Time   LABOPIA NONE DETECTED 03/15/2020 0140   COCAINSCRNUR NONE DETECTED 03/15/2020 0140   LABBENZ NONE DETECTED 03/15/2020 0140   AMPHETMU NONE DETECTED 03/15/2020 0140   THCU NONE DETECTED 03/15/2020 0140   LABBARB NONE DETECTED 03/15/2020 0140    Alcohol Level     Component Value Date/Time   ETH <10 03/15/2020 0146    IMAGING past 24 hours CT HEAD WO CONTRAST  Result Date: 03/15/2020 CLINICAL DATA:  Weakness and lethargy after blood donation yesterday. Intermittent episodes of possible expressive aphasia and left arm weakness. EXAM: CT HEAD WITHOUT CONTRAST TECHNIQUE: Contiguous axial images were obtained from the base of the  skull through the vertex without intravenous contrast. COMPARISON:  None. FINDINGS: Brain: No evidence of acute infarction, hemorrhage, hydrocephalus, extra-axial collection or mass lesion/mass effect. Symmetric prominence of the ventricles, cisterns and sulci compatible with parenchymal volume loss. Patchy areas of white matter hypoattenuation are most compatible with chronic microvascular angiopathy. Vascular: Atherosclerotic calcification of the carotid siphons. No hyperdense vessel. Skull: No calvarial fracture or suspicious osseous lesion. No scalp swelling or hematoma. Sinuses/Orbits: Paranasal sinuses and mastoid air cells are predominantly clear. Included orbital structures are unremarkable. Other: None IMPRESSION: 1. No acute intracranial findings. If there is persisting clinical concern for infarct, MRI is more sensitive and specific for early changes of ischemia. 2. Mild chronic microvascular angiopathy and parenchymal volume loss. Electronically Signed   By: Lovena Le M.D.   On: 03/15/2020 01:32   MR BRAIN WO CONTRAST  Result Date: 03/15/2020 CLINICAL DATA:  Episode of abnormal speech with left arm weakness. Weakness and lethargy after getting blood. EXAM: MRI HEAD WITHOUT CONTRAST TECHNIQUE: Multiplanar, multiecho pulse sequences of the brain and surrounding structures were obtained without intravenous contrast. COMPARISON:  CT head without contrast 03/15/2020 FINDINGS: Brain: The diffusion-weighted images demonstrate an acute/subacute nonhemorrhagic right paramedian pontine infarct no  additional infarcts are present. Subtle T2 signal changes are associated. Periventricular and scattered subcortical T2 hyperintensities are moderately advanced for age. T2 signal changes are present within the corona radiata bilaterally. The ventricles are of normal size. No significant extraaxial fluid collection is present. The brainstem and cerebellum are within normal limits. Vascular: Flow is present in the  major intracranial arteries. Skull and upper cervical spine: The craniocervical junction is normal. Cervical spine hardware is noted at C4. Upper cervical spine is otherwise within normal limits. Marrow signal is unremarkable. Sinuses/Orbits: The paranasal sinuses and mastoid air cells are clear. The globes and orbits are within normal limits. IMPRESSION: 1. Acute/subacute nonhemorrhagic linear right paramedian pontine infarct. This represents a basilar perforator infarct. 2. Periventricular and scattered subcortical T2 hyperintensities bilaterally are moderately advanced for age. This likely reflects the sequela of chronic microvascular ischemia. These results were called by telephone at the time of interpretation on 03/15/2020 at 5:30 am to provider Shelley Spencer , who verbally acknowledged these results. Electronically Signed   By: San Morelle M.D.   On: 03/15/2020 05:30    PHYSICAL EXAM Pleasant elderly Caucasian lady not in distress. . Afebrile. Head is nontraumatic. Neck is supple without bruit.    Cardiac exam no murmur or gallop. Lungs are clear to auscultation. Distal pulses are well felt. Neurological Exam ;  Awake  Alert oriented x 3. Normal speech and language.eye movements full without nystagmus.fundi were not visualized. Vision acuity and fields appear normal. Hearing is normal. Palatal movements are normal. Face symmetric. Tongue midline. Normal strength, tone, reflexes and coordination.  Diminished fine finger movements on the left.  Orbits right over left upper extremity.. Normal sensation. Gait deferred. NIH stroke scale 0.  Premorbid modified Rankin score of 0  ASSESSMENT/PLAN Shelley Spencer is a 74 y.o. female with history of HTN, HLD, pre-diabetes presenting with difficulty walking and L sided weakness.   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 Small vessel disease. Atrophy.   MRI  R paramedian pontine infarct.  Small vessel disease.   MRA head  No LVO or high-grade stenosis. 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 Recommend pharmacologic treatment   No antithrombotic prior to admission, now on aspirin 81 mg daily and clopidogrel 75 mg daily. Continue DAPT x 3 weeks then aspirin alone  Therapy recommendations:  pending   Disposition:  pending   Hypertension  Stable . Permissive hypertension (OK if < 220/120) but gradually normalize in 5-7 days . Long-term BP goal normotensive  Hyperlipidemia  Home meds:  Simvastatin 20 mg Tues and Sat and zetiq 10, resumed in hospital  LDL 95, goal < 70  Unable to tolerate higher dose of statin d/t myalgias  Continue current statin dose and zetia at discharge  Pre-Diabetes   HgbA1c 6.1, goal < 7.0  Other Stroke Risk Factors  Advanced age  Former Cigarette smoker, quit 40 yrs ago  Hx ETOH use, alcohol level <10, advised to drink no more than 1 drink(s) a day  Obesity, Body mass index is 35.48 kg/m., recommend weight loss, diet and exercise as appropriate   Other Active Problems  COPD  Depression / anxiety  Hospital day # 0 She presented with dizziness and gait ataxia secondary to right pontine  lacunar infarct from small vessel disease.  She remains at risk for neurological worsening and recurrent stroke.  Recommend dual antiplatelet therapy of aspirin and Plavix for 3 weeks followed by aspirin alone.  Continue ongoing stroke work-up and aggressive risk factor modification.  Patient may also consider possible participation in the [facility] stroke prevention study and she seems interested and was given information to review and decide. Greater than 50% time during the 35-minute visit were spent on counseling and coordination of care about her lacunar stroke and discussion about  stroke prevention and treatment and answering questions.   Antony Contras, MD To contact Stroke Continuity provider, please refer to [email or web address] After hours, contact General Neurology

## 2020-03-15 NOTE — ED Notes (Signed)
Dr. Yates at bedside.  

## 2020-03-15 NOTE — ED Notes (Signed)
Patient returned from MRI.

## 2020-03-15 NOTE — Progress Notes (Signed)
  Echocardiogram Echocardiogram Transesophageal has been performed.  [name] 03/15/2020, 2:15 PM

## 2020-03-16 DIAGNOSIS — R531 Weakness: Secondary | ICD-10-CM

## 2020-03-16 DIAGNOSIS — E782 Mixed hyperlipidemia: Secondary | ICD-10-CM

## 2020-03-16 LAB — URINE CULTURE

## 2020-03-16 LAB — GLUCOSE, CAPILLARY
Glucose-Capillary: 95 mg/dL (ref 70–99)
Glucose-Capillary: 83 mg/dL (ref 70–99)
Glucose-Capillary: 96 mg/dL (ref 70–99)

## 2020-03-16 MED ORDER — GABAPENTIN 300 MG PO CAPS: 300.0000 mg | ORAL_CAPSULE | Freq: Two times a day (BID) | ORAL | Status: DC

## 2020-03-16 MED ORDER — ENSURE ENLIVE PO LIQD: 237.0000 mL | Freq: Two times a day (BID) | ORAL | Status: DC

## 2020-03-16 MED ORDER — CLOPIDOGREL BISULFATE 75 MG PO TABS: 75.0000 mg | ORAL_TABLET | Freq: Every day | ORAL | 0 refills | Status: AC

## 2020-03-16 MED ORDER — ASPIRIN 81 MG PO TBEC: 81.0000 mg | DELAYED_RELEASE_TABLET | Freq: Every day | ORAL | 11 refills | Status: DC

## 2020-03-16 MED ORDER — ADULT MULTIVITAMIN W/MINERALS CH
1.0000 | ORAL_TABLET | Freq: Every day | ORAL | Status: DC
  Administered 2020-03-16: 1 via ORAL
  Filled 2020-03-16: qty 1

## 2020-03-16 MED ORDER — SIMVASTATIN 20 MG PO TABS: 20.0000 mg | ORAL_TABLET | ORAL | Status: DC

## 2020-03-16 MED ORDER — STUDY - INVESTIGATIONAL MEDICATION
1.0000 | Freq: Every day | 99 refills | Status: DC
Start: 2020-03-16 — End: 2020-10-23

## 2020-03-16 MED ORDER — ENSURE ENLIVE PO LIQD: 237.0000 mL | Freq: Two times a day (BID) | ORAL | 12 refills | Status: DC

## 2020-03-16 MED ORDER — STROKE: EARLY STAGES OF RECOVERY BOOK: 1.0000 | Freq: Once | Status: AC

## 2020-03-16 NOTE — TOC Progression Note (Signed)
Transition of Care [facility]) - Progression Note    Patient Details  Name: MICALA SALTSMAN MRN: [id number] Date of Birth: 1946-04-03  Transition of Care [facility]) CM/SW [place], [place] Phone Number: 03/16/2020, 3:40 PM  Clinical Narrative:     CSW met with pt and daughter Margreta Journey at bedside. Daughter and mother expressed concerns for their lengthy wait in ED while having stroke symptoms. CSW provided contact information for Patient Experience. Family plans to contact Patient Experience and file complaint.   CSW also met with pt and daughter and confirmed they are okay with rec for OP PT and referral to [facility].        Expected Discharge Plan and Services                                                 Social Determinants of Health (SDOH) Interventions    Readmission Risk Interventions No flowsheet data found.

## 2020-03-16 NOTE — Plan of Care (Signed)
Pt is adequate for discharge. 

## 2020-03-16 NOTE — Progress Notes (Signed)
Initial Nutrition Assessment  DOCUMENTATION CODES:   Obesity unspecified  INTERVENTION:    Ensure Enlive po BID, each supplement provides 350 kcal and 20 grams of protein  MVI daily   NUTRITION DIAGNOSIS:   Inadequate oral intake related to poor appetite as evidenced by per patient/family report.  GOAL:   Patient will meet greater than or equal to 90% of their needs  MONITOR:   PO intake, Supplement acceptance, Weight trends, Labs, I & O's  REASON FOR ASSESSMENT:   Consult Assessment of nutrition requirement/status  ASSESSMENT:   Patient with PMH significant for TN, CAD, and HLD. Presents this admission with CVA.   Pt endorses having chronic decrease in appetite. Typically consumes two meals daily that consisted of a protein shake for breakfast and stir-fry or meat with grain for dinner. Skips lunch as she is working during this time. Denies issues with swallowing or chewing. Encouraged pt to increase meals daily and discussed how to make them balanced. Appetite this admission is slow to progress. RD to provide Ensure to maximize kcal and protein this admission.   Pt reports a UBW of 195 lb and denies unintentional wt loss. Records indicate pt has maintained her weight over the last year.   Drips: NS @ 50 ml/hr  Medications: SS novolog  Labs: K 3.4 (L)   NUTRITION - FOCUSED PHYSICAL EXAM:    Most Recent Value  Orbital Region No depletion  Upper Arm Region No depletion  Thoracic and Lumbar Region No depletion  Buccal Region No depletion  Temple Region No depletion  Clavicle Bone Region No depletion  Clavicle and Acromion Bone Region No depletion  Scapular Bone Region No depletion  Dorsal Hand No depletion  Patellar Region No depletion  Anterior Thigh Region No depletion  Posterior Calf Region No depletion  Edema (RD Assessment) None  Hair Reviewed  Eyes Reviewed  Mouth Reviewed  Skin Reviewed  Nails Reviewed     Diet Order:   Diet Order             Diet heart healthy/carb modified Room service appropriate? Yes; Fluid consistency: Thin  Diet effective ____                 EDUCATION NEEDS:   Education needs have been addressed  Skin:  Skin Assessment: Reviewed RN Assessment  Last BM:  6/23  Height:   Ht Readings from Last 1 Encounters:  03/15/20 5\' 2"  (1.575 m)    Weight:   Wt Readings from Last 1 Encounters:  03/15/20 88 kg    BMI:  Body mass index is 35.48 kg/m.  Estimated Nutritional Needs:   Kcal:  1700-1900 kcal  Protein:  85-100 grams  Fluid:  >/= 1.7 L/day   Mariana Single RD, LDN Clinical Nutrition Pager listed in [place]

## 2020-03-16 NOTE — Progress Notes (Signed)
STROKE TEAM PROGRESS NOTE   INTERVAL HISTORY Patient is sitting up in bed.  She states she is doing well.  Her daughter is at the bedside.  She is participating in the [place] stroke prevention study.  She had a study specific protocol MRI scan which showed stable appearance of her infarct.  MRA of the brain and neck a showed no significant large vessel stenosis or occlusion.  A probable 2 mm right cavernous ICA aneurysm is noted. Vitals:   03/15/20 2132 03/15/20 2322 03/16/20 0752 03/16/20 0758  BP: 140/65 (!) 142/65 (!) 172/76   Pulse: 83 79 72 76  Resp: 18 18 18 18   Temp: 97.9 F (36.6 C) 97.8 F (36.6 C) 98 F (36.7 C)   TempSrc: Oral Oral Oral   SpO2: 97% 94% 99% 96%  Weight:      Height:        CBC:  Recent Labs  Lab 03/14/20 1413  WBC 7.3  HGB 11.3*  HCT 37.7  MCV 84.5  PLT 948    Basic Metabolic Panel:  Recent Labs  Lab 03/14/20 1413  NA 143  K 3.4*  CL 104  CO2 27  GLUCOSE 101*  BUN 20  CREATININE 1.49*  CALCIUM 9.0   Lipid Panel:     Component Value Date/Time   CHOL 160 03/15/2020 0803   CHOL 211 (H) 12/28/2019 1452   TRIG 101 03/15/2020 0803   HDL 63 03/15/2020 0803   HDL 69 12/28/2019 1452   CHOLHDL 2.5 03/15/2020 0803   VLDL 20 03/15/2020 0803   LDLCALC 77 03/15/2020 0803   LDLCALC 95 12/28/2019 1452   HgbA1c:  Lab Results  Component Value Date   HGBA1C 6.1 (H) 03/15/2020   Urine Drug Screen:     Component Value Date/Time   LABOPIA NONE DETECTED 03/15/2020 0140   COCAINSCRNUR NONE DETECTED 03/15/2020 0140   LABBENZ NONE DETECTED 03/15/2020 0140   AMPHETMU NONE DETECTED 03/15/2020 0140   THCU NONE DETECTED 03/15/2020 0140   LABBARB NONE DETECTED 03/15/2020 0140    Alcohol Level     Component Value Date/Time   ETH <10 03/15/2020 0146    IMAGING past 24 hours MR BRAIN WO CONTRAST  Result Date: 03/15/2020 CLINICAL DATA:  Stroke follow-up. EXAM: MRI HEAD WITHOUT CONTRAST TECHNIQUE: Multiecho pulse sequences of the brain and  surrounding structures were obtained without intravenous contrast according to the research study protocol. DWI, SWI, T1, T2, and T2 FLAIR sequences were obtained in the axial plane. COMPARISON:  Head MRI earlier today FINDINGS: The study is mildly motion degraded. Brain: An acute right paramedian pontine infarct is unchanged. No new infarct, intracranial hemorrhage, mass, midline shift, or extra-axial fluid collection is identified. T2 hyperintensities in the cerebral white matter bilaterally are unchanged and nonspecific but compatible with moderate chronic small vessel ischemic disease. Mild cerebral atrophy is within normal limits for age. Vascular: Major intracranial vascular flow voids are preserved. Skull and upper cervical spine: No suspicious marrow lesion. Sinuses/Orbits: Unremarkable orbits. Visualized portions of the paranasal sinuses and mastoid air cells are clear. Other: None. IMPRESSION: 1. Unchanged acute right paramedian pontine infarct. 2. No new intracranial abnormality. 3. Moderate chronic small vessel ischemic disease. Electronically Signed   By: Logan Bores M.D.   On: 03/15/2020 19:19   SLEEP STUDY DOCUMENTS  Result Date: 03/16/2020 Ordered by an unspecified provider.   PHYSICAL EXAM Pleasant elderly Caucasian lady not in distress. . Afebrile. Head is nontraumatic. Neck is supple without bruit.  Cardiac exam no murmur or gallop. Lungs are clear to auscultation. Distal pulses are well felt. Neurological Exam ;  Awake  Alert oriented x 3. Normal speech and language.eye movements full without nystagmus.fundi were not visualized. Vision acuity and fields appear normal. Hearing is normal. Palatal movements are normal. Face symmetric. Tongue midline. Normal strength, tone, reflexes and coordination.  Diminished fine finger movements on the left.  Orbits right over left upper extremity.. Normal sensation. Gait deferred.    ASSESSMENT/PLAN [name] is a 74 y.o. female with  history of HTN, HLD, pre-diabetes presenting with difficulty walking and L sided weakness.   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 Small vessel disease. Atrophy.   MRI  R paramedian pontine infarct. Small vessel disease.   MRA head  No LVO or high-grade stenosis. Probable 94mm R cavernous ICA aneurysm   MRA neck poor contrast timing and motion degradation - but no significant stenoses  2D Echo EF 65 to 70%.  No clot  LDL 95  HgbA1c 6.1  SCDs added for VTE prophylaxis. Recommend pharmacologic treatment   No antithrombotic prior to admission, now on aspirin 81 mg daily and clopidogrel 75 mg daily. Continue DAPT x 3 weeks then aspirin alone  Therapy recommendations:  pending   Disposition:  pending   Hypertension  Stable . Permissive hypertension (OK if < 220/120) but gradually normalize in 5-7 days . Long-term BP goal normotensive  Hyperlipidemia  Home meds:  Simvastatin 20 mg Tues and Sat and zetiq 10, resumed in hospital  LDL 95, goal < 70  Unable to tolerate higher dose of statin d/t myalgias  Continue current statin dose and zetia at discharge  Pre-Diabetes   HgbA1c 6.1, goal < 7.0  Other Stroke Risk Factors  Advanced age  Former Cigarette smoker, quit 40 yrs ago  Hx ETOH use, alcohol level <10, advised to drink no more than 1 drink(s) a day  Obesity, Body mass index is 35.48 kg/m., recommend weight loss, diet and exercise as appropriate   Other Active Problems  COPD  Depression / anxiety  Hospital day # 1 She presented with dizziness and gait ataxia secondary to right pontine lacunar infarct from small vessel disease.  She remains at risk for neurological worsening and recurrent stroke.  Recommend dual antiplatelet therapy of aspirin and Plavix for 3 weeks followed by aspirin alone.  Continue  aggressive risk factor modification.  Patient is participating   in the [facility] stroke  prevention study .patient may be discharged home today.  Follow-up with an outpatient stroke clinic in 6 weeks.  Long discussion with patient and daughter and answered questions.  Discussed with Dr. [place] Pink greater than 50% time during the 25-minute visit were spent on counseling and coordination of care about her lacunar stroke and discussion about stroke prevention and treatment and answering questions.   Antony Contras, MD To contact Stroke Continuity provider, please refer to [email or web address] After hours, contact General Neurology

## 2020-03-16 NOTE — Evaluation (Addendum)
Physical Therapy Evaluation Patient Details Name: Shelley Spencer MRN: [id number] DOB: Feb 01, 1946 Today's Date: 03/16/2020   History of Present Illness   Shelley Spencer is a 74 y.o. female with a history of hypertension, hyperlipidemia, prediabetes who presents with difficulty walking left-sided weakness started about 3:30 PM on 6/21.  She states that she had gone to donate blood, and when she returned she noticed she was off balance.  She was also slurring.  Due to the symptoms not improving, she sought care in the emergency department yesterday.  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  Clinical Impression   Pt presents with generalized weakness, impaired dynamic standing balance with DGI score 13/24 indicating increased risk of falls, and decreased activity tolerance. Pt to benefit from acute PT to address deficits. Pt ambulated hallway distance without AD and occasional min assist to steady, especially during challenges to gait. No focal neurologic deficits noted, and per pt she feels her balance has been getting worse over the last few months but feels it is acutely worse due to CVA. PT verbally reviewed [facility] s/s of CVA, pt and pt's daughter understand. Per pt, her son will be available to assist her as needed upon d/c home, and will drive her to and from appointments as needed until MD clearance to drive (pt hoping to clarify this prior to d/c home). PT recommending OPPT to address balance and strength deficits, as pt is eager to get stronger and feel more steady. PT to progress mobility as tolerated, and will continue to follow acutely.     Follow Up Recommendations Outpatient PT    Equipment Recommendations  None recommended by PT    Recommendations for Other Services       Precautions / Restrictions Precautions Precautions: Fall Restrictions Weight Bearing Restrictions: No      Mobility  Bed Mobility Overal bed mobility:  Needs Assistance Bed Mobility: Supine to Sit     Supine to sit: Supervision;HOB elevated     General bed mobility comments: for safety, use of bedrails and HOB elevation to perform.  Transfers Overall transfer level: Modified independent               General transfer comment: Mod I for increased time to rise and steady.  Ambulation/Gait Ambulation/Gait assistance: Min guard;Min assist Gait Distance (Feet): 250 Feet Assistive device: None Gait Pattern/deviations: Step-through pattern;Decreased stride length;Wide base of support;Narrow base of support Gait velocity: decr   General Gait Details: Min guard for safety, with occasional min assist to steady with dynamic balance challenges (see balance section). Varying BOS noted throughout ambulation, moreso with distraction.  Stairs Stairs: Yes Stairs assistance: Min guard Stair Management: One rail Left;Alternating pattern;Step to pattern;Forwards Number of Stairs: 3 General stair comments: for safety, increased time. Step through gait during ascending, step to with descending. Verbal cuing to take her time as pt trying to perform stair navigation quickly  Wheelchair Mobility    Modified Rankin (Stroke Patients Only) Modified Rankin (Stroke Patients Only) Pre-Morbid Rankin Score: No significant disability Modified Rankin: Moderately severe disability     Balance Overall balance assessment: Needs assistance;History of Falls (1 fall over a year ago) Sitting-balance support: No upper extremity supported;Feet unsupported Sitting balance-Leahy Scale: Good     Standing balance support: No upper extremity supported;During functional activity Standing balance-Leahy Scale: Fair Standing balance comment: see DGI  Standardized Balance Assessment Standardized Balance Assessment : Dynamic Gait Index   Dynamic Gait Index Level Surface: Mild Impairment Change in Gait Speed: Mild Impairment Gait with  Horizontal Head Turns: Moderate Impairment Gait with Vertical Head Turns: Moderate Impairment Gait and Pivot Turn: Mild Impairment Step Over Obstacle: Mild Impairment Step Around Obstacles: Mild Impairment Steps: Moderate Impairment Total Score: 13       Pertinent Vitals/Pain      Home Living Family/patient expects to be discharged to:: Private residence Living Arrangements: Spouse/significant other Available Help at Discharge: Family (son, husband) Type of Home: House Home Access: Level entry     Home Layout: Two level;Able to live on main level with bedroom/bathroom Home Equipment: Shower seat - built in;Shower seat;Walker - 2 wheels      Prior Function Level of Independence: Independent         Comments: Pt lives with elderly husband (27 y/o) who ambulates with walker and hx of heart transplant. Pt reports her and son assisting husband. Pt was Independent with ADLs, some IADLs. Son cooks meals and lives upstairs. Per pt, she states her balance has been declining over the past few months, but no falls     Hand Dominance   Dominant Hand: Right    Extremity/Trunk Assessment   Upper Extremity Assessment Upper Extremity Assessment: Defer to OT evaluation    Lower Extremity Assessment Lower Extremity Assessment: Generalized weakness    Cervical / Trunk Assessment Cervical / Trunk Assessment: Normal  Communication   Communication: No difficulties  Cognition Arousal/Alertness: Awake/alert Behavior During Therapy: WFL for tasks assessed/performed Overall Cognitive Status: Within Functional Limits for tasks assessed                                        General Comments      Exercises     Assessment/Plan    PT Assessment Patient needs continued PT services  PT Problem List Decreased strength;Decreased mobility;Decreased activity tolerance;Decreased balance       PT Treatment Interventions DME instruction;Therapeutic activities;Gait  training;Therapeutic exercise;Patient/family education;Balance training;Stair training;Functional mobility training;Neuromuscular re-education    PT Goals (Current goals can be found in the Care Plan section)  Acute Rehab PT Goals Patient Stated Goal: go home, get stronger and get back to [employer] soon PT Goal Formulation: With patient Time For Goal Achievement: 03/23/20 Potential to Achieve Goals: Good    Frequency Min 3X/week   Barriers to discharge        Co-evaluation               AM-PAC PT "6 Clicks" Mobility  Outcome Measure Help needed turning from your back to your side while in a flat bed without using bedrails?: A Little Help needed moving from lying on your back to sitting on the side of a flat bed without using bedrails?: A Little Help needed moving to and from a bed to a chair (including a wheelchair)?: A Little Help needed standing up from a chair using your arms (e.g., wheelchair or bedside chair)?: A Little Help needed to walk in hospital room?: A Little Help needed climbing 3-5 steps with a railing? : A Little 6 Click Score: 18    End of Session Equipment Utilized During Treatment: Gait belt Activity Tolerance: Patient tolerated treatment well;Patient limited by fatigue Patient left: in bed;with call bell/phone within reach;with family/visitor present Nurse Communication: Mobility status PT Visit Diagnosis:  Other abnormalities of gait and mobility (R26.89);Unsteadiness on feet (R26.81)    Time: 1040-1059 PT Time Calculation (min) (ACUTE ONLY): 19 min   Charges:   PT Evaluation $PT Eval Low Complexity: 1 Low        Selenne Coggin E, PT Acute Rehabilitation Services Pager [phone]  Office [phone]  Roxine Caddy D Elonda Husky 03/16/2020, 12:33 PM

## 2020-03-16 NOTE — Plan of Care (Signed)

## 2020-03-16 NOTE — Discharge Summary (Signed)
Discharge Summary  Shelley Spencer XTA:[id number] DOB: 05/09/1946  PCP: Crist Infante, MD  Admit date: 03/14/2020 Discharge date: 03/16/2020  Time spent: 35 minutes   Recommendations for Outpatient Follow-up:  1. New medication: Aspirin 81 mg p.o. daily 2. New medication: Plavix 75 mg p.o. daily x21 days 3. New medication: Trial drug for [place] stroke study.  Managed by Dr. Leonie Man, neurology. 4. Patient being referred for outpatient physical therapy 5. Patient will follow-up with her PCP in the next 2 months 6. Patient will follow up with neurology in regards to stroke study medication  Discharge Diagnoses:  Active Hospital Problems   Diagnosis Date Noted  . CVA (cerebral vascular accident) ([place]) 03/15/2020  . Left-sided weakness 03/16/2020  . Obesity 10/29/2012  . HLD (hyperlipidemia)   . Benign essential HTN 11/30/2009  . Elevated fasting blood sugar 11/30/2009    Resolved Hospital Problems  No resolved problems to display.    Discharge Condition: Improved, being discharged home  Diet recommendation: Heart healthy  Vitals:   03/16/20 0758 03/16/20 1603  BP:  (!) 169/68  Pulse: 76 75  Resp: 18 19  Temp:  98.3 F (36.8 C)  SpO2: 96% 99%    History of present illness:  74 year old female past medical history of obesity, hypertension and hyperlipidemia presented with left-sided weakness starting approximately at 3:30 PM on 6/21.  She noted she was off balance and also having some slurring of her speech.  When symptoms did not improve, she went to the emergency room.  MRI revealed a basilar perforator infarct.  Brought in to the hospitalist service for further evaluation.  Neurology consulted.  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  Has been started on aspirin and Plavix.  She will continue on Plavix for 21 days, aspirin indefinitely.  Weakness somewhat improved.  Slurred  speech resolved.  Seen by neurology.  Patient amenable to enrolling in [place] stroke medication study.  Additional specific stroke medication will be delivered to patient and she will take this daily.  Seen by PT and OT.  Cleared by OT.  PT recommending outpatient PT which we have given her referral for.  Active Problems:   HLD (hyperlipidemia): Patient has had issues with myalgias, but 2 months prior, is trying a trial of Zocor twice a week plus Zetia.  Her LDL check this morning significantly better at 77.  Advised her to continue.  She is meeting with her cardiologist next week and will attempt a trial of Zocor 3 times a week.  She will continue on Zetia.   Benign essential HTN: Blood pressure stable.  Continue home medications.    Elevated fasting blood sugar: A1c at 6.1.    Obesity: Meets criteria BMI greater than 30.  Counseled on weight loss, met with nutritionist.  Chronic diastolic heart failure: Incidentally noted on echocardiogram.  Already on ARB and beta-blocker plus Lasix.  Euvolemic.  Procedures:  Echocardiogram done 6/23: Preserved ejection fraction.  No valvular dysfunction.  Grade 1 diastolic dysfunction.  Consultations:  Neurology  Discharge Exam: BP (!) 169/68 (BP Location: Left Arm)   Pulse 75   Temp 98.3 F (36.8 C)   Resp 19   Ht _0  (1.575 m)   Wt 88 kg   LMP  (LMP Unknown)   SpO2 99%   BMI 35.48 kg/m   General: Alert and oriented x3, no acute distress Cardiovascular: Regular rate and rhythm, S1-S2 Respiratory:  Clear to auscultation bilaterally  Discharge Instructions You were cared for by a hospitalist during your hospital stay. If you have any questions about your discharge medications or the care you received while you were in the hospital after you are discharged, you can call the unit and asked to speak with the hospitalist on call if the hospitalist that took care of you is not available. Once you are discharged, your primary care physician  will handle any further medical issues. Please note that NO REFILLS for any discharge medications will be authorized once you are discharged, as it is imperative that you return to your primary care physician (or establish a relationship with a primary care physician if you do not have one) for your aftercare needs so that they can reassess your need for medications and monitor your lab values.  Discharge Instructions    Ambulatory referral to Physical Therapy   Complete by: As directed    Diet - low sodium heart healthy   Complete by: As directed    Increase activity slowly   Complete by: As directed    Outpatient physical therapy     Allergies as of 03/16/2020      Reactions   Amlodipine Other (See Comments)   Legs swell some and she doesn't like it.   Fenofibrate Other (See Comments)   Leg pain   Omeprazole Other (See Comments)   Unknown   Simvastatin Other (See Comments)   Myalgia, leg pains   Singulair [montelukast Sodium] Other (See Comments)   Affected mood   Statins Other (See Comments)   Myalgia, leg pains      Medication List    TAKE these medications    stroke: mapping our early stages of recovery book Misc 1 each by Does not apply route once for 1 dose.   acetaminophen 650 MG CR tablet Commonly known as: TYLENOL Take 1,300 mg by mouth every 8 (eight) hours as needed for pain.   albuterol 108 (90 Base) MCG/ACT inhaler Commonly known as: VENTOLIN HFA Inhale 1-2 puffs into the lungs every 6 (six) hours as needed for wheezing or shortness of breath.   ALPRAZolam 0.25 MG tablet Commonly known as: XANAX Take 0.25 mg by mouth 3 (three) times daily as needed for anxiety or sleep.   aspirin 81 MG EC tablet Take 1 tablet (81 mg total) by mouth daily. Swallow whole. Start taking on: March 17, 2020   budesonide-formoterol 160-4.5 MCG/ACT inhaler Commonly known as: SYMBICORT Inhale 2 puffs into the lungs daily.   buPROPion 150 MG 24 hr tablet Commonly known as:  WELLBUTRIN XL Take 150 mg by mouth daily.   CALCIUM-VITAMIN D PO Take 1 tablet by mouth daily.   cetirizine 10 MG tablet Commonly known as: ZYRTEC Take 10 mg by mouth daily.   clopidogrel 75 MG tablet Commonly known as: PLAVIX Take 1 tablet (75 mg total) by mouth daily for 21 days. Start taking on: March 17, 2020   docusate sodium 100 MG capsule Commonly known as: COLACE Take 1 capsule (100 mg total) by mouth 2 (two) times daily. What changed:   when to take this  reasons to take this   doxazosin 4 MG tablet Commonly known as: CARDURA Take 4 mg by mouth daily.   ezetimibe 10 MG tablet Commonly known as: ZETIA Take 1 tablet (10 mg total) by mouth daily after supper.   feeding supplement (ENSURE ENLIVE) Liqd Take 237 mLs by mouth 2 (two) times daily between meals. Start  taking on: March 17, 2020   furosemide 40 MG tablet Commonly known as: LASIX Take 40 mg by mouth daily.   gabapentin 300 MG capsule Commonly known as: NEURONTIN Take 1 capsule (300 mg total) by mouth 2 (two) times daily.   Investigational - Study Medication Take 1 tablet by mouth daily. Study name: [place] stroke study Additional study details: [id number] bottle: 5, 15, 25 mg or placebo   Investigational - Study Medication Take 1 tablet by mouth daily. Study name: [place] stroke study Additional study details: [id number]/green bottle: 5, 15, 25 mg or placebo tablet (PI - Sethi)   MULTIVITAMIN PO Take 1 tablet by mouth daily.   niacin 1000 MG CR tablet Commonly known as: NIASPAN Take 1,000 mg by mouth at bedtime.   nitroGLYCERIN 0.4 MG SL tablet Commonly known as: NITROSTAT Place 1 tablet (0.4 mg total) under the tongue every 5 (five) minutes as needed for chest pain.   olmesartan 40 MG tablet Commonly known as: BENICAR Take 40 mg by mouth every evening.   pantoprazole 40 MG tablet Commonly known as: PROTONIX Take 1 tablet (40 mg total) by mouth 2 (two) times daily. What changed:  when to take this   PARoxetine 40 MG tablet Commonly known as: PAXIL Take 40 mg by mouth every evening.   potassium chloride SA 20 MEQ tablet Commonly known as: KLOR-CON Take 20 mEq by mouth daily.   simvastatin 20 MG tablet Commonly known as: ZOCOR Take 1 tablet (20 mg total) by mouth 2 (two) times a week. Tue and Thur for 2 weeks and if tolerated three times a week.   traZODone 150 MG tablet Commonly known as: DESYREL Take 150 mg by mouth at bedtime.   vitamin B-12 1000 MCG tablet Commonly known as: CYANOCOBALAMIN Take 1,000 mcg by mouth daily.      Allergies  Allergen Reactions  . Amlodipine Other (See Comments)    Legs swell some and she doesn't like it.  . Fenofibrate Other (See Comments)    Leg pain  . Omeprazole Other (See Comments)    Unknown  . Simvastatin Other (See Comments)    Myalgia, leg pains  . Singulair [Montelukast Sodium] Other (See Comments)    Affected mood  . Statins Other (See Comments)    Myalgia, leg pains    Follow-up Information    Garvin Fila, MD Follow up.   Specialties: Neurology, Radiology Why: Office will call you to set up follow-up appointment. Contact information: [address] [place] [address] [phone]        Crist Infante, MD. Schedule an appointment as soon as possible for a visit in 2 month(s).   Specialty: Internal Medicine Contact information: [place]  [address] [phone]                The results of significant diagnostics from this hospitalization (including imaging, microbiology, ancillary and laboratory) are listed below for reference.    Significant Diagnostic Studies: CT HEAD WO CONTRAST  Result Date: 03/15/2020 CLINICAL DATA:  Weakness and lethargy after blood donation yesterday. Intermittent episodes of possible expressive aphasia and left arm weakness. EXAM: CT HEAD WITHOUT CONTRAST TECHNIQUE: Contiguous axial images were obtained from the base of  the skull through the vertex without intravenous contrast. COMPARISON:  None. FINDINGS: Brain: No evidence of acute infarction, hemorrhage, hydrocephalus, extra-axial collection or mass lesion/mass effect. Symmetric prominence of the ventricles, cisterns and sulci compatible with parenchymal volume loss. Patchy areas of white  matter hypoattenuation are most compatible with chronic microvascular angiopathy. Vascular: Atherosclerotic calcification of the carotid siphons. No hyperdense vessel. Skull: No calvarial fracture or suspicious osseous lesion. No scalp swelling or hematoma. Sinuses/Orbits: Paranasal sinuses and mastoid air cells are predominantly clear. Included orbital structures are unremarkable. Other: None IMPRESSION: 1. No acute intracranial findings. If there is persisting clinical concern for infarct, MRI is more sensitive and specific for early changes of ischemia. 2. Mild chronic microvascular angiopathy and parenchymal volume loss. Electronically Signed   By: Lovena Le M.D.   On: 03/15/2020 01:32   MR ANGIO HEAD WO CONTRAST  Result Date: 03/15/2020 CLINICAL DATA:  Stroke, follow-up. EXAM: MR CIRCLE OF WILLIS WITHOUT CONTRAST MRA OF THE NECK WITHOUT CONTRAST TECHNIQUE: Multiplanar, multiecho pulse sequences of the brain, circle of willis and surrounding structures were obtained without intravenous contrast. Angiographic images of the neck were obtained using MRA technique without and with intravenous contrast. COMPARISON:  Brain MRI performed earlier the same day 03/15/2020 FINDINGS: FINDINGS MR CIRCLE OF WILLIS FINDINGS The intracranial internal carotid arteries are patent without significant stenosis. There is a 2 mm inferiorly projecting vascular protrusion arising from the cavernous right ICA likely reflecting a small aneurysm (series 403, image 17). The M1 middle cerebral arteries are patent without significant stenosis. No M2 proximal branch occlusion or high-grade proximal stenosis is  identified. The anterior cerebral arteries are patent without significant proximal stenosis. The intracranial vertebral arteries are patent without significant stenosis, as is the basilar artery. The posterior cerebral arteries are patent proximally without significant stenosis. A left posterior communicating artery is present. The right posterior communicating artery is hypoplastic or absent. MRA NECK FINDINGS Noncontrast technique and motion degradation precludes adequate evaluation of the very proximal bilateral common carotid and vertebral arteries. Within this limitation, the visualized bilateral common carotid, internal carotid and vertebral arteries are patent within the neck without significant stenosis (50% or greater). IMPRESSION: MRA head: 1. No intracranial large vessel occlusion or proximal high-grade arterial stenosis. 2. 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 MRA neck: 1. Noncontrast technique and motion degradation precludes adequate evaluation of the very proximal bilateral common carotid and vertebral arteries. 2. Within this limitation, the visualized common carotid, internal carotid and vertebral arteries are patent within the neck without hemodynamically significant stenosis. Electronically Signed   By: Kellie Simmering DO   On: 03/15/2020 11:42   MR ANGIO NECK WO CONTRAST  Result Date: 03/15/2020 CLINICAL DATA:  Stroke, follow-up. EXAM: MR CIRCLE OF WILLIS WITHOUT CONTRAST MRA OF THE NECK WITHOUT CONTRAST TECHNIQUE: Multiplanar, multiecho pulse sequences of the brain, circle of willis and surrounding structures were obtained without intravenous contrast. Angiographic images of the neck were obtained using MRA technique without and with intravenous contrast. COMPARISON:  Brain MRI performed earlier the same day 03/15/2020 FINDINGS: FINDINGS MR CIRCLE OF WILLIS FINDINGS The intracranial internal carotid arteries are patent without significant stenosis.  There is a 2 mm inferiorly projecting vascular protrusion arising from the cavernous right ICA likely reflecting a small aneurysm (series 403, image 17). The M1 middle cerebral arteries are patent without significant stenosis. No M2 proximal branch occlusion or high-grade proximal stenosis is identified. The anterior cerebral arteries are patent without significant proximal stenosis. The intracranial vertebral arteries are patent without significant stenosis, as is the basilar artery. The posterior cerebral arteries are patent proximally without significant stenosis. A left posterior communicating artery is present. The right posterior communicating artery is hypoplastic or absent. MRA NECK  FINDINGS Noncontrast technique and motion degradation precludes adequate evaluation of the very proximal bilateral common carotid and vertebral arteries. Within this limitation, the visualized bilateral common carotid, internal carotid and vertebral arteries are patent within the neck without significant stenosis (50% or greater). IMPRESSION: MRA head: 1. No intracranial large vessel occlusion or proximal high-grade arterial stenosis. 2. 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 MRA neck: 1. Noncontrast technique and motion degradation precludes adequate evaluation of the very proximal bilateral common carotid and vertebral arteries. 2. Within this limitation, the visualized common carotid, internal carotid and vertebral arteries are patent within the neck without hemodynamically significant stenosis. Electronically Signed   By: Kellie Simmering DO   On: 03/15/2020 11:42   MR BRAIN WO CONTRAST  Result Date: 03/15/2020 CLINICAL DATA:  Stroke follow-up. EXAM: MRI HEAD WITHOUT CONTRAST TECHNIQUE: Multiecho pulse sequences of the brain and surrounding structures were obtained without intravenous contrast according to the research study protocol. DWI, SWI, T1, T2, and T2 FLAIR sequences were  obtained in the axial plane. COMPARISON:  Head MRI earlier today FINDINGS: The study is mildly motion degraded. Brain: An acute right paramedian pontine infarct is unchanged. No new infarct, intracranial hemorrhage, mass, midline shift, or extra-axial fluid collection is identified. T2 hyperintensities in the cerebral white matter bilaterally are unchanged and nonspecific but compatible with moderate chronic small vessel ischemic disease. Mild cerebral atrophy is within normal limits for age. Vascular: Major intracranial vascular flow voids are preserved. Skull and upper cervical spine: No suspicious marrow lesion. Sinuses/Orbits: Unremarkable orbits. Visualized portions of the paranasal sinuses and mastoid air cells are clear. Other: None. IMPRESSION: 1. Unchanged acute right paramedian pontine infarct. 2. No new intracranial abnormality. 3. Moderate chronic small vessel ischemic disease. Electronically Signed   By: Logan Bores M.D.   On: 03/15/2020 19:19   MR BRAIN WO CONTRAST  Result Date: 03/15/2020 CLINICAL DATA:  Episode of abnormal speech with left arm weakness. Weakness and lethargy after getting blood. EXAM: MRI HEAD WITHOUT CONTRAST TECHNIQUE: Multiplanar, multiecho pulse sequences of the brain and surrounding structures were obtained without intravenous contrast. COMPARISON:  CT head without contrast 03/15/2020 FINDINGS: Brain: The diffusion-weighted images demonstrate an acute/subacute nonhemorrhagic right paramedian pontine infarct no additional infarcts are present. Subtle T2 signal changes are associated. Periventricular and scattered subcortical T2 hyperintensities are moderately advanced for age. T2 signal changes are present within the corona radiata bilaterally. The ventricles are of normal size. No significant extraaxial fluid collection is present. The brainstem and cerebellum are within normal limits. Vascular: Flow is present in the major intracranial arteries. Skull and upper cervical  spine: The craniocervical junction is normal. Cervical spine hardware is noted at C4. Upper cervical spine is otherwise within normal limits. Marrow signal is unremarkable. Sinuses/Orbits: The paranasal sinuses and mastoid air cells are clear. The globes and orbits are within normal limits. IMPRESSION: 1. Acute/subacute nonhemorrhagic linear right paramedian pontine infarct. This represents a basilar perforator infarct. 2. Periventricular and scattered subcortical T2 hyperintensities bilaterally are moderately advanced for age. This likely reflects the sequela of chronic microvascular ischemia. These results were called by telephone at the time of interpretation on 03/15/2020 at 5:30 am to provider [facility] , who verbally acknowledged these results. Electronically Signed   By: San Morelle M.D.   On: 03/15/2020 05:30   ECHOCARDIOGRAM COMPLETE  Result Date: 03/15/2020    ECHOCARDIOGRAM REPORT   Patient Name:   OBELIA BONELLO Date of Exam: 03/15/2020 Medical Rec #:  [id number]     Height:       62.0 in Accession #:    [id number]    Weight:       194.0 lb Date of Birth:  10-06-1945     BSA:          1.887 m Patient Age:    53 years      BP:           187/67 mmHg Patient Gender: F             HR:           66 bpm. Exam Location:  Inpatient Procedure: 2D Echo Indications:    stroke 434. 91  History:        Patient has prior history of Echocardiogram examinations, most                 recent 04/25/2019. CAD; Risk Factors:Hypertension, Dyslipidemia                 and Former Smoker. MVP.  Sonographer:    [name] RDCS (AE) Referring Phys: [id number] PRAMOD S SETHI  Sonographer Comments: Image acquisition challenging due to patient body habitus. challenging windows IMPRESSIONS  1. Technically difficult echo with poor image quality.  2. Left ventricular ejection fraction, by estimation, is 65 to 70%. The left ventricle has normal function. The left ventricle has no regional wall motion abnormalities. Left ventricular  diastolic parameters are consistent with Grade I diastolic dysfunction (impaired relaxation).  3. Right ventricular systolic function is normal. The right ventricular size is normal.  4. The mitral valve is grossly normal. No evidence of mitral valve regurgitation.  5. The aortic valve is grossly normal. Aortic valve regurgitation is not visualized. No aortic stenosis is present.  6. The inferior vena cava is normal in size with greater than 50% respiratory variability, suggesting right atrial pressure of 3 mmHg. FINDINGS  Left Ventricle: Left ventricular ejection fraction, by estimation, is 65 to 70%. The left ventricle has normal function. The left ventricle has no regional wall motion abnormalities. The left ventricular internal cavity size was normal in size. There is  no left ventricular hypertrophy. Left ventricular diastolic parameters are consistent with Grade I diastolic dysfunction (impaired relaxation). Right Ventricle: The right ventricular size is normal. No increase in right ventricular wall thickness. Right ventricular systolic function is normal. Left Atrium: Left atrial size was normal in size. Right Atrium: Right atrial size was normal in size. Pericardium: There is no evidence of pericardial effusion. Mitral Valve: The mitral valve is grossly normal. No evidence of mitral valve regurgitation. Tricuspid Valve: The tricuspid valve is not well visualized. Tricuspid valve regurgitation is not demonstrated. Aortic Valve: The aortic valve is grossly normal. Aortic valve regurgitation is not visualized. No aortic stenosis is present. Pulmonic Valve: The pulmonic valve was not well visualized. Pulmonic valve regurgitation is not visualized. Aorta: The aortic root and ascending aorta are structurally normal, with no evidence of dilitation. Venous: The inferior vena cava is normal in size with greater than 50% respiratory variability, suggesting right atrial pressure of 3 mmHg. IAS/Shunts: The atrial septum  is grossly normal. Additional Comments: Technically difficult echo with poor image quality.  LEFT VENTRICLE PLAX 2D LVIDd:         4.24 cm  Diastology LVIDs:         2.72 cm  LV e' lateral:   8.59 cm/s LV PW:         1.00 cm  LV E/e' lateral: 7.2 LV IVS:        0.83 cm  LV e' medial:    7.83 cm/s LVOT diam:     2.20 cm  LV E/e' medial:  7.9 LV SV:         51 LV SV Index:   27 LVOT Area:     3.80 cm  LEFT ATRIUM         Index LA diam:    3.10 cm 1.64 cm/m  AORTIC VALVE LVOT Vmax:   54.20 cm/s LVOT Vmean:  40.300 cm/s LVOT VTI:    0.133 m  AORTA Ao Root diam: 2.40 cm MITRAL VALVE MV Area (PHT): 3.72 cm    SHUNTS MV Decel Time: 204 msec    Systemic VTI:  0.13 m MV E velocity: 61.70 cm/s  Systemic Diam: 2.20 cm MV A velocity: 76.70 cm/s MV E/A ratio:  0.80 Mertie Moores MD Electronically signed by Mertie Moores MD Signature Date/Time: 03/15/2020/4:00:51 PM    Final    SLEEP STUDY DOCUMENTS  Result Date: 03/16/2020 Ordered by an unspecified provider.   Microbiology: Recent Results (from the past 240 hour(s))  Urine culture     Status: Abnormal   Collection Time: 03/15/20  1:17 AM   Specimen: Urine, Random  Result Value Ref Range Status   Specimen Description URINE, RANDOM  Final   Special Requests   Final    NONE Performed at [facility], [address], [place], [place] [address]    Culture MULTIPLE SPECIES PRESENT, SUGGEST RECOLLECTION (A)  Final   Report Status 03/16/2020 FINAL  Final  SARS Coronavirus 2 by RT PCR (hospital order, performed in [facility] hospital lab) Nasopharyngeal Nasopharyngeal Swab     Status: None   Collection Time: 03/15/20  5:33 AM   Specimen: Nasopharyngeal Swab  Result Value Ref Range Status   SARS Coronavirus 2 NEGATIVE NEGATIVE Final    Comment: (NOTE) SARS-CoV-2 target nucleic acids are NOT DETECTED.  The SARS-CoV-2 RNA is generally detectable in upper and lower respiratory specimens during the acute phase of infection. The lowest concentration of  SARS-CoV-2 viral copies this assay can detect is 250 copies / mL. A negative result does not preclude SARS-CoV-2 infection and should not be used as the sole basis for treatment or other patient management decisions.  A negative result may occur with improper specimen collection / handling, submission of specimen other than nasopharyngeal swab, presence of viral mutation(s) within the areas targeted by this assay, and inadequate number of viral copies (<250 copies / mL). A negative result must be combined with clinical observations, patient history, and epidemiological information.  Fact Sheet for Patients:   StrictlyIdeas.no  Fact Sheet for Healthcare Providers: BankingDealers.co.za  This test is not yet approved or  cleared by the [place] FDA and has been authorized for detection and/or diagnosis of SARS-CoV-2 by FDA under an Emergency Use Authorization (EUA).  This EUA will remain in effect (meaning this test can be used) for the duration of the COVID-19 declaration under Section 564(b)(1) of the Act, 21 U.S.C. section 360bbb-3(b)(1), unless the authorization is terminated or revoked sooner.  Performed at [facility], [place] [address], [place], [place] [address]      Labs: Basic Metabolic Panel: Recent Labs  Lab 03/14/20 1413  NA 143  K 3.4*  CL 104  CO2 27  GLUCOSE 101*  BUN 20  CREATININE 1.49*  CALCIUM 9.0   Liver Function Tests: Recent  Labs  Lab 03/15/20 0146  AST 31  ALT 18  ALKPHOS 90  BILITOT 0.6  PROT 6.6  ALBUMIN 3.9   No results for input(s): LIPASE, AMYLASE in the last 168 hours. No results for input(s): AMMONIA in the last 168 hours. CBC: Recent Labs  Lab 03/14/20 1413  WBC 7.3  HGB 11.3*  HCT 37.7  MCV 84.5  PLT 231   Cardiac Enzymes: No results for input(s): CKTOTAL, CKMB, CKMBINDEX, TROPONINI in the last 168 hours. BNP: BNP (last 3 results) No results for input(s): BNP in  the last 8760 hours.  ProBNP (last 3 results) No results for input(s): PROBNP in the last 8760 hours.  CBG: Recent Labs  Lab 03/15/20 1650 03/15/20 2126 03/16/20 0738 03/16/20 1207 03/16/20 1605  GLUCAP 86 146* 95 83 96       Signed:  Annita Brod, MD [facility] 03/16/2020, 4:11 PM

## 2020-03-16 NOTE — Progress Notes (Signed)
Occupational Therapy Evaluation Patient Details Name: Shelley Spencer MRN: [id number] DOB: [birth year] Today's Date: 03/16/2020    History of Present Illness  Shelley Spencer is a 74 y.o. female with a history of hypertension, hyperlipidemia, prediabetes who presents with difficulty walking left-sided weakness started about 3:30 PM on 6/21.  She states that she had gone to donate blood, and when she returned she noticed she was off balance.  She was also slurring.  Due to the symptoms not improving, she sought care in the emergency department yesterday.  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   Clinical Impression   PTA, pt lives with husband and son in two level home (able to live on main level). Pt was Independent with ADLs, IADLs (shares some tasks with son), and was assisting husband at times. Pt was independent with mobility without AD. On OT entry, pt standing at sink brushing teeth without AD and properly managing IV pole. Pt completed the remainder of grooming tasks at sink Modified Independent. Assessed B UE strength with 4+/5 and no difference between right and left extremities. Guided pt in hallway distance mobility with IV pole at min guard progressing to close supervision to ensure safety. Mild unsteadiness with higher level balance challenges, but pt able to acknowledge and correct without prompts. Pt's daughter present during evaluation and reports noted improvement in pt's functioning. Daughter also reports son can assist at home, as well as other family members that live nearby. Based on current presentation, no skilled OT needs indicated at acute level or on discharged.     Follow Up Recommendations  No OT follow up;Supervision - Intermittent    Equipment Recommendations  None recommended by OT (has DME)    Recommendations for Other Services       Precautions / Restrictions Restrictions Weight Bearing Restrictions: No       Mobility Bed Mobility Overal bed mobility: Modified Independent                Transfers Overall transfer level: Modified independent Equipment used: None             General transfer comment: Pt Modified Independent for sit to stand and stand pivot transfers, managing IV pole     Balance Overall balance assessment: Mild deficits observed, not formally tested (one instance of stumbling, able to look left to right )                                         ADL either performed or assessed with clinical judgement   ADL Overall ADL's : Modified independent                                       General ADL Comments: Pt was brushing teeth standing at sink on OT entry, no major safety concerns noted. Pt able to independently manage IV pole and tele box in room without difficulty. Pt completed hallway distance mobility with IV pole, mild balance deficits during higher level challenges.      Vision Baseline Vision/History: No visual deficits Patient Visual Report: No change from baseline Vision Assessment?: No apparent visual deficits     Perception     Praxis      Pertinent Vitals/Pain Pain Assessment:  No/denies pain     Hand Dominance Right   Extremity/Trunk Assessment Upper Extremity Assessment Upper Extremity Assessment: Overall WFL for tasks assessed (grossly 4+/5 for grip strength, bicep, tricep, and shoulder)   Lower Extremity Assessment Lower Extremity Assessment: Defer to PT evaluation   Cervical / Trunk Assessment Cervical / Trunk Assessment: Normal   Communication Communication Communication: No difficulties   Cognition Arousal/Alertness: Awake/alert Behavior During Therapy: WFL for tasks assessed/performed Overall Cognitive Status: Within Functional Limits for tasks assessed                                     General Comments  Daughter present and reports pt appears to be moving better than  yesterday as her L LE appeared "heavy"    Exercises     Shoulder Instructions      Home Living Family/patient expects to be discharged to:: Private residence Living Arrangements: Spouse/significant other Available Help at Discharge: Family (son, husband) Type of Home: House Home Access: Level entry     Home Layout: Two level;Able to live on main level with bedroom/bathroom     Bathroom Shower/Tub: Occupational psychologist: Standard (with handles on side)     Home Equipment: Shower seat - built in;Shower seat;Walker - 2 wheels          Prior Functioning/Environment Level of Independence: Independent        Comments: Pt lives with elderly husband (49 y/o) who ambulates with walker and hx of heart transplant. Pt reports her and son assisting husband. Pt was Independent with ADLs, some IADLs. Son cooks meals and lives upstairs.         OT Problem List: Impaired balance (sitting and/or standing)      OT Treatment/Interventions:      OT Goals(Current goals can be found in the care plan section) Acute Rehab OT Goals Patient Stated Goal: get better and go home  OT Frequency:     Barriers to D/C:            Co-evaluation              AM-PAC OT "6 Clicks" Daily Activity     Outcome Measure Help from another person eating meals?: None Help from another person taking care of personal grooming?: None Help from another person toileting, which includes using toliet, bedpan, or urinal?: None Help from another person bathing (including washing, rinsing, drying)?: None Help from another person to put on and taking off regular upper body clothing?: None Help from another person to put on and taking off regular lower body clothing?: None 6 Click Score: 24   End of Session Equipment Utilized During Treatment: Gait belt Nurse Communication: Mobility status  Activity Tolerance: Patient tolerated treatment well Patient left: in bed;with call bell/phone within  reach;with family/visitor present  OT Visit Diagnosis: Other abnormalities of gait and mobility (R26.89)                Time: 0102-7253 OT Time Calculation (min): 23 min Charges:  OT General Charges $OT Visit: 1 Visit OT Evaluation $OT Eval Moderate Complexity: 1 Mod OT Treatments $Self Care/Home Management : 8-22 mins  Layla Maw, OTR/L  Layla Maw 03/16/2020, 9:09 AM

## 2020-03-19 ENCOUNTER — Other Ambulatory Visit: Payer: Self-pay | Admitting: Cardiology

## 2020-03-19 DIAGNOSIS — E7800 Pure hypercholesterolemia, unspecified: Secondary | ICD-10-CM

## 2020-03-19 DIAGNOSIS — I2510 Atherosclerotic heart disease of native coronary artery without angina pectoris: Secondary | ICD-10-CM

## 2020-03-20 ENCOUNTER — Ambulatory Visit: Payer: Medicare Other | Admitting: Obstetrics and Gynecology

## 2020-03-22 ENCOUNTER — Other Ambulatory Visit: Payer: Self-pay

## 2020-03-22 ENCOUNTER — Ambulatory Visit: Payer: Medicare Other | Attending: Internal Medicine

## 2020-03-22 VITALS — BP 128/78 | HR 92

## 2020-03-22 DIAGNOSIS — R2689 Other abnormalities of gait and mobility: Secondary | ICD-10-CM | POA: Insufficient documentation

## 2020-03-22 DIAGNOSIS — M6281 Muscle weakness (generalized): Secondary | ICD-10-CM | POA: Diagnosis present

## 2020-03-22 NOTE — Therapy (Signed)
[facility] [address] [place], [place], [address] Phone: [phone]   Fax:  [phone]  Physical Therapy Evaluation  Patient Details  Name: Shelley Spencer MRN: [id number] Date of Birth: 1945/09/25 Referring Provider (PT): Crist Infante   Encounter Date: 03/22/2020   PT End of Session - 03/22/20 1823    Visit Number 1    Number of Visits 9    Date for PT Re-Evaluation 30/07/62   90 day cert, 6 week poc due to scheduling/vacation   Authorization Type [employer] medicare so 10th visit progress note. FOTO? (power went out)    PT Start Time 1535    PT Stop Time 1618    PT Time Calculation (min) 43 min    Equipment Utilized During Treatment Gait belt    Activity Tolerance Patient tolerated treatment well    Behavior During Therapy WFL for tasks assessed/performed           Past Medical History:  Diagnosis Date  . Anemia   . Anxiety   . Arthritis   . Asthma   . Bicornuate uterus    double cervix  . CAD (coronary artery disease)   . Colon polyp   . Depression   . Deviated septum   . DJD (degenerative joint disease)    L-Spine  . Fibroids   . GERD (gastroesophageal reflux disease)   . Hematuria   . Hiatal hernia   . HNP (herniated nucleus pulposus)    lower back  . Hyperlipidemia   . Hypertension   . MVP (mitral valve prolapse)    pt denies, pt states she had a Echo ?2008 and it did not show MVP  . Pre-diabetes   . Retinal tear 2005   Small right retinal tear  . Shingles 1980's   on her waist    Past Surgical History:  Procedure Laterality Date  . ANTERIOR CERVICAL DECOMP/DISCECTOMY FUSION N/A 09/05/2017   Procedure: Cervical four-five, Cervical five-six, Cervical six-seven Anterior cervical decompression/discectomy/fusion;  Surgeon: Erline Levine, MD;  Location: [facility];  Service: Neurosurgery;  Laterality: N/A;  . bone spur surgery Bilateral    x 3  . CARDIOVASCULAR STRESS TEST  02/08/2014  . COLONOSCOPY     . CYST EXCISION Left 09/29/2018   Procedure: LEFT MIDDLE FINGER CYST REMOVAL AND DEBRIDEMENT OF DISTAL INTERPHALANGEAL JOINT;  Surgeon: Daryll Brod, MD;  Location: [facility];  Service: Orthopedics;  Laterality: Left;  . FINGER SURGERY Left    left third finger mucoid cyst removed  . FOOT NEUROMA SURGERY Bilateral   . HYSTEROSCOPY     D&C PMB Endo Cx Polyp  . KNEE ARTHROSCOPY WITH MENISCAL REPAIR Right 2016   --Flossie Dibble, MD  . LAPAROSCOPIC HYSTERECTOMY  7/209/10   R-TLH/BSO  Fibroids, BAck Pain, Adenomyosis  . LUMBAR FUSION    . removal vaginal septum    . TOTAL KNEE ARTHROPLASTY Right 05/17/2019   Procedure: TOTAL KNEE ARTHROPLASTY;  Surgeon: Gaynelle Arabian, MD;  Location: [facility];  Service: Orthopedics;  Laterality: Right;  82min  . 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 Left sided weakness, balance and slurred speech noted on 6/21. Pt reports that speech has improved quite a bit and she still notes some coordination issues in left hand. Feels  energy is not as good. 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 Had right TKR 04/2019 and does have history of cervical and lumbar fusions. Pt reports right knee has been bothering her more since CVA but did have pain prior.    Pertinent History PMH: obesity, HTN, hyperlipidemia. Right TKR 04/2019 and history of cervical and lumbar fusions.    Patient Stated Goals Pt wants to stop feeling like legs are rubbery and strengthen left hand.    Currently in Pain? Yes    Pain Score 1    worse 5/10.   Pain Location Knee    Pain Orientation Right    Pain Descriptors / Indicators Aching    Pain Type Acute pain;Chronic pain    Pain Onset More than a month ago    Pain Frequency Intermittent    Aggravating Factors   certain movements    Pain Relieving Factors rest    Multiple Pain Sites Yes    Pain Score 2    Pain Location Knee    Pain Orientation Left    Pain Descriptors / Indicators Aching    Pain Type Chronic pain    Pain Onset More than a month ago    Pain Frequency Intermittent              [facility] PT Assessment - 03/22/20 0001      Assessment   Medical Diagnosis CVA    Referring Provider (PT) Crist Infante    Onset Date/Surgical Date 03/13/20    Hand Dominance Right    Prior Therapy in hospital      Precautions   Precautions None      Balance Screen   Has the patient fallen in the past 6 months No    Has the patient had a decrease in activity level because of a fear of falling?  No    Is the patient reluctant to leave their home because of a fear of falling?  No      Home Environment   Living Environment Private residence    Living Arrangements Spouse/significant other;Children    Available Help at Discharge Family    Type of [place] Access Level entry    [place] Two level;Full bath on main level;Able to live on main level with bedroom/bathroom    Alternate Level Stairs-Number of Steps --   [place] - 2 wheels;Cane - single point;Grab bars - tub/shower    Additional Comments Pt lives with husband but she cares for him assisting with meds and keeping a watch on him as he tends to fall. Adult son lives with them with his son. He is home all the time and also helps.      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      Cognition   Overall Cognitive Status Within Functional Limits for tasks assessed      Sensation   Light Touch Appears Intact    Additional Comments has small numb area around right knee from TKR      Coordination     Gross Motor Movements are Fluid and Coordinated No   RAMs slowed on left  Fine Motor Movements are Fluid and Coordinated No   left finger opposition slowed.     Posture/Postural Control   Posture Comments rounded shoulders with forward head posture      ROM / Strength   AROM / PROM / Strength Strength      Strength   Strength Assessment Site Shoulder;Elbow;Hand;Hip;Knee;Ankle    Right/Left Shoulder Right;Left    Right Shoulder Flexion 5/5    Left Shoulder Flexion 4+/5    Right/Left Elbow Right;Left    Right Elbow Flexion 5/5    Right Elbow Extension 5/5    Left Elbow Flexion 5/5    Left Elbow Extension 5/5    Right/Left hand Right;Left    Right Hand Gross Grasp Functional    Left Hand Gross Grasp Functional    Right/Left Hip Right;Left    Right Hip Flexion 5/5    Left Hip Flexion 4/5    Right/Left Knee Right;Left    Right Knee Flexion 4+/5    Right Knee Extension 4+/5    Left Knee Flexion 4/5    Left Knee Extension 4/5    Right/Left Ankle Right;Left    Right Ankle Dorsiflexion 5/5    Left Ankle Dorsiflexion 5/5      Transfers   Transfers Sit to Stand;Stand to Sit    Sit to Stand 7: Independent    Five time sit to stand comments  12.44 sec without hands from chair    Stand to Sit 7: Independent      Ambulation/Gait   Ambulation/Gait Yes    Ambulation/Gait Assistance 5: Supervision    Ambulation/Gait Assistance Details Pt has slight decreased toe off right with slight right trunk lateral lean. (TKR side)    Ambulation Distance (Feet) 150 Feet    Assistive device None    Gait Pattern Step-through pattern;Decreased stance time - left    Ambulation Surface Level;Indoor    Gait velocity 10.2 sec=0.60m/s    Stairs Yes    Stairs Assistance 6: Modified independent (Device/Increase time)    Stair Management Technique Two rails;Alternating pattern    Number of Stairs 4      Functional Gait  Assessment   Gait assessed  Yes    Gait Level Surface Walks 20 ft in less than  7 sec but greater than 5.5 sec, uses assistive device, slower speed, mild gait deviations, or deviates 6-10 in outside of the 12 in walkway width.    Change in Gait Speed Able to change speed, demonstrates mild gait deviations, deviates 6-10 in outside of the 12 in walkway width, or no gait deviations, unable to achieve a major change in velocity, or uses a change in velocity, or uses an assistive device.    Gait with Horizontal Head Turns Performs head turns smoothly with slight change in gait velocity (eg, minor disruption to smooth gait path), deviates 6-10 in outside 12 in walkway width, or uses an assistive device.    Gait with Vertical Head Turns Performs task with slight change in gait velocity (eg, minor disruption to smooth gait path), deviates 6 - 10 in outside 12 in walkway width or uses assistive device    Gait and Pivot Turn Pivot turns safely within 3 sec and stops quickly with no loss of balance.    Step Over Obstacle Is able to step over 2 stacked shoe boxes taped together (9 in total height) without changing gait speed. No evidence of imbalance.    Gait with Narrow Base of Support Ambulates less than  4 steps heel to toe or cannot perform without assistance.    Gait with Eyes Closed Walks 20 ft, slow speed, abnormal gait pattern, evidence for imbalance, deviates 10-15 in outside 12 in walkway width. Requires more than 9 sec to ambulate 20 ft.    Ambulating Backwards Walks 20 ft, uses assistive device, slower speed, mild gait deviations, deviates 6-10 in outside 12 in walkway width.    Steps Alternating feet, must use rail.    Total Score 19                      Objective measurements completed on examination: See above findings.               PT Education - 03/22/20 2015    Education Details PT plan of care. Discussed gradually increasing walking starting 5-6 minutes and increasing every few days a minute. To monitor how she is feeling throughout.     Person(s) Educated Patient    Methods Explanation    Comprehension Verbalized understanding            PT Short Term Goals - 03/22/20 2026      PT SHORT TERM GOAL #1   Title Pt will be independent with initial HEP for strengthening and balance.    Time 3    Period Weeks    Status New    Target Date 04/12/20      PT SHORT TERM GOAL #2   Title Pt will decrease 5 x sit to stand from 12.44 sec to 10 sec for improved balance and functional strength.    Baseline 12.44 sec from chair without arms on 03/22/20    Time 3    Period Weeks    Status New    Target Date 04/12/20             PT Long Term Goals - 03/22/20 2028      PT LONG TERM GOAL #1   Title Pt will be independent with progressive HEP to be able to transfer to gym program and continue gains on own.    Time 6    Period Weeks    Status New    Target Date 05/03/20      PT LONG TERM GOAL #2   Title Pt will increase FGA from 19/30 to >24/30 for improved balance and decreased fall risk.    Baseline 19/30 on 03/22/20    Time 6    Period Weeks    Status New    Target Date 05/03/20      PT LONG TERM GOAL #3   Title Pt will ambulate >800' independently on varied surfaces for improved community mobility.    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 She has mild left hemiplegia. Has been more fatigued since CVA. Reports she still has mild left hand coordination issues and slurred speech. PT did suggest evals for OT and ST if pt would like and she will think about it. Did well with speech during PT eval today. Pt is fall risk based on FGA score of 19/30. Currently ambulating without AD with gait speed of 0.70m/s which is safe for community ambulator but decreased from  norms. Pt is reporting some increased knee pain since CVA but did have some prior as well  with recent right TKR in August of 2020. Pt will benefit from skilled PT to address strength, balance and functional mobility deficits.    Personal Factors and Comorbidities Comorbidity 3+    Comorbidities obesity, HTN, hyperlipidemia.    Examination-Activity Limitations Locomotion Level;Stairs    Examination-Participation Restrictions Community Activity    Stability/Clinical Decision Making Evolving/Moderate complexity    Clinical Decision Making Moderate    Rehab Potential Good    PT Frequency 2x / week   plus 1x/week for 4 weeks   PT Duration 2 weeks    PT Treatment/Interventions ADLs/Self Care Home Management;Moist Heat;Cryotherapy;Gait training;Stair training;Functional mobility training;Therapeutic activities;Neuromuscular re-education;Balance training;Therapeutic exercise;Patient/family education;Vestibular;Manual techniques;Passive range of motion    PT Next Visit Plan Establish initial strengthening and balance HEP. Focus on left hip and hamstring strength. Treadmill or Sci-Fit for aerobic activity that could transition to the gym.    Consulted and Agree with Plan of Care Patient           Patient will benefit from skilled therapeutic intervention in order to improve the following deficits and impairments:  Decreased activity tolerance, Abnormal gait, Decreased balance, Decreased endurance  Visit Diagnosis: Other abnormalities of gait and mobility  Muscle weakness (generalized)     Problem List Patient Active Problem List   Diagnosis Date Noted  . Left-sided weakness 03/16/2020  . CVA (cerebral vascular accident) ([place]) 03/15/2020  . OA (osteoarthritis) of knee 05/17/2019  . Herniated cervical disc without myelopathy 09/05/2017  . Bronchitis 04/16/2017  . Microcytosis 11/12/2013  . Screening for depression 11/12/2013  . Obesity 10/29/2012  . Underimmunization status 06/02/2012  . Herpes zona 03/01/2012  . LBP (low back pain) 02/27/2012  . Atypical chest pain  10/31/2011  . Overweight(278.02) 10/31/2011  . HLD (hyperlipidemia)   . Hypertension   . Fatigue 10/30/2011  . Laryngeal pain 09/05/2011  . Benign neoplasm of colon 08/14/2011  . Acute bronchitis 07/08/2011  . Allergic rhinitis, seasonal 11/30/2009  . Airway hyperreactivity 11/30/2009  . DDD (degenerative disc disease) 11/30/2009  . Clinical depression 11/30/2009  . Benign essential HTN 11/30/2009  . Acid reflux 11/30/2009  . Other and unspecified general psychiatric examination 11/30/2009  . Heart murmur, systolic 20/94/7096  . Elevated fasting blood sugar 11/30/2009    Electa Sniff, PT, DPT, NCS 03/22/2020, 8:32 PM  [facility] [address] [place], [place], [address] Phone: [phone]   Fax:  [phone]  Name: Shelley Spencer MRN: [id number] Date of Birth: [birth year]

## 2020-03-23 ENCOUNTER — Telehealth: Payer: Self-pay

## 2020-03-23 NOTE — Telephone Encounter (Signed)
I have sent her a message on My Chart

## 2020-03-28 ENCOUNTER — Other Ambulatory Visit: Payer: Self-pay | Admitting: *Deleted

## 2020-03-28 NOTE — Patient Outreach (Signed)
[name]) Care Management  03/28/2020  Shelley Spencer Jul 30, 1946 [id number]   RED ON EMMI ALERT - Stroke Day # 9 Date: 7/5 Red Alert Reason: Lost interest in things used to enjoy   Outreach attempt #1, successful, identity verified.  This care manager introduced self and stated purpose of call.  [facility] care management services explained.   She report she has been doing well since her discharge but have been resting a lot, not having much energy.  She is still able to perform all ADLs independently, able to drive self to follow up appointments.  Was seen by PCP today, waiting to be contacted by neurologist to schedule follow up.  She does have their contact information, will call them within the next couple days if they haven't contacted her.  Stated PT sessions last week.  Report that she still has interest in things but haven't been able to participate in certain things due to her energy level, but state it is better then when she was hospitalized.  Denies any urgent concerns, has son available for assistance if needed.   Plan: RN CM will not open case at this time, member encouraged to contact this care manager with questions.  Valente David, [place], MSN [place] [phone]

## 2020-03-31 ENCOUNTER — Other Ambulatory Visit: Payer: Self-pay

## 2020-03-31 ENCOUNTER — Ambulatory Visit: Payer: Medicare Other | Attending: Internal Medicine

## 2020-03-31 VITALS — BP 118/78

## 2020-03-31 DIAGNOSIS — M6281 Muscle weakness (generalized): Secondary | ICD-10-CM

## 2020-03-31 DIAGNOSIS — R2689 Other abnormalities of gait and mobility: Secondary | ICD-10-CM | POA: Diagnosis present

## 2020-03-31 NOTE — Therapy (Signed)
[facility] [address] [place], [place], [address] Phone: [phone]   Fax:  [phone]  Physical Therapy Treatment  Patient Details  Name: Shelley Spencer MRN: [id number] Date of Birth: [birth year] Referring Provider (PT): Crist Infante   Encounter Date: 03/31/2020   PT End of Session - 03/31/20 0852    Visit Number 2    Number of Visits 9    Date for PT Re-Evaluation 50/93/26   90 day cert, 6 week poc due to scheduling/vacation   Authorization Type [employer] medicare so 10th visit progress note. FOTO? (power went out)    PT Start Time 0848    PT Stop Time 0928    PT Time Calculation (min) 40 min    Equipment Utilized During Treatment Gait belt    Activity Tolerance Patient tolerated treatment well    Behavior During Therapy WFL for tasks assessed/performed           Past Medical History:  Diagnosis Date  . Anemia   . Anxiety   . Arthritis   . Asthma   . Bicornuate uterus    double cervix  . CAD (coronary artery disease)   . Colon polyp   . Depression   . Deviated septum   . DJD (degenerative joint disease)    L-Spine  . Fibroids   . GERD (gastroesophageal reflux disease)   . Hematuria   . Hiatal hernia   . HNP (herniated nucleus pulposus)    lower back  . Hyperlipidemia   . Hypertension   . MVP (mitral valve prolapse)    pt denies, pt states she had a Echo ?2008 and it did not show MVP  . Pre-diabetes   . Retinal tear 2005   Small right retinal tear  . Shingles 1980's   on her waist    Past Surgical History:  Procedure Laterality Date  . ANTERIOR CERVICAL DECOMP/DISCECTOMY FUSION N/A 09/05/2017   Procedure: Cervical four-five, Cervical five-six, Cervical six-seven Anterior cervical decompression/discectomy/fusion;  Surgeon: Erline Levine, MD;  Location: [facility];  Service: Neurosurgery;  Laterality: N/A;  . bone spur surgery Bilateral    x 3  . CARDIOVASCULAR STRESS TEST  02/08/2014  . COLONOSCOPY     . CYST EXCISION Left 09/29/2018   Procedure: LEFT MIDDLE FINGER CYST REMOVAL AND DEBRIDEMENT OF DISTAL INTERPHALANGEAL JOINT;  Surgeon: Daryll Brod, MD;  Location: [facility];  Service: Orthopedics;  Laterality: Left;  . FINGER SURGERY Left    left third finger mucoid cyst removed  . FOOT NEUROMA SURGERY Bilateral   . HYSTEROSCOPY     D&C PMB Endo Cx Polyp  . KNEE ARTHROSCOPY WITH MENISCAL REPAIR Right 2016   --Flossie Dibble, MD  . LAPAROSCOPIC HYSTERECTOMY  7/209/10   R-TLH/BSO  Fibroids, BAck Pain, Adenomyosis  . LUMBAR FUSION    . removal vaginal septum    . TOTAL KNEE ARTHROPLASTY Right 05/17/2019   Procedure: TOTAL KNEE ARTHROPLASTY;  Surgeon: Gaynelle Arabian, MD;  Location: [facility];  Service: Orthopedics;  Laterality: Right;  27min  . TUBAL LIGATION      Vitals:   03/31/20 0850  BP: 118/78     Subjective Assessment - 03/31/20 0850    Subjective Pt reports that she is doing pretty good today othe than it is early.. Pt reports that on Wednesday she was just really weak and tired.    Pertinent History PMH: obesity, HTN, hyperlipidemia. Right TKR 04/2019 and history of cervical and lumbar fusions.  Patient Stated Goals Pt wants to stop feeling like legs are rubbery and strengthen left hand.    Currently in Pain? Yes    Pain Score 0-No pain    Pain Location Knee    Pain Orientation Right    Pain Descriptors / Indicators --   just feel it   Pain Type Acute pain;Chronic pain    Pain Onset More than a month ago    Pain Onset More than a month ago                             [facility] Adult PT Treatment/Exercise - 03/31/20 6378      Ambulation/Gait   Ambulation/Gait Yes    Ambulation/Gait Assistance 5: Supervision    Assistive device None    Gait Comments Treadmill x 5 min at 1.75mph. 6/10 RPE at end. Pt was cued to increase left foot clearance as she would scuff at times. BP=140/82 after      Neuro Re-ed    Neuro Re-ed Details  Standing on  rockerboard positioned ant/post  maintaining level x 30 sec then with head turns left/right x 10 then rocking board with 1 finger support CGA. Repeated x 10 with head turns up/down. Denied any dizziness with this. Discussed moving slowly if has to bend down to pick up something and trying to squat more versus leaning all the way over.       Exercises   Exercises Other Exercises    Other Exercises  At counter: side stepping  without UE support 6' x 6, standing hamstring curl x 10 left then 10 x 2 with 3# ankle weight, left hip extension with 3# ankle weight 10 x 2 with verbal and tactile cues for form.                  PT Education - 03/31/20 1135    Education Details Started on initial strengthening HEP    Person(s) Educated Patient    Methods Explanation;Demonstration;Handout    Comprehension Verbalized understanding;Returned demonstration            PT Short Term Goals - 03/22/20 2026      PT SHORT TERM GOAL #1   Title Pt will be independent with initial HEP for strengthening and balance.    Time 3    Period Weeks    Status New    Target Date 04/12/20      PT SHORT TERM GOAL #2   Title Pt will decrease 5 x sit to stand from 12.44 sec to 10 sec for improved balance and functional strength.    Baseline 12.44 sec from chair without arms on 03/22/20    Time 3    Period Weeks    Status New    Target Date 04/12/20             PT Long Term Goals - 03/22/20 2028      PT LONG TERM GOAL #1   Title Pt will be independent with progressive HEP to be able to transfer to gym program and continue gains on own.    Time 6    Period Weeks    Status New    Target Date 05/03/20      PT LONG TERM GOAL #2   Title Pt will increase FGA from 19/30 to >24/30 for improved balance and decreased fall risk.    Baseline 19/30 on 03/22/20    Time 6  Period Weeks    Status New    Target Date 05/03/20      PT LONG TERM GOAL #3   Title Pt will ambulate >800' independently on varied  surfaces for improved community mobility.    Time 6    Period Weeks    Status New    Target Date 05/03/20                 Plan - 03/31/20 1135    Clinical Impression Statement Pt denied any signifcant pain in right knee during activities today. Did break up standing exercises to give right leg a break to not overdue it. Pt needed some cues for proper form with exercises but improved with cuing as long as she was thinking about it more. Pt did well on treadmill with did fatigue as went on with left foot scuffing a little more. Good BP response after gait.    Personal Factors and Comorbidities Comorbidity 3+    Comorbidities obesity, HTN, hyperlipidemia.    Examination-Activity Limitations Locomotion Level;Stairs    Examination-Participation Restrictions Community Activity    Stability/Clinical Decision Making Evolving/Moderate complexity    Rehab Potential Good    PT Frequency 2x / week   plus 1x/week for 4 weeks   PT Duration 2 weeks    PT Treatment/Interventions ADLs/Self Care Home Management;Moist Heat;Cryotherapy;Gait training;Stair training;Functional mobility training;Therapeutic activities;Neuromuscular re-education;Balance training;Therapeutic exercise;Patient/family education;Vestibular;Manual techniques;Passive range of motion    PT Next Visit Plan Continue to add to strengthening and balance HEP. Add some postural exercises as well. Discussed scapular retraction at end of session. Focus on left hip and hamstring strength. Treadmill or Sci-Fit for aerobic activity that could transition to the gym.    Consulted and Agree with Plan of Care Patient           Patient will benefit from skilled therapeutic intervention in order to improve the following deficits and impairments:  Decreased activity tolerance, Abnormal gait, Decreased balance, Decreased endurance  Visit Diagnosis: Other abnormalities of gait and mobility  Muscle weakness (generalized)     Problem List  Patient Active Problem List   Diagnosis Date Noted  . Left-sided weakness 03/16/2020  . CVA (cerebral vascular accident) ([facility]) 03/15/2020  . OA (osteoarthritis) of knee 05/17/2019  . Herniated cervical disc without myelopathy 09/05/2017  . Bronchitis 04/16/2017  . Microcytosis 11/12/2013  . Screening for depression 11/12/2013  . Obesity 10/29/2012  . Underimmunization status 06/02/2012  . Herpes zona 03/01/2012  . LBP (low back pain) 02/27/2012  . Atypical chest pain 10/31/2011  . Overweight(278.02) 10/31/2011  . HLD (hyperlipidemia)   . Hypertension   . Fatigue 10/30/2011  . Laryngeal pain 09/05/2011  . Benign neoplasm of colon 08/14/2011  . Acute bronchitis 07/08/2011  . Allergic rhinitis, seasonal 11/30/2009  . Airway hyperreactivity 11/30/2009  . DDD (degenerative disc disease) 11/30/2009  . Clinical depression 11/30/2009  . Benign essential HTN 11/30/2009  . Acid reflux 11/30/2009  . Other and unspecified general psychiatric examination 11/30/2009  . Heart murmur, systolic 76/19/5093  . Elevated fasting blood sugar 11/30/2009    Electa Sniff, PT, DPT, NCS 03/31/2020, 11:39 AM  [facility] [address] [place], [place], [address] Phone: [phone]   Fax:  [phone]  Name: Shelley Spencer MRN: [id number] Date of Birth: January 16, 1946

## 2020-03-31 NOTE — Patient Instructions (Signed)
Access Code: [email or web address] URL: [email or web address] Date: 03/31/2020 Prepared by: Cherly Anderson  Exercises Standing Knee Flexion AROM with Chair Support - 1 x daily - 5 x weekly - 2 sets - 10 reps Standing Hip Extension with Counter Support - 1 x daily - 5 x weekly - 2 sets - 10 reps Side Stepping with Counter Support - 1 x daily - 5 x weekly - 2 sets - 10 reps

## 2020-04-05 ENCOUNTER — Other Ambulatory Visit: Payer: Self-pay

## 2020-04-05 ENCOUNTER — Ambulatory Visit: Payer: Medicare Other | Admitting: Physical Therapy

## 2020-04-05 DIAGNOSIS — R2689 Other abnormalities of gait and mobility: Secondary | ICD-10-CM | POA: Diagnosis not present

## 2020-04-05 DIAGNOSIS — M6281 Muscle weakness (generalized): Secondary | ICD-10-CM

## 2020-04-06 NOTE — Therapy (Signed)
[facility] [address] [place], [place], [address] Phone: [phone]   Fax:  [phone]  Physical Therapy Treatment  Patient Details  Name: Shelley Spencer MRN: [id number] Date of Birth: 18-Aug-1946 Referring Provider (PT): Crist Infante   Encounter Date: 04/05/2020   PT End of Session - 04/06/20 2118    Visit Number 3    Number of Visits 9    Date for PT Re-Evaluation 27/74/12   90 day cert, 6 week poc due to scheduling/vacation   Authorization Type [employer] medicare so 10th visit progress note. FOTO? (power went out)    PT Start Time 1753    PT Stop Time 1832    PT Time Calculation (min) 39 min    Equipment Utilized During Treatment Gait belt    Activity Tolerance Patient limited by fatigue   SOB during session (vitals WNL), requires frequent seated rest breaks   Behavior During Therapy WFL for tasks assessed/performed           Past Medical History:  Diagnosis Date  . Anemia   . Anxiety   . Arthritis   . Asthma   . Bicornuate uterus    double cervix  . CAD (coronary artery disease)   . Colon polyp   . Depression   . Deviated septum   . DJD (degenerative joint disease)    L-Spine  . Fibroids   . GERD (gastroesophageal reflux disease)   . Hematuria   . Hiatal hernia   . HNP (herniated nucleus pulposus)    lower back  . Hyperlipidemia   . Hypertension   . MVP (mitral valve prolapse)    pt denies, pt states she had a Echo ?2008 and it did not show MVP  . Pre-diabetes   . Retinal tear 2005   Small right retinal tear  . Shingles 1980's   on her waist    Past Surgical History:  Procedure Laterality Date  . ANTERIOR CERVICAL DECOMP/DISCECTOMY FUSION N/A 09/05/2017   Procedure: Cervical four-five, Cervical five-six, Cervical six-seven Anterior cervical decompression/discectomy/fusion;  Surgeon: Erline Levine, MD;  Location: [facility];  Service: Neurosurgery;  Laterality: N/A;  . bone spur surgery Bilateral      x 3  . CARDIOVASCULAR STRESS TEST  02/08/2014  . COLONOSCOPY    . CYST EXCISION Left 09/29/2018   Procedure: LEFT MIDDLE FINGER CYST REMOVAL AND DEBRIDEMENT OF DISTAL INTERPHALANGEAL JOINT;  Surgeon: Daryll Brod, MD;  Location: [facility];  Service: Orthopedics;  Laterality: Left;  . FINGER SURGERY Left    left third finger mucoid cyst removed  . FOOT NEUROMA SURGERY Bilateral   . HYSTEROSCOPY     D&C PMB Endo Cx Polyp  . KNEE ARTHROSCOPY WITH MENISCAL REPAIR Right 2016   --Flossie Dibble, MD  . LAPAROSCOPIC HYSTERECTOMY  7/209/10   R-TLH/BSO  Fibroids, BAck Pain, Adenomyosis  . LUMBAR FUSION    . removal vaginal septum    . TOTAL KNEE ARTHROPLASTY Right 05/17/2019   Procedure: TOTAL KNEE ARTHROPLASTY;  Surgeon: Gaynelle Arabian, MD;  Location: [facility];  Service: Orthopedics;  Laterality: Right;  24min  . TUBAL LIGATION      There were no vitals filed for this visit.   Subjective Assessment - 04/05/20 1754    Subjective Really very tired today; almost didn't come in-called my doctor to see him on Friday.    Pertinent History PMH: obesity, HTN, hyperlipidemia. Right TKR 04/2019 and history of cervical and lumbar fusions.  Patient Stated Goals Pt wants to stop feeling like legs are rubbery and strengthen left hand.    Currently in Pain? No/denies    Pain Onset More than a month ago    Pain Onset More than a month ago                             [facility] Adult PT Treatment/Exercise - 04/05/20 1800      Transfers   Transfers Sit to Stand;Stand to Sit    Sit to Stand 7: Independent    Stand to Sit 7: Independent    Comments Multiple times sit<>stand throughout session due to seated rest breaks.      High Level Balance   High Level Balance Activities Side stepping;Marching forwards;Marching backwards;Tandem walking    High Level Balance Comments Sidestepping along counter, 3 reps R and L; then with added head turns 2 reps; marching forward/back 3 reps  along counter.; tandem gait along counter.  Progressed to tandem march along counter, 2 laps each.      Self-Care   Self-Care Other Self-Care Comments    Other Self-Care Comments  During seated rest breaks, discussed in regards to pt's fatigue and SOB in today's session:  energy conservation in regards to her functional mobility and ADLS/preventing falls and discusses preparing for MD visit to accurately describe her fatigue symptoms and how they are impacting/limiting participation in daily activities.  O2 sats 94%, HR 102 bpm, BP 90/67, then 118/82 at end of session.      Neuro Re-ed    Neuro Re-ed Details  Standing on [employer] in corner:  head turns x 5 reps, head nods x 5 reps EO, then EC x 10 seconds with UE support as needed.  Performed feet apart, then feet together, with min guard of therapist.              Therapeutic Exercise Reviewed HEP from last visit: Standing Knee Flexion AROM with Chair Support - 1 x daily - 5 x weekly - 2 sets - 10 reps Standing Hip Extension with Counter Support - 1 x daily - 5 x weekly - 2 sets - 10 reps Side Stepping with Counter Support - 1 x daily - 5 x weekly - 2 sets - 10 reps        Pt also performs hip abduction x 10 reps.  (Performed 1 sets of knee flexion and hip extension).  Pt return demo understanding, with occasional postural cues.     PT Education - 04/06/20 2118    Education Details See self-care    Person(s) Educated Patient    Methods Explanation    Comprehension Verbalized understanding            PT Short Term Goals - 03/22/20 2026      PT SHORT TERM GOAL #1   Title Pt will be independent with initial HEP for strengthening and balance.    Time 3    Period Weeks    Status New    Target Date 04/12/20      PT SHORT TERM GOAL #2   Title Pt will decrease 5 x sit to stand from 12.44 sec to 10 sec for improved balance and functional strength.    Baseline 12.44 sec from chair without arms on 03/22/20    Time 3    Period  Weeks    Status New    Target Date 04/12/20  PT Long Term Goals - 03/22/20 2028      PT LONG TERM GOAL #1   Title Pt will be independent with progressive HEP to be able to transfer to gym program and continue gains on own.    Time 6    Period Weeks    Status New    Target Date 05/03/20      PT LONG TERM GOAL #2   Title Pt will increase FGA from 19/30 to >24/30 for improved balance and decreased fall risk.    Baseline 19/30 on 03/22/20    Time 6    Period Weeks    Status New    Target Date 05/03/20      PT LONG TERM GOAL #3   Title Pt will ambulate >800' independently on varied surfaces for improved community mobility.    Time 6    Period Weeks    Status New    Target Date 05/03/20                 Plan - 04/06/20 2119    Clinical Impression Statement Reviewed HEP and addressed counter and corner balance exercises.  Did not focus on walking activities today, as pt required multiple seated rest breaks during standing activities due to fatigue and SOB.  Vitals WNL, and she is to see physician 04/07/2020.    Personal Factors and Comorbidities Comorbidity 3+    Comorbidities obesity, HTN, hyperlipidemia.    Examination-Activity Limitations Locomotion Level;Stairs    Examination-Participation Restrictions Community Activity    Stability/Clinical Decision Making Evolving/Moderate complexity    Rehab Potential Good    PT Frequency 2x / week   plus 1x/week for 4 weeks   PT Duration 2 weeks    PT Treatment/Interventions ADLs/Self Care Home Management;Moist Heat;Cryotherapy;Gait training;Stair training;Functional mobility training;Therapeutic activities;Neuromuscular re-education;Balance training;Therapeutic exercise;Patient/family education;Vestibular;Manual techniques;Passive range of motion    PT Next Visit Plan How did MD visit go on 7/16 in regards to fatigue? Continue to add to strengthening and balance HEP. Add some postural exercises as well, including  scapular retraction. Focus on left hip and hamstring strength. Treadmill or Sci-Fit for aerobic activity that could transition to the gym.    Consulted and Agree with Plan of Care Patient           Patient will benefit from skilled therapeutic intervention in order to improve the following deficits and impairments:  Decreased activity tolerance, Abnormal gait, Decreased balance, Decreased endurance  Visit Diagnosis: Muscle weakness (generalized)  Other abnormalities of gait and mobility     Problem List Patient Active Problem List   Diagnosis Date Noted  . Left-sided weakness 03/16/2020  . CVA (cerebral vascular accident) ([facility]) 03/15/2020  . OA (osteoarthritis) of knee 05/17/2019  . Herniated cervical disc without myelopathy 09/05/2017  . Bronchitis 04/16/2017  . Microcytosis 11/12/2013  . Screening for depression 11/12/2013  . Obesity 10/29/2012  . Underimmunization status 06/02/2012  . Herpes zona 03/01/2012  . LBP (low back pain) 02/27/2012  . Atypical chest pain 10/31/2011  . Overweight(278.02) 10/31/2011  . HLD (hyperlipidemia)   . Hypertension   . Fatigue 10/30/2011  . Laryngeal pain 09/05/2011  . Benign neoplasm of colon 08/14/2011  . Acute bronchitis 07/08/2011  . Allergic rhinitis, seasonal 11/30/2009  . Airway hyperreactivity 11/30/2009  . DDD (degenerative disc disease) 11/30/2009  . Clinical depression 11/30/2009  . Benign essential HTN 11/30/2009  . Acid reflux 11/30/2009  . Other and unspecified general psychiatric examination 11/30/2009  . Heart murmur, systolic  11/30/2009  . Elevated fasting blood sugar 11/30/2009    Dallie Patton W. 04/06/2020, 9:22 PM  Frazier Butt., PT   [facility] [address] [place], [place], [address] Phone: [phone]   Fax:  [phone]  Name: MANASI DISHON MRN: [id number] Date of Birth: Sep 11, 1946

## 2020-04-07 ENCOUNTER — Ambulatory Visit: Payer: Medicare Other

## 2020-04-17 ENCOUNTER — Other Ambulatory Visit: Payer: Self-pay

## 2020-04-17 ENCOUNTER — Encounter: Payer: Self-pay | Admitting: Physical Therapy

## 2020-04-17 ENCOUNTER — Ambulatory Visit: Payer: Medicare Other | Admitting: Physical Therapy

## 2020-04-17 VITALS — BP 138/70 | HR 104

## 2020-04-17 DIAGNOSIS — M6281 Muscle weakness (generalized): Secondary | ICD-10-CM

## 2020-04-17 DIAGNOSIS — R2689 Other abnormalities of gait and mobility: Secondary | ICD-10-CM | POA: Diagnosis not present

## 2020-04-17 NOTE — Therapy (Signed)
[facility] [address] [place], [place], [address] Phone: [phone]   Fax:  [phone]  Physical Therapy Treatment  Patient Details  Name: Shelley Spencer MRN: [id number] Date of Birth: Jun 17, 1946 Referring Provider (PT): Crist Infante   Encounter Date: 04/17/2020   PT End of Session - 04/17/20 1547    Visit Number 4    Number of Visits 9    Date for PT Re-Evaluation 44/01/02   90 day cert, 6 week poc due to scheduling/vacation   Authorization Type [employer] medicare so 10th visit progress note. FOTO? (power went out)    PT Start Time 1402    PT Stop Time 1445    PT Time Calculation (min) 43 min    Equipment Utilized During Treatment Gait belt    Activity Tolerance Patient limited by fatigue   SOB during session (vitals WNL), requires frequent seated rest breaks   Behavior During Therapy WFL for tasks assessed/performed           Past Medical History:  Diagnosis Date  . Anemia   . Anxiety   . Arthritis   . Asthma   . Bicornuate uterus    double cervix  . CAD (coronary artery disease)   . Colon polyp   . Depression   . Deviated septum   . DJD (degenerative joint disease)    L-Spine  . Fibroids   . GERD (gastroesophageal reflux disease)   . Hematuria   . Hiatal hernia   . HNP (herniated nucleus pulposus)    lower back  . Hyperlipidemia   . Hypertension   . MVP (mitral valve prolapse)    pt denies, pt states she had a Echo ?2008 and it did not show MVP  . Pre-diabetes   . Retinal tear 2005   Small right retinal tear  . Shingles 1980's   on her waist    Past Surgical History:  Procedure Laterality Date  . ANTERIOR CERVICAL DECOMP/DISCECTOMY FUSION N/A 09/05/2017   Procedure: Cervical four-five, Cervical five-six, Cervical six-seven Anterior cervical decompression/discectomy/fusion;  Surgeon: Erline Levine, MD;  Location: [facility];  Service: Neurosurgery;  Laterality: N/A;  . bone spur surgery Bilateral      x 3  . CARDIOVASCULAR STRESS TEST  02/08/2014  . COLONOSCOPY    . CYST EXCISION Left 09/29/2018   Procedure: LEFT MIDDLE FINGER CYST REMOVAL AND DEBRIDEMENT OF DISTAL INTERPHALANGEAL JOINT;  Surgeon: Daryll Brod, MD;  Location: [facility];  Service: Orthopedics;  Laterality: Left;  . FINGER SURGERY Left    left third finger mucoid cyst removed  . FOOT NEUROMA SURGERY Bilateral   . HYSTEROSCOPY     D&C PMB Endo Cx Polyp  . KNEE ARTHROSCOPY WITH MENISCAL REPAIR Right 2016   --Flossie Dibble, MD  . LAPAROSCOPIC HYSTERECTOMY  7/209/10   R-TLH/BSO  Fibroids, BAck Pain, Adenomyosis  . LUMBAR FUSION    . removal vaginal septum    . TOTAL KNEE ARTHROPLASTY Right 05/17/2019   Procedure: TOTAL KNEE ARTHROPLASTY;  Surgeon: Gaynelle Arabian, MD;  Location: [facility];  Service: Orthopedics;  Laterality: Right;  8mn  . TUBAL LIGATION      Vitals:   04/17/20 1412  BP: (!) 138/70  Pulse: 104     Subjective Assessment - 04/17/20 1404    Subjective Started taking statins in April. Still feeling very fatigued and muscles are hurting across her back and between her shoulder blades. Saw PCP last Friday, also said the fatigue  could be from the statins. Went to the beach last week, only went out there once because was afraid of falling and was feeling so weak that she couldn't do it. Goes to cardiologist on Wednesday. Feels like her stroke symptoms have subsided.    Pertinent History PMH: obesity, HTN, hyperlipidemia. Right TKR 04/2019 and history of cervical and lumbar fusions.    Patient Stated Goals Pt wants to stop feeling like legs are rubbery and strengthen left hand.    Currently in Pain? Yes    Pain Score 4     Pain Location --   between shoulder blades   Pain Descriptors / Indicators Aching    Pain Onset More than a month ago    Pain Relieving Factors "doing nothing", just sitting in recliner    Pain Onset More than a month ago                             [facility]  Adult PT Treatment/Exercise - 04/17/20 0001      Transfers   Transfers Sit to Stand;Stand to Sit    Sit to Stand 7: Independent    Five time sit to stand comments  12.28 seconds with no UE support from standard chair    Stand to Sit 7: Independent      Exercises   Exercises Other Exercises    Other Exercises  with BUE support on chair with use of red theraband for resistance: x10 reps B knee flexion, 2 x10 reps B hip extension with verbal and tactile cues for technique.x10 reps standing hip ABD - cues for proper technique and to keep a slight bend in stance leg. HR afterwards at 110-115 bpm. seated rest breaks between each. seated: scapular retraction 2 x 10 reps, cues for technique and to relax shoulders when performing, seated cervical retraction x10 reps with demo and verbal cues (verbally added to pt's HEP), will further review at next session                Balance Exercises - 04/17/20 0001      Balance Exercises: Standing   Stepping Strategy Posterior;Foam/compliant surface;10 reps;Limitations    Stepping Strategy Limitations with no UE support, cues for incr foot clearance    Sidestepping Foam/compliant support;3 reps;Limitations    Sidestepping Limitations down and back on foam beam x3 reps, cues for incr foot clearance             PT Education - 04/17/20 1552    Education Details reviewed HEP, progress towards goals.    Person(s) Educated Patient    Methods Explanation    Comprehension Verbalized understanding            PT Short Term Goals - 04/17/20 1419      PT SHORT TERM GOAL #1   Title Pt will be independent with initial HEP for strengthening and balance.    Baseline pt reports not performing HEP at home.    Time 3    Period Weeks    Status Not Met    Target Date 04/12/20      PT SHORT TERM GOAL #2   Title Pt will decrease 5 x sit to stand from 12.44 sec to 10 sec for improved balance and functional strength.    Baseline 12.44 sec from chair without  arms on 03/22/20, 12.28 seconds with no UE support from standard chair on 04/17/20    Time 3  Period Weeks    Status Not Met    Target Date 04/12/20             PT Long Term Goals - 03/22/20 2028      PT LONG TERM GOAL #1   Title Pt will be independent with progressive HEP to be able to transfer to gym program and continue gains on own.    Time 6    Period Weeks    Status New    Target Date 05/03/20      PT LONG TERM GOAL #2   Title Pt will increase FGA from 19/30 to >24/30 for improved balance and decreased fall risk.    Baseline 19/30 on 03/22/20    Time 6    Period Weeks    Status New    Target Date 05/03/20      PT LONG TERM GOAL #3   Title Pt will ambulate >800' independently on varied surfaces for improved community mobility.    Time 6    Period Weeks    Status New    Target Date 05/03/20                 Plan - 04/17/20 1552    Clinical Impression Statement Assessed pt's STGs, pt did not meet her 2 STGs. Pt has not consistently performed HEP at home, reviewed with pt today. Pt with no change in time in performing 5x sit <> stand with no UE support from chair (performed in 12.28 seconds). Pt needing multiple seated rest breaks between activities today due to SOB and fatigue. Vitals WNL throughout. Pt's HR incr to 110-115 bpm after standing activitiy and decr back to 95-100 bpm with seated rest breaks, O2 sats remained at 97% and above throughout. Pt to see cardiologist later this week. Will continue to progress towards LTGs.    Personal Factors and Comorbidities Comorbidity 3+    Comorbidities obesity, HTN, hyperlipidemia.    Examination-Activity Limitations Locomotion Level;Stairs    Examination-Participation Restrictions Community Activity    Stability/Clinical Decision Making Evolving/Moderate complexity    Rehab Potential Good    PT Frequency 2x / week   plus 1x/week for 4 weeks   PT Duration 2 weeks    PT Treatment/Interventions ADLs/Self Care Home  Management;Moist Heat;Cryotherapy;Gait training;Stair training;Functional mobility training;Therapeutic activities;Neuromuscular re-education;Balance training;Therapeutic exercise;Patient/family education;Vestibular;Manual techniques;Passive range of motion    PT Next Visit Plan how to cardiologist go? Continue to add to strengthening and balance HEP. Add some postural exercises as well, including scapular retraction. Focus on left hip and hamstring strength. Treadmill or Sci-Fit for aerobic activity that could transition to the gym.    Consulted and Agree with Plan of Care Patient           Patient will benefit from skilled therapeutic intervention in order to improve the following deficits and impairments:  Decreased activity tolerance, Abnormal gait, Decreased balance, Decreased endurance  Visit Diagnosis: Muscle weakness (generalized)  Other abnormalities of gait and mobility     Problem List Patient Active Problem List   Diagnosis Date Noted  . Left-sided weakness 03/16/2020  . CVA (cerebral vascular accident) ([facility]) 03/15/2020  . OA (osteoarthritis) of knee 05/17/2019  . Herniated cervical disc without myelopathy 09/05/2017  . Bronchitis 04/16/2017  . Microcytosis 11/12/2013  . Screening for depression 11/12/2013  . Obesity 10/29/2012  . Underimmunization status 06/02/2012  . Herpes zona 03/01/2012  . LBP (low back pain) 02/27/2012  . Atypical chest pain 10/31/2011  . Overweight(278.02) 10/31/2011  .  HLD (hyperlipidemia)   . Hypertension   . Fatigue 10/30/2011  . Laryngeal pain 09/05/2011  . Benign neoplasm of colon 08/14/2011  . Acute bronchitis 07/08/2011  . Allergic rhinitis, seasonal 11/30/2009  . Airway hyperreactivity 11/30/2009  . DDD (degenerative disc disease) 11/30/2009  . Clinical depression 11/30/2009  . Benign essential HTN 11/30/2009  . Acid reflux 11/30/2009  . Other and unspecified general psychiatric examination 11/30/2009  . Heart murmur, systolic  62/69/4854  . Elevated fasting blood sugar 11/30/2009    Arliss Journey, PT, DPT  04/17/2020, 3:55 PM  [facility] [address] [place], [place], [address] Phone: [phone]   Fax:  [phone]  Name: Shelley Spencer MRN: [id number] Date of Birth: Mar 25, 1946

## 2020-04-18 ENCOUNTER — Ambulatory Visit
Admission: RE | Admit: 2020-04-18 | Discharge: 2020-04-18 | Disposition: A | Discharge status: Home / Self Care | Payer: Medicare Other | Source: Ambulatory Visit | Attending: Internal Medicine | Admitting: Internal Medicine

## 2020-04-18 ENCOUNTER — Other Ambulatory Visit: Payer: Self-pay | Admitting: Internal Medicine

## 2020-04-18 DIAGNOSIS — R0600 Dyspnea, unspecified: Secondary | ICD-10-CM

## 2020-04-18 DIAGNOSIS — R0609 Other forms of dyspnea: Secondary | ICD-10-CM

## 2020-04-18 MED ORDER — IOPAMIDOL (ISOVUE-370) INJECTION 76%
60.0000 mL | Freq: Once | INTRAVENOUS | Status: AC | PRN
  Administered 2020-04-18: 60 mL via INTRAVENOUS

## 2020-04-19 ENCOUNTER — Encounter: Payer: Self-pay | Admitting: Cardiology

## 2020-04-19 ENCOUNTER — Ambulatory Visit: Payer: Medicare Other | Admitting: Cardiology

## 2020-04-19 ENCOUNTER — Other Ambulatory Visit: Payer: Self-pay

## 2020-04-19 VITALS — BP 109/72 | HR 100 | Resp 17 | Ht 62.0 in | Wt 189.0 lb

## 2020-04-19 DIAGNOSIS — I351 Nonrheumatic aortic (valve) insufficiency: Secondary | ICD-10-CM

## 2020-04-19 DIAGNOSIS — E7800 Pure hypercholesterolemia, unspecified: Secondary | ICD-10-CM

## 2020-04-19 DIAGNOSIS — Z789 Other specified health status: Secondary | ICD-10-CM

## 2020-04-19 DIAGNOSIS — I6350 Cerebral infarction due to unspecified occlusion or stenosis of unspecified cerebral artery: Secondary | ICD-10-CM

## 2020-04-19 DIAGNOSIS — I10 Essential (primary) hypertension: Secondary | ICD-10-CM

## 2020-04-19 DIAGNOSIS — I2510 Atherosclerotic heart disease of native coronary artery without angina pectoris: Secondary | ICD-10-CM

## 2020-04-19 MED ORDER — ICOSAPENT ETHYL 1 G PO CAPS: 2.0000 g | ORAL_CAPSULE | Freq: Two times a day (BID) | ORAL | 6 refills | Status: DC

## 2020-04-19 MED ORDER — EZETIMIBE 10 MG PO TABS: 10.0000 mg | ORAL_TABLET | Freq: Every day | ORAL | 3 refills | Status: DC

## 2020-04-19 MED ORDER — SIMVASTATIN 20 MG PO TABS: 20.0000 mg | ORAL_TABLET | ORAL | 0 refills | Status: DC

## 2020-04-19 MED ORDER — REPATHA 140 MG/ML ~~LOC~~ SOSY: 1.0000 | PREFILLED_SYRINGE | SUBCUTANEOUS | 3 refills | Status: DC

## 2020-04-19 NOTE — Progress Notes (Signed)
Primary Physician:  Crist Infante, MD   Patient ID: Shelley Spencer, female    DOB: 1945/12/15, 74 y.o.   MRN: [id number]  Subjective:    Chief Complaint  Patient presents with  . Coronary Artery Disease  . DOE  . Fatigue  . Follow-up    HPI: Shelley Spencer  is a 74 y.o. female  with hypertension, hyperlipidemia, hyperglycemia, CKD stage 3, former tobacco use with moderate CAD with LAD 50-60% stenosis by coronary CTA on 03/30/2019  (not significant stenosis by FFR), moderate AR by echo in July 2020.  She is a former smoker that quit in 1981. Patient was admitted to the hospital on 03/14/2020 with gait disturbance involving her left lower extremity, had a right pontine stroke.  Fortunately she has recuperated well from this.  MRI showed moderate chronic small vessel disease.  She does care for her elderly husband that has had heart transplant and has a lot of stress related to this. She is statin intolerant and presently on Niacin and tolerates this well.  Although she had marked hyperlipidemia, with addition of simvastatin 20 mg along with Zetia, she had a very good response with reduction in the LDL by greater than 50% reduction to around 77 mg.  However she discontinued this recently due to severe myalgias and fatigue and thinks that she should go on Repatha or Praluent.  Past Medical History:  Diagnosis Date  . Anemia   . Anxiety   . Arthritis   . Asthma   . Bicornuate uterus    double cervix  . CAD (coronary artery disease)   . Colon polyp   . Depression   . Deviated septum   . DJD (degenerative joint disease)    L-Spine  . Fibroids   . GERD (gastroesophageal reflux disease)   . Hematuria   . Hiatal hernia   . HNP (herniated nucleus pulposus)    lower back  . Hyperlipidemia   . Hypertension   . MVP (mitral valve prolapse)    pt denies, pt states she had a Echo ?2008 and it did not show MVP  . Pre-diabetes   . Retinal tear 2005   Small right retinal tear  . Shingles  1980's   on her waist    Past Surgical History:  Procedure Laterality Date  . ANTERIOR CERVICAL DECOMP/DISCECTOMY FUSION N/A 09/05/2017   Procedure: Cervical four-five, Cervical five-six, Cervical six-seven Anterior cervical decompression/discectomy/fusion;  Surgeon: Erline Levine, MD;  Location: [facility];  Service: Neurosurgery;  Laterality: N/A;  . bone spur surgery Bilateral    x 3  . CARDIOVASCULAR STRESS TEST  02/08/2014  . COLONOSCOPY    . CYST EXCISION Left 09/29/2018   Procedure: LEFT MIDDLE FINGER CYST REMOVAL AND DEBRIDEMENT OF DISTAL INTERPHALANGEAL JOINT;  Surgeon: Daryll Brod, MD;  Location: [facility];  Service: Orthopedics;  Laterality: Left;  . FINGER SURGERY Left    left third finger mucoid cyst removed  . FOOT NEUROMA SURGERY Bilateral   . HYSTEROSCOPY     D&C PMB Endo Cx Polyp  . KNEE ARTHROSCOPY WITH MENISCAL REPAIR Right 2016   --Flossie Dibble, MD  . LAPAROSCOPIC HYSTERECTOMY  7/209/10   R-TLH/BSO  Fibroids, BAck Pain, Adenomyosis  . LUMBAR FUSION    . removal vaginal septum    . TOTAL KNEE ARTHROPLASTY Right 05/17/2019   Procedure: TOTAL KNEE ARTHROPLASTY;  Surgeon: Gaynelle Arabian, MD;  Location: [facility];  Service: Orthopedics;  Laterality: Right;  37mn  .  TUBAL LIGATION     Social History   Tobacco Use  . Smoking status: Former Smoker    Packs/day: 1.00    Years: 20.00    Pack years: 20.00    Types: Cigarettes    Quit date: 09/24/1979    Years since quitting: 40.6  . Smokeless tobacco: Never Used  Substance Use Topics  . Alcohol use: Not Currently    Alcohol/week: 1.0 standard drink    Types: 1 Standard drinks or equivalent per week    Marital status: Married   Review of Systems  Cardiovascular: Negative for chest pain, dyspnea on exertion and leg swelling.  Gastrointestinal: Negative for melena.   Objective:  Blood pressure 109/72, pulse 100, resp. rate 17, height 5' 2"  (1.575 m), weight 189 lb (85.7 kg), SpO2 96 %. Body mass index is  34.57 kg/m.   Vitals with BMI 04/19/2020 04/17/2020 03/31/2020  Height 5' 2"  - -  Weight 189 lbs - -  BMI 52.77 - -  Systolic 824 235 361  Diastolic 72 70 78  Pulse 443 104 -      Physical Exam Vitals reviewed.  Constitutional:      Appearance: She is well-developed.     Comments: Moderately obese  Cardiovascular:     Rate and Rhythm: Normal rate and regular rhythm.     Pulses: Intact distal pulses.     Heart sounds: Murmur heard. High-pitched decrescendo early diastolic murmur is present with a grade of 2/4 at the upper right sternal border.      Comments: No JVD. No leg edema. Pulmonary:     Effort: Pulmonary effort is normal. No accessory muscle usage or respiratory distress.     Breath sounds: Normal breath sounds.  Abdominal:     General: Bowel sounds are normal.     Palpations: Abdomen is soft.  Musculoskeletal:        General: Normal range of motion.  Psychiatric:        Behavior: Behavior is cooperative.    Radiology: No results found. MR angio of the neck and head 03/15/2020 without contrast: 1. No intracranial large vessel occlusion or proximal high-grade arterial stenosis. 2. 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 3. Acute right paramedian pontine infarct.  Laboratory examination:    CMP Latest Ref Rng & Units 03/15/2020 03/14/2020 05/18/2019  Glucose 70 - 99 mg/dL - 101(H) 164(H)  BUN 8 - 23 mg/dL - 20 17  Creatinine 0.44 - 1.00 mg/dL - 1.49(H) 1.08(H)  Sodium 135 - 145 mmol/L - 143 138  Potassium 3.5 - 5.1 mmol/L - 3.4(L) 4.5  Chloride 98 - 111 mmol/L - 104 108  CO2 22 - 32 mmol/L - 27 24  Calcium 8.9 - 10.3 mg/dL - 9.0 8.6(L)  Total Protein 6.5 - 8.1 g/dL 6.6 - -  Total Bilirubin 0.3 - 1.2 mg/dL 0.6 - -  Alkaline Phos 38 - 126 U/L 90 - -  AST 15 - 41 U/L 31 - -  ALT 0 - 44 U/L 18 - -   CBC Latest Ref Rng & Units 03/14/2020 05/18/2019 09/02/2017  WBC 4.0 - 10.5 K/uL 7.3 10.2 6.4  Hemoglobin 12.0 - 15.0  g/dL 11.3(L) 10.5(L) 13.4  Hematocrit 36 - 46 % 37.7 34.4(L) 41.5  Platelets 150 - 400 K/uL 231 217 232   Lipid Panel Recent Labs    12/28/19 1452 03/15/20 0803  CHOL 211* 160  TRIG 282* 101  LDLCALC 95 77  VLDL  --  20  HDL 69 63  CHOLHDL 3.1 2.5     HEMOGLOBIN A1C Lab Results  Component Value Date   HGBA1C 6.1 (H) 03/15/2020   MPG 128.37 03/15/2020   TSH Recent Labs    03/15/20 0853  TSH 2.500     Vitamin D 1, 25 (OH)2 Total 12/28/2019 pg/mL 34     External labs:  05/07/2019: Creatinine 1.1, EGFR 49/59, potassium 4.1, BMP normal.  Normal H&H, MCH low at 25.5, MCHC 30.0, CBC otherwise normal.  Cholesterol 203, triglycerides 107, HDL 64, LDL 118. TSH normal.   PRN Meds:. Medications Discontinued During This Encounter  Medication Reason  . feeding supplement, ENSURE ENLIVE, (ENSURE ENLIVE) LIQD Patient Preference  . gabapentin (NEURONTIN) 300 MG capsule Patient Preference  . niacin (NIASPAN) 1000 MG CR tablet Discontinued by provider  . ezetimibe (ZETIA) 10 MG tablet Reorder  . simvastatin (ZOCOR) 20 MG tablet Reorder  . Evolocumab (REPATHA) 388 MG/ML SOSY Duplicate   Current Meds  Medication Sig  . albuterol (VENTOLIN HFA) 108 (90 Base) MCG/ACT inhaler Inhale 1-2 puffs into the lungs every 6 (six) hours as needed for wheezing or shortness of breath.   Marland Kitchen aspirin EC 81 MG EC tablet Take 1 tablet (81 mg total) by mouth daily. Swallow whole.  . budesonide-formoterol (SYMBICORT) 160-4.5 MCG/ACT inhaler Inhale 2 puffs into the lungs daily.   Marland Kitchen buPROPion (WELLBUTRIN XL) 150 MG 24 hr tablet Take 150 mg by mouth daily.  Marland Kitchen CALCIUM-VITAMIN D PO Take 1 tablet by mouth daily.  . cetirizine (ZYRTEC) 10 MG tablet Take 10 mg by mouth daily.  Marland Kitchen docusate sodium (COLACE) 100 MG capsule Take 1 capsule (100 mg total) by mouth 2 (two) times daily. (Patient taking differently: Take 100 mg by mouth daily as needed for moderate constipation. )  . doxazosin (CARDURA) 4 MG tablet Take 4  mg by mouth daily.   . furosemide (LASIX) 40 MG tablet Take 40 mg by mouth daily.  . Investigational - Study Medication Take 1 tablet by mouth daily. Study name: [place] stroke study Additional study details: [id number] bottle: 5, 15, 25 mg or placebo  . Investigational - Study Medication Take 1 tablet by mouth daily. Study name: [place] stroke study Additional study details: [id number]/green bottle: 5, 15, 25 mg or placebo tablet (PI - Sethi)  . Multiple Vitamins-Minerals (MULTIVITAMIN PO) Take 1 tablet by mouth daily.  Marland Kitchen olmesartan (BENICAR) 40 MG tablet Take 40 mg by mouth every evening.  . pantoprazole (PROTONIX) 40 MG tablet Take 1 tablet (40 mg total) by mouth 2 (two) times daily. (Patient taking differently: Take 40 mg by mouth every evening. )  . PARoxetine (PAXIL) 40 MG tablet Take 40 mg by mouth every evening.   . potassium chloride SA (K-DUR) 20 MEQ tablet Take 20 mEq by mouth daily.   . traZODone (DESYREL) 150 MG tablet Take 150 mg by mouth at bedtime.  . vitamin B-12 (CYANOCOBALAMIN) 1000 MCG tablet Take 1,000 mcg by mouth daily.   Cardiac Studies:   Coronary CTA 03/30/2019:  1. Coronary calcium score of 33. This was 19 percentile for age and sex matched control. 2. Normal coronary origin with left dominance. 3. Moderate plaque with stenosis 50-69% in the mid LAD. CAD RADS 3. Additional analysis with CT FFR will be submitted.  Coronary CTA FFR 03/30/2019:  1. Left Main: No significant stenosis. 2. LAD: No significant stenosis. 3. LCX: No significant stenosis. 4. RCA: No significant stenosis. IMPRESSION: 1.  CT FFR analysis didn't show any significant stenosis.  Echocardiogram 03/15/2020  1. Technically difficult echo with poor image quality.  2. Left ventricular ejection fraction, by estimation, is 65 to 70%. The  left ventricle has normal function. The left ventricle has no regional  wall motion abnormalities. Left ventricular diastolic parameters are  consistent  with Grade I diastolic dysfunction (impaired relaxation).  3. Right ventricular systolic function is normal. The right ventricular  size is normal.  4. The mitral valve is grossly normal. No evidence of mitral valve  regurgitation.  5. The aortic valve is grossly normal. Aortic valve regurgitation is not  visualized. No aortic stenosis is present.  Compared to 04/23/2019, moderate aortic regurgitation could not be visualized due to poor echo window.  Otherwise no significant change  EKG:  EKG 03/15/2019: Normal sinus rhythm at 90 bpm, left atrial enlargement, left axis deviation, no evidence of ischemia.  Assessment:     ICD-10-CM   1. Atherosclerosis of native coronary artery of native heart without angina pectoris  I25.10 simvastatin (ZOCOR) 20 MG tablet    icosapent Ethyl (VASCEPA) 1 g capsule    Evolocumab (REPATHA SURECLICK) 245 MG/ML SOAJ    DISCONTINUED: Evolocumab (REPATHA) 140 MG/ML SOSY  2. Hypercholesteremia  E78.00 ezetimibe (ZETIA) 10 MG tablet    simvastatin (ZOCOR) 20 MG tablet    Lipoprotein A (LPA)    Lipid Panel With LDL/HDL Ratio    Lipid Panel With LDL/HDL Ratio    Lipoprotein A (LPA)    Evolocumab SOSY 140 mg    Evolocumab (REPATHA SURECLICK) 809 MG/ML SOAJ    DISCONTINUED: Evolocumab (REPATHA) 140 MG/ML SOSY  3. Benign essential HTN  I10   4. Moderate aortic regurgitation  I35.1   5. Right pontine stroke (HCC)  I63.50 icosapent Ethyl (VASCEPA) 1 g capsule    Evolocumab (REPATHA SURECLICK) 983 MG/ML SOAJ    DISCONTINUED: Evolocumab (REPATHA) 140 MG/ML SOSY  6. Statin intolerance  Z78.9    Administration Action Time Recorded Time Documented By Site Comment Reason Patient Supplied  Given : 140 mg :  : Subcutaneous 04/19/20 1036 04/20/20 1039 HatfieldEartha Inch, CMA Left Anterior Thigh NCD: [id number]  No    Recommendations:   Shelley Spencer  is a 74 y.o. female  with hypertension, hyperlipidemia, hyperglycemia, CKD stage 3, former tobacco use with  moderate CAD with LAD 50-60% stenosis by coronary CTA on 03/30/2019  (not significant stenosis by FFR), moderate AR by echo in July 2020.  She is a former smoker that quit in 1981.  Patient was admitted to the hospital on 03/14/2020 with gait disturbance involving her left lower extremity, had a right pontine stroke.  Fortunately she has recuperated well from this.  MRI showed moderate chronic small vessel disease.  She is statin intolerant and presently on Niacin and tolerates this well.  Discontinued Simvastatin and Zetia due to severe myalgia although by taking it 3 times a week she has had remarkable decrease in LDL.   In view of statin intolerance, recent stroke and small vessel disease involving the intracranial vessels, coronary artery disease, I have started her on Repatha 140 mg SQ q. 14 days.  We will do prior authorization.  In view of elevated triglycerides, known coronary disease, I will also start her on Vascepa 2 g twice daily and discontinue niacin due to efficacy and poor data with regard to CVA.  Blood pressure is well controlled, with regard to aortic regurgitation, no clinical evidence of heart failure  and recently performed echocardiogram from the hospital reviewed.  I would like to see her back in 6 weeks, will obtain lipid profile testing along with LPA prior to her visit.   Adrian Prows, MD, [facility] 04/22/2020, 3:28 PM Office: [phone]

## 2020-04-20 MED ORDER — EVOLOCUMAB 140 MG/ML ~~LOC~~ SOSY
140.0000 mg | PREFILLED_SYRINGE | Freq: Once | SUBCUTANEOUS | Status: AC
  Administered 2020-04-19: 140 mg via SUBCUTANEOUS

## 2020-04-21 ENCOUNTER — Ambulatory Visit: Payer: Medicare Other | Admitting: Physical Therapy

## 2020-04-21 ENCOUNTER — Other Ambulatory Visit: Payer: Self-pay

## 2020-04-21 DIAGNOSIS — R2689 Other abnormalities of gait and mobility: Secondary | ICD-10-CM

## 2020-04-21 DIAGNOSIS — M6281 Muscle weakness (generalized): Secondary | ICD-10-CM

## 2020-04-21 NOTE — Therapy (Signed)
[facility] [address] [place], [place], [address] Phone: [phone]   Fax:  [phone]  Physical Therapy Treatment  Patient Details  Name: Shelley Spencer MRN: [id number] Date of Birth: 1946/06/15 Referring Provider (PT): Crist Infante   Encounter Date: 04/21/2020   PT End of Session - 04/21/20 1536    Visit Number 5    Number of Visits 9    Date for PT Re-Evaluation 78/67/67   90 day cert, 6 week poc due to scheduling/vacation   Authorization Type [employer] medicare so 10th visit progress note. FOTO? (power went out)    PT Start Time 1402    PT Stop Time 1443    PT Time Calculation (min) 41 min    Equipment Utilized During Treatment Gait belt    Behavior During Therapy WFL for tasks assessed/performed           Past Medical History:  Diagnosis Date  . Anemia   . Anxiety   . Arthritis   . Asthma   . Bicornuate uterus    double cervix  . CAD (coronary artery disease)   . Colon polyp   . Depression   . Deviated septum   . DJD (degenerative joint disease)    L-Spine  . Fibroids   . GERD (gastroesophageal reflux disease)   . Hematuria   . Hiatal hernia   . HNP (herniated nucleus pulposus)    lower back  . Hyperlipidemia   . Hypertension   . MVP (mitral valve prolapse)    pt denies, pt states she had a Echo ?2008 and it did not show MVP  . Pre-diabetes   . Retinal tear 2005   Small right retinal tear  . Shingles 1980's   on her waist    Past Surgical History:  Procedure Laterality Date  . ANTERIOR CERVICAL DECOMP/DISCECTOMY FUSION N/A 09/05/2017   Procedure: Cervical four-five, Cervical five-six, Cervical six-seven Anterior cervical decompression/discectomy/fusion;  Surgeon: Erline Levine, MD;  Location: [facility];  Service: Neurosurgery;  Laterality: N/A;  . bone spur surgery Bilateral    x 3  . CARDIOVASCULAR STRESS TEST  02/08/2014  . COLONOSCOPY    . CYST EXCISION Left 09/29/2018   Procedure: LEFT  MIDDLE FINGER CYST REMOVAL AND DEBRIDEMENT OF DISTAL INTERPHALANGEAL JOINT;  Surgeon: Daryll Brod, MD;  Location: [facility];  Service: Orthopedics;  Laterality: Left;  . FINGER SURGERY Left    left third finger mucoid cyst removed  . FOOT NEUROMA SURGERY Bilateral   . HYSTEROSCOPY     D&C PMB Endo Cx Polyp  . KNEE ARTHROSCOPY WITH MENISCAL REPAIR Right 2016   --Flossie Dibble, MD  . LAPAROSCOPIC HYSTERECTOMY  7/209/10   R-TLH/BSO  Fibroids, BAck Pain, Adenomyosis  . LUMBAR FUSION    . removal vaginal septum    . TOTAL KNEE ARTHROPLASTY Right 05/17/2019   Procedure: TOTAL KNEE ARTHROPLASTY;  Surgeon: Gaynelle Arabian, MD;  Location: [facility];  Service: Orthopedics;  Laterality: Right;  65mn  . TUBAL LIGATION      There were no vitals filed for this visit.   Subjective Assessment - 04/21/20 1404    Subjective Saw the cardiologist - stopped taking the statins and started repatha shots yesterday. Feeling a little fatigued today - had to drive her [facility]and did a lot of walking to and from the parking lot. Bought ankle weights for her exercises at home, has not had the chance to  try them yet.    Pertinent History PMH: obesity, HTN, hyperlipidemia. Right TKR 04/2019 and history of cervical and lumbar fusions.    Patient Stated Goals Pt wants to stop feeling like legs are rubbery and strengthen left hand.    Currently in Pain? Yes    Pain Score 2     Pain Location Shoulder   between shoulder blades   Pain Descriptors / Indicators Aching    Pain Type Chronic pain    Pain Onset More than a month ago    Pain Onset More than a month ago                             [facility] Adult PT Treatment/Exercise - 04/21/20 1417      Transfers   Transfers Sit to Stand;Stand to Sit    Sit to Stand 5: Supervision    Stand to Sit 5: Supervision    Comments x10 reps standing on blue foam, focus on eccentric control       Exercises   Exercises  Knee/Hip;Other Exercises    Other Exercises  with red theraband in door hinge: x10 reps scapular retraction, verbally added to HEP      Knee/Hip Exercises: Aerobic   Nustep level 4 for 6 minutes with BLE and BUE for strengthening and activity tolerance, HR at 105 bpm and O2 sats and 97%      Knee/Hip Exercises: Standing   Lateral Step Up Left;1 set;10 reps;Hand Hold: 0;Step Height: 6"    Lateral Step Up Limitations with single fingertip support, floating non-stance leg for SLS    Forward Step Up Left;1 set;10 reps;Hand Hold: 0;Step Height: 6";Hand Hold: 1    Forward Step Up Limitations intermittent UE support, cues to float non-stance leg for SLS, min guard intermittently for balance               Balance Exercises - 04/21/20 0001      Balance Exercises: Standing   Wall Bumps Hip;Eyes opened;10 reps;Limitations    Wall Bumps Limitations standing on rockerboard, cues for technique and hip strategy x10 reps    Rockerboard Anterior/posterior;EO;Limitations    Rockerboard Limitations x15 reps forward weight shift onto toes and back onto heels, pt with incr difficulty shifting weight anteriorly    Other Standing Exercises standing on rockerboard in A/P direction: x5 reps scapular retraction, x10 reps scapular retraction with red theraband (therapist holding for resistance)             PT Education - 04/21/20 1536    Education Details verbally added standing scapular retraction with red theraband in door hinge to HEP.    Person(s) Educated Patient    Methods Explanation;Demonstration    Comprehension Verbalized understanding;Returned demonstration            PT Short Term Goals - 04/17/20 1419      PT SHORT TERM GOAL #1   Title Pt will be independent with initial HEP for strengthening and balance.    Baseline pt reports not performing HEP at home.    Time 3    Period Weeks    Status Not Met    Target Date 04/12/20      PT SHORT TERM GOAL #2   Title Pt will decrease 5 x  sit to stand from 12.44 sec to 10 sec for improved balance and functional strength.    Baseline 12.44 sec from chair without arms on 03/22/20, 12.28  seconds with no UE support from standard chair on 04/17/20    Time 3    Period Weeks    Status Not Met    Target Date 04/12/20             PT Long Term Goals - 03/22/20 2028      PT LONG TERM GOAL #1   Title Pt will be independent with progressive HEP to be able to transfer to gym program and continue gains on own.    Time 6    Period Weeks    Status New    Target Date 05/03/20      PT LONG TERM GOAL #2   Title Pt will increase FGA from 19/30 to >24/30 for improved balance and decreased fall risk.    Baseline 19/30 on 03/22/20    Time 6    Period Weeks    Status New    Target Date 05/03/20      PT LONG TERM GOAL #3   Title Pt will ambulate >800' independently on varied surfaces for improved community mobility.    Time 6    Period Weeks    Status New    Target Date 05/03/20                 Plan - 04/21/20 1632    Clinical Impression Statement Focus of today's skilled session was BLE strengthening (focus on L), balance strategies, and postural strengthening. Pt tolerated session much better today - HR incr at most to 105 bpm after activity and O2 sats remained at 97% and above throughout session. Pt with decr fatigue today and needed less seated rest breaks. Will continue to progress towards LTGs.    Personal Factors and Comorbidities Comorbidity 3+    Comorbidities obesity, HTN, hyperlipidemia.    Examination-Activity Limitations Locomotion Level;Stairs    Examination-Participation Restrictions Community Activity    Stability/Clinical Decision Making Evolving/Moderate complexity    Rehab Potential Good    PT Frequency 2x / week   plus 1x/week for 4 weeks   PT Duration 2 weeks    PT Treatment/Interventions ADLs/Self Care Home Management;Moist Heat;Cryotherapy;Gait training;Stair training;Functional mobility  training;Therapeutic activities;Neuromuscular re-education;Balance training;Therapeutic exercise;Patient/family education;Vestibular;Manual techniques;Passive range of motion    PT Next Visit Plan how to cardiologist go? Continue to add to strengthening and balance HEP. Add some postural exercises as well, including scapular retraction. Focus on left hip and hamstring strength. Treadmill or Sci-Fit for aerobic activity that could transition to the gym.    Consulted and Agree with Plan of Care Patient           Patient will benefit from skilled therapeutic intervention in order to improve the following deficits and impairments:  Decreased activity tolerance, Abnormal gait, Decreased balance, Decreased endurance  Visit Diagnosis: Muscle weakness (generalized)  Other abnormalities of gait and mobility     Problem List Patient Active Problem List   Diagnosis Date Noted  . Left-sided weakness 03/16/2020  . CVA (cerebral vascular accident) ([place]) 03/15/2020  . OA (osteoarthritis) of knee 05/17/2019  . Herniated cervical disc without myelopathy 09/05/2017  . Bronchitis 04/16/2017  . Microcytosis 11/12/2013  . Screening for depression 11/12/2013  . Obesity 10/29/2012  . Underimmunization status 06/02/2012  . Herpes zona 03/01/2012  . LBP (low back pain) 02/27/2012  . Atypical chest pain 10/31/2011  . Overweight(278.02) 10/31/2011  . HLD (hyperlipidemia)   . Hypertension   . Fatigue 10/30/2011  . Laryngeal pain 09/05/2011  . Benign neoplasm of colon  08/14/2011  . Acute bronchitis 07/08/2011  . Allergic rhinitis, seasonal 11/30/2009  . Airway hyperreactivity 11/30/2009  . DDD (degenerative disc disease) 11/30/2009  . Clinical depression 11/30/2009  . Benign essential HTN 11/30/2009  . Acid reflux 11/30/2009  . Other and unspecified general psychiatric examination 11/30/2009  . Heart murmur, systolic 20/06/711  . Elevated fasting blood sugar 11/30/2009    Arliss Journey,  PT, DPT  04/21/2020, 4:33 PM  [facility] [address] [place], [place], [address] Phone: [phone]   Fax:  [phone]  Name: Shelley Spencer MRN: [id number] Date of Birth: [birth year]

## 2020-04-22 MED ORDER — REPATHA SURECLICK 140 MG/ML ~~LOC~~ SOAJ: 1.0000 "pen " | SUBCUTANEOUS | 3 refills | Status: DC

## 2020-04-26 ENCOUNTER — Other Ambulatory Visit: Payer: Self-pay

## 2020-04-26 ENCOUNTER — Ambulatory Visit: Payer: Medicare Other

## 2020-04-26 ENCOUNTER — Ambulatory Visit: Payer: Medicare Other | Attending: Internal Medicine

## 2020-04-26 DIAGNOSIS — R2689 Other abnormalities of gait and mobility: Secondary | ICD-10-CM

## 2020-04-26 DIAGNOSIS — M6281 Muscle weakness (generalized): Secondary | ICD-10-CM | POA: Diagnosis present

## 2020-04-26 NOTE — Therapy (Signed)
[facility] [address] [place], [place], [address] Phone: [phone]   Fax:  [phone]  Physical Therapy Treatment  Patient Details  Name: Shelley Spencer MRN: [id number] Date of Birth: 20-Nov-1945 Referring Provider (PT): Crist Infante   Encounter Date: 04/26/2020   PT End of Session - 04/26/20 1543    Visit Number 6    Number of Visits 9    Date for PT Re-Evaluation 83/15/17   90 day cert, 6 week poc due to scheduling/vacation   Authorization Type [employer] medicare so 10th visit progress note. FOTO? (power went out)    PT Start Time 1538   pt arrived late   PT Stop Time 1613    PT Time Calculation (min) 35 min    Equipment Utilized During Treatment Gait belt    Behavior During Therapy WFL for tasks assessed/performed           Past Medical History:  Diagnosis Date  . Anemia   . Anxiety   . Arthritis   . Asthma   . Bicornuate uterus    double cervix  . CAD (coronary artery disease)   . Colon polyp   . Depression   . Deviated septum   . DJD (degenerative joint disease)    L-Spine  . Fibroids   . GERD (gastroesophageal reflux disease)   . Hematuria   . Hiatal hernia   . HNP (herniated nucleus pulposus)    lower back  . Hyperlipidemia   . Hypertension   . MVP (mitral valve prolapse)    pt denies, pt states she had a Echo ?2008 and it did not show MVP  . Pre-diabetes   . Retinal tear 2005   Small right retinal tear  . Shingles 1980's   on her waist    Past Surgical History:  Procedure Laterality Date  . ANTERIOR CERVICAL DECOMP/DISCECTOMY FUSION N/A 09/05/2017   Procedure: Cervical four-five, Cervical five-six, Cervical six-seven Anterior cervical decompression/discectomy/fusion;  Surgeon: Erline Levine, MD;  Location: [facility];  Service: Neurosurgery;  Laterality: N/A;  . bone spur surgery Bilateral    x 3  . CARDIOVASCULAR STRESS TEST  02/08/2014  . COLONOSCOPY    . CYST EXCISION Left 09/29/2018    Procedure: LEFT MIDDLE FINGER CYST REMOVAL AND DEBRIDEMENT OF DISTAL INTERPHALANGEAL JOINT;  Surgeon: Daryll Brod, MD;  Location: [facility];  Service: Orthopedics;  Laterality: Left;  . FINGER SURGERY Left    left third finger mucoid cyst removed  . FOOT NEUROMA SURGERY Bilateral   . HYSTEROSCOPY     D&C PMB Endo Cx Polyp  . KNEE ARTHROSCOPY WITH MENISCAL REPAIR Right 2016   --Flossie Dibble, MD  . LAPAROSCOPIC HYSTERECTOMY  7/209/10   R-TLH/BSO  Fibroids, BAck Pain, Adenomyosis  . LUMBAR FUSION    . removal vaginal septum    . TOTAL KNEE ARTHROPLASTY Right 05/17/2019   Procedure: TOTAL KNEE ARTHROPLASTY;  Surgeon: Gaynelle Arabian, MD;  Location: [facility];  Service: Orthopedics;  Laterality: Right;  44mn  . TUBAL LIGATION      There were no vitals filed for this visit.   Subjective Assessment - 04/26/20 1541    Subjective Pt's statins were all stopped. She is taking ascepa and will be on praluant every 2 weeks. Feeling much better all ready.    Pertinent History PMH: obesity, HTN, hyperlipidemia. Right TKR 04/2019 and history of cervical and lumbar fusions.    Patient Stated Goals Pt wants to stop feeling  like legs are rubbery and strengthen left hand.    Currently in Pain? Yes    Pain Score 2     Pain Location Shoulder   between shoulder blades   Pain Orientation Right;Left    Pain Descriptors / Indicators Aching    Pain Type Chronic pain    Pain Onset More than a month ago    Pain Onset More than a month ago                             [facility] Adult PT Treatment/Exercise - 04/26/20 1545      Ambulation/Gait   Ambulation/Gait Yes    Ambulation/Gait Assistance 7: Independent    Ambulation/Gait Assistance Details around in clinic during session    Assistive device None    Gait Pattern Step-through pattern    Ambulation Surface Level;Indoor      Neuro Re-ed    Neuro Re-ed Details  Standing in corner on pillows: feet together x 30 sec eyes closed,  head turns up/down x 10 and left/right x 10, marching in place x 10 without UE support. Pt was cued to move slow and controlled. Supervision with activities. Pt had increased sway with eyes closed.  Wall bumps x 10 with verbal cues to keep body stiff and use ankles to pull forward.       Exercises   Exercises Other Exercises    Other Exercises  Wall push ups 10 x 2 with verbal cues for form to relax upper traps.      Knee/Hip Exercises: Aerobic   Nustep x 8 min level 5 with arms and legs. Pt instructed to keep steps per minute >50. HR=104 after.                   PT Education - 04/26/20 2045    Education Details Added to HEP    Person(s) Educated Patient    Methods Explanation;Demonstration;Handout    Comprehension Verbalized understanding            PT Short Term Goals - 04/17/20 1419      PT SHORT TERM GOAL #1   Title Pt will be independent with initial HEP for strengthening and balance.    Baseline pt reports not performing HEP at home.    Time 3    Period Weeks    Status Not Met    Target Date 04/12/20      PT SHORT TERM GOAL #2   Title Pt will decrease 5 x sit to stand from 12.44 sec to 10 sec for improved balance and functional strength.    Baseline 12.44 sec from chair without arms on 03/22/20, 12.28 seconds with no UE support from standard chair on 04/17/20    Time 3    Period Weeks    Status Not Met    Target Date 04/12/20             PT Long Term Goals - 03/22/20 2028      PT LONG TERM GOAL #1   Title Pt will be independent with progressive HEP to be able to transfer to gym program and continue gains on own.    Time 6    Period Weeks    Status New    Target Date 05/03/20      PT LONG TERM GOAL #2   Title Pt will increase FGA from 19/30 to >24/30 for improved balance and decreased fall risk.  Baseline 19/30 on 03/22/20    Time 6    Period Weeks    Status New    Target Date 05/03/20      PT LONG TERM GOAL #3   Title Pt will ambulate >800'  independently on varied surfaces for improved community mobility.    Time 6    Period Weeks    Status New    Target Date 05/03/20                 Plan - 04/26/20 2046    Clinical Impression Statement Pt feeling much better since stoping statins. PT focused on adding to HEP with balance exercises today. Pt did well with additions. Was most challenged with eyes closed.    Personal Factors and Comorbidities Comorbidity 3+    Comorbidities obesity, HTN, hyperlipidemia.    Examination-Activity Limitations Locomotion Level;Stairs    Examination-Participation Restrictions Community Activity    Stability/Clinical Decision Making Evolving/Moderate complexity    Rehab Potential Good    PT Frequency 2x / week   plus 1x/week for 4 weeks   PT Duration 2 weeks    PT Treatment/Interventions ADLs/Self Care Home Management;Moist Heat;Cryotherapy;Gait training;Stair training;Functional mobility training;Therapeutic activities;Neuromuscular re-education;Balance training;Therapeutic exercise;Patient/family education;Vestibular;Manual techniques;Passive range of motion    PT Next Visit Plan Start to check goals to see how progress is coming as pt reports she is feeling much better. Starts back to work next week. Focus on left hip and hamstring strength. Treadmill or Sci-Fit for aerobic activity that could transition to the gym.    Consulted and Agree with Plan of Care Patient           Patient will benefit from skilled therapeutic intervention in order to improve the following deficits and impairments:  Decreased activity tolerance, Abnormal gait, Decreased balance, Decreased endurance  Visit Diagnosis: Other abnormalities of gait and mobility  Muscle weakness (generalized)     Problem List Patient Active Problem List   Diagnosis Date Noted  . Left-sided weakness 03/16/2020  . CVA (cerebral vascular accident) ([facility]) 03/15/2020  . OA (osteoarthritis) of knee 05/17/2019  . Herniated cervical  disc without myelopathy 09/05/2017  . Bronchitis 04/16/2017  . Microcytosis 11/12/2013  . Screening for depression 11/12/2013  . Obesity 10/29/2012  . Underimmunization status 06/02/2012  . Herpes zona 03/01/2012  . LBP (low back pain) 02/27/2012  . Atypical chest pain 10/31/2011  . Overweight(278.02) 10/31/2011  . HLD (hyperlipidemia)   . Hypertension   . Fatigue 10/30/2011  . Laryngeal pain 09/05/2011  . Benign neoplasm of colon 08/14/2011  . Acute bronchitis 07/08/2011  . Allergic rhinitis, seasonal 11/30/2009  . Airway hyperreactivity 11/30/2009  . DDD (degenerative disc disease) 11/30/2009  . Clinical depression 11/30/2009  . Benign essential HTN 11/30/2009  . Acid reflux 11/30/2009  . Other and unspecified general psychiatric examination 11/30/2009  . Heart murmur, systolic 49/82/6415  . Elevated fasting blood sugar 11/30/2009    Electa Sniff, PT, DPT, NCS 04/26/2020, 8:48 PM  [facility] [address] [place], [place], [address] Phone: [phone]   Fax:  [phone]  Name: Shelley Spencer MRN: [id number] Date of Birth: 1946/02/25

## 2020-04-26 NOTE — Patient Instructions (Signed)
Access Code: [email or web address] URL: [email or web address] Date: 04/26/2020 Prepared by: Cherly Anderson  Exercises Standing Knee Flexion AROM with Chair Support - 1 x daily - 5 x weekly - 2 sets - 10 reps Standing Hip Extension with Counter Support - 1 x daily - 5 x weekly - 2 sets - 10 reps Side Stepping with Counter Support - 1 x daily - 5 x weekly - 2 sets - 10 reps Romberg Stance Eyes Closed on Foam Pad - 2 x daily - 5 x weekly - 1 sets - 3 reps - 30 sec hold Standing Marching - 1 x daily - 5 x weekly - 2 sets - 10 reps Wall bumps with ankles - 1 x daily - 5 x weekly - 2 sets - 10 reps Scapular Retraction with Resistance - 1 x daily - 5 x weekly - 2 sets - 10 reps Wall Push Up - 1 x daily - 5 x weekly - 2 sets - 10 reps

## 2020-05-01 DIAGNOSIS — I2510 Atherosclerotic heart disease of native coronary artery without angina pectoris: Secondary | ICD-10-CM

## 2020-05-01 DIAGNOSIS — E7800 Pure hypercholesterolemia, unspecified: Secondary | ICD-10-CM

## 2020-05-01 DIAGNOSIS — Z789 Other specified health status: Secondary | ICD-10-CM

## 2020-05-01 MED ORDER — PRALUENT 150 MG/ML ~~LOC~~ SOAJ: 1.0000 | SUBCUTANEOUS | 3 refills | Status: DC

## 2020-05-01 NOTE — Telephone Encounter (Signed)
ICD-10-CM   1. Atherosclerosis of native coronary artery of native heart without angina pectoris  I25.10 Alirocumab (PRALUENT) 150 MG/ML SOAJ  2. Pure hypercholesterolemia  E78.00 Alirocumab (PRALUENT) 150 MG/ML SOAJ  3. Statin intolerance  Z78.9 Alirocumab (PRALUENT) 150 MG/ML SOAJ    Meds ordered this encounter  Medications  . Alirocumab (PRALUENT) 150 MG/ML SOAJ    Sig: Inject 1 Syringe into the skin every 14 (fourteen) days.    Dispense:  6 mL    Refill:  3    6 injections with 3 refills   Medications Discontinued During This Encounter  Medication Reason  . Evolocumab (REPATHA SURECLICK) 961 MG/ML SOAJ Not covered by the pt's insurance     Adrian Prows, MD, [facility] 05/01/2020, 10:17 PM Office: [phone]

## 2020-05-03 ENCOUNTER — Other Ambulatory Visit: Payer: Self-pay

## 2020-05-03 ENCOUNTER — Ambulatory Visit: Payer: Medicare Other

## 2020-05-03 VITALS — HR 80

## 2020-05-03 DIAGNOSIS — R2689 Other abnormalities of gait and mobility: Secondary | ICD-10-CM

## 2020-05-03 DIAGNOSIS — M6281 Muscle weakness (generalized): Secondary | ICD-10-CM

## 2020-05-03 NOTE — Therapy (Signed)
[facility] [address] [place], [place], [address] Phone: [phone]   Fax:  [phone]  Physical Therapy Treatment/ Discharge Summary  Patient Details  Name: Shelley Spencer MRN: [id number] Date of Birth: [birth year] Referring Provider (PT): Crist Infante  PHYSICAL THERAPY DISCHARGE SUMMARY  Visits from Start of Care: 7  Current functional level related to goals / functional outcomes: See clinical impression and goals. Pt is independent with all mobility and has returned to work.   Remaining deficits: none   Education / Equipment: HEP  Plan: Patient agrees to discharge.  Patient goals were met. Patient is being discharged due to meeting the stated rehab goals.  ?????       Encounter Date: 05/03/2020   PT End of Session - 05/03/20 1622    Visit Number 7    Number of Visits 9    Date for PT Re-Evaluation 85/63/14   90 day cert, 6 week poc due to scheduling/vacation   Authorization Type [employer] medicare so 10th visit progress note. FOTO? (power went out)    PT Start Time 1616    PT Stop Time 1651   discharge visit so finished early   PT Time Calculation (min) 35 min    Equipment Utilized During Treatment Gait belt    Behavior During Therapy WFL for tasks assessed/performed           Past Medical History:  Diagnosis Date  . Anemia   . Anxiety   . Arthritis   . Asthma   . Bicornuate uterus    double cervix  . CAD (coronary artery disease)   . Colon polyp   . Depression   . Deviated septum   . DJD (degenerative joint disease)    L-Spine  . Fibroids   . GERD (gastroesophageal reflux disease)   . Hematuria   . Hiatal hernia   . HNP (herniated nucleus pulposus)    lower back  . Hyperlipidemia   . Hypertension   . MVP (mitral valve prolapse)    pt denies, pt states she had a Echo ?2008 and it did not show MVP  . Pre-diabetes   . Retinal tear 2005   Small right retinal tear  . Shingles 1980's   on  her waist    Past Surgical History:  Procedure Laterality Date  . ANTERIOR CERVICAL DECOMP/DISCECTOMY FUSION N/A 09/05/2017   Procedure: Cervical four-five, Cervical five-six, Cervical six-seven Anterior cervical decompression/discectomy/fusion;  Surgeon: Erline Levine, MD;  Location: [facility];  Service: Neurosurgery;  Laterality: N/A;  . bone spur surgery Bilateral    x 3  . CARDIOVASCULAR STRESS TEST  02/08/2014  . COLONOSCOPY    . CYST EXCISION Left 09/29/2018   Procedure: LEFT MIDDLE FINGER CYST REMOVAL AND DEBRIDEMENT OF DISTAL INTERPHALANGEAL JOINT;  Surgeon: Daryll Brod, MD;  Location: [facility];  Service: Orthopedics;  Laterality: Left;  . FINGER SURGERY Left    left third finger mucoid cyst removed  . FOOT NEUROMA SURGERY Bilateral   . HYSTEROSCOPY     D&[facility] Endo Cx Polyp  . KNEE ARTHROSCOPY WITH MENISCAL REPAIR Right 2016   --Flossie Dibble, MD  . LAPAROSCOPIC HYSTERECTOMY  7/209/10   R-TLH/BSO  Fibroids, BAck Pain, Adenomyosis  . LUMBAR FUSION    . removal vaginal septum    . TOTAL KNEE ARTHROPLASTY Right 05/17/2019   Procedure: TOTAL KNEE ARTHROPLASTY;  Surgeon: Gaynelle Arabian, MD;  Location: [facility];  Service: Orthopedics;  Laterality: Right;  67mn  . TUBAL LIGATION      Vitals:   05/03/20 1625  Pulse: 80  SpO2: 96%     Subjective Assessment - 05/03/20 1620    Subjective Pt reports that last week she developed head and chest congestion. Was around her little grandson. Did a Covid home test and was negative. She is feeling a little more SOB. Does have some yellow discharge first thing in the morning. She is going to call her doctor to get checked since still hanging in there. Went back to work yesterday. Kids are not back yet.    Pertinent History PMH: obesity, HTN, hyperlipidemia. Right TKR 04/2019 and history of cervical and lumbar fusions.    Patient Stated Goals Pt wants to stop feeling like legs are rubbery and strengthen left hand.    Currently in Pain?  No/denies    Pain Onset More than a month ago    Pain Onset More than a month ago              [facility] - [facility]      Functional Gait  Assessment   Gait assessed  Yes    Gait Level Surface Walks 20 ft in less than 5.5 sec, no assistive devices, good speed, no evidence for imbalance, normal gait pattern, deviates no more than 6 in outside of the 12 in walkway width.    Change in Gait Speed Able to smoothly change walking speed without loss of balance or gait deviation. Deviate no more than 6 in outside of the 12 in walkway width.    Gait with Horizontal Head Turns Performs head turns smoothly with no change in gait. Deviates no more than 6 in outside 12 in walkway width    Gait with Vertical Head Turns Performs task with slight change in gait velocity (eg, minor disruption to smooth gait path), deviates 6 - 10 in outside 12 in walkway width or uses assistive device    Gait and Pivot Turn Pivot turns safely within 3 sec and stops quickly with no loss of balance.    Step Over Obstacle Is able to step over 2 stacked shoe boxes taped together (9 in total height) without changing gait speed. No evidence of imbalance.    Gait with Narrow Base of Support Ambulates 7-9 steps.    Gait with Eyes Closed Walks 20 ft, no assistive devices, good speed, no evidence of imbalance, normal gait pattern, deviates no more than 6 in outside 12 in walkway width. Ambulates 20 ft in less than 7 sec.    Ambulating Backwards Walks 20 ft, no assistive devices, good speed, no evidence for imbalance, normal gait    Steps Alternating feet, must use rail.    Total Score 27                         [facility] Adult PT Treatment/Exercise - 05/03/20 1634      Transfers   Transfers Sit to Stand;Stand to Sit    Sit to Stand 7: Independent    Five time sit to stand comments  10 sec from chair without hands    Stand to Sit 7: Independent      Ambulation/Gait   Ambulation/Gait Yes     Ambulation/Gait Assistance 7: Independent    Ambulation/Gait Assistance Details Pt ambulated over obstacles inside: stepping on/off 4" step, on/off airex and over blue mat x 8 during course of walk. HR=96, O2 sat=93% after.  Pt has slight antalgic gait on right due to chronic knee issues. She reports hips a little sore at end.    Ambulation Distance (Feet) 920 Feet    Assistive device None    Gait Pattern Step-through pattern;Antalgic    Ambulation Surface Level;Indoor                  PT Education - 05/03/20 2046    Education Details Discussed discharge today as planned.    Person(s) Educated Patient    Methods Explanation    Comprehension Verbalized understanding            PT Short Term Goals - 05/03/20 1627      PT SHORT TERM GOAL #1   Title Pt will be independent with initial HEP for strengthening and balance.    Baseline Pt reports she has been doing her exercises more.    Time 3    Period Weeks    Status Achieved    Target Date 04/12/20      PT SHORT TERM GOAL #2   Title Pt will decrease 5 x sit to stand from 12.44 sec to 10 sec for improved balance and functional strength.    Baseline 12.44 sec from chair without arms on 03/22/20, 12.28 seconds with no UE support from standard chair on 04/17/20, 05/03/20 10 sec    Time 3    Period Weeks    Status Achieved    Target Date 04/12/20             PT Long Term Goals - 05/03/20 1629      PT LONG TERM GOAL #1   Title Pt will be independent with progressive HEP to be able to transfer to gym program and continue gains on own.    Baseline Pt denies any questions on exercises. She reports that are going well.    Time 6    Period Weeks    Status Achieved      PT LONG TERM GOAL #2   Title Pt will increase FGA from 19/30 to >24/30 for improved balance and decreased fall risk.    Baseline 19/30 on 03/22/20, 05/03/20 FGA=27/30    Time 6    Period Weeks    Status Achieved      PT LONG TERM GOAL #3   Title Pt will  ambulate >800' independently on varied surfaces for improved community mobility.    Baseline 890' independently    Time 6    Period Weeks    Status Achieved                 Plan - 05/03/20 2048    Clinical Impression Statement Pt has met all goals. She is ambulating independently on varied surfaces. Low fall risk based on FGA of 27/30. Pt denies any issues with HEP. PT discharging today as planned.    Personal Factors and Comorbidities Comorbidity 3+    Comorbidities obesity, HTN, hyperlipidemia.    Examination-Activity Limitations Locomotion Level;Stairs    Examination-Participation Restrictions Community Activity    Stability/Clinical Decision Making Evolving/Moderate complexity    Rehab Potential Good    PT Frequency 2x / week   plus 1x/week for 4 weeks   PT Duration 2 weeks    PT Treatment/Interventions ADLs/Self Care Home Management;Moist Heat;Cryotherapy;Gait training;Stair training;Functional mobility training;Therapeutic activities;Neuromuscular re-education;Balance training;Therapeutic exercise;Patient/family education;Vestibular;Manual techniques;Passive range of motion    PT Next Visit Plan Discharge today    Consulted and Agree with Plan of Care Patient  Patient will benefit from skilled therapeutic intervention in order to improve the following deficits and impairments:  Decreased activity tolerance, Abnormal gait, Decreased balance, Decreased endurance  Visit Diagnosis: Other abnormalities of gait and mobility  Muscle weakness (generalized)     Problem List Patient Active Problem List   Diagnosis Date Noted  . Left-sided weakness 03/16/2020  . CVA (cerebral vascular accident) ([facility]) 03/15/2020  . OA (osteoarthritis) of knee 05/17/2019  . Herniated cervical disc without myelopathy 09/05/2017  . Bronchitis 04/16/2017  . Microcytosis 11/12/2013  . Screening for depression 11/12/2013  . Obesity 10/29/2012  . Underimmunization status 06/02/2012    . Herpes zona 03/01/2012  . LBP (low back pain) 02/27/2012  . Atypical chest pain 10/31/2011  . Overweight(278.02) 10/31/2011  . HLD (hyperlipidemia)   . Hypertension   . Fatigue 10/30/2011  . Laryngeal pain 09/05/2011  . Benign neoplasm of colon 08/14/2011  . Acute bronchitis 07/08/2011  . Allergic rhinitis, seasonal 11/30/2009  . Airway hyperreactivity 11/30/2009  . DDD (degenerative disc disease) 11/30/2009  . Clinical depression 11/30/2009  . Benign essential HTN 11/30/2009  . Acid reflux 11/30/2009  . Other and unspecified general psychiatric examination 11/30/2009  . Heart murmur, systolic 47/65/4650  . Elevated fasting blood sugar 11/30/2009    Electa Sniff, PT, DPT, NCS 05/03/2020, 8:49 PM  [facility] [address] [place], [place], [address] Phone: [phone]   Fax:  [phone]  Name: Shelley Spencer MRN: [id number] Date of Birth: April 26, 1946

## 2020-05-04 ENCOUNTER — Other Ambulatory Visit: Payer: Self-pay

## 2020-05-04 DIAGNOSIS — E7800 Pure hypercholesterolemia, unspecified: Secondary | ICD-10-CM

## 2020-05-04 DIAGNOSIS — Z789 Other specified health status: Secondary | ICD-10-CM

## 2020-05-04 DIAGNOSIS — I2510 Atherosclerotic heart disease of native coronary artery without angina pectoris: Secondary | ICD-10-CM

## 2020-05-04 MED ORDER — PRALUENT 150 MG/ML ~~LOC~~ SOAJ: 1.0000 | SUBCUTANEOUS | 3 refills | Status: DC

## 2020-05-05 ENCOUNTER — Telehealth: Payer: Self-pay

## 2020-05-09 ENCOUNTER — Telehealth: Payer: Self-pay

## 2020-05-09 ENCOUNTER — Other Ambulatory Visit: Payer: Self-pay

## 2020-05-09 NOTE — Telephone Encounter (Signed)
Was informed that patient was trying to contact staff in regards to Praluent medication. Patient stated that the medication was sent to the wrong pharmacy per Anabell Ferrel Logan, PCV manager. I attempted to call patient back x2. No response, but left two voicemails. Will look out for call back from patient.

## 2020-05-10 ENCOUNTER — Ambulatory Visit: Payer: Medicare Other

## 2020-05-10 ENCOUNTER — Other Ambulatory Visit: Payer: Self-pay

## 2020-05-10 DIAGNOSIS — E7800 Pure hypercholesterolemia, unspecified: Secondary | ICD-10-CM

## 2020-05-10 DIAGNOSIS — Z789 Other specified health status: Secondary | ICD-10-CM

## 2020-05-10 DIAGNOSIS — I2510 Atherosclerotic heart disease of native coronary artery without angina pectoris: Secondary | ICD-10-CM

## 2020-05-11 ENCOUNTER — Telehealth: Payer: Self-pay

## 2020-05-11 NOTE — Telephone Encounter (Signed)
Pt called asking about he PA on Praluent. Explain to pt that I already did a PA and it got approve, but the insurance mention that they could not

## 2020-05-11 NOTE — Telephone Encounter (Signed)
2 attempt to called pt about her medication no answer, left a vm

## 2020-05-17 ENCOUNTER — Ambulatory Visit: Payer: Medicare Other

## 2020-05-19 ENCOUNTER — Telehealth: Payer: Self-pay

## 2020-05-19 NOTE — Telephone Encounter (Signed)
AEX 02/2019, next scheduled 07/2020 with [place] [employer], off ERT, on gabapentin  Last screening MMG at [facility] per pt was 1 month ago, no records per [employer] with pt. Pt states having left breast pain that is intermittent near arm pit x 1.5-2 weeks. Denies fever, redness, skin or size changes in breast or lump. Pt states area is tender and when pain comes, rates as 9 on pain scale. Pt states is not taking OTC meds due to breast pain coming and going.  Denies vaginal bleeding or cramps at this time.  Pt states had Covid vaccine series in Feb and has cholesterol injections every week in leg.  Pt advised to have OV for further evaluation. Pt agreeable. Declines offered earlier appts with another provider due to Dr Quincy Simmonds not being in office today. Pt offered work-in appt with Dr Quincy Simmonds on Monday, declines. Pt states has appt with PCP on 8/31 morning. Pt states will keep PCP appt and return call if needed. Advised will give update to Dr Quincy Simmonds. Pt agreeable.   Routing to Dr Quincy Simmonds for review.  Encounter closed.

## 2020-05-19 NOTE — Telephone Encounter (Signed)
Patient is calling in regards to pain in right breast.

## 2020-05-23 ENCOUNTER — Ambulatory Visit: Payer: Medicare Other | Attending: Critical Care Medicine

## 2020-05-23 DIAGNOSIS — Z23 Encounter for immunization: Secondary | ICD-10-CM

## 2020-05-23 NOTE — Progress Notes (Signed)
   Covid-19 Vaccination Clinic  Name:  Shelley Spencer    MRN: [id number] DOB: [birth year]  05/23/2020  [name] was observed post Covid-19 immunization for 15 minutes without incident. She was provided with Vaccine Information Sheet and instruction to access the V-Safe system.   [name] was instructed to call 911 with any severe reactions post vaccine: Marland Kitchen Difficulty breathing  . Swelling of face and throat  . A fast heartbeat  . A bad rash all over body  . Dizziness and weakness

## 2020-05-24 ENCOUNTER — Other Ambulatory Visit: Payer: Self-pay | Admitting: Family Medicine

## 2020-05-24 DIAGNOSIS — N644 Mastodynia: Secondary | ICD-10-CM

## 2020-06-02 ENCOUNTER — Ambulatory Visit: Payer: Medicare Other | Admitting: Cardiology

## 2020-06-06 ENCOUNTER — Telehealth: Payer: Self-pay | Admitting: Gastroenterology

## 2020-06-06 NOTE — Telephone Encounter (Signed)
Pt was informed by her provider Dr. Joylene Draft is wanting for the pt to have a colonoscopy done now, pt is not due until 2023

## 2020-06-06 NOTE — Telephone Encounter (Signed)
Left a message with Dr Silvestre Mesi registered medical assistant. Asked she call me in regards to the request and Dr Perini's findings or concerns.

## 2020-06-07 NOTE — Telephone Encounter (Signed)
Dr Joylene Draft has notified the patient that he agrees 2023 is an appropriate date for the recall colonoscopy.

## 2020-06-09 ENCOUNTER — Other Ambulatory Visit: Payer: Medicare Other

## 2020-06-13 ENCOUNTER — Encounter (HOSPITAL_COMMUNITY): Payer: Self-pay | Admitting: *Deleted

## 2020-06-13 NOTE — Progress Notes (Signed)
Received referral from Dr. Joylene Draft for this pt to participate in pulmonary rehab with the the diagnosis of Asthma. Clinical review of pt follow up appt on 9/13 Primary office note.  Pt with Covid Risk Score - 6. Pt appropriate for scheduling for Pulmonary rehab.  Will forward to support staff for scheduling and verification of insurance eligibility/benefits with pt consent. Cherre Huger, BSN Cardiac and Training and development officer

## 2020-06-27 ENCOUNTER — Other Ambulatory Visit: Payer: Self-pay | Admitting: Internal Medicine

## 2020-06-27 DIAGNOSIS — M8580 Other specified disorders of bone density and structure, unspecified site: Secondary | ICD-10-CM

## 2020-07-05 ENCOUNTER — Ambulatory Visit
Admission: RE | Admit: 2020-07-05 | Discharge: 2020-07-05 | Disposition: A | Discharge status: Home / Self Care | Payer: Medicare Other | Source: Ambulatory Visit | Attending: Internal Medicine | Admitting: Internal Medicine

## 2020-07-05 ENCOUNTER — Other Ambulatory Visit: Payer: Self-pay

## 2020-07-05 DIAGNOSIS — M8580 Other specified disorders of bone density and structure, unspecified site: Secondary | ICD-10-CM

## 2020-07-10 ENCOUNTER — Encounter (HOSPITAL_COMMUNITY)
Admission: RE | Admit: 2020-07-10 | Discharge: 2020-07-10 | Disposition: A | Discharge status: Home / Self Care | Payer: Medicare Other | Source: Ambulatory Visit | Attending: Cardiology | Admitting: Cardiology

## 2020-07-10 ENCOUNTER — Other Ambulatory Visit: Payer: Self-pay

## 2020-07-10 ENCOUNTER — Encounter (HOSPITAL_COMMUNITY): Payer: Self-pay

## 2020-07-10 VITALS — BP 110/60 | HR 70 | Resp 20 | Ht 62.0 in | Wt 185.2 lb

## 2020-07-10 DIAGNOSIS — J45909 Unspecified asthma, uncomplicated: Secondary | ICD-10-CM | POA: Diagnosis not present

## 2020-07-10 NOTE — Progress Notes (Signed)
Pulmonary Individual Treatment Plan  Patient Details  Name: JENNEFER KOPP MRN: [id number] Date of Birth: [birth year] Referring Provider:    Initial Encounter Date:   Visit Diagnosis: Uncomplicated asthma, unspecified asthma severity, unspecified whether persistent  Patient's Home Medications on Admission:   Current Outpatient Medications:  .  acetaminophen (TYLENOL) 650 MG CR tablet, Take 1,300 mg by mouth every 8 (eight) hours as needed for pain. , Disp: , Rfl:  .  albuterol (VENTOLIN HFA) 108 (90 Base) MCG/ACT inhaler, Inhale 1-2 puffs into the lungs every 6 (six) hours as needed for wheezing or shortness of breath. , Disp: , Rfl:  .  Alirocumab (PRALUENT) 150 MG/ML SOAJ, Inject 1 Syringe into the skin every 14 (fourteen) days., Disp: 6 mL, Rfl: 3 .  aspirin EC 81 MG EC tablet, Take 1 tablet (81 mg total) by mouth daily. Swallow whole., Disp: 30 tablet, Rfl: 11 .  budesonide-formoterol (SYMBICORT) 160-4.5 MCG/ACT inhaler, Inhale 2 puffs into the lungs daily. , Disp: , Rfl:  .  buPROPion (WELLBUTRIN XL) 150 MG 24 hr tablet, Take 150 mg by mouth daily., Disp: , Rfl:  .  CALCIUM-VITAMIN D PO, Take 1 tablet by mouth daily., Disp: , Rfl:  .  cetirizine (ZYRTEC) 10 MG tablet, Take 10 mg by mouth daily., Disp: , Rfl:  .  doxazosin (CARDURA) 4 MG tablet, Take 4 mg by mouth daily. , Disp: , Rfl:  .  furosemide (LASIX) 40 MG tablet, Take 40 mg by mouth daily., Disp: , Rfl:  .  Investigational - Study Medication, Take 1 tablet by mouth daily. Study name: [place] stroke study Additional study details: [id number] bottle: 5, 15, 25 mg or placebo, Disp: 1 each, Rfl: PRN .  Investigational - Study Medication, Take 1 tablet by mouth daily. Study name: [place] stroke study Additional study details: [id number]/green bottle: 5, 15, 25 mg or placebo tablet (PI - Sethi), Disp: 1 each, Rfl: PRN .  Multiple Vitamins-Minerals (MULTIVITAMIN PO), Take 1 tablet by mouth daily., Disp: , Rfl:  .  olmesartan  (BENICAR) 40 MG tablet, Take 40 mg by mouth every evening., Disp: , Rfl:  .  pantoprazole (PROTONIX) 40 MG tablet, Take 1 tablet (40 mg total) by mouth 2 (two) times daily. (Patient taking differently: Take 40 mg by mouth every evening. ), Disp: 180 tablet, Rfl: 3 .  PARoxetine (PAXIL) 40 MG tablet, Take 40 mg by mouth every evening. , Disp: , Rfl:  .  potassium chloride SA (K-DUR) 20 MEQ tablet, Take 20 mEq by mouth daily. , Disp: , Rfl:  .  traZODone (DESYREL) 150 MG tablet, Take 150 mg by mouth at bedtime., Disp: , Rfl:  .  vitamin B-12 (CYANOCOBALAMIN) 1000 MCG tablet, Take 1,000 mcg by mouth daily., Disp: , Rfl:  .  ALPRAZolam (XANAX) 0.25 MG tablet, Take 0.25 mg by mouth 3 (three) times daily as needed for anxiety or sleep.  (Patient not taking: Reported on 07/10/2020), Disp: , Rfl:  .  docusate sodium (COLACE) 100 MG capsule, Take 1 capsule (100 mg total) by mouth 2 (two) times daily. (Patient not taking: Reported on 07/10/2020), Disp: 60 capsule, Rfl: 0 .  ezetimibe (ZETIA) 10 MG tablet, Take 1 tablet (10 mg total) by mouth daily after supper. (Patient not taking: Reported on 04/26/2020), Disp: 90 tablet, Rfl: 3 .  icosapent Ethyl (VASCEPA) 1 g capsule, Take 2 capsules (2 g total) by mouth 2 (two) times daily. (Patient not taking: Reported on 07/10/2020), Disp: 120 capsule, Rfl:  6 .  nitroGLYCERIN (NITROSTAT) 0.4 MG SL tablet, Place 1 tablet (0.4 mg total) under the tongue every 5 (five) minutes as needed for chest pain. (Patient not taking: Reported on 04/19/2020), Disp: 25 tablet, Rfl: 3 .  simvastatin (ZOCOR) 20 MG tablet, Take 1 tablet (20 mg total) by mouth as directed. 1 tablet Every other day (Patient not taking: Reported on 04/26/2020), Disp: 90 tablet, Rfl: 0  Past Medical History: Past Medical History:  Diagnosis Date  . Anemia   . Anxiety   . Arthritis   . Asthma   . Bicornuate uterus    double cervix  . CAD (coronary artery disease)   . Colon polyp   . Depression   . Deviated  septum   . DJD (degenerative joint disease)    L-Spine  . Fibroids   . GERD (gastroesophageal reflux disease)   . Hematuria   . Hiatal hernia   . HNP (herniated nucleus pulposus)    lower back  . Hyperlipidemia   . Hypertension   . MVP (mitral valve prolapse)    pt denies, pt states she had a Echo ?2008 and it did not show MVP  . Pre-diabetes   . Retinal tear 2005   Small right retinal tear  . Shingles 1980's   on her waist    Tobacco Use: Social History   Tobacco Use  Smoking Status Former Smoker  . Packs/day: 1.00  . Years: 20.00  . Pack years: 20.00  . Types: Cigarettes  . Quit date: 09/24/1979  . Years since quitting: 40.8  Smokeless Tobacco Never Used    Labs: Recent Chemical engineer    Labs for ITP Cardiac and Pulmonary Rehab Latest Ref Rng & Units 08/24/2008 09/02/2017 12/28/2019 03/15/2020   Cholestrol 0 - 200 mg/dL - - 211(H) 160   LDLCALC 0 - 99 mg/dL - - 95 77   HDL >40 mg/dL - - 69 63   Trlycerides <150 mg/dL - - 282(H) 101   Hemoglobin A1c 4.8 - 5.6 % - 5.7(H) - 6.1(H)   TCO2 0 - 100 mmol/L 27 - - -      Capillary Blood Glucose: Lab Results  Component Value Date   GLUCAP 96 03/16/2020   GLUCAP 83 03/16/2020   GLUCAP 95 03/16/2020   GLUCAP 146 (H) 03/15/2020   GLUCAP 86 03/15/2020     Pulmonary Assessment Scores:  Pulmonary Assessment Scores    Row Name 07/10/20 1630         ADL UCSD   ADL Phase Entry     SOB Score total 46       CAT Score   CAT Score 17       mMRC Score   mMRC Score 3           UCSD: Self-administered rating of dyspnea associated with activities of daily living (ADLs) 6-point scale (0 = "not at all" to 5 = "maximal or unable to do because of breathlessness")  Scoring Scores range from 0 to 120.  Minimally important difference is 5 units  CAT: CAT can identify the health impairment of COPD patients and is better correlated with disease progression.  CAT has a scoring range of zero to 40. The CAT score is  classified into four groups of low (less than 10), medium (10 - 20), high (21-30) and very high (31-40) based on the impact level of disease on health status. A CAT score over 10 suggests significant symptoms.  A worsening CAT  score could be explained by an exacerbation, poor medication adherence, poor inhaler technique, or progression of COPD or comorbid conditions.  CAT MCID is 2 points  mMRC: mMRC (Modified Medical Research Council) Dyspnea Scale is used to assess the degree of baseline functional disability in patients of respiratory disease due to dyspnea. No minimal important difference is established. A decrease in score of 1 point or greater is considered a positive change.   Pulmonary Function Assessment:   Exercise Target Goals: Exercise Program Goal: Individual exercise prescription set using results from initial 6 min walk test and THRR while considering  patient's activity barriers and safety.   Exercise Prescription Goal: Initial exercise prescription builds to 30-45 minutes a day of aerobic activity, 2-3 days per week.  Home exercise guidelines will be given to patient during program as part of exercise prescription that the participant will acknowledge.  Activity Barriers & Risk Stratification:  Activity Barriers & Cardiac Risk Stratification - 07/10/20 1343      Activity Barriers & Cardiac Risk Stratification   Activity Barriers Arthritis;Back Problems;Balance Concerns;History of Falls;Shortness of Breath;Joint Problems;Deconditioning           6 Minute Walk:  6 Minute Walk    Row Name 07/10/20 1622         6 Minute Walk   Phase Initial     Distance 1188 feet     Walk Time 6 minutes     # of Rest Breaks 1  stopped at 5 minutes and 40 seconds due to hip pain scale 5/10     MPH 2.25     METS 2.22     RPE 13     Perceived Dyspnea  2     VO2 Peak 7.76     Symptoms Yes (comment)     Comments Hip pain 5/10     Resting HR 84 bpm     Resting BP 110/56      Resting Oxygen Saturation  94 %     Exercise Oxygen Saturation  during 6 min walk 82 %     Max Ex. HR 113 bpm     Max Ex. BP 120/68     2 Minute Post BP 110/60       Interval HR   1 Minute HR 95     2 Minute HR 92     3 Minute HR 111     4 Minute HR 111     5 Minute HR 113     6 Minute HR 102     2 Minute Post HR 93     Interval Heart Rate? Yes       Interval Oxygen   Interval Oxygen? Yes     Baseline Oxygen Saturation % 94 %     1 Minute Oxygen Saturation % 86 %     1 Minute Liters of Oxygen 0 L     2 Minute Oxygen Saturation % 82 %     2 Minute Liters of Oxygen 0 L     3 Minute Oxygen Saturation % 97 %     3 Minute Liters of Oxygen 0 L     4 Minute Oxygen Saturation % 97 %     4 Minute Liters of Oxygen 0 L     5 Minute Oxygen Saturation % 97 %     5 Minute Liters of Oxygen 0 L     6 Minute Oxygen Saturation % 98 %     6  Minute Liters of Oxygen 0 L     2 Minute Post Oxygen Saturation % 99 %     2 Minute Post Liters of Oxygen 0 L            Oxygen Initial Assessment:  Oxygen Initial Assessment - 07/10/20 1620      Home Oxygen   Home Oxygen Device None    Sleep Oxygen Prescription None    Home Exercise Oxygen Prescription None    Home Resting Oxygen Prescription None      Initial 6 min Walk   Oxygen Used None      Program Oxygen Prescription   Program Oxygen Prescription None      Intervention   Short Term Goals To learn and exhibit compliance with exercise, home and travel O2 prescription;To learn and understand importance of maintaining oxygen saturations>88%;To learn and demonstrate proper use of respiratory medications;To learn and understand importance of monitoring SPO2 with pulse oximeter and demonstrate accurate use of the pulse oximeter.;To learn and demonstrate proper pursed lip breathing techniques or other breathing techniques.    Long  Term Goals Exhibits compliance with exercise, home and travel O2 prescription;Verbalizes importance of monitoring  SPO2 with pulse oximeter and return demonstration;Maintenance of O2 saturations>88%;Exhibits proper breathing techniques, such as pursed lip breathing or other method taught during program session;Compliance with respiratory medication;Demonstrates proper use of MDI's           Oxygen Re-Evaluation:   Oxygen Discharge (Final Oxygen Re-Evaluation):   Initial Exercise Prescription:   Perform Capillary Blood Glucose checks as needed.  Exercise Prescription Changes:   Exercise Comments:   Exercise Goals and Review:  Exercise Goals    Row Name 07/10/20 1344 07/10/20 1621           Exercise Goals   Increase Physical Activity Yes Yes      Intervention Provide advice, education, support and counseling about physical activity/exercise needs.;Develop an individualized exercise prescription for aerobic and resistive training based on initial evaluation findings, risk stratification, comorbidities and participant's personal goals. Provide advice, education, support and counseling about physical activity/exercise needs.;Develop an individualized exercise prescription for aerobic and resistive training based on initial evaluation findings, risk stratification, comorbidities and participant's personal goals.      Expected Outcomes Short Term: Attend rehab on a regular basis to increase amount of physical activity.;Long Term: Exercising regularly at least 3-5 days a week.;Long Term: Add in home exercise to make exercise part of routine and to increase amount of physical activity. Short Term: Attend rehab on a regular basis to increase amount of physical activity.;Long Term: Exercising regularly at least 3-5 days a week.;Long Term: Add in home exercise to make exercise part of routine and to increase amount of physical activity.      Increase Strength and Stamina Yes Yes      Intervention Provide advice, education, support and counseling about physical activity/exercise needs.;Develop an  individualized exercise prescription for aerobic and resistive training based on initial evaluation findings, risk stratification, comorbidities and participant's personal goals. Provide advice, education, support and counseling about physical activity/exercise needs.;Develop an individualized exercise prescription for aerobic and resistive training based on initial evaluation findings, risk stratification, comorbidities and participant's personal goals.      Expected Outcomes Short Term: Increase workloads from initial exercise prescription for resistance, speed, and METs.;Short Term: Perform resistance training exercises routinely during rehab and add in resistance training at home;Long Term: Improve cardiorespiratory fitness, muscular endurance and strength as measured by increased METs and  functional capacity (6MWT) Short Term: Increase workloads from initial exercise prescription for resistance, speed, and METs.;Short Term: Perform resistance training exercises routinely during rehab and add in resistance training at home;Long Term: Improve cardiorespiratory fitness, muscular endurance and strength as measured by increased METs and functional capacity (6MWT)      Able to understand and use rate of perceived exertion (RPE) scale Yes Yes      Intervention Provide education and explanation on how to use RPE scale Provide education and explanation on how to use RPE scale      Expected Outcomes Short Term: Able to use RPE daily in rehab to express subjective intensity level;Long Term:  Able to use RPE to guide intensity level when exercising independently Short Term: Able to use RPE daily in rehab to express subjective intensity level;Long Term:  Able to use RPE to guide intensity level when exercising independently      Able to understand and use Dyspnea scale Yes Yes      Intervention Provide education and explanation on how to use Dyspnea scale Provide education and explanation on how to use Dyspnea scale       Expected Outcomes Short Term: Able to use Dyspnea scale daily in rehab to express subjective sense of shortness of breath during exertion;Long Term: Able to use Dyspnea scale to guide intensity level when exercising independently Short Term: Able to use Dyspnea scale daily in rehab to express subjective sense of shortness of breath during exertion;Long Term: Able to use Dyspnea scale to guide intensity level when exercising independently      Knowledge and understanding of Target Heart Rate Range (THRR) Yes Yes      Intervention Provide education and explanation of THRR including how the numbers were predicted and where they are located for reference Provide education and explanation of THRR including how the numbers were predicted and where they are located for reference      Expected Outcomes Short Term: Able to state/look up THRR;Long Term: Able to use THRR to govern intensity when exercising independently;Short Term: Able to use daily as guideline for intensity in rehab Short Term: Able to state/look up THRR;Long Term: Able to use THRR to govern intensity when exercising independently;Short Term: Able to use daily as guideline for intensity in rehab      Understanding of Exercise Prescription Yes Yes      Intervention Provide education, explanation, and written materials on patient's individual exercise prescription Provide education, explanation, and written materials on patient's individual exercise prescription      Expected Outcomes Short Term: Able to explain program exercise prescription;Long Term: Able to explain home exercise prescription to exercise independently Short Term: Able to explain program exercise prescription;Long Term: Able to explain home exercise prescription to exercise independently             Exercise Goals Re-Evaluation :   Discharge Exercise Prescription (Final Exercise Prescription Changes):   Nutrition:  Target Goals: Understanding of nutrition guidelines,  daily intake of sodium 1500mg , cholesterol 200mg , calories 30% from fat and 7% or less from saturated fats, daily to have 5 or more servings of fruits and vegetables.  Biometrics:  Pre Biometrics - 07/10/20 1622      Pre Biometrics   Grip Strength 22 kg            Nutrition Therapy Plan and Nutrition Goals:   Nutrition Assessments:   Nutrition Goals Re-Evaluation:   Nutrition Goals Discharge (Final Nutrition Goals Re-Evaluation):   Psychosocial: Target Goals:  Acknowledge presence or absence of significant depression and/or stress, maximize coping skills, provide positive support system. Participant is able to verbalize types and ability to use techniques and skills needed for reducing stress and depression.  Initial Review & Psychosocial Screening:  Initial Psych Review & Screening - 07/10/20 1356      Initial Review   Current issues with History of Depression      Family Dynamics   Good Support System? Yes    Comments cares for husband who has memory and physical issues      Barriers   Psychosocial barriers to participate in program The patient should benefit from training in stress management and relaxation.      Screening Interventions   Interventions Encouraged to exercise           Quality of Life Scores:  Scores of 19 and below usually indicate a poorer quality of life in these areas.  A difference of  2-3 points is a clinically meaningful difference.  A difference of 2-3 points in the total score of the Quality of Life Index has been associated with significant improvement in overall quality of life, self-image, physical symptoms, and general health in studies assessing change in quality of life.  PHQ-9: Recent Review Flowsheet Data    Depression screen [facility] 2/9 07/10/2020   Decreased Interest 0   Down, Depressed, Hopeless 0   PHQ - 2 Score 0   Altered sleeping 0   Tired, decreased energy 1   Change in appetite 0   Feeling bad or failure about  yourself  0   Trouble concentrating 1   Moving slowly or fidgety/restless 0   Suicidal thoughts 0   PHQ-9 Score 2   Difficult doing work/chores Not difficult at all     Interpretation of Total Score  Total Score Depression Severity:  1-4 = Minimal depression, 5-9 = Mild depression, 10-14 = Moderate depression, 15-19 = Moderately severe depression, 20-27 = Severe depression   Psychosocial Evaluation and Intervention:  Psychosocial Evaluation - 07/10/20 1402      Psychosocial Evaluation & Interventions   Interventions Encouraged to exercise with the program and follow exercise prescription    Continue Psychosocial Services  Follow up required by staff           Psychosocial Re-Evaluation:   Psychosocial Discharge (Final Psychosocial Re-Evaluation):   Education: Education Goals: Education classes will be provided on a weekly basis, covering required topics. Participant will state understanding/return demonstration of topics presented.  Learning Barriers/Preferences:  Learning Barriers/Preferences - 07/10/20 1403      Learning Barriers/Preferences   Learning Barriers Sight    Learning Preferences Written Material;Group Instruction;Individual Instruction           Education Topics: Risk Factor Reduction:  -Group instruction that is supported by a PowerPoint presentation. Instructor discusses the definition of a risk factor, different risk factors for pulmonary disease, and how the heart and lungs work together.     Nutrition for Pulmonary Patient:  -Group instruction provided by PowerPoint slides, verbal discussion, and written materials to support subject matter. The instructor gives an explanation and review of healthy diet recommendations, which includes a discussion on weight management, recommendations for fruit and vegetable consumption, as well as protein, fluid, caffeine, fiber, sodium, sugar, and alcohol. Tips for eating when patients are short of breath are  discussed.   Pursed Lip Breathing:  -Group instruction that is supported by demonstration and informational handouts. Instructor discusses the benefits of pursed lip and  diaphragmatic breathing and detailed demonstration on how to preform both.     Oxygen Safety:  -Group instruction provided by PowerPoint, verbal discussion, and written material to support subject matter. There is an overview of "What is Oxygen" and "Why do we need it".  Instructor also reviews how to create a safe environment for oxygen use, the importance of using oxygen as prescribed, and the risks of noncompliance. There is a brief discussion on traveling with oxygen and resources the patient may utilize.   Oxygen Equipment:  -Group instruction provided by [facility] Staff utilizing handouts, written materials, and equipment demonstrations.   Signs and Symptoms:  -Group instruction provided by written material and verbal discussion to support subject matter. Warning signs and symptoms of infection, stroke, and heart attack are reviewed and when to call the physician/911 reinforced. Tips for preventing the spread of infection discussed.   Advanced Directives:  -Group instruction provided by verbal instruction and written material to support subject matter. Instructor reviews Advanced Directive laws and proper instruction for filling out document.   Pulmonary Video:  -Group video education that reviews the importance of medication and oxygen compliance, exercise, good nutrition, pulmonary hygiene, and pursed lip and diaphragmatic breathing for the pulmonary patient.   Exercise for the Pulmonary Patient:  -Group instruction that is supported by a PowerPoint presentation. Instructor discusses benefits of exercise, core components of exercise, frequency, duration, and intensity of an exercise routine, importance of utilizing pulse oximetry during exercise, safety while exercising, and options of places to exercise outside  of rehab.     Pulmonary Medications:  -Verbally interactive group education provided by instructor with focus on inhaled medications and proper administration.   Anatomy and Physiology of the Respiratory System and Intimacy:  -Group instruction provided by PowerPoint, verbal discussion, and written material to support subject matter. Instructor reviews respiratory cycle and anatomical components of the respiratory system and their functions. Instructor also reviews differences in obstructive and restrictive respiratory diseases with examples of each. Intimacy, Sex, and Sexuality differences are reviewed with a discussion on how relationships can change when diagnosed with pulmonary disease. Common sexual concerns are reviewed.   MD DAY -A group question and answer session with a medical doctor that allows participants to ask questions that relate to their pulmonary disease state.   OTHER EDUCATION -Group or individual verbal, written, or video instructions that support the educational goals of the pulmonary rehab program.   Holiday Eating Survival Tips:  -Group instruction provided by PowerPoint slides, verbal discussion, and written materials to support subject matter. The instructor gives patients tips, tricks, and techniques to help them not only survive but enjoy the holidays despite the onslaught of food that accompanies the holidays.   Knowledge Questionnaire Score:  Knowledge Questionnaire Score - 07/10/20 1630      Knowledge Questionnaire Score   Pre Score 17/18           Core Components/Risk Factors/Patient Goals at Admission:  Personal Goals and Risk Factors at Admission - 07/10/20 1408      Core Components/Risk Factors/Patient Goals on Admission   Goal Weight: Short Term 178 lb (80.7 kg)    Goal Weight: Long Term 160 lb (72.6 kg)    Expected Outcomes Short Term: Continue to assess and modify interventions until short term weight is achieved;Long Term: Adherence to  nutrition and physical activity/exercise program aimed toward attainment of established weight goal;Weight Loss: Understanding of general recommendations for a balanced deficit meal plan, which promotes 1-2 lb weight  loss per week and includes a negative energy balance of [phone] kcal/d;Understanding recommendations for meals to include 15-35% energy as protein, 25-35% energy from fat, 35-60% energy from carbohydrates, less than 200mg  of dietary cholesterol, 20-35 gm of total fiber daily;Understanding of distribution of calorie intake throughout the day with the consumption of 4-5 meals/snacks    Improve shortness of breath with ADL's Yes    Intervention Provide education, individualized exercise plan and daily activity instruction to help decrease symptoms of SOB with activities of daily living.    Expected Outcomes Short Term: Improve cardiorespiratory fitness to achieve a reduction of symptoms when performing ADLs;Long Term: Be able to perform more ADLs without symptoms or delay the onset of symptoms    Hypertension Yes    Intervention Provide education on lifestyle modifcations including regular physical activity/exercise, weight management, moderate sodium restriction and increased consumption of fresh fruit, vegetables, and low fat dairy, alcohol moderation, and smoking cessation.;Monitor prescription use compliance.    Expected Outcomes Short Term: Continued assessment and intervention until BP is < 140/63mm HG in hypertensive participants. < 130/53mm HG in hypertensive participants with diabetes, heart failure or chronic kidney disease.;Long Term: Maintenance of blood pressure at goal levels.    Lipids Yes    Intervention Provide education and support for participant on nutrition & aerobic/resistive exercise along with prescribed medications to achieve LDL 70mg , HDL >40mg .    Expected Outcomes Short Term: Participant states understanding of desired cholesterol values and is compliant with  medications prescribed. Participant is following exercise prescription and nutrition guidelines.;Long Term: Cholesterol controlled with medications as prescribed, with individualized exercise RX and with personalized nutrition plan. Value goals: LDL < 70mg , HDL > 40 mg.           Core Components/Risk Factors/Patient Goals Review:    Core Components/Risk Factors/Patient Goals at Discharge (Final Review):    ITP Comments:   Comments:

## 2020-07-10 NOTE — Progress Notes (Signed)
Shelley Spencer 74 y.o. female Pulmonary Rehab Orientation Note Shelley Spencer who was referred to Pulmonary rehab by her primary MD Dr. Joylene Draft with the diagnosis of Asthma arrived today in Cardiac and Pulmonary Rehab. She arrived ambulatory  with steady gait. Pt does have complaints of lower back pain and bilat hip discomfort that decreases with rest. She does not carry portable oxygen.  Per pt, she uses oxygen never. Color good, skin warm and dry. Patient is oriented to time and place. Patient's medical history, psychosocial health, and medications reviewed. Psychosocial assessment reveals pt lives with their spouse and her son who is recovering from alcoholism - 2 years sober. Pt is currently retired nut works casual schedule as Editor, commissioning at [employer]. Pt hobbies include reading. Pt reports her stress level is low. Areas of stress/anxiety include Family.  Pt cares for her husband who has memory and physical issues. Pt does not exhibit  signs of current depression but has history of depression and takes three medications and is under the care of a psychiatrist who she sees twice a year for a check in.  PHQ2/9 score 0/2. Pt shows good  coping skills with positive outlook .  Will continue to monitor and evaluate progress toward psychosocial goal(s) of feeling better with more energy and stamina.  Pt [place] upcoming [place] and would like to be able to ambulate around the park with less shortness of breath (plans to use a scooter) Physical assessment reveals heart rate is normal, breath sounds clear to auscultation, no wheezes, rales, or rhonchi but shallow breaths. Grip strength equal, strong. Distal pulses palpable with no swelling. Patient reports she does take medications as prescribed. Patient states she follows a Regular diet. The patient has been trying to lose weight through a healthy diet and exercise program. Shelley Spencer has lost about 50 pounds. Patient's weight will be monitored closely. Demonstration  and practice of PLB using pulse oximeter. Patient able to return demonstration satisfactorily. Safety and hand hygiene in the exercise area reviewed with patient. Patient voices understanding of the information reviewed. Department expectations discussed with patient and achievable goals were set. The patient shows enthusiasm about attending the program and we look forward to working with this nice lady. The patient completed  a 6 min walk test.  Will need to consider using the pulse ox probe sideways due to gel and nail polish  Pt will begin exercise on 10/26 at 2:00.  45 minutes was spent on a variety of activities such as assessment of the patient, obtaining baseline data including height, weight, BMI, and grip strength, verifying medical history, allergies, and current medications, and teaching patient strategies for performing tasks with less respiratory effort with emphasis on pursed lip breathing. Cherre Huger, BSN Cardiac and Training and development officer

## 2020-07-18 ENCOUNTER — Encounter (HOSPITAL_COMMUNITY)
Admission: RE | Admit: 2020-07-18 | Discharge: 2020-07-18 | Disposition: A | Discharge status: Home / Self Care | Payer: Medicare Other | Source: Ambulatory Visit | Attending: Cardiology | Admitting: Cardiology

## 2020-07-18 ENCOUNTER — Other Ambulatory Visit: Payer: Self-pay

## 2020-07-18 DIAGNOSIS — J45909 Unspecified asthma, uncomplicated: Secondary | ICD-10-CM | POA: Diagnosis not present

## 2020-07-18 NOTE — Progress Notes (Signed)
Daily Session Note  Patient Details  Name: Shelley Spencer MRN: [id number] Date of Birth: 12/22/1945 Referring Provider:     Pulmonary Rehab Walk Test from 07/10/2020 in [place]  Referring Provider Dr. Letta Median      Encounter Date: 07/18/2020  Check In:  Session Check In - 07/18/20 1430      Check-In   Supervising physician immediately available to respond to emergencies [facility] immediately available    Physician(s) Dr. Dessa Phi    Location [facility]    Staff Present Maurice Small, RN, BSN;Markeem Noreen Ysidro Evert, RN;[name] Hassell Done, MS, ACSM-CEP, Exercise Physiologist    Virtual Visit No    Medication changes reported     No    Fall or balance concerns reported    No    Tobacco Cessation No Change    Warm-up and Cool-down Performed on first and last piece of equipment    Resistance Training Performed Yes    VAD Patient? No    PAD/SET Patient? No      Pain Assessment   Currently in Pain? No/denies    Pain Score 0-No pain    Multiple Pain Sites No           Capillary Blood Glucose: No results found for this or any previous visit (from the past 24 hour(s)).    Social History   Tobacco Use  Smoking Status Former Smoker  . Packs/day: 1.00  . Years: 20.00  . Pack years: 20.00  . Types: Cigarettes  . Quit date: 09/24/1979  . Years since quitting: 40.8  Smokeless Tobacco Never Used    Goals Met:  Exercise tolerated well No report of cardiac concerns or symptoms Strength training completed today  Goals Unmet:  Not Applicable  Comments: Service time is from 1355 to 1505    Dr. Fransico Him is Medical Director for Cardiac Rehab at [facility].

## 2020-07-20 ENCOUNTER — Encounter (HOSPITAL_COMMUNITY)
Admission: RE | Admit: 2020-07-20 | Discharge: 2020-07-20 | Disposition: A | Discharge status: Home / Self Care | Payer: Medicare Other | Source: Ambulatory Visit | Attending: Cardiology | Admitting: Cardiology

## 2020-07-20 ENCOUNTER — Other Ambulatory Visit: Payer: Self-pay

## 2020-07-20 DIAGNOSIS — J45909 Unspecified asthma, uncomplicated: Secondary | ICD-10-CM

## 2020-07-20 NOTE — Progress Notes (Signed)
Daily Session Note  Patient Details  Name: Shelley Spencer MRN: [id number] Date of Birth: 04-Apr-1946 Referring Provider:     Pulmonary Rehab Walk Test from 07/10/2020 in [place]  Referring Provider Dr. Letta Median      Encounter Date: 07/20/2020  Check In:  Session Check In - 07/20/20 1421      Check-In   Supervising physician immediately available to respond to emergencies [facility] immediately available    Physician(s) Dr. Darrick Meigs    Location [facility]    Staff Present Rosebud Poles, RN, BSN;Lisa Ysidro Evert, RN;Toney Lizaola Hassell Done, MS, ACSM-CEP, Exercise Physiologist    Virtual Visit No    Medication changes reported     No    Fall or balance concerns reported    No    Tobacco Cessation No Change    Warm-up and Cool-down Performed on first and last piece of equipment    Resistance Training Performed Yes    VAD Patient? No    PAD/SET Patient? No      Pain Assessment   Currently in Pain? No/denies    Pain Score 0-No pain    Multiple Pain Sites No           Capillary Blood Glucose: No results found for this or any previous visit (from the past 24 hour(s)).    Social History   Tobacco Use  Smoking Status Former Smoker  . Packs/day: 1.00  . Years: 20.00  . Pack years: 20.00  . Types: Cigarettes  . Quit date: 09/24/1979  . Years since quitting: 40.8  Smokeless Tobacco Never Used    Goals Met:  Proper associated with RPD/PD & O2 Sat Exercise tolerated well No report of cardiac concerns or symptoms Strength training completed today  Goals Unmet:  Not Applicable  Comments: Service time is from 1405 to 1500    Dr. Fransico Him is Medical Director for Cardiac Rehab at [facility].

## 2020-07-20 NOTE — Progress Notes (Signed)
Pulmonary Individual Treatment Plan  Patient Details  Name: Shelley Spencer MRN: [id number] Date of Birth: 11-30-1945 Referring Provider:     Pulmonary Rehab Walk Test from 07/10/2020 in [place] [facility]  Referring Provider Dr. Letta Median      Initial Encounter Date:    Pulmonary Rehab Walk Test from 07/10/2020 in [place]  Date 07/10/20      Visit Diagnosis: Uncomplicated asthma, unspecified asthma severity, unspecified whether persistent  Patient's Home Medications on Admission:   Current Outpatient Medications:  .  acetaminophen (TYLENOL) 650 MG CR tablet, Take 1,300 mg by mouth every 8 (eight) hours as needed for pain. , Disp: , Rfl:  .  albuterol (VENTOLIN HFA) 108 (90 Base) MCG/ACT inhaler, Inhale 1-2 puffs into the lungs every 6 (six) hours as needed for wheezing or shortness of breath. , Disp: , Rfl:  .  Alirocumab (PRALUENT) 150 MG/ML SOAJ, Inject 1 Syringe into the skin every 14 (fourteen) days., Disp: 6 mL, Rfl: 3 .  ALPRAZolam (XANAX) 0.25 MG tablet, Take 0.25 mg by mouth 3 (three) times daily as needed for anxiety or sleep.  (Patient not taking: Reported on 07/10/2020), Disp: , Rfl:  .  aspirin EC 81 MG EC tablet, Take 1 tablet (81 mg total) by mouth daily. Swallow whole., Disp: 30 tablet, Rfl: 11 .  budesonide-formoterol (SYMBICORT) 160-4.5 MCG/ACT inhaler, Inhale 2 puffs into the lungs daily. , Disp: , Rfl:  .  buPROPion (WELLBUTRIN XL) 150 MG 24 hr tablet, Take 150 mg by mouth daily., Disp: , Rfl:  .  CALCIUM-VITAMIN D PO, Take 1 tablet by mouth daily., Disp: , Rfl:  .  cetirizine (ZYRTEC) 10 MG tablet, Take 10 mg by mouth daily., Disp: , Rfl:  .  docusate sodium (COLACE) 100 MG capsule, Take 1 capsule (100 mg total) by mouth 2 (two) times daily. (Patient not taking: Reported on 07/10/2020), Disp: 60 capsule, Rfl: 0 .  doxazosin (CARDURA) 4 MG tablet, Take 4 mg by mouth daily. , Disp: , Rfl:  .  ezetimibe (ZETIA)  10 MG tablet, Take 1 tablet (10 mg total) by mouth daily after supper. (Patient not taking: Reported on 04/26/2020), Disp: 90 tablet, Rfl: 3 .  furosemide (LASIX) 40 MG tablet, Take 40 mg by mouth daily., Disp: , Rfl:  .  icosapent Ethyl (VASCEPA) 1 g capsule, Take 2 capsules (2 g total) by mouth 2 (two) times daily. (Patient not taking: Reported on 07/10/2020), Disp: 120 capsule, Rfl: 6 .  Investigational - Study Medication, Take 1 tablet by mouth daily. Study name: [place] stroke study Additional study details: [id number] bottle: 5, 15, 25 mg or placebo, Disp: 1 each, Rfl: PRN .  Investigational - Study Medication, Take 1 tablet by mouth daily. Study name: [place] stroke study Additional study details: [id number]/green bottle: 5, 15, 25 mg or placebo tablet (PI - Sethi), Disp: 1 each, Rfl: PRN .  Multiple Vitamins-Minerals (MULTIVITAMIN PO), Take 1 tablet by mouth daily., Disp: , Rfl:  .  nitroGLYCERIN (NITROSTAT) 0.4 MG SL tablet, Place 1 tablet (0.4 mg total) under the tongue every 5 (five) minutes as needed for chest pain. (Patient not taking: Reported on 04/19/2020), Disp: 25 tablet, Rfl: 3 .  olmesartan (BENICAR) 40 MG tablet, Take 40 mg by mouth every evening., Disp: , Rfl:  .  pantoprazole (PROTONIX) 40 MG tablet, Take 1 tablet (40 mg total) by mouth 2 (two) times daily. (Patient taking differently: Take 40 mg by  mouth every evening. ), Disp: 180 tablet, Rfl: 3 .  PARoxetine (PAXIL) 40 MG tablet, Take 40 mg by mouth every evening. , Disp: , Rfl:  .  potassium chloride SA (K-DUR) 20 MEQ tablet, Take 20 mEq by mouth daily. , Disp: , Rfl:  .  simvastatin (ZOCOR) 20 MG tablet, Take 1 tablet (20 mg total) by mouth as directed. 1 tablet Every other day (Patient not taking: Reported on 04/26/2020), Disp: 90 tablet, Rfl: 0 .  traZODone (DESYREL) 150 MG tablet, Take 150 mg by mouth at bedtime., Disp: , Rfl:  .  vitamin B-12 (CYANOCOBALAMIN) 1000 MCG tablet, Take 1,000 mcg by mouth daily., Disp: , Rfl:    Past Medical History: Past Medical History:  Diagnosis Date  . Anemia   . Anxiety   . Arthritis   . Asthma   . Bicornuate uterus    double cervix  . CAD (coronary artery disease)   . Colon polyp   . Depression   . Deviated septum   . DJD (degenerative joint disease)    L-Spine  . Fibroids   . GERD (gastroesophageal reflux disease)   . Hematuria   . Hiatal hernia   . HNP (herniated nucleus pulposus)    lower back  . Hyperlipidemia   . Hypertension   . MVP (mitral valve prolapse)    pt denies, pt states she had a Echo ?2008 and it did not show MVP  . Pre-diabetes   . Retinal tear 2005   Small right retinal tear  . Shingles 1980's   on her waist    Tobacco Use: Social History   Tobacco Use  Smoking Status Former Smoker  . Packs/day: 1.00  . Years: 20.00  . Pack years: 20.00  . Types: Cigarettes  . Quit date: 09/24/1979  . Years since quitting: 40.8  Smokeless Tobacco Never Used    Labs: Recent Chemical engineer    Labs for ITP Cardiac and Pulmonary Rehab Latest Ref Rng & Units 08/24/2008 09/02/2017 12/28/2019 03/15/2020   Cholestrol 0 - 200 mg/dL - - 211(H) 160   LDLCALC 0 - 99 mg/dL - - 95 77   HDL >40 mg/dL - - 69 63   Trlycerides <150 mg/dL - - 282(H) 101   Hemoglobin A1c 4.8 - 5.6 % - 5.7(H) - 6.1(H)   TCO2 0 - 100 mmol/L 27 - - -      Capillary Blood Glucose: Lab Results  Component Value Date   GLUCAP 96 03/16/2020   GLUCAP 83 03/16/2020   GLUCAP 95 03/16/2020   GLUCAP 146 (H) 03/15/2020   GLUCAP 86 03/15/2020     Pulmonary Assessment Scores:  Pulmonary Assessment Scores    Row Name 07/10/20 1630         ADL UCSD   ADL Phase Entry     SOB Score total 46       CAT Score   CAT Score 17       mMRC Score   mMRC Score 3           UCSD: Self-administered rating of dyspnea associated with activities of daily living (ADLs) 6-point scale (0 = "not at all" to 5 = "maximal or unable to do because of breathlessness")  Scoring Scores  range from 0 to 120.  Minimally important difference is 5 units  CAT: CAT can identify the health impairment of COPD patients and is better correlated with disease progression.  CAT has a scoring range of zero to  40. The CAT score is classified into four groups of low (less than 10), medium (10 - 20), high (21-30) and very high (31-40) based on the impact level of disease on health status. A CAT score over 10 suggests significant symptoms.  A worsening CAT score could be explained by an exacerbation, poor medication adherence, poor inhaler technique, or progression of COPD or comorbid conditions.  CAT MCID is 2 points  mMRC: mMRC (Modified Medical Research Council) Dyspnea Scale is used to assess the degree of baseline functional disability in patients of respiratory disease due to dyspnea. No minimal important difference is established. A decrease in score of 1 point or greater is considered a positive change.   Pulmonary Function Assessment:   Exercise Target Goals: Exercise Program Goal: Individual exercise prescription set using results from initial 6 min walk test and THRR while considering  patient's activity barriers and safety.   Exercise Prescription Goal: Initial exercise prescription builds to 30-45 minutes a day of aerobic activity, 2-3 days per week.  Home exercise guidelines will be given to patient during program as part of exercise prescription that the participant will acknowledge.  Activity Barriers & Risk Stratification:  Activity Barriers & Cardiac Risk Stratification - 07/10/20 1343      Activity Barriers & Cardiac Risk Stratification   Activity Barriers Arthritis;Back Problems;Balance Concerns;History of Falls;Shortness of Breath;Joint Problems;Deconditioning           6 Minute Walk:  6 Minute Walk    Row Name 07/10/20 1622         6 Minute Walk   Phase Initial     Distance 1188 feet     Walk Time 6 minutes     # of Rest Breaks 1  stopped at 5 minutes  and 40 seconds due to hip pain scale 5/10     MPH 2.25     METS 2.22     RPE 13     Perceived Dyspnea  2     VO2 Peak 7.76     Symptoms Yes (comment)     Comments Hip pain 5/10     Resting HR 84 bpm     Resting BP 110/56     Resting Oxygen Saturation  94 %     Exercise Oxygen Saturation  during 6 min walk 82 %     Max Ex. HR 113 bpm     Max Ex. BP 120/68     2 Minute Post BP 110/60       Interval HR   1 Minute HR 95     2 Minute HR 92     3 Minute HR 111     4 Minute HR 111     5 Minute HR 113     6 Minute HR 102     2 Minute Post HR 93     Interval Heart Rate? Yes       Interval Oxygen   Interval Oxygen? Yes     Baseline Oxygen Saturation % 94 %     1 Minute Oxygen Saturation % 86 %     1 Minute Liters of Oxygen 0 L     2 Minute Oxygen Saturation % 82 %     2 Minute Liters of Oxygen 0 L     3 Minute Oxygen Saturation % 97 %     3 Minute Liters of Oxygen 0 L     4 Minute Oxygen Saturation % 97 %  4 Minute Liters of Oxygen 0 L     5 Minute Oxygen Saturation % 97 %     5 Minute Liters of Oxygen 0 L     6 Minute Oxygen Saturation % 98 %     6 Minute Liters of Oxygen 0 L     2 Minute Post Oxygen Saturation % 99 %     2 Minute Post Liters of Oxygen 0 L            Oxygen Initial Assessment:  Oxygen Initial Assessment - 07/10/20 1620      Home Oxygen   Home Oxygen Device None    Sleep Oxygen Prescription None    Home Exercise Oxygen Prescription None    Home Resting Oxygen Prescription None      Initial 6 min Walk   Oxygen Used None      Program Oxygen Prescription   Program Oxygen Prescription None      Intervention   Short Term Goals To learn and exhibit compliance with exercise, home and travel O2 prescription;To learn and understand importance of maintaining oxygen saturations>88%;To learn and demonstrate proper use of respiratory medications;To learn and understand importance of monitoring SPO2 with pulse oximeter and demonstrate accurate use of the  pulse oximeter.;To learn and demonstrate proper pursed lip breathing techniques or other breathing techniques.    Long  Term Goals Exhibits compliance with exercise, home and travel O2 prescription;Verbalizes importance of monitoring SPO2 with pulse oximeter and return demonstration;Maintenance of O2 saturations>88%;Exhibits proper breathing techniques, such as pursed lip breathing or other method taught during program session;Compliance with respiratory medication;Demonstrates proper use of MDI's           Oxygen Re-Evaluation:  Oxygen Re-Evaluation    Row Name 07/20/20 0815             Program Oxygen Prescription   Program Oxygen Prescription None         Home Oxygen   Home Oxygen Device None       Sleep Oxygen Prescription None       Home Exercise Oxygen Prescription None       Home Resting Oxygen Prescription None         Goals/Expected Outcomes   Short Term Goals To learn and exhibit compliance with exercise, home and travel O2 prescription;To learn and understand importance of maintaining oxygen saturations>88%;To learn and demonstrate proper use of respiratory medications;To learn and understand importance of monitoring SPO2 with pulse oximeter and demonstrate accurate use of the pulse oximeter.;To learn and demonstrate proper pursed lip breathing techniques or other breathing techniques.       Long  Term Goals Exhibits compliance with exercise, home and travel O2 prescription;Verbalizes importance of monitoring SPO2 with pulse oximeter and return demonstration;Maintenance of O2 saturations>88%;Exhibits proper breathing techniques, such as pursed lip breathing or other method taught during program session;Compliance with respiratory medication;Demonstrates proper use of MDI's       Goals/Expected Outcomes compliance and understanding of oxygen saturation and pursed lip breathing              Oxygen Discharge (Final Oxygen Re-Evaluation):  Oxygen Re-Evaluation - 07/20/20  0815      Program Oxygen Prescription   Program Oxygen Prescription None      Home Oxygen   Home Oxygen Device None    Sleep Oxygen Prescription None    Home Exercise Oxygen Prescription None    Home Resting Oxygen Prescription None      Goals/Expected Outcomes  Short Term Goals To learn and exhibit compliance with exercise, home and travel O2 prescription;To learn and understand importance of maintaining oxygen saturations>88%;To learn and demonstrate proper use of respiratory medications;To learn and understand importance of monitoring SPO2 with pulse oximeter and demonstrate accurate use of the pulse oximeter.;To learn and demonstrate proper pursed lip breathing techniques or other breathing techniques.    Long  Term Goals Exhibits compliance with exercise, home and travel O2 prescription;Verbalizes importance of monitoring SPO2 with pulse oximeter and return demonstration;Maintenance of O2 saturations>88%;Exhibits proper breathing techniques, such as pursed lip breathing or other method taught during program session;Compliance with respiratory medication;Demonstrates proper use of MDI's    Goals/Expected Outcomes compliance and understanding of oxygen saturation and pursed lip breathing           Initial Exercise Prescription:  Initial Exercise Prescription - 07/13/20 0700      Date of Initial Exercise RX and Referring Provider   Date 07/10/20    Referring Provider Dr. Letta Median    Expected Discharge Date 09/14/20      Recumbant Bike   Level 1.5    Minutes 15      NuStep   Level 2    Minutes 15      Prescription Details   Frequency (times per week) 2    Duration Progress to 30 minutes of continuous aerobic without signs/symptoms of physical distress      Intensity   THRR 40-80% of Max Heartrate 59-118    Ratings of Perceived Exertion 11-13    Perceived Dyspnea 0-4      Progression   Progression Continue to progress workloads to maintain intensity without  signs/symptoms of physical distress.      Resistance Training   Training Prescription Yes    Weight orange bands    Reps 10-15           Perform Capillary Blood Glucose checks as needed.  Exercise Prescription Changes:  Exercise Prescription Changes    Row Name 07/20/20 0800             Response to Exercise   Blood Pressure (Admit) 140/72       Blood Pressure (Exercise) 140/72       Blood Pressure (Exit) 114/68       Heart Rate (Admit) 83 bpm       Heart Rate (Exercise) 107 bpm       Heart Rate (Exit) 96 bpm       Oxygen Saturation (Admit) 97 %       Oxygen Saturation (Exercise) 97 %       Oxygen Saturation (Exit) 96 %       Rating of Perceived Exertion (Exercise) 13       Perceived Dyspnea (Exercise) 1       Duration Continue with 30 min of aerobic exercise without signs/symptoms of physical distress.       Intensity Other (comment)  40%-80% HR Max         Progression   Progression Continue to progress workloads to maintain intensity without signs/symptoms of physical distress.       Average METs 2         Resistance Training   Training Prescription Yes       Weight orange bands       Reps 10-15       Time 10 Minutes         Recumbant Bike   Level 1.5       Minutes  15       METs 2.3         NuStep   Level 2       SPM 80       Minutes 15       METs 1.9              Exercise Comments:  Exercise Comments    Row Name 07/18/20 0810           Exercise Comments Pt completed first day of exercise and tolerated well with no complaints or concerns.              Exercise Goals and Review:  Exercise Goals    Row Name 07/10/20 1344 07/10/20 1621           Exercise Goals   Increase Physical Activity Yes Yes      Intervention Provide advice, education, support and counseling about physical activity/exercise needs.;Develop an individualized exercise prescription for aerobic and resistive training based on initial evaluation findings, risk  stratification, comorbidities and participant's personal goals. Provide advice, education, support and counseling about physical activity/exercise needs.;Develop an individualized exercise prescription for aerobic and resistive training based on initial evaluation findings, risk stratification, comorbidities and participant's personal goals.      Expected Outcomes Short Term: Attend rehab on a regular basis to increase amount of physical activity.;Long Term: Exercising regularly at least 3-5 days a week.;Long Term: Add in home exercise to make exercise part of routine and to increase amount of physical activity. Short Term: Attend rehab on a regular basis to increase amount of physical activity.;Long Term: Exercising regularly at least 3-5 days a week.;Long Term: Add in home exercise to make exercise part of routine and to increase amount of physical activity.      Increase Strength and Stamina Yes Yes      Intervention Provide advice, education, support and counseling about physical activity/exercise needs.;Develop an individualized exercise prescription for aerobic and resistive training based on initial evaluation findings, risk stratification, comorbidities and participant's personal goals. Provide advice, education, support and counseling about physical activity/exercise needs.;Develop an individualized exercise prescription for aerobic and resistive training based on initial evaluation findings, risk stratification, comorbidities and participant's personal goals.      Expected Outcomes Short Term: Increase workloads from initial exercise prescription for resistance, speed, and METs.;Short Term: Perform resistance training exercises routinely during rehab and add in resistance training at home;Long Term: Improve cardiorespiratory fitness, muscular endurance and strength as measured by increased METs and functional capacity (6MWT) Short Term: Increase workloads from initial exercise prescription for  resistance, speed, and METs.;Short Term: Perform resistance training exercises routinely during rehab and add in resistance training at home;Long Term: Improve cardiorespiratory fitness, muscular endurance and strength as measured by increased METs and functional capacity (6MWT)      Able to understand and use rate of perceived exertion (RPE) scale Yes Yes      Intervention Provide education and explanation on how to use RPE scale Provide education and explanation on how to use RPE scale      Expected Outcomes Short Term: Able to use RPE daily in rehab to express subjective intensity level;Long Term:  Able to use RPE to guide intensity level when exercising independently Short Term: Able to use RPE daily in rehab to express subjective intensity level;Long Term:  Able to use RPE to guide intensity level when exercising independently      Able to understand and use Dyspnea scale Yes Yes  Intervention Provide education and explanation on how to use Dyspnea scale Provide education and explanation on how to use Dyspnea scale      Expected Outcomes Short Term: Able to use Dyspnea scale daily in rehab to express subjective sense of shortness of breath during exertion;Long Term: Able to use Dyspnea scale to guide intensity level when exercising independently Short Term: Able to use Dyspnea scale daily in rehab to express subjective sense of shortness of breath during exertion;Long Term: Able to use Dyspnea scale to guide intensity level when exercising independently      Knowledge and understanding of Target Heart Rate Range (THRR) Yes Yes      Intervention Provide education and explanation of THRR including how the numbers were predicted and where they are located for reference Provide education and explanation of THRR including how the numbers were predicted and where they are located for reference      Expected Outcomes Short Term: Able to state/look up THRR;Long Term: Able to use THRR to govern intensity  when exercising independently;Short Term: Able to use daily as guideline for intensity in rehab Short Term: Able to state/look up THRR;Long Term: Able to use THRR to govern intensity when exercising independently;Short Term: Able to use daily as guideline for intensity in rehab      Understanding of Exercise Prescription Yes Yes      Intervention Provide education, explanation, and written materials on patient's individual exercise prescription Provide education, explanation, and written materials on patient's individual exercise prescription      Expected Outcomes Short Term: Able to explain program exercise prescription;Long Term: Able to explain home exercise prescription to exercise independently Short Term: Able to explain program exercise prescription;Long Term: Able to explain home exercise prescription to exercise independently             Exercise Goals Re-Evaluation :  Exercise Goals Re-Evaluation    Row Name 07/20/20 0814             Exercise Goal Re-Evaluation   Exercise Goals Review Increase Physical Activity;Increase Strength and Stamina;Able to understand and use rate of perceived exertion (RPE) scale;Able to understand and use Dyspnea scale;Knowledge and understanding of Target Heart Rate Range (THRR);Understanding of Exercise Prescription       Comments Pt has completed 1 exercise session and tolerated well. She is exercising at 1.9 METS on the Nustep and 2.3 METS on the bike. Will continue to monitor and progress as able.       Expected Outcomes Through exercise at rehab and home the patient will decrease shortness of breath with daily activities and feel confident in carrying out an exercise regimn at home.              Discharge Exercise Prescription (Final Exercise Prescription Changes):  Exercise Prescription Changes - 07/20/20 0800      Response to Exercise   Blood Pressure (Admit) 140/72    Blood Pressure (Exercise) 140/72    Blood Pressure (Exit) 114/68     Heart Rate (Admit) 83 bpm    Heart Rate (Exercise) 107 bpm    Heart Rate (Exit) 96 bpm    Oxygen Saturation (Admit) 97 %    Oxygen Saturation (Exercise) 97 %    Oxygen Saturation (Exit) 96 %    Rating of Perceived Exertion (Exercise) 13    Perceived Dyspnea (Exercise) 1    Duration Continue with 30 min of aerobic exercise without signs/symptoms of physical distress.    Intensity Other (comment)  40%-80% HR Max     Progression   Progression Continue to progress workloads to maintain intensity without signs/symptoms of physical distress.    Average METs 2      Resistance Training   Training Prescription Yes    Weight orange bands    Reps 10-15    Time 10 Minutes      Recumbant Bike   Level 1.5    Minutes 15    METs 2.3      NuStep   Level 2    SPM 80    Minutes 15    METs 1.9           Nutrition:  Target Goals: Understanding of nutrition guidelines, daily intake of sodium 1500mg , cholesterol 200mg , calories 30% from fat and 7% or less from saturated fats, daily to have 5 or more servings of fruits and vegetables.  Biometrics:  Pre Biometrics - 07/10/20 1622      Pre Biometrics   Grip Strength 22 kg            Nutrition Therapy Plan and Nutrition Goals:  Nutrition Therapy & Goals - 07/19/20 1344      Nutrition Therapy   RD appointment deferred Yes           Nutrition Assessments:  Nutrition Assessments - 07/19/20 1344      Rate Your Plate Scores   Pre Score 57           Nutrition Goals Re-Evaluation:   Nutrition Goals Discharge (Final Nutrition Goals Re-Evaluation):   Psychosocial: Target Goals: Acknowledge presence or absence of significant depression and/or stress, maximize coping skills, provide positive support system. Participant is able to verbalize types and ability to use techniques and skills needed for reducing stress and depression.  Initial Review & Psychosocial Screening:  Initial Psych Review & Screening - 07/10/20 1356       Initial Review   Current issues with History of Depression      Family Dynamics   Good Support System? Yes    Comments cares for husband who has memory and physical issues      Barriers   Psychosocial barriers to participate in program The patient should benefit from training in stress management and relaxation.      Screening Interventions   Interventions Encouraged to exercise           Quality of Life Scores:  Scores of 19 and below usually indicate a poorer quality of life in these areas.  A difference of  2-3 points is a clinically meaningful difference.  A difference of 2-3 points in the total score of the Quality of Life Index has been associated with significant improvement in overall quality of life, self-image, physical symptoms, and general health in studies assessing change in quality of life.  PHQ-9: Recent Review Flowsheet Data    Depression screen [facility] 2/9 07/10/2020   Decreased Interest 0   Down, Depressed, Hopeless 0   PHQ - 2 Score 0   Altered sleeping 0   Tired, decreased energy 1   Change in appetite 0   Feeling bad or failure about yourself  0   Trouble concentrating 1   Moving slowly or fidgety/restless 0   Suicidal thoughts 0   PHQ-9 Score 2   Difficult doing work/chores Not difficult at all     Interpretation of Total Score  Total Score Depression Severity:  1-4 = Minimal depression, 5-9 = Mild depression, 10-14 = Moderate depression, 15-19 =  Moderately severe depression, 20-27 = Severe depression   Psychosocial Evaluation and Intervention:  Psychosocial Evaluation - 07/19/20 1154      Psychosocial Evaluation & Interventions   Expected Outcomes Lekia will continue to have good managment of her depression both medically and counseling support           Psychosocial Re-Evaluation:  Psychosocial Re-Evaluation    Row Name 07/19/20 1153 07/19/20 1154           Psychosocial Re-Evaluation   Current issues with History of Depression --       Comments Aliviya has completed 1 exercise session and displays healthy positive outlook --      Expected Outcomes -- Enisa will continue to have good managment of her depression both medically and counseling support.  Jalan will be free of any psychosocial barriers.      Interventions -- Encouraged to attend Pulmonary Rehabilitation for the exercise      Continue Psychosocial Services  -- Follow up required by staff             Psychosocial Discharge (Final Psychosocial Re-Evaluation):  Psychosocial Re-Evaluation - 07/19/20 1154      Psychosocial Re-Evaluation   Expected Outcomes Shaniah will continue to have good managment of her depression both medically and counseling support.  Michelene will be free of any psychosocial barriers.    Interventions Encouraged to attend Pulmonary Rehabilitation for the exercise    Continue Psychosocial Services  Follow up required by staff           Education: Education Goals: Education classes will be provided on a weekly basis, covering required topics. Participant will state understanding/return demonstration of topics presented.  Learning Barriers/Preferences:  Learning Barriers/Preferences - 07/10/20 1403      Learning Barriers/Preferences   Learning Barriers Sight    Learning Preferences Written Material;Group Instruction;Individual Instruction           Education Topics: Risk Factor Reduction:  -Group instruction that is supported by a PowerPoint presentation. Instructor discusses the definition of a risk factor, different risk factors for pulmonary disease, and how the heart and lungs work together.     PULMONARY REHAB OTHER RESPIRATORY from 07/20/2020 in [facility]  Date 07/20/20  Educator Handout  Instruction Review Code 1- Verbalizes Understanding      Nutrition for Pulmonary Patient:  -Group instruction provided by PowerPoint slides, verbal discussion, and written materials to support subject  matter. The instructor gives an explanation and review of healthy diet recommendations, which includes a discussion on weight management, recommendations for fruit and vegetable consumption, as well as protein, fluid, caffeine, fiber, sodium, sugar, and alcohol. Tips for eating when patients are short of breath are discussed.   Pursed Lip Breathing:  -Group instruction that is supported by demonstration and informational handouts. Instructor discusses the benefits of pursed lip and diaphragmatic breathing and detailed demonstration on how to preform both.     Oxygen Safety:  -Group instruction provided by PowerPoint, verbal discussion, and written material to support subject matter. There is an overview of "What is Oxygen" and "Why do we need it".  Instructor also reviews how to create a safe environment for oxygen use, the importance of using oxygen as prescribed, and the risks of noncompliance. There is a brief discussion on traveling with oxygen and resources the patient may utilize.   Oxygen Equipment:  -Group instruction provided by [facility] Staff utilizing handouts, written materials, and equipment demonstrations.   Signs and Symptoms:  -  Group instruction provided by written material and verbal discussion to support subject matter. Warning signs and symptoms of infection, stroke, and heart attack are reviewed and when to call the physician/911 reinforced. Tips for preventing the spread of infection discussed.   Advanced Directives:  -Group instruction provided by verbal instruction and written material to support subject matter. Instructor reviews Advanced Directive laws and proper instruction for filling out document.   Pulmonary Video:  -Group video education that reviews the importance of medication and oxygen compliance, exercise, good nutrition, pulmonary hygiene, and pursed lip and diaphragmatic breathing for the pulmonary patient.   Exercise for the Pulmonary Patient:   -Group instruction that is supported by a PowerPoint presentation. Instructor discusses benefits of exercise, core components of exercise, frequency, duration, and intensity of an exercise routine, importance of utilizing pulse oximetry during exercise, safety while exercising, and options of places to exercise outside of rehab.     Pulmonary Medications:  -Verbally interactive group education provided by instructor with focus on inhaled medications and proper administration.   Anatomy and Physiology of the Respiratory System and Intimacy:  -Group instruction provided by PowerPoint, verbal discussion, and written material to support subject matter. Instructor reviews respiratory cycle and anatomical components of the respiratory system and their functions. Instructor also reviews differences in obstructive and restrictive respiratory diseases with examples of each. Intimacy, Sex, and Sexuality differences are reviewed with a discussion on how relationships can change when diagnosed with pulmonary disease. Common sexual concerns are reviewed.   MD DAY -A group question and answer session with a medical doctor that allows participants to ask questions that relate to their pulmonary disease state.   OTHER EDUCATION -Group or individual verbal, written, or video instructions that support the educational goals of the pulmonary rehab program.   Holiday Eating Survival Tips:  -Group instruction provided by PowerPoint slides, verbal discussion, and written materials to support subject matter. The instructor gives patients tips, tricks, and techniques to help them not only survive but enjoy the holidays despite the onslaught of food that accompanies the holidays.   Knowledge Questionnaire Score:  Knowledge Questionnaire Score - 07/10/20 1630      Knowledge Questionnaire Score   Pre Score 17/18           Core Components/Risk Factors/Patient Goals at Admission:  Personal Goals and Risk  Factors at Admission - 07/10/20 1408      Core Components/Risk Factors/Patient Goals on Admission   Goal Weight: Short Term 178 lb (80.7 kg)    Goal Weight: Long Term 160 lb (72.6 kg)    Expected Outcomes Short Term: Continue to assess and modify interventions until short term weight is achieved;Long Term: Adherence to nutrition and physical activity/exercise program aimed toward attainment of established weight goal;Weight Loss: Understanding of general recommendations for a balanced deficit meal plan, which promotes 1-2 lb weight loss per week and includes a negative energy balance of [id number] kcal/d;Understanding recommendations for meals to include 15-35% energy as protein, 25-35% energy from fat, 35-60% energy from carbohydrates, less than 200mg  of dietary cholesterol, 20-35 gm of total fiber daily;Understanding of distribution of calorie intake throughout the day with the consumption of 4-5 meals/snacks    Improve shortness of breath with ADL's Yes    Intervention Provide education, individualized exercise plan and daily activity instruction to help decrease symptoms of SOB with activities of daily living.    Expected Outcomes Short Term: Improve cardiorespiratory fitness to achieve a reduction of symptoms when performing ADLs;Long Term: Be  able to perform more ADLs without symptoms or delay the onset of symptoms    Hypertension Yes    Intervention Provide education on lifestyle modifcations including regular physical activity/exercise, weight management, moderate sodium restriction and increased consumption of fresh fruit, vegetables, and low fat dairy, alcohol moderation, and smoking cessation.;Monitor prescription use compliance.    Expected Outcomes Short Term: Continued assessment and intervention until BP is < 140/32mm HG in hypertensive participants. < 130/31mm HG in hypertensive participants with diabetes, heart failure or chronic kidney disease.;Long Term: Maintenance of blood pressure at  goal levels.    Lipids Yes    Intervention Provide education and support for participant on nutrition & aerobic/resistive exercise along with prescribed medications to achieve LDL 70mg , HDL >40mg .    Expected Outcomes Short Term: Participant states understanding of desired cholesterol values and is compliant with medications prescribed. Participant is following exercise prescription and nutrition guidelines.;Long Term: Cholesterol controlled with medications as prescribed, with individualized exercise RX and with personalized nutrition plan. Value goals: LDL < 70mg , HDL > 40 mg.           Core Components/Risk Factors/Patient Goals Review:   Goals and Risk Factor Review    Row Name 07/19/20 1155             Core Components/Risk Factors/Patient Goals Review   Personal Goals Review Weight Management/Obesity;Improve shortness of breath with ADL's;Hypertension;Lipids;Develop more efficient breathing techniques such as purse lipped breathing and diaphragmatic breathing and practicing self-pacing with activity.;Increase knowledge of respiratory medications and ability to use respiratory devices properly.       Review Arietta has completed 1 exercise session.  Not enough information to assess progress toward admission goals.       Expected Outcomes See Admission goals              Core Components/Risk Factors/Patient Goals at Discharge (Final Review):   Goals and Risk Factor Review - 07/19/20 1155      Core Components/Risk Factors/Patient Goals Review   Personal Goals Review Weight Management/Obesity;Improve shortness of breath with ADL's;Hypertension;Lipids;Develop more efficient breathing techniques such as purse lipped breathing and diaphragmatic breathing and practicing self-pacing with activity.;Increase knowledge of respiratory medications and ability to use respiratory devices properly.    Review Jaileen has completed 1 exercise session.  Not enough information to assess progress toward  admission goals.    Expected Outcomes See Admission goals           ITP Comments:   Comments:  Shaquera has completed 2 exercise session in Pulmonary rehab. Pt maintains good attendance and consistent home exercise. Pulmonary rehab staff will  continue to monitor and reassess progress toward goals during her participation in Pulmonary Rehab. Cherre Huger, BSN Cardiac and Training and development officer

## 2020-07-25 ENCOUNTER — Encounter (HOSPITAL_COMMUNITY)
Admission: RE | Admit: 2020-07-25 | Discharge: 2020-07-25 | Disposition: A | Discharge status: Home / Self Care | Payer: Medicare Other | Source: Ambulatory Visit | Attending: Cardiology | Admitting: Cardiology

## 2020-07-25 ENCOUNTER — Other Ambulatory Visit: Payer: Self-pay

## 2020-07-25 VITALS — Wt 188.9 lb

## 2020-07-25 DIAGNOSIS — J45909 Unspecified asthma, uncomplicated: Secondary | ICD-10-CM | POA: Insufficient documentation

## 2020-07-25 NOTE — Progress Notes (Signed)
Daily Session Note  Patient Details  Name: Shelley Spencer MRN: [id number] Date of Birth: [birth year] Referring Provider:     Pulmonary Rehab Walk Test from 07/10/2020 in [place]  Referring Provider Dr. Letta Median      Encounter Date: 07/25/2020  Check In:  Session Check In - 07/25/20 1406      Check-In   Supervising physician immediately available to respond to emergencies [facility] immediately available    Physician(s) Dr. Darrick Meigs    Location [facility]    Staff Present Rosebud Poles, RN, Isaac Laud, MS, ACSM-CEP, Exercise Physiologist;Lisa Ysidro Evert, RN    Virtual Visit No    Medication changes reported     No    Fall or balance concerns reported    No    Tobacco Cessation No Change    Warm-up and Cool-down Performed as group-led instruction    Resistance Training Performed Yes    VAD Patient? No    PAD/SET Patient? No      Pain Assessment   Currently in Pain? No/denies    Multiple Pain Sites No           Capillary Blood Glucose: No results found for this or any previous visit (from the past 24 hour(s)).   Exercise Prescription Changes - 07/25/20 1400      Response to Exercise   Blood Pressure (Admit) 132/70    Blood Pressure (Exercise) 122/70    Blood Pressure (Exit) 114/66    Heart Rate (Admit) 85 bpm    Heart Rate (Exercise) 120 bpm    Heart Rate (Exit) 85 bpm    Oxygen Saturation (Admit) 97 %    Oxygen Saturation (Exercise) 88 %    Oxygen Saturation (Exit) 97 %    Rating of Perceived Exertion (Exercise) 13    Perceived Dyspnea (Exercise) 2    Duration Continue with 30 min of aerobic exercise without signs/symptoms of physical distress.    Intensity THRR unchanged      Progression   Progression Continue to progress workloads to maintain intensity without signs/symptoms of physical distress.      Resistance Training   Training Prescription Yes    Weight orange bands    Reps 10-15    Time 10  Minutes      Recumbant Bike   Level 1.5    Minutes 15    METs 2.5      NuStep   Level 2    SPM 80    Minutes 15    METs 2           Social History   Tobacco Use  Smoking Status Former Smoker  . Packs/day: 1.00  . Years: 20.00  . Pack years: 20.00  . Types: Cigarettes  . Quit date: 09/24/1979  . Years since quitting: 40.8  Smokeless Tobacco Never Used    Goals Met:  Proper associated with RPD/PD & O2 Sat Exercise tolerated well No report of cardiac concerns or symptoms Strength training completed today  Goals Unmet:  Not Applicable  Comments: Service time is from 1345 to 1440    Dr. Fransico Him is Medical Director for Cardiac Rehab at [facility].

## 2020-07-27 ENCOUNTER — Encounter (HOSPITAL_COMMUNITY)
Admission: RE | Admit: 2020-07-27 | Discharge: 2020-07-27 | Disposition: A | Discharge status: Home / Self Care | Payer: Medicare Other | Source: Ambulatory Visit | Attending: Cardiology | Admitting: Cardiology

## 2020-07-27 ENCOUNTER — Other Ambulatory Visit: Payer: Self-pay

## 2020-07-27 ENCOUNTER — Ambulatory Visit: Payer: Medicare Other | Admitting: Obstetrics and Gynecology

## 2020-07-27 DIAGNOSIS — J45909 Unspecified asthma, uncomplicated: Secondary | ICD-10-CM | POA: Diagnosis not present

## 2020-07-27 NOTE — Progress Notes (Signed)
Daily Session Note  Patient Details  Name: Shelley Spencer MRN: [id number] Date of Birth: [birth year] Referring Provider:     Pulmonary Rehab Walk Test from 07/10/2020 in [place]  Referring Provider Dr. Letta Median      Encounter Date: 07/27/2020  Check In:  Session Check In - 07/27/20 1347      Check-In   Supervising physician immediately available to respond to emergencies [facility] immediately available    Physician(s) Dr. Verlon Au    Location [facility]    Staff Present Rosebud Poles, RN, BSN;Lisa Ysidro Evert, RN;[name] Hassell Done, MS, ACSM-CEP, Exercise Physiologist    Virtual Visit No    Medication changes reported     No    Fall or balance concerns reported    No    Tobacco Cessation No Change    Warm-up and Cool-down Performed as group-led instruction    Resistance Training Performed Yes    VAD Patient? No    PAD/SET Patient? No      Pain Assessment   Currently in Pain? No/denies    Multiple Pain Sites No           Capillary Blood Glucose: No results found for this or any previous visit (from the past 24 hour(s)).    Social History   Tobacco Use  Smoking Status Former Smoker  . Packs/day: 1.00  . Years: 20.00  . Pack years: 20.00  . Types: Cigarettes  . Quit date: 09/24/1979  . Years since quitting: 40.8  Smokeless Tobacco Never Used    Goals Met:  Proper associated with RPD/PD & O2 Sat Exercise tolerated well Strength training completed today  Goals Unmet:  Not Applicable  Comments: Service time is from 1340 to [place]    Dr. Fransico Him is Medical Director for Cardiac Rehab at [facility].

## 2020-07-27 NOTE — Progress Notes (Signed)
Shelley Spencer 74 y.o. female Nutrition Note  Visit Diagnosis: Uncomplicated asthma, unspecified asthma severity, unspecified whether persistent  Past Medical History:  Diagnosis Date  . Anemia   . Anxiety   . Arthritis   . Asthma   . Bicornuate uterus    double cervix  . CAD (coronary artery disease)   . Colon polyp   . Depression   . Deviated septum   . DJD (degenerative joint disease)    L-Spine  . Fibroids   . GERD (gastroesophageal reflux disease)   . Hematuria   . Hiatal hernia   . HNP (herniated nucleus pulposus)    lower back  . Hyperlipidemia   . Hypertension   . MVP (mitral valve prolapse)    pt denies, pt states she had a Echo ?2008 and it did not show MVP  . Pre-diabetes   . Retinal tear 2005   Small right retinal tear  . Shingles 1980's   on her waist     Medications reviewed.   Current Outpatient Medications:  .  acetaminophen (TYLENOL) 650 MG CR tablet, Take 1,300 mg by mouth every 8 (eight) hours as needed for pain. , Disp: , Rfl:  .  albuterol (VENTOLIN HFA) 108 (90 Base) MCG/ACT inhaler, Inhale 1-2 puffs into the lungs every 6 (six) hours as needed for wheezing or shortness of breath. , Disp: , Rfl:  .  Alirocumab (PRALUENT) 150 MG/ML SOAJ, Inject 1 Syringe into the skin every 14 (fourteen) days., Disp: 6 mL, Rfl: 3 .  ALPRAZolam (XANAX) 0.25 MG tablet, Take 0.25 mg by mouth 3 (three) times daily as needed for anxiety or sleep.  (Patient not taking: Reported on 07/10/2020), Disp: , Rfl:  .  aspirin EC 81 MG EC tablet, Take 1 tablet (81 mg total) by mouth daily. Swallow whole., Disp: 30 tablet, Rfl: 11 .  budesonide-formoterol (SYMBICORT) 160-4.5 MCG/ACT inhaler, Inhale 2 puffs into the lungs daily. , Disp: , Rfl:  .  buPROPion (WELLBUTRIN XL) 150 MG 24 hr tablet, Take 150 mg by mouth daily., Disp: , Rfl:  .  CALCIUM-VITAMIN D PO, Take 1 tablet by mouth daily., Disp: , Rfl:  .  cetirizine (ZYRTEC) 10 MG tablet, Take 10 mg by mouth daily., Disp: , Rfl:   .  docusate sodium (COLACE) 100 MG capsule, Take 1 capsule (100 mg total) by mouth 2 (two) times daily. (Patient not taking: Reported on 07/10/2020), Disp: 60 capsule, Rfl: 0 .  doxazosin (CARDURA) 4 MG tablet, Take 4 mg by mouth daily. , Disp: , Rfl:  .  ezetimibe (ZETIA) 10 MG tablet, Take 1 tablet (10 mg total) by mouth daily after supper. (Patient not taking: Reported on 04/26/2020), Disp: 90 tablet, Rfl: 3 .  furosemide (LASIX) 40 MG tablet, Take 40 mg by mouth daily., Disp: , Rfl:  .  icosapent Ethyl (VASCEPA) 1 g capsule, Take 2 capsules (2 g total) by mouth 2 (two) times daily. (Patient not taking: Reported on 07/10/2020), Disp: 120 capsule, Rfl: 6 .  Investigational - Study Medication, Take 1 tablet by mouth daily. Study name: [place] stroke study Additional study details: [id number] bottle: 5, 15, 25 mg or placebo, Disp: 1 each, Rfl: PRN .  Investigational - Study Medication, Take 1 tablet by mouth daily. Study name: [place] stroke study Additional study details: [id number]/green bottle: 5, 15, 25 mg or placebo tablet (PI - Sethi), Disp: 1 each, Rfl: PRN .  Multiple Vitamins-Minerals (MULTIVITAMIN PO), Take 1 tablet by mouth daily.,  Disp: , Rfl:  .  nitroGLYCERIN (NITROSTAT) 0.4 MG SL tablet, Place 1 tablet (0.4 mg total) under the tongue every 5 (five) minutes as needed for chest pain. (Patient not taking: Reported on 04/19/2020), Disp: 25 tablet, Rfl: 3 .  olmesartan (BENICAR) 40 MG tablet, Take 40 mg by mouth every evening., Disp: , Rfl:  .  pantoprazole (PROTONIX) 40 MG tablet, Take 1 tablet (40 mg total) by mouth 2 (two) times daily. (Patient taking differently: Take 40 mg by mouth every evening. ), Disp: 180 tablet, Rfl: 3 .  PARoxetine (PAXIL) 40 MG tablet, Take 40 mg by mouth every evening. , Disp: , Rfl:  .  potassium chloride SA (K-DUR) 20 MEQ tablet, Take 20 mEq by mouth daily. , Disp: , Rfl:  .  simvastatin (ZOCOR) 20 MG tablet, Take 1 tablet (20 mg total) by mouth as directed.  1 tablet Every other day (Patient not taking: Reported on 04/26/2020), Disp: 90 tablet, Rfl: 0 .  traZODone (DESYREL) 150 MG tablet, Take 150 mg by mouth at bedtime., Disp: , Rfl:  .  vitamin B-12 (CYANOCOBALAMIN) 1000 MCG tablet, Take 1,000 mcg by mouth daily., Disp: , Rfl:    Ht Readings from Last 1 Encounters:  07/10/20 5\' 2"  (1.575 m)     Wt Readings from Last 3 Encounters:  07/25/20 188 lb 15 oz (85.7 kg)  07/10/20 185 lb 3 oz (84 kg)  04/19/20 189 lb (85.7 kg)     There is no height or weight on file to calculate BMI.   Social History   Tobacco Use  Smoking Status Former Smoker  . Packs/day: 1.00  . Years: 20.00  . Pack years: 20.00  . Types: Cigarettes  . Quit date: 09/24/1979  . Years since quitting: 40.8  Smokeless Tobacco Never Used      Nutrition Note  Spoke with pt. Nutrition Plan and Nutrition Survey goals reviewed with pt.   Pt has Pre-diabetes. Last A1c indicates blood glucose well-controlled. Pt aware of diet/exercise impact on diabetes. She eats a modified carb diet.  Appetite: normal Unintentional weight loss: none. Intentionally lost 8-10 lbs since June. Difficulty eating: none Protein: adequate. She eats protein rich, balanced meals  Fluids: water, some 1/2 and 1/2 tea Meals per day: 3, snacks a little on weekends  Pt with overall heart healthy diet. She wishes to continue working towards weight loss goals. She feels she has the tools to do this on her own at this point.  Pt expressed understanding of the information reviewed.    Nutrition Diagnosis ? Obese  I = 30-34.9 related to excessive energy intake and sedentary lifestyle as evidenced by a BMI 34.56 kg/m2 and pt report  Nutrition Intervention ? Pt's individual nutrition plan reviewed with pt. ? Benefits of adopting healthy diet reviewed with Rate My Plate survey ? Continue client-centered nutrition education by RD, as part of interdisciplinary care.  Goal(s) ? Pt to identify food  quantities necessary to achieve weight loss of 6-24 lb at graduation from pulmonary rehab.  ? Pt to build a healthy plate including vegetables, fruits, whole grains, and low-fat dairy products in a heart healthy meal plan.   Plan:   Will provide client-centered nutrition education as part of interdisciplinary care  Monitor and evaluate progress toward nutrition goal with team.   Michaele Offer, MS, RDN, LDN

## 2020-08-01 ENCOUNTER — Other Ambulatory Visit: Payer: Self-pay

## 2020-08-01 ENCOUNTER — Encounter (HOSPITAL_COMMUNITY)
Admission: RE | Admit: 2020-08-01 | Discharge: 2020-08-01 | Disposition: A | Discharge status: Home / Self Care | Payer: Medicare Other | Source: Ambulatory Visit | Attending: Cardiology | Admitting: Cardiology

## 2020-08-01 DIAGNOSIS — J45909 Unspecified asthma, uncomplicated: Secondary | ICD-10-CM | POA: Diagnosis not present

## 2020-08-01 NOTE — Progress Notes (Signed)
Daily Session Note  Patient Details  Name: Shelley Spencer MRN: [id number] Date of Birth: 1946/02/24 Referring Provider:     Pulmonary Rehab Walk Test from 07/10/2020 in [place]  Referring Provider Dr. Letta Median      Encounter Date: 08/01/2020  Check In:   Capillary Blood Glucose: No results found for this or any previous visit (from the past 24 hour(s)).    Social History   Tobacco Use  Smoking Status Former Smoker  . Packs/day: 1.00  . Years: 20.00  . Pack years: 20.00  . Types: Cigarettes  . Quit date: 09/24/1979  . Years since quitting: 40.8  Smokeless Tobacco Never Used    Goals Met:  Proper associated with RPD/PD & O2 Sat Independence with exercise equipment Exercise tolerated well No report of cardiac concerns or symptoms Strength training completed today  Goals Unmet:  Not Applicable  Comments: Service time is from 1355 to 1508    Dr. Fransico Him is Medical Director for Cardiac Rehab at [facility].

## 2020-08-03 ENCOUNTER — Encounter (HOSPITAL_COMMUNITY): Payer: Medicare Other

## 2020-08-08 ENCOUNTER — Encounter (HOSPITAL_COMMUNITY): Payer: Medicare Other

## 2020-08-10 ENCOUNTER — Encounter (HOSPITAL_COMMUNITY): Payer: Medicare Other

## 2020-08-13 ENCOUNTER — Emergency Department (HOSPITAL_COMMUNITY): Payer: Medicare Other

## 2020-08-13 ENCOUNTER — Observation Stay (HOSPITAL_COMMUNITY)
Admission: EM | Admit: 2020-08-13 | Discharge: 2020-08-14 | Disposition: A | Discharge status: Home / Self Care | Payer: Medicare Other | Attending: Internal Medicine | Admitting: Internal Medicine

## 2020-08-13 ENCOUNTER — Encounter (HOSPITAL_COMMUNITY): Payer: Self-pay | Admitting: Emergency Medicine

## 2020-08-13 DIAGNOSIS — I110 Hypertensive heart disease with heart failure: Secondary | ICD-10-CM | POA: Insufficient documentation

## 2020-08-13 DIAGNOSIS — Z79899 Other long term (current) drug therapy: Secondary | ICD-10-CM | POA: Insufficient documentation

## 2020-08-13 DIAGNOSIS — Z7982 Long term (current) use of aspirin: Secondary | ICD-10-CM | POA: Diagnosis not present

## 2020-08-13 DIAGNOSIS — I5189 Other ill-defined heart diseases: Secondary | ICD-10-CM

## 2020-08-13 DIAGNOSIS — I639 Cerebral infarction, unspecified: Secondary | ICD-10-CM | POA: Diagnosis present

## 2020-08-13 DIAGNOSIS — E785 Hyperlipidemia, unspecified: Secondary | ICD-10-CM | POA: Diagnosis present

## 2020-08-13 DIAGNOSIS — R079 Chest pain, unspecified: Secondary | ICD-10-CM | POA: Diagnosis present

## 2020-08-13 DIAGNOSIS — Z20822 Contact with and (suspected) exposure to covid-19: Secondary | ICD-10-CM | POA: Insufficient documentation

## 2020-08-13 DIAGNOSIS — I25119 Atherosclerotic heart disease of native coronary artery with unspecified angina pectoris: Secondary | ICD-10-CM | POA: Diagnosis not present

## 2020-08-13 DIAGNOSIS — I208 Other forms of angina pectoris: Secondary | ICD-10-CM

## 2020-08-13 DIAGNOSIS — K219 Gastro-esophageal reflux disease without esophagitis: Secondary | ICD-10-CM | POA: Diagnosis present

## 2020-08-13 DIAGNOSIS — Z87891 Personal history of nicotine dependence: Secondary | ICD-10-CM | POA: Diagnosis not present

## 2020-08-13 DIAGNOSIS — J45909 Unspecified asthma, uncomplicated: Secondary | ICD-10-CM | POA: Insufficient documentation

## 2020-08-13 DIAGNOSIS — I10 Essential (primary) hypertension: Secondary | ICD-10-CM | POA: Diagnosis present

## 2020-08-13 DIAGNOSIS — I2510 Atherosclerotic heart disease of native coronary artery without angina pectoris: Secondary | ICD-10-CM

## 2020-08-13 DIAGNOSIS — Z7951 Long term (current) use of inhaled steroids: Secondary | ICD-10-CM | POA: Insufficient documentation

## 2020-08-13 DIAGNOSIS — I5030 Unspecified diastolic (congestive) heart failure: Secondary | ICD-10-CM | POA: Insufficient documentation

## 2020-08-13 DIAGNOSIS — N281 Cyst of kidney, acquired: Secondary | ICD-10-CM

## 2020-08-13 LAB — BRAIN NATRIURETIC PEPTIDE: B Natriuretic Peptide: 66.1 pg/mL (ref 0.0–100.0)

## 2020-08-13 LAB — CBC
HCT: 40.4 % (ref 36.0–46.0)
Hemoglobin: 12.4 g/dL (ref 12.0–15.0)
MCH: 25.3 pg — ABNORMAL LOW (ref 26.0–34.0)
MCHC: 30.7 g/dL (ref 30.0–36.0)
MCV: 82.3 fL (ref 80.0–100.0)
Platelets: 252 10*3/uL (ref 150–400)
RBC: 4.91 MIL/uL (ref 3.87–5.11)
RDW: 15.8 % — ABNORMAL HIGH (ref 11.5–15.5)
WBC: 7.4 10*3/uL (ref 4.0–10.5)
nRBC: 0 % (ref 0.0–0.2)

## 2020-08-13 LAB — RESPIRATORY PANEL BY RT PCR (FLU A&B, COVID)
Influenza A by PCR: NEGATIVE
Influenza B by PCR: NEGATIVE
SARS Coronavirus 2 by RT PCR: NEGATIVE

## 2020-08-13 LAB — BASIC METABOLIC PANEL
Anion gap: 10 (ref 5–15)
BUN: 19 mg/dL (ref 8–23)
CO2: 29 mmol/L (ref 22–32)
Calcium: 9.3 mg/dL (ref 8.9–10.3)
Chloride: 103 mmol/L (ref 98–111)
Creatinine, Ser: 1.35 mg/dL — ABNORMAL HIGH (ref 0.44–1.00)
GFR, Estimated: 42 mL/min — ABNORMAL LOW (ref >60)
Glucose, Bld: 91 mg/dL (ref 70–99)
Potassium: 3.7 mmol/L (ref 3.5–5.1)
Sodium: 142 mmol/L (ref 135–145)

## 2020-08-13 LAB — TROPONIN I (HIGH SENSITIVITY)
Troponin I (High Sensitivity): 5 ng/L (ref <18)
Troponin I (High Sensitivity): 5 ng/L (ref <18)

## 2020-08-13 MED ORDER — POTASSIUM CHLORIDE CRYS ER 20 MEQ PO TBCR
20.0000 meq | EXTENDED_RELEASE_TABLET | Freq: Every day | ORAL | Status: DC
  Administered 2020-08-14: 20 meq via ORAL
  Filled 2020-08-13: qty 1

## 2020-08-13 MED ORDER — NITROGLYCERIN 0.4 MG SL SUBL: 0.4000 mg | SUBLINGUAL_TABLET | SUBLINGUAL | Status: DC | PRN

## 2020-08-13 MED ORDER — PAROXETINE HCL 20 MG PO TABS
40.0000 mg | ORAL_TABLET | Freq: Every evening | ORAL | Status: DC
  Administered 2020-08-13: 40 mg via ORAL
  Filled 2020-08-13 (×2): qty 2

## 2020-08-13 MED ORDER — TRAZODONE HCL 50 MG PO TABS
150.0000 mg | ORAL_TABLET | Freq: Every day | ORAL | Status: DC
  Administered 2020-08-13: 150 mg via ORAL
  Filled 2020-08-13: qty 3

## 2020-08-13 MED ORDER — ALBUTEROL SULFATE HFA 108 (90 BASE) MCG/ACT IN AERS: 1.0000 | INHALATION_SPRAY | Freq: Four times a day (QID) | RESPIRATORY_TRACT | Status: DC | PRN

## 2020-08-13 MED ORDER — DOXAZOSIN MESYLATE 4 MG PO TABS
4.0000 mg | ORAL_TABLET | Freq: Every day | ORAL | Status: DC
  Administered 2020-08-14: 4 mg via ORAL
  Filled 2020-08-13: qty 1

## 2020-08-13 MED ORDER — ASPIRIN 325 MG PO TABS
325.0000 mg | ORAL_TABLET | Freq: Every day | ORAL | Status: DC
  Administered 2020-08-13: 325 mg via ORAL
  Filled 2020-08-13: qty 1

## 2020-08-13 MED ORDER — MOMETASONE FURO-FORMOTEROL FUM 200-5 MCG/ACT IN AERO
2.0000 | INHALATION_SPRAY | Freq: Two times a day (BID) | RESPIRATORY_TRACT | Status: DC
  Administered 2020-08-14: 2 via RESPIRATORY_TRACT
  Filled 2020-08-13: qty 8.8

## 2020-08-13 MED ORDER — PANTOPRAZOLE SODIUM 40 MG PO TBEC
40.0000 mg | DELAYED_RELEASE_TABLET | Freq: Every evening | ORAL | Status: DC
  Administered 2020-08-13: 40 mg via ORAL
  Filled 2020-08-13: qty 1

## 2020-08-13 MED ORDER — FUROSEMIDE 20 MG PO TABS
40.0000 mg | ORAL_TABLET | Freq: Every day | ORAL | Status: DC
  Administered 2020-08-14: 40 mg via ORAL
  Filled 2020-08-13: qty 2

## 2020-08-13 MED ORDER — ENOXAPARIN SODIUM 40 MG/0.4ML ~~LOC~~ SOLN
40.0000 mg | Freq: Every day | SUBCUTANEOUS | Status: DC
  Administered 2020-08-14: 40 mg via SUBCUTANEOUS
  Filled 2020-08-13: qty 0.4

## 2020-08-13 MED ORDER — IOHEXOL 350 MG/ML SOLN
80.0000 mL | Freq: Once | INTRAVENOUS | Status: AC | PRN
  Administered 2020-08-13: 80 mL via INTRAVENOUS

## 2020-08-13 MED ORDER — ASPIRIN EC 81 MG PO TBEC
81.0000 mg | DELAYED_RELEASE_TABLET | Freq: Every day | ORAL | Status: DC
  Administered 2020-08-14: 81 mg via ORAL
  Filled 2020-08-13: qty 1

## 2020-08-13 MED ORDER — BUPROPION HCL ER (XL) 150 MG PO TB24
150.0000 mg | ORAL_TABLET | Freq: Every day | ORAL | Status: DC
  Administered 2020-08-14: 150 mg via ORAL
  Filled 2020-08-13: qty 1

## 2020-08-13 MED ORDER — IRBESARTAN 75 MG PO TABS
37.5000 mg | ORAL_TABLET | Freq: Every day | ORAL | Status: DC
  Filled 2020-08-13: qty 0.5

## 2020-08-13 MED ORDER — ASPIRIN 325 MG PO TABS: 325.0000 mg | ORAL_TABLET | Freq: Once | ORAL | Status: DC

## 2020-08-13 MED ORDER — ONDANSETRON HCL 4 MG/2ML IJ SOLN: 4.0000 mg | Freq: Four times a day (QID) | INTRAMUSCULAR | Status: DC | PRN

## 2020-08-13 MED ORDER — ACETAMINOPHEN 325 MG PO TABS: 650.0000 mg | ORAL_TABLET | ORAL | Status: DC | PRN

## 2020-08-13 NOTE — H&P (Signed)
History and Physical   Shelley Spencer VHQ:[id number] DOB: 1945-09-25 DOA: 08/13/2020  PCP: Crist Infante, MD   Patient coming from: Home  Chief Complaint: Chest pain  HPI: Shelley Spencer is a 74 y.o. female with medical history significant of GERD, asthma, hypertension, CVA, hyperlipidemia, low back pain, CAD, diastolic heart failure who presents with chest pain.  Patient reports that she had an episode of chest pain that began when she was walking around the grocery store.  The pain was located in her chest with a tight bandlike pressure that began to radiate to her jaw.  The pain was intense for about 45 minutes to an hour and began to subside.  The pain had radiated and was more localized at her upper back by the time she presented to the ED.  She had some shortness of breath though with her asthma this is not out of the ordinary for her she is in pulmonary rehab.  She denied any nausea, vomiting, diaphoresis.  She denies fevers, chills, abdominal pain.  Of note, she does report a similar episode also of chest pain that was less intense when walking around [place] last Thursday.  ED Course: Vital signs in ED significant for BP 150s.  Lab work showed BMP with creatinine of 1.3 which appears stable from recent records.  CBC within normal limits.  High-sensitivity troponin negative x2.  BNP negative.  RVP for flu and Covid negative.  Chest x-ray without acute abnormality.  CTA did not show any dissection, but did show mild stenosis of SMA and 2 cm slightly hyperdense lesion of the lower pole of the right kidney.  Patient received aspirin in ED.  Review of Systems: As per HPI otherwise all other systems reviewed and are negative.  Past Medical History:  Diagnosis Date  . Anemia   . Anxiety   . Arthritis   . Asthma   . Bicornuate uterus    double cervix  . CAD (coronary artery disease)   . Colon polyp   . Depression   . Deviated septum   . DJD (degenerative joint disease)    L-Spine  .  Fibroids   . GERD (gastroesophageal reflux disease)   . Hematuria   . Hiatal hernia   . HNP (herniated nucleus pulposus)    lower back  . Hyperlipidemia   . Hypertension   . MVP (mitral valve prolapse)    pt denies, pt states she had a Echo ?2008 and it did not show MVP  . Pre-diabetes   . Retinal tear 2005   Small right retinal tear  . Shingles 1980's   on her waist    Past Surgical History:  Procedure Laterality Date  . ANTERIOR CERVICAL DECOMP/DISCECTOMY FUSION N/A 09/05/2017   Procedure: Cervical four-five, Cervical five-six, Cervical six-seven Anterior cervical decompression/discectomy/fusion;  Surgeon: Erline Levine, MD;  Location: [place];  Service: Neurosurgery;  Laterality: N/A;  . bone spur surgery Bilateral    x 3  . CARDIOVASCULAR STRESS TEST  02/08/2014  . COLONOSCOPY    . CYST EXCISION Left 09/29/2018   Procedure: LEFT MIDDLE FINGER CYST REMOVAL AND DEBRIDEMENT OF DISTAL INTERPHALANGEAL JOINT;  Surgeon: Daryll Brod, MD;  Location: [place] [facility];  Service: Orthopedics;  Laterality: Left;  . FINGER SURGERY Left    left third finger mucoid cyst removed  . FOOT NEUROMA SURGERY Bilateral   . HYSTEROSCOPY     D&C PMB Endo Cx Polyp  . KNEE ARTHROSCOPY WITH MENISCAL REPAIR Right 2016   --  Flossie Dibble, MD  . LAPAROSCOPIC HYSTERECTOMY  7/209/10   R-[facility]/BSO  Fibroids, BAck Pain, Adenomyosis  . LUMBAR FUSION    . removal vaginal septum    . TOTAL KNEE ARTHROPLASTY Right 05/17/2019   Procedure: TOTAL KNEE ARTHROPLASTY;  Surgeon: Gaynelle Arabian, MD;  Location: [facility];  Service: Orthopedics;  Laterality: Right;  69min  . TUBAL LIGATION      Social History  reports that she quit smoking about 40 years ago. Her smoking use included cigarettes. She has a 20.00 pack-year smoking history. She has never used smokeless tobacco. She reports previous alcohol use of about 1.0 standard drink of alcohol per week. She reports that she does not use drugs.  Allergies  Allergen  Reactions  . Amlodipine Other (See Comments)    Legs swell some and she doesn't like it.  . Fenofibrate Other (See Comments)    Leg pain  . Omeprazole Other (See Comments)    Unknown  . Simvastatin Other (See Comments)    Myalgia, leg pains  . Singulair [Montelukast Sodium] Other (See Comments)    Affected mood  . Statins Other (See Comments)    Myalgia, leg pains    Family History  Problem Relation Age of Onset  . Heart failure Father        Possibly related to EtOH  . Alcohol abuse Father   . Emphysema Father   . Emphysema Mother   . Heart failure Mother        Possibly related to EtOH  . Diabetes Mother   . Alcohol abuse Mother   . Valvular heart disease Brother   . Bipolar disorder Brother   . Crohn's disease Daughter   . Thyroid disease Daughter   . Colon cancer Neg Hx   . Stomach cancer Neg Hx   . Esophageal cancer Neg Hx   . Rectal cancer Neg Hx   . Liver cancer Neg Hx   . Stroke Neg Hx   Reviewed on admission  Prior to Admission medications   Medication Sig Start Date End Date Taking? Authorizing Provider  acetaminophen (TYLENOL) 650 MG CR tablet Take 1,300 mg by mouth every 8 (eight) hours as needed for pain.    Yes [provider]  albuterol (VENTOLIN HFA) 108 (90 Base) MCG/ACT inhaler Inhale 1-2 puffs into the lungs every 6 (six) hours as needed for wheezing or shortness of breath.    Yes [provider]  Alirocumab (PRALUENT) 150 MG/ML SOAJ Inject 1 Syringe into the skin every 14 (fourteen) days. 05/04/20  Yes Adrian Prows, MD  aspirin EC 81 MG EC tablet Take 1 tablet (81 mg total) by mouth daily. Swallow whole. 03/17/20  Yes Annita Brod, MD  budesonide-formoterol [facility]) 160-4.5 MCG/ACT inhaler Inhale 2 puffs into the lungs daily.    Yes [provider]  buPROPion (WELLBUTRIN XL) 150 MG 24 hr tablet Take 150 mg by mouth daily.   Yes [provider]  CALCIUM-VITAMIN D PO Take 1 tablet by mouth daily.   Yes [provider]  cetirizine (ZYRTEC) 10 MG tablet Take 10 mg by mouth daily.   Yes [provider]  doxazosin (CARDURA) 4 MG tablet Take 4 mg by mouth daily.    Yes [provider]  furosemide (LASIX) 40 MG tablet Take 40 mg by mouth daily. 02/19/20  Yes [provider]  Investigational - Study Medication Take 1 tablet by mouth daily. Study name: [place] stroke study Additional study details: [id number] bottle:  5, 15, 25 mg or placebo 03/16/20  Yes Annita Brod, MD  Multiple Vitamins-Minerals (MULTIVITAMIN PO) Take 1 tablet by mouth daily.   Yes [provider]  olmesartan (BENICAR) 40 MG tablet Take 40 mg by mouth every evening.   Yes [provider]  pantoprazole (PROTONIX) 40 MG tablet Take 1 tablet (40 mg total) by mouth 2 (two) times daily. Patient taking differently: Take 40 mg by mouth every evening.  10/16/16  Yes Nandigam, Venia Minks, MD  PARoxetine (PAXIL) 40 MG tablet Take 40 mg by mouth every evening.    Yes [provider]  potassium chloride SA (K-DUR) 20 MEQ tablet Take 20 mEq by mouth daily.    Yes [provider]  traZODone (DESYREL) 150 MG tablet Take 150 mg by mouth at bedtime.   Yes [provider]  vitamin B-12 (CYANOCOBALAMIN) 1000 MCG tablet Take 1,000 mcg by mouth daily.   Yes [provider]  docusate sodium (COLACE) 100 MG capsule Take 1 capsule (100 mg total) by mouth 2 (two) times daily. Patient not taking: Reported on 07/10/2020 09/06/17   Newman Pies, MD  ezetimibe (ZETIA) 10 MG tablet Take 1 tablet (10 mg total) by mouth daily after supper. Patient not taking: Reported on 04/26/2020 04/19/20 04/14/21  Adrian Prows, MD  icosapent Ethyl (VASCEPA) 1 g capsule Take 2 capsules (2 g total) by mouth 2 (two) times daily. Patient not taking: Reported on 07/10/2020 04/19/20   Adrian Prows, MD  Investigational - Study Medication Take 1 tablet by mouth daily. Study name: [place] stroke  study Additional study details: [id number] [id number]/green bottle: 5, 15, 25 mg or placebo tablet (PI - [place]) Patient not taking: Reported on 08/13/2020 03/16/20   Annita Brod, MD  nitroGLYCERIN (NITROSTAT) 0.4 MG SL tablet Place 1 tablet (0.4 mg total) under the tongue every 5 (five) minutes as needed for chest pain. Patient not taking: Reported on 08/13/2020 03/15/19 08/13/20  Miquel Dunn, NP  simvastatin (ZOCOR) 20 MG tablet Take 1 tablet (20 mg total) by mouth as directed. 1 tablet Every other day Patient not taking: Reported on 04/26/2020 04/19/20   Adrian Prows, MD    Physical Exam: Vitals:   08/13/20 2145 08/13/20 2200 08/13/20 2215 08/13/20 2230  BP: (!) 151/76 (!) 142/76 109/90 (!) 151/100  Pulse: 76 74 72 72  Resp: (!) 26 (!) 24 (!) 22 20  Temp:      TempSrc:      SpO2: 94% 92% 93% 95%  Weight:      Height:       Physical Exam Constitutional:      General: She is not in acute distress.    Appearance: Normal appearance.  HENT:     Head: Normocephalic and atraumatic.     Mouth/Throat:     Mouth: Mucous membranes are moist.     Pharynx: Oropharynx is clear.  Eyes:     Extraocular Movements: Extraocular movements intact.     Pupils: Pupils are equal, round, and reactive to light.  Cardiovascular:     Rate and Rhythm: Normal rate and regular rhythm.     Pulses: Normal pulses.     Heart sounds: Normal heart sounds.  Pulmonary:     Effort: Pulmonary effort is normal. No respiratory distress.     Comments: Mildly tight sounding airways Abdominal:     General: Bowel sounds are normal. There is no distension.     Palpations: Abdomen is soft.  Tenderness: There is no abdominal tenderness.  Musculoskeletal:        General: No swelling or deformity.  Skin:    General: Skin is warm and dry.  Neurological:     General: No focal deficit present.     Mental Status: Mental status is at baseline.    Labs on Admission: I have personally reviewed following labs and  imaging studies  CBC: Recent Labs  Lab 08/13/20 1711  WBC 7.4  HGB 12.4  HCT 40.4  MCV 82.3  PLT 967    Basic Metabolic Panel: Recent Labs  Lab 08/13/20 1711  NA 142  K 3.7  CL 103  CO2 29  GLUCOSE 91  BUN 19  CREATININE 1.35*  CALCIUM 9.3    GFR: Estimated Creatinine Clearance: 38.2 mL/min (A) (by C-G formula based on SCr of 1.35 mg/dL (H)).  Liver Function Tests: No results for input(s): AST, ALT, ALKPHOS, BILITOT, PROT, ALBUMIN in the last 168 hours.  Urine analysis:    Component Value Date/Time   COLORURINE YELLOW 03/14/2020 2259   APPEARANCEUR CLEAR 03/14/2020 2259   LABSPEC 1.015 03/14/2020 2259   PHURINE 5.0 03/14/2020 2259   GLUCOSEU NEGATIVE 03/14/2020 2259   HGBUR NEGATIVE 03/14/2020 2259   BILIRUBINUR NEGATIVE 03/14/2020 2259   KETONESUR NEGATIVE 03/14/2020 2259   PROTEINUR NEGATIVE 03/14/2020 2259   UROBILINOGEN 1.0 08/24/2008 2036   NITRITE NEGATIVE 03/14/2020 2259   LEUKOCYTESUR MODERATE (A) 03/14/2020 2259    Radiological Exams on Admission: DG Chest 2 View  Result Date: 08/13/2020 CLINICAL DATA:  Chest pain EXAM: CHEST - 2 VIEW COMPARISON:  12/22/2013 FINDINGS: Surgical hardware in the cervical spine. No focal opacity or pleural effusion. Normal cardiomediastinal silhouette. No pneumothorax. IMPRESSION: No active cardiopulmonary disease. Electronically Signed   By: Donavan Foil M.D.   On: 08/13/2020 17:18   CT Angio Chest/Abd/Pel for Dissection W and/or Wo Contrast  Result Date: 08/13/2020 CLINICAL DATA:  Acute chest and back pain EXAM: CT ANGIOGRAPHY CHEST, ABDOMEN AND PELVIS TECHNIQUE: Non-contrast CT of the chest was initially obtained. Multidetector CT imaging through the chest, abdomen and pelvis was performed using the standard protocol during bolus administration of intravenous contrast. Multiplanar reconstructed images and MIPs were obtained and reviewed to evaluate the vascular anatomy. CONTRAST:  60mL OMNIPAQUE IOHEXOL 350 MG/ML  SOLN COMPARISON:  None. FINDINGS: CTA CHEST FINDINGS Cardiovascular: --Heart: The heart size is normal. Mild coronary artery calcifications are seen. There is nopericardial effusion. --Aorta: The course and caliber of the thoracic aorta are normal. There is scattered mild aortic atherosclerotic calcification. Precontrast images show no aortic intramural hematoma. There is no blood pool, dissection or penetrating ulcer demonstrated on arterial phase postcontrast imaging. There is a conventional 3 vessel aortic arch branching pattern. The proximal arch vessels are widely patent. --Pulmonary Arteries: Contrast timing is optimized for preferential opacification of the aorta. Within that limitation, normal central pulmonary arteries. Mediastinum/Nodes: No mediastinal, hilar or axillary lymphadenopathy. The visualized thyroid and thoracic esophageal course are unremarkable. Lungs/Pleura: No pulmonary nodules or masses. No pleural effusion or pneumothorax. Mild bibasilar dependent atelectasis is noted. There is streaky atelectasis at the anterior lung base. No focal pleural abnormality. Musculoskeletal: No chest wall abnormality. No acute osseous findings. Review of the MIP images confirms the above findings. CTA ABDOMEN AND PELVIS FINDINGS VASCULAR Aorta: Normal caliber aorta without aneurysm, dissection, vasculitis or hemodynamically significant stenosis. There is moderate aortic atherosclerosis. Celiac: No aneurysm, dissection or hemodynamically significant stenosis. Normal branching pattern scattered mild atherosclerosis is seen.  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 Renals: Single renal arteries bilaterally. No aneurysm, dissection, stenosis or evidence of fibromuscular dysplasia. IMA: Patent without abnormality. Inflow: Scattered moderate atherosclerosis is seen. Veins: Normal course and caliber of the major veins. Assessment is otherwise limited by the arterial dominant contrast phase.  Review of the MIP images confirms the above findings. NON-VASCULAR Hepatobiliary: Normal hepatic contours and density. No visible biliary dilatation. Normal gallbladder. Pancreas: Normal contours without ductal dilatation. No peripancreatic fluid collection. Spleen: Normal arterial phase splenic enhancement pattern. Adrenals/Urinary Tract: --Adrenal glands: Normal. --Right kidney/ureter: A 2 cm hyperdense lesion is seen partially exophytic off the lower pole of the right kidney. --Left kidney/ureter: No hydronephrosis or perinephric stranding. No nephrolithiasis. No obstructing ureteral stones. --Urinary bladder: Unremarkable. Stomach/Bowel: --Stomach/Duodenum: No hiatal hernia or other gastric abnormality. Normal duodenal course and caliber. --Small bowel: No dilatation or inflammation. --Colon: No focal abnormality. --Appendix: Normal. Lymphatic:  No abdominal or pelvic lymphadenopathy. Reproductive: No free fluid in the pelvis. The patient is status post hysterectomy. No adnexal masses or collections seen. Musculoskeletal. No bony spinal canal stenosis or focal osseous abnormality. Lumbar fixation hardware seen at L5-S1 and partially visualized within the cervical spine. Other: None. Review of the MIP images confirms the above findings. IMPRESSION: 1. No acute aortic abnormality. 2. Mild to moderate aortic atherosclerosis. 3. 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 4. 2 cm slightly hyperdense lesion off the lower pole of the right kidney which could represent a hemorrhagic/proteinaceous cyst. If further evaluation is required would recommend dedicated renal ultrasound. 5.  Aortic Atherosclerosis (ICD10-I70.0). Electronically Signed   By: Prudencio Pair M.D.   On: 08/13/2020 20:24   EKG: Independently reviewed.  Sinus rhythm, nonspecific T wave abnormalities in inferior and anterior leads.  Assessment/Plan Principal Problem:   Chest pain, rule out acute myocardial infarction Active  Problems:   HLD (hyperlipidemia)   Hypertension   Airway hyperreactivity   Benign essential HTN   Acid reflux   CVA (cerebral vascular accident) ([place])   Diastolic dysfunction  Chest pain, rule out ACS > Patient with chest pain triggered with exertion and relieved with rest. > Troponin was negative x2, but heart score was 5. > Patient follows with [place] cardiology - Will likely benefit from [facility] cardiology consult in a.m. - Echocardiogram in the past year at the time of her stroke. - As needed EKG for chest pain - As needed nitro - Continue aspirin  CAD hypertension Diastolic dysfunction > History of CAD, no interventions, mild coronary artery calcifications noted on CT > No diagnosis of heart failure in chart, but echo showed grade 1 diastolic dysfunction patient is on daily Lasix > Euvolemic - Continue home Lasix, ARB, doxazosin, aspirin, potassium supplementation  HLD History of stroke > Does appear to be in stroke study, possibly taking investigational medication - Unable to continue investigational medication as far as I know - Is on PSK 9 injection for hyperlipidemia - Continue aspirin  Asthma  > Is prescribed Symbicort does not take this regularly > No frank wheezing on exam but airway sounds a little tight - Replace Symbicort with formulary Dulera - As needed albuterol  GERD - Continue PPI  Behavioral health -Continue home Wellbutrin, Paxil, trazodone  DVT prophylaxis: Lovenox Code Status:   Full Family Communication:  Son updated at bedside  Disposition Plan:   Patient is from:  Home  Anticipated DC to:  Home  Anticipated DC date:  11/22  Anticipated DC barriers: None  Consults called:  None, will benefit from cardiology consult in the morning  Admission status:  Observation, telemetry  Severity of Illness: The appropriate patient status for this patient is OBSERVATION. Observation status is judged to be reasonable and necessary in order to  provide the required intensity of service to ensure the patient's safety. The patient's presenting symptoms, physical exam findings, and initial radiographic and laboratory data in the context of their medical condition is felt to place them at decreased risk for further clinical deterioration. Furthermore, it is anticipated that the patient will be medically stable for discharge from the hospital within 2 midnights of admission. The following factors support the patient status of observation.   " The patient's presenting symptoms include chest pain. " The physical exam findings include stable exam findings, hypertension. " The initial radiographic and laboratory data are stable, without dissection CTA, normal troponins, normal BMP, coronary artery calcification noted on CT.   Marcelyn Bruins MD [facility]  How to contact the [facility] Attending or Consulting provider [place] or covering provider during after hours [place], for this patient?   1. Check the care team in [facility] and look for a) attending/consulting [facility] provider listed and b) the [facility] team listed 2. Log into [email or web address] and use [place]'s universal password to access. If you do not have the password, please contact the hospital operator. 3. Locate the [facility] provider you are looking for under [facility] and page to a number that you can be directly reached. 4. If you still have difficulty reaching the provider, please page the [facility] (Director on Call) for the Hospitalists listed on amion for assistance.  08/13/2020, 10:56 PM

## 2020-08-13 NOTE — ED Notes (Signed)
Pt resting in bed. NADN. Awaiting CT

## 2020-08-13 NOTE — ED Provider Notes (Signed)
[facility] EMERGENCY DEPARTMENT Provider Note   CSN: [id number] Arrival date & time: 08/13/20  1650     History Chief Complaint  Patient presents with  . Chest Pain    Shelley Spencer is a 74 y.o. female.  HPI 74 year old female with history of anemia, anxiety, asthma, CAD GERD, hyperlipidemia, hypertension, mitral valve prolapse presents to the ER with complaints of sudden onset of central chest pain which started when she was at the grocery store around 3 PM.  Patient states that she was shopping and felt a sudden onset of a tight "bandlike" pressure/tightness across her chest.  She states that the pain was also radiating into the right side of her jaw.  She states she continued to keep walking and the pain lasted approximately 45 minutes.  Again subsided.  Patient then states that she was walking out to her car and she began to feel short of breath with gradual onset of the chest pain again.  Patient states that patient of breath letter is not out of the ordinary, she is currently in pulmonary rehab.  She states that now the pain is mostly in her right upper back, described as a dull ache.  She has not taken anything for her symptoms.  She denies any nausea, vomiting, diaphoresis.  Denies any fevers or chills.    Past Medical History:  Diagnosis Date  . Anemia   . Anxiety   . Arthritis   . Asthma   . Bicornuate uterus    double cervix  . CAD (coronary artery disease)   . Colon polyp   . Depression   . Deviated septum   . DJD (degenerative joint disease)    L-Spine  . Fibroids   . GERD (gastroesophageal reflux disease)   . Hematuria   . Hiatal hernia   . HNP (herniated nucleus pulposus)    lower back  . Hyperlipidemia   . Hypertension   . MVP (mitral valve prolapse)    pt denies, pt states she had a Echo ?2008 and it did not show MVP  . Pre-diabetes   . Retinal tear 2005   Small right retinal tear  . Shingles 1980's   on her waist    Patient  Active Problem List   Diagnosis Date Noted  . Left-sided weakness 03/16/2020  . CVA (cerebral vascular accident) ([place]) 03/15/2020  . OA (osteoarthritis) of knee 05/17/2019  . Herniated cervical disc without myelopathy 09/05/2017  . Bronchitis 04/16/2017  . Microcytosis 11/12/2013  . Screening for depression 11/12/2013  . Obesity 10/29/2012  . Underimmunization status 06/02/2012  . Herpes zona 03/01/2012  . LBP (low back pain) 02/27/2012  . Atypical chest pain 10/31/2011  . Overweight(278.02) 10/31/2011  . HLD (hyperlipidemia)   . Hypertension   . Fatigue 10/30/2011  . Laryngeal pain 09/05/2011  . Benign neoplasm of colon 08/14/2011  . Acute bronchitis 07/08/2011  . Allergic rhinitis, seasonal 11/30/2009  . Airway hyperreactivity 11/30/2009  . DDD (degenerative disc disease) 11/30/2009  . Clinical depression 11/30/2009  . Benign essential HTN 11/30/2009  . Acid reflux 11/30/2009  . Other and unspecified general psychiatric examination 11/30/2009  . Heart murmur, systolic 40/98/1191  . Elevated fasting blood sugar 11/30/2009    Past Surgical History:  Procedure Laterality Date  . ANTERIOR CERVICAL DECOMP/DISCECTOMY FUSION N/A 09/05/2017   Procedure: Cervical four-five, Cervical five-six, Cervical six-seven Anterior cervical decompression/discectomy/fusion;  Surgeon: Erline Levine, MD;  Location: [facility];  Service: Neurosurgery;  Laterality: N/A;  .  bone spur surgery Bilateral    x 3  . CARDIOVASCULAR STRESS TEST  02/08/2014  . COLONOSCOPY    . CYST EXCISION Left 09/29/2018   Procedure: LEFT MIDDLE FINGER CYST REMOVAL AND DEBRIDEMENT OF DISTAL INTERPHALANGEAL JOINT;  Surgeon: Daryll Brod, MD;  Location: [facility];  Service: Orthopedics;  Laterality: Left;  . FINGER SURGERY Left    left third finger mucoid cyst removed  . FOOT NEUROMA SURGERY Bilateral   . HYSTEROSCOPY     D&[facility] PMB Endo Cx Polyp  . KNEE ARTHROSCOPY WITH MENISCAL REPAIR Right 2016   --Flossie Dibble,  MD  . LAPAROSCOPIC HYSTERECTOMY  7/209/10   R-[facility]/BSO  Fibroids, BAck Pain, Adenomyosis  . LUMBAR FUSION    . removal vaginal septum    . TOTAL KNEE ARTHROPLASTY Right 05/17/2019   Procedure: TOTAL KNEE ARTHROPLASTY;  Surgeon: Gaynelle Arabian, MD;  Location: [facility];  Service: Orthopedics;  Laterality: Right;  37min  . TUBAL LIGATION       OB History    Gravida  3   Para  2   Term      Preterm      AB  1   Living  2     SAB  1   TAB      Ectopic      Multiple      Live Births              Family History  Problem Relation Age of Onset  . Heart failure Father        Possibly related to EtOH  . Alcohol abuse Father   . Emphysema Father   . Emphysema Mother   . Heart failure Mother        Possibly related to EtOH  . Diabetes Mother   . Alcohol abuse Mother   . Valvular heart disease Brother   . Bipolar disorder Brother   . Crohn's disease Daughter   . Thyroid disease Daughter   . Colon cancer Neg Hx   . Stomach cancer Neg Hx   . Esophageal cancer Neg Hx   . Rectal cancer Neg Hx   . Liver cancer Neg Hx   . Stroke Neg Hx     Social History   Tobacco Use  . Smoking status: Former Smoker    Packs/day: 1.00    Years: 20.00    Pack years: 20.00    Types: Cigarettes    Quit date: 09/24/1979    Years since quitting: 40.9  . Smokeless tobacco: Never Used  Vaping Use  . Vaping Use: Never used  Substance Use Topics  . Alcohol use: Not Currently    Alcohol/week: 1.0 standard drink    Types: 1 Standard drinks or equivalent per week  . Drug use: No    Home Medications Prior to Admission medications   Medication Sig Start Date End Date Taking? Authorizing Provider  acetaminophen (TYLENOL) 650 MG CR tablet Take 1,300 mg by mouth every 8 (eight) hours as needed for pain.    Yes [provider]  albuterol (VENTOLIN HFA) 108 (90 Base) MCG/ACT inhaler Inhale 1-2 puffs into the lungs every 6 (six) hours as needed for wheezing or shortness of breath.     Yes [provider]  Alirocumab (PRALUENT) 150 MG/ML SOAJ Inject 1 Syringe into the skin every 14 (fourteen) days. 05/04/20  Yes Adrian Prows, MD  aspirin EC 81 MG EC tablet Take 1 tablet (81 mg total) by mouth daily.  Swallow whole. 03/17/20  Yes Annita Brod, MD  budesonide-formoterol [facility]) 160-4.5 MCG/ACT inhaler Inhale 2 puffs into the lungs daily.    Yes [provider]  buPROPion (WELLBUTRIN XL) 150 MG 24 hr tablet Take 150 mg by mouth daily.   Yes [provider]  CALCIUM-VITAMIN D PO Take 1 tablet by mouth daily.   Yes [provider]  cetirizine (ZYRTEC) 10 MG tablet Take 10 mg by mouth daily.   Yes [provider]  doxazosin (CARDURA) 4 MG tablet Take 4 mg by mouth daily.    Yes [provider]  furosemide (LASIX) 40 MG tablet Take 40 mg by mouth daily. 02/19/20  Yes [provider]  Investigational - Study Medication Take 1 tablet by mouth daily. Study name: [place] stroke study Additional study details: [id number] bottle: 5, 15, 25 mg or placebo 03/16/20  Yes Annita Brod, MD  Multiple Vitamins-Minerals (MULTIVITAMIN PO) Take 1 tablet by mouth daily.   Yes [provider]  olmesartan (BENICAR) 40 MG tablet Take 40 mg by mouth every evening.   Yes [provider]  pantoprazole (PROTONIX) 40 MG tablet Take 1 tablet (40 mg total) by mouth 2 (two) times daily. Patient taking differently: Take 40 mg by mouth every evening.  10/16/16  Yes Nandigam, Venia Minks, MD  PARoxetine (PAXIL) 40 MG tablet Take 40 mg by mouth every evening.    Yes [provider]  potassium chloride SA (K-DUR) 20 MEQ tablet Take 20 mEq by mouth daily.    Yes [provider]  traZODone (DESYREL) 150 MG tablet Take 150 mg by mouth at bedtime.   Yes [provider]  vitamin B-12 (CYANOCOBALAMIN) 1000 MCG tablet Take 1,000 mcg by mouth daily.   Yes [provider]  docusate sodium (COLACE) 100  MG capsule Take 1 capsule (100 mg total) by mouth 2 (two) times daily. Patient not taking: Reported on 07/10/2020 09/06/17   Newman Pies, MD  ezetimibe (ZETIA) 10 MG tablet Take 1 tablet (10 mg total) by mouth daily after supper. Patient not taking: Reported on 04/26/2020 04/19/20 04/14/21  Adrian Prows, MD  icosapent Ethyl (VASCEPA) 1 g capsule Take 2 capsules (2 g total) by mouth 2 (two) times daily. Patient not taking: Reported on 07/10/2020 04/19/20   Adrian Prows, MD  Investigational - Study Medication Take 1 tablet by mouth daily. Study name: [place] stroke study Additional study details: [id number]/green bottle: 5, 15, 25 mg or placebo tablet (PI - [place]) Patient not taking: Reported on 08/13/2020 03/16/20   Annita Brod, MD  nitroGLYCERIN (NITROSTAT) 0.4 MG SL tablet Place 1 tablet (0.4 mg total) under the tongue every 5 (five) minutes as needed for chest pain. Patient not taking: Reported on 08/13/2020 03/15/19 08/13/20  Miquel Dunn, NP  simvastatin (ZOCOR) 20 MG tablet Take 1 tablet (20 mg total) by mouth as directed. 1 tablet Every other day Patient not taking: Reported on 04/26/2020 04/19/20   Adrian Prows, MD    Allergies    Amlodipine, Fenofibrate, Omeprazole, Simvastatin, Singulair [montelukast sodium], and Statins  Review of Systems   Review of Systems  Constitutional: Negative for chills and fever.  HENT: Negative for ear pain and sore throat.   Eyes: Negative for pain and visual disturbance.  Respiratory: Positive for shortness of breath. Negative for cough.   Cardiovascular: Positive for chest pain. Negative for palpitations.  Gastrointestinal: Negative for abdominal pain and vomiting.  Genitourinary: Negative for dysuria and hematuria.  Musculoskeletal: Positive for back pain. Negative for arthralgias.  Skin: Negative for color change and rash.  Neurological: Negative for seizures and syncope.  Psychiatric/Behavioral: Negative for confusion.  All other systems  reviewed and are negative.   Physical Exam Updated Vital Signs BP (!) 142/76   Pulse 74   Temp 97.7 F (36.5 C) (Oral)   Resp (!) 24   Ht 5\' 2"  (1.575 m)   Wt 85.3 kg   LMP  (LMP Unknown)   SpO2 92%   BMI 34.39 kg/m   Physical Exam Vitals and nursing note reviewed.  Constitutional:      General: She is not in acute distress.    Appearance: She is well-developed. She is not toxic-appearing or diaphoretic.  HENT:     Head: Normocephalic and atraumatic.  Eyes:     Conjunctiva/sclera: Conjunctivae normal.  Cardiovascular:     Rate and Rhythm: Normal rate and regular rhythm.     Pulses:          Radial pulses are 2+ on the right side and 2+ on the left side.       Dorsalis pedis pulses are 2+ on the right side and 2+ on the left side.     Heart sounds: No murmur heard.   Pulmonary:     Effort: Pulmonary effort is normal. No respiratory distress.     Breath sounds: Normal breath sounds. No decreased breath sounds or wheezing.  Abdominal:     Palpations: Abdomen is soft.     Tenderness: There is no abdominal tenderness.  Musculoskeletal:     Cervical back: Neck supple.     Right lower leg: No tenderness. Edema present.     Left lower leg: No tenderness. Edema present.     Comments: Trace edema to lower extremities bilaterally  Skin:    General: Skin is warm and dry.     Capillary Refill: Capillary refill takes less than 2 seconds.     Findings: No erythema or rash.  Neurological:     General: No focal deficit present.     Mental Status: She is alert.     ED Results / Procedures / Treatments   Labs (all labs ordered are listed, but only abnormal results are displayed) Labs Reviewed  BASIC METABOLIC PANEL - Abnormal; Notable for the following components:      Result Value   Creatinine, Ser 1.35 (*)    GFR, Estimated 42 (*)    All other components within normal limits  CBC - Abnormal; Notable for the following components:   MCH 25.3 (*)    RDW 15.8 (*)    All  other components within normal limits  RESPIRATORY PANEL BY RT PCR (FLU A&B, COVID)  BRAIN NATRIURETIC PEPTIDE  TROPONIN I (HIGH SENSITIVITY)  TROPONIN I (HIGH SENSITIVITY)    EKG EKG Interpretation  Date/Time:  Sunday August 13 2020 20:35:38 EST Ventricular Rate:  70 PR Interval:    QRS Duration: 92 QT Interval:  418 QTC Calculation: 451 R Axis:   15 Text Interpretation: Sinus rhythm Probable left atrial enlargement Low voltage, precordial leads Borderline T abnormalities, anterior leads since last tracing no significant change Confirmed by Malvin Johns [phone]) on 08/13/2020 8:48:52 PM   Radiology DG Chest 2 View  Result Date: 08/13/2020 CLINICAL DATA:  Chest pain EXAM: CHEST - 2 VIEW COMPARISON:  12/22/2013 FINDINGS: Surgical hardware in the cervical spine. No focal opacity or pleural effusion. Normal cardiomediastinal silhouette. No pneumothorax. IMPRESSION: No active cardiopulmonary  disease. Electronically Signed   By: Donavan Foil M.D.   On: 08/13/2020 17:18   CT Angio Chest/Abd/Pel for Dissection W and/or Wo Contrast  Result Date: 08/13/2020 CLINICAL DATA:  Acute chest and back pain EXAM: CT ANGIOGRAPHY CHEST, ABDOMEN AND PELVIS TECHNIQUE: Non-contrast CT of the chest was initially obtained. Multidetector CT imaging through the chest, abdomen and pelvis was performed using the standard protocol during bolus administration of intravenous contrast. Multiplanar reconstructed images and MIPs were obtained and reviewed to evaluate the vascular anatomy. CONTRAST:  70mL OMNIPAQUE IOHEXOL 350 MG/ML SOLN COMPARISON:  None. FINDINGS: CTA CHEST FINDINGS Cardiovascular: --Heart: The heart size is normal. Mild coronary artery calcifications are seen. There is nopericardial effusion. --Aorta: The course and caliber of the thoracic aorta are normal. There is scattered mild aortic atherosclerotic calcification. Precontrast images show no aortic intramural hematoma. There is no blood pool,  dissection or penetrating ulcer demonstrated on arterial phase postcontrast imaging. There is a conventional 3 vessel aortic arch branching pattern. The proximal arch vessels are widely patent. --Pulmonary Arteries: Contrast timing is optimized for preferential opacification of the aorta. Within that limitation, normal central pulmonary arteries. Mediastinum/Nodes: No mediastinal, hilar or axillary lymphadenopathy. The visualized thyroid and thoracic esophageal course are unremarkable. Lungs/Pleura: No pulmonary nodules or masses. No pleural effusion or pneumothorax. Mild bibasilar dependent atelectasis is noted. There is streaky atelectasis at the anterior lung base. No focal pleural abnormality. Musculoskeletal: No chest wall abnormality. No acute osseous findings. Review of the MIP images confirms the above findings. CTA ABDOMEN AND PELVIS FINDINGS VASCULAR Aorta: Normal caliber aorta without aneurysm, dissection, vasculitis or hemodynamically significant stenosis. There is moderate aortic atherosclerosis. Celiac: No aneurysm, dissection or hemodynamically significant stenosis. Normal branching pattern scattered mild atherosclerosis is seen. 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 Renals: Single renal arteries bilaterally. No aneurysm, dissection, stenosis or evidence of fibromuscular dysplasia. IMA: Patent without abnormality. Inflow: Scattered moderate atherosclerosis is seen. Veins: Normal course and caliber of the major veins. Assessment is otherwise limited by the arterial dominant contrast phase. Review of the MIP images confirms the above findings. NON-VASCULAR Hepatobiliary: Normal hepatic contours and density. No visible biliary dilatation. Normal gallbladder. Pancreas: Normal contours without ductal dilatation. No peripancreatic fluid collection. Spleen: Normal arterial phase splenic enhancement pattern. Adrenals/Urinary Tract: --Adrenal glands: Normal. --Right kidney/ureter:  A 2 cm hyperdense lesion is seen partially exophytic off the lower pole of the right kidney. --Left kidney/ureter: No hydronephrosis or perinephric stranding. No nephrolithiasis. No obstructing ureteral stones. --Urinary bladder: Unremarkable. Stomach/Bowel: --Stomach/Duodenum: No hiatal hernia or other gastric abnormality. Normal duodenal course and caliber. --Small bowel: No dilatation or inflammation. --Colon: No focal abnormality. --Appendix: Normal. Lymphatic:  No abdominal or pelvic lymphadenopathy. Reproductive: No free fluid in the pelvis. The patient is status post hysterectomy. No adnexal masses or collections seen. Musculoskeletal. No bony spinal canal stenosis or focal osseous abnormality. Lumbar fixation hardware seen at L5-S1 and partially visualized within the cervical spine. Other: None. Review of the MIP images confirms the above findings. IMPRESSION: 1. No acute aortic abnormality. 2. Mild to moderate aortic atherosclerosis. 3. 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 4. 2 cm slightly hyperdense lesion off the lower pole of the right kidney which could represent a hemorrhagic/proteinaceous cyst. If further evaluation is required would recommend dedicated renal ultrasound. 5.  Aortic Atherosclerosis (ICD10-I70.0). Electronically Signed   By: Prudencio Pair M.D.   On: 08/13/2020 20:24    Procedures Procedures (  including critical care time)  Medications Ordered in ED Medications  aspirin tablet 325 mg (325 mg Oral Given 08/13/20 2037)  nitroGLYCERIN (NITROSTAT) SL tablet 0.4 mg (has no administration in time range)  iohexol (OMNIPAQUE) 350 MG/ML injection 80 mL (80 mLs Intravenous Contrast Given 08/13/20 1936)    ED Course  I have reviewed the triage vital signs and the nursing notes.  Pertinent labs & imaging results that were available during my care of the patient were reviewed by me and considered in my medical decision making (see chart for details).    MDM  Rules/Calculators/A&P                          74 year old female with chest pain Presentation, she is alert, oriented, nontoxic-appearing, no acute distress, resting comfortably in ER bed.  She is speaking full sentences without increased work of breathing, nondiaphoretic.  Vitals on arrival with mild hypertension, however no other significant abnormalities noted.  Physical exam with trace edema to lower extremities bilaterally, abdomen soft and nontender, clear lung sounds, no reproducible chest wall or back tenderness to palpation.  DDx includes ACS, dissection being primarily the concern, GERD, stable versus unstable angina, pneumonia  Plan for basic labs, chest x-ray, EKG, troponins.  Per discussion with Dr. Tamera Punt who also saw and evaluated the patient, will order CTA of the chest abdomen and pelvis to rule out dissection.  Labs reviewed and interpreted by myself -CBC without leukocytosis, normal hemoglobin -BMP without any electrode abnormalities, creatinine of 1.35 which appears to be improved from prior. -BNP is normal -Delta troponin is normal  Imaging reviewed by myself and radiology -Chest x-ray without acute abnormalities -CTA of the chest abdomen pelvis shows a possible right proteinaceous/hemorrhagic cyst on the right kidney.  However patient is complaining of chest pain upper back pain, low suspicion that this is contributing to her symptoms.  EKG reviewed by myself and my supervising physician -Largely unchanged from previous, no evidence of ischemic changes  MDM: Patient was given nitroglycerin and aspirin.  On reevaluation, patient continues to have exertional chest pain.  No evidence of dissection at this time.  Heart score of 5 and the patient does have significant risk factors including CAD, hypertension tension, hyperlipidemia.  Consulted the hospitalist team to admit for possible further cardiology work-up and observation.  I discussed this with the patient and she is  agreeable.  Overall, remains hemodynamically stable here in the ED.  Patient was seen and evaluated by Dr. Tamera Punt who is agreeable to the above plan and disposition.  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

## 2020-08-13 NOTE — ED Triage Notes (Signed)
Pt arrives to ER with chief complaint of chest pain that began suddenly while she was walking around the grocery store around 3pm, she noticed a pain across her chest that radiated in her right sided of her jaw. The pain was intense was an hour and then decreased, is currently having pain in her right side of her upper back.

## 2020-08-13 NOTE — ED Notes (Signed)
Patient transported to X-ray 

## 2020-08-14 ENCOUNTER — Other Ambulatory Visit: Payer: Self-pay | Admitting: Cardiology

## 2020-08-14 ENCOUNTER — Telehealth (HOSPITAL_COMMUNITY): Payer: Self-pay | Admitting: *Deleted

## 2020-08-14 DIAGNOSIS — I25119 Atherosclerotic heart disease of native coronary artery with unspecified angina pectoris: Secondary | ICD-10-CM | POA: Diagnosis not present

## 2020-08-14 DIAGNOSIS — R0989 Other specified symptoms and signs involving the circulatory and respiratory systems: Secondary | ICD-10-CM

## 2020-08-14 DIAGNOSIS — I25118 Atherosclerotic heart disease of native coronary artery with other forms of angina pectoris: Secondary | ICD-10-CM

## 2020-08-14 DIAGNOSIS — Z8673 Personal history of transient ischemic attack (TIA), and cerebral infarction without residual deficits: Secondary | ICD-10-CM

## 2020-08-14 DIAGNOSIS — I2510 Atherosclerotic heart disease of native coronary artery without angina pectoris: Secondary | ICD-10-CM

## 2020-08-14 DIAGNOSIS — I208 Other forms of angina pectoris: Secondary | ICD-10-CM

## 2020-08-14 MED ORDER — NITROGLYCERIN 0.4 MG SL SUBL: 0.4000 mg | SUBLINGUAL_TABLET | SUBLINGUAL | 2 refills | Status: AC | PRN

## 2020-08-14 MED ORDER — ISOSORBIDE MONONITRATE ER 30 MG PO TB24
30.0000 mg | ORAL_TABLET | Freq: Every day | ORAL | 2 refills | Status: DC
Start: 2020-08-14 — End: 2020-09-05

## 2020-08-14 MED ORDER — NITROGLYCERIN 0.4 MG SL SUBL: 0.4000 mg | SUBLINGUAL_TABLET | SUBLINGUAL | 1 refills | Status: DC | PRN

## 2020-08-14 MED ORDER — ISOSORBIDE MONONITRATE ER 30 MG PO TB24
30.0000 mg | ORAL_TABLET | Freq: Every day | ORAL | 1 refills | Status: DC
Start: 2020-08-14 — End: 2020-08-14

## 2020-08-14 MED ORDER — ISOSORBIDE MONONITRATE ER 30 MG PO TB24: 30.0000 mg | ORAL_TABLET | Freq: Every day | ORAL | Status: DC

## 2020-08-14 MED ORDER — AEROCHAMBER PLUS FLO-VU LARGE MISC
1.0000 | Freq: Once | Status: AC
  Administered 2020-08-14: 1
  Filled 2020-08-14 (×2): qty 1

## 2020-08-14 NOTE — ED Notes (Signed)
Pt up to bathroom, repositioned in bed, given pillows. Denies any complaints or concerns at this time.

## 2020-08-14 NOTE — Consult Note (Signed)
CARDIOLOGY CONSULT NOTE  Patient ID: Shelley Spencer MRN: [id number] DOB/AGE: 29-Aug-1946 74 y.o.  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   Patient was walking in [place] on 11/21 doing thanksgiving shopping, is when she had retrosternal chest heaviness, radiating to her left jaw and back, improved with rest. She had similar episode a week before while shopping at [employer]. She performs pulmonary rehab, last was two weeks ago, which involves 15 min of elliptical and bicycle. She has not had any pain during rehab. However, patient has had similar pain in the past during activity. She had CTA in 03/2019 that showed mod CAD with negative CTA FFR. She has been on Praluent due to statin intolerance. She is on o;mesartan for hypertension, but not on any anti agtinal medications at baseline.   Workup in the ED showed trop HS negative X2, EKG without any ischemic changes. She also underwent CTA that did not show dissection. There were incidental findings of renal pole ?cyst and mild SMA stenosis.   Past Medical History:  Diagnosis Date  . Anemia   . Anxiety   . Arthritis   . Asthma   . Bicornuate uterus    double cervix  . CAD (coronary artery disease)   . Colon polyp   . Depression   . Deviated septum   . DJD (degenerative joint disease)    L-Spine  . Fibroids   . GERD (gastroesophageal reflux disease)   . Hematuria   . Hiatal hernia   . HNP (herniated nucleus pulposus)    lower back  . Hyperlipidemia   . Hypertension   . MVP (mitral valve prolapse)    pt denies, pt states she had a Echo ?2008 and it did not show MVP  . Pre-diabetes   . Retinal tear 2005   Small right retinal tear  . Shingles 1980's   on her waist      Past Surgical History:  Procedure Laterality Date  . ANTERIOR CERVICAL DECOMP/DISCECTOMY FUSION N/A 09/05/2017   Procedure: Cervical four-five, Cervical five-six, Cervical six-seven Anterior cervical decompression/discectomy/fusion;  Surgeon: Erline Levine, MD;  Location: [facility];  Service: Neurosurgery;  Laterality: N/A;  . bone spur surgery Bilateral    x 3  . CARDIOVASCULAR STRESS TEST  02/08/2014  . COLONOSCOPY    . CYST EXCISION Left 09/29/2018   Procedure: LEFT MIDDLE FINGER CYST REMOVAL AND DEBRIDEMENT OF DISTAL INTERPHALANGEAL JOINT;  Surgeon: Daryll Brod, MD;  Location: [facility];  Service: Orthopedics;  Laterality: Left;  . FINGER SURGERY Left    left third finger mucoid cyst removed  . FOOT NEUROMA SURGERY Bilateral   . HYSTEROSCOPY     D&[facility] PMB Endo Cx Polyp  . KNEE ARTHROSCOPY WITH MENISCAL REPAIR Right 2016   --Flossie Dibble, MD  . LAPAROSCOPIC HYSTERECTOMY  7/209/10   R-TLH/BSO  Fibroids, BAck Pain, Adenomyosis  . LUMBAR FUSION    . removal vaginal septum    . TOTAL KNEE ARTHROPLASTY Right 05/17/2019   Procedure: TOTAL KNEE ARTHROPLASTY;  Surgeon: Gaynelle Arabian, MD;  Location: [facility];  Service: Orthopedics;  Laterality: Right;  52min  . TUBAL LIGATION        Family History  Problem Relation Age of Onset  . Heart failure Father  Possibly related to EtOH  . Alcohol abuse Father   . Emphysema Father   . Emphysema Mother   . Heart failure Mother        Possibly related to EtOH  . Diabetes Mother   . Alcohol abuse Mother   . Valvular heart disease Brother   . Bipolar disorder Brother   . Crohn's disease Daughter   . Thyroid disease Daughter   . Colon cancer Neg Hx   . Stomach cancer Neg Hx   . Esophageal cancer Neg Hx   . Rectal cancer Neg Hx   . Liver cancer Neg Hx   . Stroke Neg Hx      Social History: Social History   Socioeconomic History  . Marital status: Married    Spouse name: Not on file  . Number of children: 2  . Years  of education: Not on file  . Highest education level: Not on file  Occupational History  . Occupation: Editor, commissioning  Tobacco Use  . Smoking status: Former Smoker    Packs/day: 1.00    Years: 20.00    Pack years: 20.00    Types: Cigarettes    Quit date: 09/24/1979    Years since quitting: 40.9  . Smokeless tobacco: Never Used  Vaping Use  . Vaping Use: Never used  Substance and Sexual Activity  . Alcohol use: Not Currently    Alcohol/week: 1.0 standard drink    Types: 1 Standard drinks or equivalent per week  . Drug use: No  . Sexual activity: Yes    Partners: Male    Birth control/protection: Surgical    Comment: R-TLH/BSO  Other Topics Concern  . Not on file  Social History Narrative   Husband with heart transplant.  Lives with husband and son.   Social Determinants of Health   Financial Resource Strain:   . Difficulty of Paying Living Expenses: Not on file  Food Insecurity:   . Worried About Charity fundraiser in the Last Year: Not on file  . Ran Out of Food in the Last Year: Not on file  Transportation Needs:   . Lack of Transportation (Medical): Not on file  . Lack of Transportation (Non-Medical): Not on file  Physical Activity:   . Days of Exercise per Week: Not on file  . Minutes of Exercise per Session: Not on file  Stress:   . Feeling of Stress : Not on file  Social Connections:   . Frequency of Communication with Friends and Family: Not on file  . Frequency of Social Gatherings with Friends and Family: Not on file  . Attends Religious Services: Not on file  . Active Member of Clubs or Organizations: Not on file  . Attends Archivist Meetings: Not on file  . Marital Status: Not on file  Intimate Partner Violence:   . Fear of Current or Ex-Partner: Not on file  . Emotionally Abused: Not on file  . Physically Abused: Not on file  . Sexually Abused: Not on file     (Not in a hospital admission)   Review of Systems  Constitutional: Negative for  decreased appetite, malaise/fatigue, weight gain and weight loss.  HENT: Negative for congestion.   Eyes: Negative for visual disturbance.  Cardiovascular: Positive for chest pain and dyspnea on exertion. Negative for leg swelling, palpitations and syncope.  Respiratory: Negative for cough.   Endocrine: Negative for cold intolerance.  Hematologic/Lymphatic: Does not bruise/bleed easily.  Skin: Negative for itching and  rash.  Musculoskeletal: Negative for myalgias.  Gastrointestinal: Negative for abdominal pain, nausea and vomiting.  Genitourinary: Negative for dysuria.  Neurological: Negative for dizziness and weakness.  Psychiatric/Behavioral: The patient is not nervous/anxious.   All other systems reviewed and are negative.     Physical Exam: Physical Exam Vitals and nursing note reviewed.  Constitutional:      General: She is not in acute distress.    Appearance: She is well-developed.  HENT:     Head: Normocephalic and atraumatic.  Eyes:     Conjunctiva/sclera: Conjunctivae normal.     Pupils: Pupils are equal, round, and reactive to light.  Neck:     Vascular: No JVD.  Cardiovascular:     Rate and Rhythm: Normal rate and regular rhythm.     Pulses: Normal pulses and intact distal pulses.          Carotid pulses are on the right side with bruit.    Heart sounds: No murmur heard.   Pulmonary:     Effort: Pulmonary effort is normal.     Breath sounds: Normal breath sounds. No wheezing or rales.  Abdominal:     General: Bowel sounds are normal.     Palpations: Abdomen is soft.     Tenderness: There is no rebound.  Musculoskeletal:        General: No tenderness. Normal range of motion.     Right lower leg: No edema.     Left lower leg: No edema.  Lymphadenopathy:     Cervical: No cervical adenopathy.  Skin:    General: Skin is warm and dry.  Neurological:     Mental Status: She is alert and oriented to person, place, and time.     Cranial Nerves: No cranial nerve  deficit.      Labs:   Lab Results  Component Value Date   WBC 7.4 08/13/2020   HGB 12.4 08/13/2020   HCT 40.4 08/13/2020   MCV 82.3 08/13/2020   PLT 252 08/13/2020    Recent Labs  Lab 08/13/20 1711  NA 142  K 3.7  CL 103  CO2 29  BUN 19  CREATININE 1.35*  CALCIUM 9.3  GLUCOSE 91    Lipid Panel     Component Value Date/Time   CHOL 160 03/15/2020 0803   CHOL 211 (H) 12/28/2019 1452   TRIG 101 03/15/2020 0803   HDL 63 03/15/2020 0803   HDL 69 12/28/2019 1452   CHOLHDL 2.5 03/15/2020 0803   VLDL 20 03/15/2020 0803   LDLCALC 77 03/15/2020 0803   LDLCALC 95 12/28/2019 1452    BNP (last 3 results) Recent Labs    08/13/20 1828  BNP 66.1    HEMOGLOBIN A1C Lab Results  Component Value Date   HGBA1C 6.1 (H) 03/15/2020   MPG 128.37 03/15/2020    Cardiac Panel (last 3 results) No results for input(s): CKTOTAL, CKMB, RELINDX in the last 8760 hours.  Invalid input(s): TROPONINHS  No results found for: CKTOTAL, CKMB, CKMBINDEX   TSH Recent Labs    03/15/20 0853  TSH 2.500      Radiology: DG Chest 2 View  Result Date: 08/13/2020 CLINICAL DATA:  Chest pain EXAM: CHEST - 2 VIEW COMPARISON:  12/22/2013 FINDINGS: Surgical hardware in the cervical spine. No focal opacity or pleural effusion. Normal cardiomediastinal silhouette. No pneumothorax. IMPRESSION: No active cardiopulmonary disease. Electronically Signed   By: Donavan Foil M.D.   On: 08/13/2020 17:18   CT Angio Chest/Abd/Pel for Dissection  W and/or Wo Contrast  Result Date: 08/13/2020 CLINICAL DATA:  Acute chest and back pain EXAM: CT ANGIOGRAPHY CHEST, ABDOMEN AND PELVIS TECHNIQUE: Non-contrast CT of the chest was initially obtained. Multidetector CT imaging through the chest, abdomen and pelvis was performed using the standard protocol during bolus administration of intravenous contrast. Multiplanar reconstructed images and MIPs were obtained and reviewed to evaluate the vascular anatomy. CONTRAST:   61mL OMNIPAQUE IOHEXOL 350 MG/ML SOLN COMPARISON:  None. FINDINGS: CTA CHEST FINDINGS Cardiovascular: --Heart: The heart size is normal. Mild coronary artery calcifications are seen. There is nopericardial effusion. --Aorta: The course and caliber of the thoracic aorta are normal. There is scattered mild aortic atherosclerotic calcification. Precontrast images show no aortic intramural hematoma. There is no blood pool, dissection or penetrating ulcer demonstrated on arterial phase postcontrast imaging. There is a conventional 3 vessel aortic arch branching pattern. The proximal arch vessels are widely patent. --Pulmonary Arteries: Contrast timing is optimized for preferential opacification of the aorta. Within that limitation, normal central pulmonary arteries. Mediastinum/Nodes: No mediastinal, hilar or axillary lymphadenopathy. The visualized thyroid and thoracic esophageal course are unremarkable. Lungs/Pleura: No pulmonary nodules or masses. No pleural effusion or pneumothorax. Mild bibasilar dependent atelectasis is noted. There is streaky atelectasis at the anterior lung base. No focal pleural abnormality. Musculoskeletal: No chest wall abnormality. No acute osseous findings. Review of the MIP images confirms the above findings. CTA ABDOMEN AND PELVIS FINDINGS VASCULAR Aorta: Normal caliber aorta without aneurysm, dissection, vasculitis or hemodynamically significant stenosis. There is moderate aortic atherosclerosis. Celiac: No aneurysm, dissection or hemodynamically significant stenosis. Normal branching pattern scattered mild atherosclerosis is seen. 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 Renals: Single renal arteries bilaterally. No aneurysm, dissection, stenosis or evidence of fibromuscular dysplasia. IMA: Patent without abnormality. Inflow: Scattered moderate atherosclerosis is seen. Veins: Normal course and caliber of the major veins. Assessment is otherwise limited by the  arterial dominant contrast phase. Review of the MIP images confirms the above findings. NON-VASCULAR Hepatobiliary: Normal hepatic contours and density. No visible biliary dilatation. Normal gallbladder. Pancreas: Normal contours without ductal dilatation. No peripancreatic fluid collection. Spleen: Normal arterial phase splenic enhancement pattern. Adrenals/Urinary Tract: --Adrenal glands: Normal. --Right kidney/ureter: A 2 cm hyperdense lesion is seen partially exophytic off the lower pole of the right kidney. --Left kidney/ureter: No hydronephrosis or perinephric stranding. No nephrolithiasis. No obstructing ureteral stones. --Urinary bladder: Unremarkable. Stomach/Bowel: --Stomach/Duodenum: No hiatal hernia or other gastric abnormality. Normal duodenal course and caliber. --Small bowel: No dilatation or inflammation. --Colon: No focal abnormality. --Appendix: Normal. Lymphatic:  No abdominal or pelvic lymphadenopathy. Reproductive: No free fluid in the pelvis. The patient is status post hysterectomy. No adnexal masses or collections seen. Musculoskeletal. No bony spinal canal stenosis or focal osseous abnormality. Lumbar fixation hardware seen at L5-S1 and partially visualized within the cervical spine. Other: None. Review of the MIP images confirms the above findings. IMPRESSION: 1. No acute aortic abnormality. 2. Mild to moderate aortic atherosclerosis. 3. 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 4. 2 cm slightly hyperdense lesion off the lower pole of the right kidney which could represent a hemorrhagic/proteinaceous cyst. If further evaluation is required would recommend dedicated renal ultrasound. 5.  Aortic Atherosclerosis (ICD10-I70.0). Electronically Signed   By: Prudencio Pair M.D.   On: 08/13/2020 20:24    Scheduled Meds: . AeroChamber Plus Flo-Vu Large  1 each Other Once  . aspirin EC  81 mg Oral Daily  . aspirin  325 mg Oral Once  . buPROPion  150 mg Oral Daily  .  doxazosin  4 mg Oral Daily  . enoxaparin (LOVENOX) injection  40 mg Subcutaneous Daily  . furosemide  40 mg Oral Daily  . irbesartan  37.5 mg Oral Daily  . mometasone-formoterol  2 puff Inhalation BID  . pantoprazole  40 mg Oral QPM  . PARoxetine  40 mg Oral QPM  . potassium chloride SA  20 mEq Oral Daily  . traZODone  150 mg Oral QHS   Continuous Infusions: PRN Meds:.acetaminophen, albuterol, nitroGLYCERIN, ondansetron (ZOFRAN) IV  CARDIAC STUDIES:  EKG 08/13/2020: Sinus rhythm 73 bpm No acute ischemic changes  Echocardiogram 03/15/2020: 1. Technically difficult echo with poor image quality.  2. Left ventricular ejection fraction, by estimation, is 65 to 70%. The  left ventricle has normal function. The left ventricle has no regional  wall motion abnormalities. Left ventricular diastolic parameters are  consistent with Grade I diastolic  dysfunction (impaired relaxation).  3. Right ventricular systolic function is normal. The right ventricular  size is normal.  4. The mitral valve is grossly normal. No evidence of mitral valve  regurgitation.  5. The aortic valve is grossly normal. Aortic valve regurgitation is not  visualized. No aortic stenosis is present.  6. The inferior vena cava is normal in size with greater than 50%  respiratory variability, suggesting right atrial pressure of 3 mmHg.   CTA 03/30/2019: 1. Coronary calcium score of 33. This was 57 percentile for age and sex matched control. 2. Normal coronary origin with left dominance. 3. Moderate plaque with stenosis 50-69% in the mid LAD. CAD RADS 3. 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   Chest pain: Stable angina, by history. No EKG, trop changes to suggest ACS. Not on any anti anginal  medications at baseline. Would avoid beta blockers given her h/o asthma Start Imdur 30 mg daily, with SL NTG for prn use Continue aspirin, Praluent. Will arrange outpatient exercise nuclear stress test and close follow up.  Hypertension: Continue olmesartan  Carotid bruit: Will arrange outpatient carotid US. No significant stenosis seen on MRA in 02/2020.  From cardiac standpoint, okay to discharge home with close outpatient follow up.     Nigel Mormon, MD Pager: [phone] Office: [phone]

## 2020-08-14 NOTE — Progress Notes (Signed)
.itpsPulmonary Individual Treatment Plan  Patient Details  Name: ROUX BRANDY MRN: [id number] Date of Birth: October 30, 1945 Referring Provider:     Pulmonary Rehab Walk Test from 07/10/2020 in [facility]  Referring Provider Dr. Letta Median      Initial Encounter Date:    Pulmonary Rehab Walk Test from 07/10/2020 in [facility]  Date 07/10/20      Visit Diagnosis: Uncomplicated asthma, unspecified asthma severity, unspecified whether persistent  Patient's Home Medications on Admission:   Current Outpatient Medications:  .  acetaminophen (TYLENOL) 650 MG CR tablet, Take 1,300 mg by mouth every 8 (eight) hours as needed for pain. , Disp: , Rfl:  .  albuterol (VENTOLIN HFA) 108 (90 Base) MCG/ACT inhaler, Inhale 1-2 puffs into the lungs every 6 (six) hours as needed for wheezing or shortness of breath. , Disp: , Rfl:  .  Alirocumab (PRALUENT) 150 MG/ML SOAJ, Inject 1 Syringe into the skin every 14 (fourteen) days., Disp: 6 mL, Rfl: 3 .  aspirin EC 81 MG EC tablet, Take 1 tablet (81 mg total) by mouth daily. Swallow whole., Disp: 30 tablet, Rfl: 11 .  budesonide-formoterol (SYMBICORT) 160-4.5 MCG/ACT inhaler, Inhale 2 puffs into the lungs daily. , Disp: , Rfl:  .  buPROPion (WELLBUTRIN XL) 150 MG 24 hr tablet, Take 150 mg by mouth daily., Disp: , Rfl:  .  CALCIUM-VITAMIN D PO, Take 1 tablet by mouth daily., Disp: , Rfl:  .  cetirizine (ZYRTEC) 10 MG tablet, Take 10 mg by mouth daily., Disp: , Rfl:  .  docusate sodium (COLACE) 100 MG capsule, Take 1 capsule (100 mg total) by mouth 2 (two) times daily. (Patient not taking: Reported on 07/10/2020), Disp: 60 capsule, Rfl: 0 .  doxazosin (CARDURA) 4 MG tablet, Take 4 mg by mouth daily. , Disp: , Rfl:  .  ezetimibe (ZETIA) 10 MG tablet, Take 1 tablet (10 mg total) by mouth daily after supper. (Patient not taking: Reported on 04/26/2020), Disp: 90 tablet, Rfl: 3 .  furosemide (LASIX) 40 MG  tablet, Take 40 mg by mouth daily., Disp: , Rfl:  .  icosapent Ethyl (VASCEPA) 1 g capsule, Take 2 capsules (2 g total) by mouth 2 (two) times daily. (Patient not taking: Reported on 07/10/2020), Disp: 120 capsule, Rfl: 6 .  Investigational - Study Medication, Take 1 tablet by mouth daily. Study name: [place] stroke study Additional study details: [id number] bottle: 5, 15, 25 mg or placebo, Disp: 1 each, Rfl: PRN .  Investigational - Study Medication, Take 1 tablet by mouth daily. Study name: [place] stroke study Additional study details: [id number]/green bottle: 5, 15, 25 mg or placebo tablet (PI - Sethi) (Patient not taking: Reported on 08/13/2020), Disp: 1 each, Rfl: PRN .  isosorbide mononitrate (IMDUR) 30 MG 24 hr tablet, Take 1 tablet (30 mg total) by mouth daily., Disp: 30 tablet, Rfl: 2 .  Multiple Vitamins-Minerals (MULTIVITAMIN PO), Take 1 tablet by mouth daily., Disp: , Rfl:  .  nitroGLYCERIN (NITROSTAT) 0.4 MG SL tablet, Place 1 tablet (0.4 mg total) under the tongue every 5 (five) minutes as needed for chest pain., Disp: 30 tablet, Rfl: 2 .  olmesartan (BENICAR) 40 MG tablet, Take 40 mg by mouth every evening., Disp: , Rfl:  .  pantoprazole (PROTONIX) 40 MG tablet, Take 1 tablet (40 mg total) by mouth 2 (two) times daily. (Patient taking differently: Take 40 mg by mouth every evening. ), Disp: 180 tablet, Rfl: 3 .  PARoxetine (PAXIL) 40 MG tablet, Take 40 mg by mouth every evening. , Disp: , Rfl:  .  potassium chloride SA (K-DUR) 20 MEQ tablet, Take 20 mEq by mouth daily. , Disp: , Rfl:  .  simvastatin (ZOCOR) 20 MG tablet, Take 1 tablet (20 mg total) by mouth as directed. 1 tablet Every other day (Patient not taking: Reported on 04/26/2020), Disp: 90 tablet, Rfl: 0 .  traZODone (DESYREL) 150 MG tablet, Take 150 mg by mouth at bedtime., Disp: , Rfl:  .  vitamin B-12 (CYANOCOBALAMIN) 1000 MCG tablet, Take 1,000 mcg by mouth daily., Disp: , Rfl:  No current facility-administered  medications for this encounter.  Facility-Administered Medications Ordered in Other Encounters:  .  acetaminophen (TYLENOL) tablet 650 mg, 650 mg, Oral, Q4H PRN, Marcelyn Bruins, MD .  albuterol (VENTOLIN HFA) 108 (90 Base) MCG/ACT inhaler 1-2 puff, 1-2 puff, Inhalation, Q6H PRN, Marcelyn Bruins, MD .  aspirin EC tablet 81 mg, 81 mg, Oral, Daily, Marcelyn Bruins, MD, 81 mg at 08/14/20 1000 .  aspirin tablet 325 mg, 325 mg, Oral, Once, Marcelyn Bruins, MD .  buPROPion (WELLBUTRIN XL) 24 hr tablet 150 mg, 150 mg, Oral, Daily, Marcelyn Bruins, MD, 150 mg at 08/14/20 1000 .  doxazosin (CARDURA) tablet 4 mg, 4 mg, Oral, Daily, Marcelyn Bruins, MD, 4 mg at 08/14/20 1001 .  enoxaparin (LOVENOX) injection 40 mg, 40 mg, Subcutaneous, Daily, Marcelyn Bruins, MD, 40 mg at 08/14/20 1000 .  furosemide (LASIX) tablet 40 mg, 40 mg, Oral, Daily, Marcelyn Bruins, MD, 40 mg at 08/14/20 1000 .  irbesartan (AVAPRO) tablet 37.5 mg, 37.5 mg, Oral, Daily, Neva Seat B, MD .  isosorbide mononitrate (IMDUR) 24 hr tablet 30 mg, 30 mg, Oral, Daily, Patwardhan, Manish J, MD .  mometasone-formoterol (DULERA) 200-5 MCG/ACT inhaler 2 puff, 2 puff, Inhalation, BID, Marcelyn Bruins, MD, 2 puff at 08/14/20 [phone] .  nitroGLYCERIN (NITROSTAT) SL tablet 0.4 mg, 0.4 mg, Sublingual, Q5 min PRN, Marcelyn Bruins, MD .  ondansetron [facility]) injection 4 mg, 4 mg, Intravenous, Q6H PRN, Marcelyn Bruins, MD .  pantoprazole (PROTONIX) EC tablet 40 mg, 40 mg, Oral, QPM, Marcelyn Bruins, MD, 40 mg at 08/13/20 2311 .  PARoxetine (PAXIL) tablet 40 mg, 40 mg, Oral, QPM, Marcelyn Bruins, MD, 40 mg at 08/13/20 2311 .  potassium chloride SA (KLOR-CON) CR tablet 20 mEq, 20 mEq, Oral, Daily, Marcelyn Bruins, MD, 20 mEq at 08/14/20 1000 .  traZODone (DESYREL) tablet 150 mg, 150 mg, Oral, QHS, Marcelyn Bruins, MD, 150 mg at 08/13/20 2310  Past Medical History: Past Medical History:    Diagnosis Date  . Anemia   . Anxiety   . Arthritis   . Asthma   . Bicornuate uterus    double cervix  . CAD (coronary artery disease)   . Colon polyp   . Depression   . Deviated septum   . DJD (degenerative joint disease)    L-Spine  . Fibroids   . GERD (gastroesophageal reflux disease)   . Hematuria   . Hiatal hernia   . HNP (herniated nucleus pulposus)    lower back  . Hyperlipidemia   . Hypertension   . MVP (mitral valve prolapse)    pt denies, pt states she had a Echo ?2008 and it did not show MVP  . Pre-diabetes   . Retinal tear 2005   Small right retinal tear  . Shingles 1980's  on her waist    Tobacco Use: Social History   Tobacco Use  Smoking Status Former Smoker  . Packs/day: 1.00  . Years: 20.00  . Pack years: 20.00  . Types: Cigarettes  . Quit date: 09/24/1979  . Years since quitting: 40.9  Smokeless Tobacco Never Used    Labs: Recent Chemical engineer    Labs for ITP Cardiac and Pulmonary Rehab Latest Ref Rng & Units 08/24/2008 09/02/2017 12/28/2019 03/15/2020   Cholestrol 0 - 200 mg/dL - - 211(H) 160   LDLCALC 0 - 99 mg/dL - - 95 77   HDL >40 mg/dL - - 69 63   Trlycerides <150 mg/dL - - 282(H) 101   Hemoglobin A1c 4.8 - 5.6 % - 5.7(H) - 6.1(H)   TCO2 0 - 100 mmol/L 27 - - -      Capillary Blood Glucose: Lab Results  Component Value Date   GLUCAP 96 03/16/2020   GLUCAP 83 03/16/2020   GLUCAP 95 03/16/2020   GLUCAP 146 (H) 03/15/2020   GLUCAP 86 03/15/2020     Pulmonary Assessment Scores:  Pulmonary Assessment Scores    Row Name 07/10/20 1630         ADL UCSD   ADL Phase Entry     SOB Score total 46       CAT Score   CAT Score 17       mMRC Score   mMRC Score 3           UCSD: Self-administered rating of dyspnea associated with activities of daily living (ADLs) 6-point scale (0 = "not at all" to 5 = "maximal or unable to do because of breathlessness")  Scoring Scores range from 0 to 120.  Minimally important  difference is 5 units  CAT: CAT can identify the health impairment of COPD patients and is better correlated with disease progression.  CAT has a scoring range of zero to 40. The CAT score is classified into four groups of low (less than 10), medium (10 - 20), high (21-30) and very high (31-40) based on the impact level of disease on health status. A CAT score over 10 suggests significant symptoms.  A worsening CAT score could be explained by an exacerbation, poor medication adherence, poor inhaler technique, or progression of COPD or comorbid conditions.  CAT MCID is 2 points  mMRC: mMRC (Modified Medical Research Council) Dyspnea Scale is used to assess the degree of baseline functional disability in patients of respiratory disease due to dyspnea. No minimal important difference is established. A decrease in score of 1 point or greater is considered a positive change.   Pulmonary Function Assessment:   Exercise Target Goals: Exercise Program Goal: Individual exercise prescription set using results from initial 6 min walk test and THRR while considering  patient's activity barriers and safety.   Exercise Prescription Goal: Initial exercise prescription builds to 30-45 minutes a day of aerobic activity, 2-3 days per week.  Home exercise guidelines will be given to patient during program as part of exercise prescription that the participant will acknowledge.  Activity Barriers & Risk Stratification:  Activity Barriers & Cardiac Risk Stratification - 07/10/20 1343      Activity Barriers & Cardiac Risk Stratification   Activity Barriers Arthritis;Back Problems;Balance Concerns;History of Falls;Shortness of Breath;Joint Problems;Deconditioning           6 Minute Walk:  6 Minute Walk    Row Name 07/10/20 1622         6  Minute Walk   Phase Initial     Distance 1188 feet     Walk Time 6 minutes     # of Rest Breaks 1  stopped at 5 minutes and 40 seconds due to hip pain scale 5/10       MPH 2.25     METS 2.22     RPE 13     Perceived Dyspnea  2     VO2 Peak 7.76     Symptoms Yes (comment)     Comments Hip pain 5/10     Resting HR 84 bpm     Resting BP 110/56     Resting Oxygen Saturation  94 %     Exercise Oxygen Saturation  during 6 min walk 82 %     Max Ex. HR 113 bpm     Max Ex. BP 120/68     2 Minute Post BP 110/60       Interval HR   1 Minute HR 95     2 Minute HR 92     3 Minute HR 111     4 Minute HR 111     5 Minute HR 113     6 Minute HR 102     2 Minute Post HR 93     Interval Heart Rate? Yes       Interval Oxygen   Interval Oxygen? Yes     Baseline Oxygen Saturation % 94 %     1 Minute Oxygen Saturation % 86 %     1 Minute Liters of Oxygen 0 L     2 Minute Oxygen Saturation % 82 %     2 Minute Liters of Oxygen 0 L     3 Minute Oxygen Saturation % 97 %     3 Minute Liters of Oxygen 0 L     4 Minute Oxygen Saturation % 97 %     4 Minute Liters of Oxygen 0 L     5 Minute Oxygen Saturation % 97 %     5 Minute Liters of Oxygen 0 L     6 Minute Oxygen Saturation % 98 %     6 Minute Liters of Oxygen 0 L     2 Minute Post Oxygen Saturation % 99 %     2 Minute Post Liters of Oxygen 0 L            Oxygen Initial Assessment:  Oxygen Initial Assessment - 07/10/20 1620      Home Oxygen   Home Oxygen Device None    Sleep Oxygen Prescription None    Home Exercise Oxygen Prescription None    Home Resting Oxygen Prescription None      Initial 6 min Walk   Oxygen Used None      Program Oxygen Prescription   Program Oxygen Prescription None      Intervention   Short Term Goals To learn and exhibit compliance with exercise, home and travel O2 prescription;To learn and understand importance of maintaining oxygen saturations>88%;To learn and demonstrate proper use of respiratory medications;To learn and understand importance of monitoring SPO2 with pulse oximeter and demonstrate accurate use of the pulse oximeter.;To learn and demonstrate  proper pursed lip breathing techniques or other breathing techniques.    Long  Term Goals Exhibits compliance with exercise, home and travel O2 prescription;Verbalizes importance of monitoring SPO2 with pulse oximeter and return demonstration;Maintenance of O2 saturations>88%;Exhibits proper breathing techniques, such as pursed lip  breathing or other method taught during program session;Compliance with respiratory medication;Demonstrates proper use of MDI's           Oxygen Re-Evaluation:  Oxygen Re-Evaluation    Row Name 07/20/20 0815 08/10/20 0813           Program Oxygen Prescription   Program Oxygen Prescription None None        Home Oxygen   Home Oxygen Device None None      Sleep Oxygen Prescription None None      Home Exercise Oxygen Prescription None None      Home Resting Oxygen Prescription None None        Goals/Expected Outcomes   Short Term Goals To learn and exhibit compliance with exercise, home and travel O2 prescription;To learn and understand importance of maintaining oxygen saturations>88%;To learn and demonstrate proper use of respiratory medications;To learn and understand importance of monitoring SPO2 with pulse oximeter and demonstrate accurate use of the pulse oximeter.;To learn and demonstrate proper pursed lip breathing techniques or other breathing techniques. To learn and exhibit compliance with exercise, home and travel O2 prescription;To learn and understand importance of maintaining oxygen saturations>88%;To learn and demonstrate proper use of respiratory medications;To learn and understand importance of monitoring SPO2 with pulse oximeter and demonstrate accurate use of the pulse oximeter.;To learn and demonstrate proper pursed lip breathing techniques or other breathing techniques.      Long  Term Goals Exhibits compliance with exercise, home and travel O2 prescription;Verbalizes importance of monitoring SPO2 with pulse oximeter and return  demonstration;Maintenance of O2 saturations>88%;Exhibits proper breathing techniques, such as pursed lip breathing or other method taught during program session;Compliance with respiratory medication;Demonstrates proper use of MDI's Exhibits compliance with exercise, home and travel O2 prescription;Verbalizes importance of monitoring SPO2 with pulse oximeter and return demonstration;Maintenance of O2 saturations>88%;Exhibits proper breathing techniques, such as pursed lip breathing or other method taught during program session;Compliance with respiratory medication;Demonstrates proper use of MDI's      Goals/Expected Outcomes compliance and understanding of oxygen saturation and pursed lip breathing compliance and understanding of oxygen saturation and pursed lip breathing             Oxygen Discharge (Final Oxygen Re-Evaluation):  Oxygen Re-Evaluation - 08/10/20 0813      Program Oxygen Prescription   Program Oxygen Prescription None      Home Oxygen   Home Oxygen Device None    Sleep Oxygen Prescription None    Home Exercise Oxygen Prescription None    Home Resting Oxygen Prescription None      Goals/Expected Outcomes   Short Term Goals To learn and exhibit compliance with exercise, home and travel O2 prescription;To learn and understand importance of maintaining oxygen saturations>88%;To learn and demonstrate proper use of respiratory medications;To learn and understand importance of monitoring SPO2 with pulse oximeter and demonstrate accurate use of the pulse oximeter.;To learn and demonstrate proper pursed lip breathing techniques or other breathing techniques.    Long  Term Goals Exhibits compliance with exercise, home and travel O2 prescription;Verbalizes importance of monitoring SPO2 with pulse oximeter and return demonstration;Maintenance of O2 saturations>88%;Exhibits proper breathing techniques, such as pursed lip breathing or other method taught during program session;Compliance  with respiratory medication;Demonstrates proper use of MDI's    Goals/Expected Outcomes compliance and understanding of oxygen saturation and pursed lip breathing           Initial Exercise Prescription:  Initial Exercise Prescription - 07/13/20 0700      Date of Initial  Exercise RX and Referring Provider   Date 07/10/20    Referring Provider Dr. Letta Median    Expected Discharge Date 09/14/20      Recumbant Bike   Level 1.5    Minutes 15      NuStep   Level 2    Minutes 15      Prescription Details   Frequency (times per week) 2    Duration Progress to 30 minutes of continuous aerobic without signs/symptoms of physical distress      Intensity   THRR 40-80% of Max Heartrate 59-118    Ratings of Perceived Exertion 11-13    Perceived Dyspnea 0-4      Progression   Progression Continue to progress workloads to maintain intensity without signs/symptoms of physical distress.      Resistance Training   Training Prescription Yes    Weight orange bands    Reps 10-15           Perform Capillary Blood Glucose checks as needed.  Exercise Prescription Changes:  Exercise Prescription Changes    Row Name 07/20/20 0800 07/25/20 1400 08/01/20 0809         Response to Exercise   Blood Pressure (Admit) 140/72 132/70 140/72     Blood Pressure (Exercise) 140/72 122/70 --     Blood Pressure (Exit) 114/68 114/66 134/61     Heart Rate (Admit) 83 bpm 85 bpm 98 bpm     Heart Rate (Exercise) 107 bpm 120 bpm 110 bpm     Heart Rate (Exit) 96 bpm 85 bpm 109 bpm     Oxygen Saturation (Admit) 97 % 97 % 96 %     Oxygen Saturation (Exercise) 97 % 88 % 95 %     Oxygen Saturation (Exit) 96 % 97 % 96 %     Rating of Perceived Exertion (Exercise) 13 13 13      Perceived Dyspnea (Exercise) 1 2 1      Duration Continue with 30 min of aerobic exercise without signs/symptoms of physical distress. Continue with 30 min of aerobic exercise without signs/symptoms of physical distress. Continue with 30  min of aerobic exercise without signs/symptoms of physical distress.     Intensity Other (comment)  40%-80% HR Max THRR unchanged THRR unchanged       Progression   Progression Continue to progress workloads to maintain intensity without signs/symptoms of physical distress. Continue to progress workloads to maintain intensity without signs/symptoms of physical distress. Continue to progress workloads to maintain intensity without signs/symptoms of physical distress.     Average METs 2 -- 2.3       Resistance Training   Training Prescription Yes Yes Yes     Weight orange bands orange bands orange bands     Reps 10-15 10-15 10-15     Time 10 Minutes 10 Minutes 10 Minutes       Recumbant Bike   Level 1.5 1.5 1.5     Minutes 15 15 15      METs 2.3 2.5 2.3       NuStep   Level 2 2 3      SPM 80 80 80     Minutes 15 15 15      METs 1.9 2 2             Exercise Comments:  Exercise Comments    Row Name 07/18/20 0810           Exercise Comments Pt completed first day of exercise and tolerated well with  no complaints or concerns.              Exercise Goals and Review:  Exercise Goals    Row Name 07/10/20 1344 07/10/20 1621           Exercise Goals   Increase Physical Activity Yes Yes      Intervention Provide advice, education, support and counseling about physical activity/exercise needs.;Develop an individualized exercise prescription for aerobic and resistive training based on initial evaluation findings, risk stratification, comorbidities and participant's personal goals. Provide advice, education, support and counseling about physical activity/exercise needs.;Develop an individualized exercise prescription for aerobic and resistive training based on initial evaluation findings, risk stratification, comorbidities and participant's personal goals.      Expected Outcomes Short Term: Attend rehab on a regular basis to increase amount of physical activity.;Long Term: Exercising  regularly at least 3-5 days a week.;Long Term: Add in home exercise to make exercise part of routine and to increase amount of physical activity. Short Term: Attend rehab on a regular basis to increase amount of physical activity.;Long Term: Exercising regularly at least 3-5 days a week.;Long Term: Add in home exercise to make exercise part of routine and to increase amount of physical activity.      Increase Strength and Stamina Yes Yes      Intervention Provide advice, education, support and counseling about physical activity/exercise needs.;Develop an individualized exercise prescription for aerobic and resistive training based on initial evaluation findings, risk stratification, comorbidities and participant's personal goals. Provide advice, education, support and counseling about physical activity/exercise needs.;Develop an individualized exercise prescription for aerobic and resistive training based on initial evaluation findings, risk stratification, comorbidities and participant's personal goals.      Expected Outcomes Short Term: Increase workloads from initial exercise prescription for resistance, speed, and METs.;Short Term: Perform resistance training exercises routinely during rehab and add in resistance training at home;Long Term: Improve cardiorespiratory fitness, muscular endurance and strength as measured by increased METs and functional capacity (6MWT) Short Term: Increase workloads from initial exercise prescription for resistance, speed, and METs.;Short Term: Perform resistance training exercises routinely during rehab and add in resistance training at home;Long Term: Improve cardiorespiratory fitness, muscular endurance and strength as measured by increased METs and functional capacity (6MWT)      Able to understand and use rate of perceived exertion (RPE) scale Yes Yes      Intervention Provide education and explanation on how to use RPE scale Provide education and explanation on how to  use RPE scale      Expected Outcomes Short Term: Able to use RPE daily in rehab to express subjective intensity level;Long Term:  Able to use RPE to guide intensity level when exercising independently Short Term: Able to use RPE daily in rehab to express subjective intensity level;Long Term:  Able to use RPE to guide intensity level when exercising independently      Able to understand and use Dyspnea scale Yes Yes      Intervention Provide education and explanation on how to use Dyspnea scale Provide education and explanation on how to use Dyspnea scale      Expected Outcomes Short Term: Able to use Dyspnea scale daily in rehab to express subjective sense of shortness of breath during exertion;Long Term: Able to use Dyspnea scale to guide intensity level when exercising independently Short Term: Able to use Dyspnea scale daily in rehab to express subjective sense of shortness of breath during exertion;Long Term: Able to use Dyspnea scale to guide  intensity level when exercising independently      Knowledge and understanding of Target Heart Rate Range (THRR) Yes Yes      Intervention Provide education and explanation of THRR including how the numbers were predicted and where they are located for reference Provide education and explanation of THRR including how the numbers were predicted and where they are located for reference      Expected Outcomes Short Term: Able to state/look up THRR;Long Term: Able to use THRR to govern intensity when exercising independently;Short Term: Able to use daily as guideline for intensity in rehab Short Term: Able to state/look up THRR;Long Term: Able to use THRR to govern intensity when exercising independently;Short Term: Able to use daily as guideline for intensity in rehab      Understanding of Exercise Prescription Yes Yes      Intervention Provide education, explanation, and written materials on patient's individual exercise prescription Provide education, explanation,  and written materials on patient's individual exercise prescription      Expected Outcomes Short Term: Able to explain program exercise prescription;Long Term: Able to explain home exercise prescription to exercise independently Short Term: Able to explain program exercise prescription;Long Term: Able to explain home exercise prescription to exercise independently             Exercise Goals Re-Evaluation :  Exercise Goals Re-Evaluation    Row Name 07/20/20 0814 08/10/20 0812           Exercise Goal Re-Evaluation   Exercise Goals Review Increase Physical Activity;Increase Strength and Stamina;Able to understand and use rate of perceived exertion (RPE) scale;Able to understand and use Dyspnea scale;Knowledge and understanding of Target Heart Rate Range (THRR);Understanding of Exercise Prescription Increase Physical Activity;Increase Strength and Stamina;Able to understand and use rate of perceived exertion (RPE) scale;Able to understand and use Dyspnea scale;Knowledge and understanding of Target Heart Rate Range (THRR);Understanding of Exercise Prescription      Comments Pt has completed 1 exercise session and tolerated well. She is exercising at 1.9 METS on the Nustep and 2.3 METS on the bike. Will continue to monitor and progress as able. Pt has completed 5 exercise sessions and has been making progressions with METS and workloads. She is exercising at 2.0 METS on the Nustep and 2.3 METS on the bike. Will continue to monitor and progress as she is able.      Expected Outcomes Through exercise at rehab and home the patient will decrease shortness of breath with daily activities and feel confident in carrying out an exercise regimn at home. Through exercise at rehab and home the patient will decrease shortness of breath with daily activities and feel confident in carrying out an exercise regimn at home.             Discharge Exercise Prescription (Final Exercise Prescription Changes):  Exercise  Prescription Changes - 08/01/20 0809      Response to Exercise   Blood Pressure (Admit) 140/72    Blood Pressure (Exit) 134/61    Heart Rate (Admit) 98 bpm    Heart Rate (Exercise) 110 bpm    Heart Rate (Exit) 109 bpm    Oxygen Saturation (Admit) 96 %    Oxygen Saturation (Exercise) 95 %    Oxygen Saturation (Exit) 96 %    Rating of Perceived Exertion (Exercise) 13    Perceived Dyspnea (Exercise) 1    Duration Continue with 30 min of aerobic exercise without signs/symptoms of physical distress.    Intensity THRR unchanged  Progression   Progression Continue to progress workloads to maintain intensity without signs/symptoms of physical distress.    Average METs 2.3      Resistance Training   Training Prescription Yes    Weight orange bands    Reps 10-15    Time 10 Minutes      Recumbant Bike   Level 1.5    Minutes 15    METs 2.3      NuStep   Level 3    SPM 80    Minutes 15    METs 2           Nutrition:  Target Goals: Understanding of nutrition guidelines, daily intake of sodium 1500mg , cholesterol 200mg , calories 30% from fat and 7% or less from saturated fats, daily to have 5 or more servings of fruits and vegetables.  Biometrics:  Pre Biometrics - 07/10/20 1622      Pre Biometrics   Grip Strength 22 kg            Nutrition Therapy Plan and Nutrition Goals:  Nutrition Therapy & Goals - 07/27/20 1436      Nutrition Therapy   Diet heart healthy/carb mod      Personal Nutrition Goals   Nutrition Goal Pt to identify food quantities necessary to achieve weight loss of 6-24 lb at graduation from pulmonary rehab.    Personal Goal #2 Pt to build a healthy plate including vegetables, fruits, whole grains, and low-fat dairy products in a heart healthy meal plan.      Intervention Plan   Intervention Prescribe, educate and counsel regarding individualized specific dietary modifications aiming towards targeted core components such as weight, hypertension,  lipid management, diabetes, heart failure and other comorbidities.    Expected Outcomes Short Term Goal: Understand basic principles of dietary content, such as calories, fat, sodium, cholesterol and nutrients.           Nutrition Assessments:  Nutrition Assessments - 07/19/20 1344      Rate Your Plate Scores   Pre Score 57          MEDIFICTS Score Key:  ?70 Need to make dietary changes   40-70 Heart Healthy Diet  ? 40 Therapeutic Level Cholesterol Diet   Picture Your Plate Scores:  <90 Unhealthy dietary pattern with much room for improvement.  41-50 Dietary pattern unlikely to meet recommendations for good health and room for improvement.  51-60 More healthful dietary pattern, with some room for improvement.   >60 Healthy dietary pattern, although there may be some specific behaviors that could be improved.    Nutrition Goals Re-Evaluation:  Nutrition Goals Re-Evaluation    [place] Name 07/27/20 1436 08/10/20 0849           Goals   Current Weight 188 lb (85.3 kg) 185 lb 13.6 oz (84.3 kg)      Nutrition Goal -- Pt to identify food quantities necessary to achieve weight loss of 6-24 lb at graduation from pulmonary rehab.        Personal Goal #2 Re-Evaluation   Personal Goal #2 -- Pt to build a healthy plate including vegetables, fruits, whole grains, and low-fat dairy products in a heart healthy meal plan.             Nutrition Goals Discharge (Final Nutrition Goals Re-Evaluation):  Nutrition Goals Re-Evaluation - 08/10/20 0849      Goals   Current Weight 185 lb 13.6 oz (84.3 kg)    Nutrition Goal Pt to identify  food quantities necessary to achieve weight loss of 6-24 lb at graduation from pulmonary rehab.      Personal Goal #2 Re-Evaluation   Personal Goal #2 Pt to build a healthy plate including vegetables, fruits, whole grains, and low-fat dairy products in a heart healthy meal plan.           Psychosocial: Target Goals: Acknowledge presence or  absence of significant depression and/or stress, maximize coping skills, provide positive support system. Participant is able to verbalize types and ability to use techniques and skills needed for reducing stress and depression.  Initial Review & Psychosocial Screening:  Initial Psych Review & Screening - 07/10/20 1356      Initial Review   Current issues with History of Depression      Family Dynamics   Good Support System? Yes    Comments cares for husband who has memory and physical issues      Barriers   Psychosocial barriers to participate in program The patient should benefit from training in stress management and relaxation.      Screening Interventions   Interventions Encouraged to exercise           Quality of Life Scores:  Scores of 19 and below usually indicate a poorer quality of life in these areas.  A difference of  2-3 points is a clinically meaningful difference.  A difference of 2-3 points in the total score of the Quality of Life Index has been associated with significant improvement in overall quality of life, self-image, physical symptoms, and general health in studies assessing change in quality of life.  PHQ-9: Recent Review Flowsheet Data    Depression screen [facility] 2/9 07/10/2020   Decreased Interest 0   Down, Depressed, Hopeless 0   PHQ - 2 Score 0   Altered sleeping 0   Tired, decreased energy 1   Change in appetite 0   Feeling bad or failure about yourself  0   Trouble concentrating 1   Moving slowly or fidgety/restless 0   Suicidal thoughts 0   PHQ-9 Score 2   Difficult doing work/chores Not difficult at all     Interpretation of Total Score  Total Score Depression Severity:  1-4 = Minimal depression, 5-9 = Mild depression, 10-14 = Moderate depression, 15-19 = Moderately severe depression, 20-27 = Severe depression   Psychosocial Evaluation and Intervention:  Psychosocial Evaluation - 07/19/20 1154      Psychosocial Evaluation & Interventions    Expected Outcomes Bethann will continue to have good managment of her depression both medically and counseling support           Psychosocial Re-Evaluation:  Psychosocial Re-Evaluation    Row Name 07/19/20 1153 07/19/20 1154 08/14/20 1403         Psychosocial Re-Evaluation   Current issues with History of Depression -- History of Depression;Current Stress Concerns     Comments Jarely has completed 1 exercise session and displays healthy positive outlook -- History of depression which is presently stable, she takes care of her husband who has dementia.     Expected Outcomes -- Christel will continue to have good managment of her depression both medically and counseling support.  Ellan will be free of any psychosocial barriers. That Delaynee handles her stress in healthy ways.     Interventions -- Encouraged to attend Pulmonary Rehabilitation for the exercise Encouraged to attend Pulmonary Rehabilitation for the exercise;Stress management education     Continue Psychosocial Services  -- Follow up required  by staff No Follow up required       Initial Review   Source of Stress Concerns -- -- Family            Psychosocial Discharge (Final Psychosocial Re-Evaluation):  Psychosocial Re-Evaluation - 08/14/20 1403      Psychosocial Re-Evaluation   Current issues with History of Depression;Current Stress Concerns    Comments History of depression which is presently stable, she takes care of her husband who has dementia.    Expected Outcomes That Fantashia handles her stress in healthy ways.    Interventions Encouraged to attend Pulmonary Rehabilitation for the exercise;Stress management education    Continue Psychosocial Services  No Follow up required      Initial Review   Source of Stress Concerns Family           Education: Education Goals: Education classes will be provided on a weekly basis, covering required topics. Participant will state understanding/return demonstration of topics  presented.  Learning Barriers/Preferences:  Learning Barriers/Preferences - 07/10/20 1403      Learning Barriers/Preferences   Learning Barriers Sight    Learning Preferences Written Material;Group Instruction;Individual Instruction           Education Topics: Risk Factor Reduction:  -Group instruction that is supported by a PowerPoint presentation. Instructor discusses the definition of a risk factor, different risk factors for pulmonary disease, and how the heart and lungs work together.     PULMONARY REHAB OTHER RESPIRATORY from 07/27/2020 in [place]  Date 07/20/20  Educator Handout  Instruction Review Code 1- Verbalizes Understanding      Nutrition for Pulmonary Patient:  -Group instruction provided by PowerPoint slides, verbal discussion, and written materials to support subject matter. The instructor gives an explanation and review of healthy diet recommendations, which includes a discussion on weight management, recommendations for fruit and vegetable consumption, as well as protein, fluid, caffeine, fiber, sodium, sugar, and alcohol. Tips for eating when patients are short of breath are discussed.   Pursed Lip Breathing:  -Group instruction that is supported by demonstration and informational handouts. Instructor discusses the benefits of pursed lip and diaphragmatic breathing and detailed demonstration on how to preform both.     Oxygen Safety:  -Group instruction provided by PowerPoint, verbal discussion, and written material to support subject matter. There is an overview of "What is Oxygen" and "Why do we need it".  Instructor also reviews how to create a safe environment for oxygen use, the importance of using oxygen as prescribed, and the risks of noncompliance. There is a brief discussion on traveling with oxygen and resources the patient may utilize.   Oxygen Equipment:  -Group instruction provided by [facility] Staff utilizing  handouts, written materials, and equipment demonstrations.   Signs and Symptoms:  -Group instruction provided by written material and verbal discussion to support subject matter. Warning signs and symptoms of infection, stroke, and heart attack are reviewed and when to call the physician/911 reinforced. Tips for preventing the spread of infection discussed.   Advanced Directives:  -Group instruction provided by verbal instruction and written material to support subject matter. Instructor reviews Advanced Directive laws and proper instruction for filling out document.   Pulmonary Video:  -Group video education that reviews the importance of medication and oxygen compliance, exercise, good nutrition, pulmonary hygiene, and pursed lip and diaphragmatic breathing for the pulmonary patient.   Exercise for the Pulmonary Patient:  -Group instruction that is supported by a PowerPoint presentation.  Instructor discusses benefits of exercise, core components of exercise, frequency, duration, and intensity of an exercise routine, importance of utilizing pulse oximetry during exercise, safety while exercising, and options of places to exercise outside of rehab.     PULMONARY REHAB OTHER RESPIRATORY from 07/27/2020 in [place]  Date 07/27/20  Educator handout      Pulmonary Medications:  -Verbally interactive group education provided by instructor with focus on inhaled medications and proper administration.   Anatomy and Physiology of the Respiratory System and Intimacy:  -Group instruction provided by PowerPoint, verbal discussion, and written material to support subject matter. Instructor reviews respiratory cycle and anatomical components of the respiratory system and their functions. Instructor also reviews differences in obstructive and restrictive respiratory diseases with examples of each. Intimacy, Sex, and Sexuality differences are reviewed with a discussion on  how relationships can change when diagnosed with pulmonary disease. Common sexual concerns are reviewed.   MD DAY -A group question and answer session with a medical doctor that allows participants to ask questions that relate to their pulmonary disease state.   OTHER EDUCATION -Group or individual verbal, written, or video instructions that support the educational goals of the pulmonary rehab program.   Holiday Eating Survival Tips:  -Group instruction provided by PowerPoint slides, verbal discussion, and written materials to support subject matter. The instructor gives patients tips, tricks, and techniques to help them not only survive but enjoy the holidays despite the onslaught of food that accompanies the holidays.   Knowledge Questionnaire Score:  Knowledge Questionnaire Score - 07/10/20 1630      Knowledge Questionnaire Score   Pre Score 17/18           Core Components/Risk Factors/Patient Goals at Admission:  Personal Goals and Risk Factors at Admission - 07/10/20 1408      Core Components/Risk Factors/Patient Goals on Admission   Goal Weight: Short Term 178 lb (80.7 kg)    Goal Weight: Long Term 160 lb (72.6 kg)    Expected Outcomes Short Term: Continue to assess and modify interventions until short term weight is achieved;Long Term: Adherence to nutrition and physical activity/exercise program aimed toward attainment of established weight goal;Weight Loss: Understanding of general recommendations for a balanced deficit meal plan, which promotes 1-2 lb weight loss per week and includes a negative energy balance of [phone] kcal/d;Understanding recommendations for meals to include 15-35% energy as protein, 25-35% energy from fat, 35-60% energy from carbohydrates, less than 200mg  of dietary cholesterol, 20-35 gm of total fiber daily;Understanding of distribution of calorie intake throughout the day with the consumption of 4-5 meals/snacks    Improve shortness of breath with  ADL's Yes    Intervention Provide education, individualized exercise plan and daily activity instruction to help decrease symptoms of SOB with activities of daily living.    Expected Outcomes Short Term: Improve cardiorespiratory fitness to achieve a reduction of symptoms when performing ADLs;Long Term: Be able to perform more ADLs without symptoms or delay the onset of symptoms    Hypertension Yes    Intervention Provide education on lifestyle modifcations including regular physical activity/exercise, weight management, moderate sodium restriction and increased consumption of fresh fruit, vegetables, and low fat dairy, alcohol moderation, and smoking cessation.;Monitor prescription use compliance.    Expected Outcomes Short Term: Continued assessment and intervention until BP is < 140/34mm HG in hypertensive participants. < 130/48mm HG in hypertensive participants with diabetes, heart failure or chronic kidney disease.;Long Term: Maintenance of blood pressure at  goal levels.    Lipids Yes    Intervention Provide education and support for participant on nutrition & aerobic/resistive exercise along with prescribed medications to achieve LDL 70mg , HDL >40mg .    Expected Outcomes Short Term: Participant states understanding of desired cholesterol values and is compliant with medications prescribed. Participant is following exercise prescription and nutrition guidelines.;Long Term: Cholesterol controlled with medications as prescribed, with individualized exercise RX and with personalized nutrition plan. Value goals: LDL < 70mg , HDL > 40 mg.           Core Components/Risk Factors/Patient Goals Review:   Goals and Risk Factor Review    Row Name 07/19/20 1155 08/14/20 1405           Core Components/Risk Factors/Patient Goals Review   Personal Goals Review Weight Management/Obesity;Improve shortness of breath with ADL's;Hypertension;Lipids;Develop more efficient breathing techniques such as purse  lipped breathing and diaphragmatic breathing and practicing self-pacing with activity.;Increase knowledge of respiratory medications and ability to use respiratory devices properly. Improve shortness of breath with ADL's;Develop more efficient breathing techniques such as purse lipped breathing and diaphragmatic breathing and practicing self-pacing with activity.;Increase knowledge of respiratory medications and ability to use respiratory devices properly.      Review Elayah has completed 1 exercise session.  Not enough information to assess progress toward admission goals. Janith just started pulmonary rehab, has attended 5 exercises sessions, then she was on vacation for 10 days.  She will return to exercise tomorrow and will continue increasing her exercise workloads.      Expected Outcomes See Admission goals That Gari will see much improvement in her strength, stamina, and shortness of breath in the next full 30 days.             Core Components/Risk Factors/Patient Goals at Discharge (Final Review):   Goals and Risk Factor Review - 08/14/20 1405      Core Components/Risk Factors/Patient Goals Review   Personal Goals Review Improve shortness of breath with ADL's;Develop more efficient breathing techniques such as purse lipped breathing and diaphragmatic breathing and practicing self-pacing with activity.;Increase knowledge of respiratory medications and ability to use respiratory devices properly.    Review Briawna just started pulmonary rehab, has attended 5 exercises sessions, then she was on vacation for 10 days.  She will return to exercise tomorrow and will continue increasing her exercise workloads.    Expected Outcomes That Kinzlie will see much improvement in her strength, stamina, and shortness of breath in the next full 30 days.           ITP Comments:   Comments: ITP REVIEW Pt is making expected progress toward pulmonary rehab goals after completing 5 sessions. Recommend continued  exercise, life style modification, education, and utilization of breathing techniques to increase stamina and strength and decrease shortness of breath with exertion.

## 2020-08-14 NOTE — Telephone Encounter (Signed)
Called to alert her that I saw she went to the ED to evaluate chest pain she had 2 days ago.  She is scheduled for a stress test 08/21/2020.  Informed to not exercise in pulmonary rehab until the results are back and she is medically cleared.

## 2020-08-14 NOTE — Discharge Summary (Signed)
Physician Discharge Summary  LEEZA HEINER NTI:[id number] DOB: 07/05/1946 DOA: 08/13/2020  PCP: Crist Infante, MD  Admit date: 08/13/2020 Discharge date: 08/14/2020  Admitted From: Home Disposition:  Home  Recommendations for Outpatient Follow-up:  Follow up with cardiology  Discharge Condition: Stable CODE STATUS: Full code Diet recommendation: Heart healthy  Brief/Interim Summary: From H&P by Dr. Trilby Drummer: "HPI: Shelley Spencer is a 74 y.o. female with medical history significant of GERD, asthma, hypertension, CVA, hyperlipidemia, low back pain, CAD, diastolic heart failure who presents with chest pain.  Patient reports that she had an episode of chest pain that began when she was walking around the grocery store.  The pain was located in her chest with a tight bandlike pressure that began to radiate to her jaw.  The pain was intense for about 45 minutes to an hour and began to subside.  The pain had radiated and was more localized at her upper back by the time she presented to the ED.  She had some shortness of breath though with her asthma this is not out of the ordinary for her she is in pulmonary rehab.  She denied any nausea, vomiting, diaphoresis.  She denies fevers, chills, abdominal pain. Of note, she does report a similar episode also of chest pain that was less intense when walking around [place] last Thursday.  ED Course: Vital signs in ED significant for BP 150s.  Lab work showed BMP with creatinine of 1.3 which appears stable from recent records.  CBC within normal limits.  High-sensitivity troponin negative x2.  BNP negative.  RVP for flu and Covid negative.  Chest x-ray without acute abnormality.  CTA did not show any dissection, but did show mild stenosis of SMA and 2 cm slightly hyperdense lesion of the lower pole of the right kidney.  Patient received aspirin in ED."   Patient was evaluated by cardiology.  Patient was started on Imdur, planned for outpatient stress test.  Patient  was discharged home in stable condition  Discharge Diagnoses:  Principal Problem:   Stable angina ([place]) Active Problems:   HLD (hyperlipidemia)   Hypertension   Airway hyperreactivity   Benign essential HTN   Acid reflux   CVA (cerebral vascular accident) ([place])   Diastolic dysfunction   CAD (coronary artery disease)   Discharge Instructions  Discharge Instructions    (HEART FAILURE PATIENTS) Call MD:  Anytime you have any of the following symptoms: 1) 3 pound weight gain in 24 hours or 5 pounds in 1 week 2) shortness of breath, with or without a dry hacking cough 3) swelling in the hands, feet or stomach 4) if you have to sleep on extra pillows at night in order to breathe.   Complete by: As directed    Call MD for:  difficulty breathing, headache or visual disturbances   Complete by: As directed    Call MD for:  extreme fatigue   Complete by: As directed    Call MD for:  persistant dizziness or light-headedness   Complete by: As directed    Call MD for:  persistant nausea and vomiting   Complete by: As directed    Call MD for:  severe uncontrolled pain   Complete by: As directed    Call MD for:  temperature >100.4   Complete by: As directed    Discharge instructions   Complete by: As directed    You were cared for by a hospitalist during your hospital stay. If you have any  questions about your discharge medications or the care you received while you were in the hospital after you are discharged, you can call the unit and ask to speak with the hospitalist on call if the hospitalist that took care of you is not available. Once you are discharged, your primary care physician will handle any further medical issues. Please note that NO REFILLS for any discharge medications will be authorized once you are discharged, as it is imperative that you return to your primary care physician (or establish a relationship with a primary care physician if you do not have one) for your aftercare  needs so that they can reassess your need for medications and monitor your lab values.   Increase activity slowly   Complete by: As directed      Allergies as of 08/14/2020      Reactions   Amlodipine Other (See Comments)   Legs swell some and she doesn't like it.   Fenofibrate Other (See Comments)   Leg pain   Omeprazole Other (See Comments)   Unknown   Simvastatin Other (See Comments)   Myalgia, leg pains   Singulair [montelukast Sodium] Other (See Comments)   Affected mood   Statins Other (See Comments)   Myalgia, leg pains      Medication List    TAKE these medications   acetaminophen 650 MG CR tablet Commonly known as: TYLENOL Take 1,300 mg by mouth every 8 (eight) hours as needed for pain.   albuterol 108 (90 Base) MCG/ACT inhaler Commonly known as: VENTOLIN HFA Inhale 1-2 puffs into the lungs every 6 (six) hours as needed for wheezing or shortness of breath.   aspirin 81 MG EC tablet Take 1 tablet (81 mg total) by mouth daily. Swallow whole.   budesonide-formoterol 160-4.5 MCG/ACT inhaler Commonly known as: SYMBICORT Inhale 2 puffs into the lungs daily.   buPROPion 150 MG 24 hr tablet Commonly known as: WELLBUTRIN XL Take 150 mg by mouth daily.   CALCIUM-VITAMIN D PO Take 1 tablet by mouth daily.   cetirizine 10 MG tablet Commonly known as: ZYRTEC Take 10 mg by mouth daily.   docusate sodium 100 MG capsule Commonly known as: COLACE Take 1 capsule (100 mg total) by mouth 2 (two) times daily.   doxazosin 4 MG tablet Commonly known as: CARDURA Take 4 mg by mouth daily.   ezetimibe 10 MG tablet Commonly known as: ZETIA Take 1 tablet (10 mg total) by mouth daily after supper.   furosemide 40 MG tablet Commonly known as: LASIX Take 40 mg by mouth daily.   icosapent Ethyl 1 g capsule Commonly known as: Vascepa Take 2 capsules (2 g total) by mouth 2 (two) times daily.   Investigational - Study Medication Take 1 tablet by mouth daily. Study name:  [place] stroke study Additional study details: [id number] bottle: 5, 15, 25 mg or placebo   Investigational - Study Medication Take 1 tablet by mouth daily. Study name: [place] stroke study Additional study details: [id number] bottle: 5, 15, 25 mg or placebo tablet (PI - Sethi)   isosorbide mononitrate 30 MG 24 hr tablet Commonly known as: IMDUR Take 1 tablet (30 mg total) by mouth daily.   MULTIVITAMIN PO Take 1 tablet by mouth daily.   nitroGLYCERIN 0.4 MG SL tablet Commonly known as: NITROSTAT Place 1 tablet (0.4 mg total) under the tongue every 5 (five) minutes as needed for chest pain.   olmesartan 40 MG tablet Commonly known as: BENICAR Take 40 mg  by mouth every evening.   pantoprazole 40 MG tablet Commonly known as: PROTONIX Take 1 tablet (40 mg total) by mouth 2 (two) times daily. What changed: when to take this   PARoxetine 40 MG tablet Commonly known as: PAXIL Take 40 mg by mouth every evening.   potassium chloride SA 20 MEQ tablet Commonly known as: KLOR-CON Take 20 mEq by mouth daily.   Praluent 150 MG/ML [place] Generic drug: Alirocumab Inject 1 Syringe into the skin every 14 (fourteen) days.   simvastatin 20 MG tablet Commonly known as: ZOCOR Take 1 tablet (20 mg total) by mouth as directed. 1 tablet Every other day   traZODone 150 MG tablet Commonly known as: DESYREL Take 150 mg by mouth at bedtime.   vitamin B-12 1000 MCG tablet Commonly known as: CYANOCOBALAMIN Take 1,000 mcg by mouth daily.       Follow-up Information    Cantwell, Celeste C, PA-C Follow up.   Specialty: Cardiology Why: Stress test on 08/21/20 at 8:15 AM Carotid US 08/12/20 at 8:30 PM Office visit w/on 08/30/20 at 1 PM Contact information: [place] [address] [phone]              Allergies  Allergen Reactions  . Amlodipine Other (See Comments)    Legs swell some and she doesn't like it.  . Fenofibrate Other (See Comments)     Leg pain  . Omeprazole Other (See Comments)    Unknown  . Simvastatin Other (See Comments)    Myalgia, leg pains  . Singulair [Montelukast Sodium] Other (See Comments)    Affected mood  . Statins Other (See Comments)    Myalgia, leg pains    Consultations:  Cardiology    Procedures/Studies: DG Chest 2 View  Result Date: 08/13/2020 CLINICAL DATA:  Chest pain EXAM: CHEST - 2 VIEW COMPARISON:  12/22/2013 FINDINGS: Surgical hardware in the cervical spine. No focal opacity or pleural effusion. Normal cardiomediastinal silhouette. No pneumothorax. IMPRESSION: No active cardiopulmonary disease. Electronically Signed   By: Donavan Foil M.D.   On: 08/13/2020 17:18   CT Angio Chest/Abd/Pel for Dissection W and/or Wo Contrast  Result Date: 08/13/2020 CLINICAL DATA:  Acute chest and back pain EXAM: CT ANGIOGRAPHY CHEST, ABDOMEN AND PELVIS TECHNIQUE: Non-contrast CT of the chest was initially obtained. Multidetector CT imaging through the chest, abdomen and pelvis was performed using the standard protocol during bolus administration of intravenous contrast. Multiplanar reconstructed images and MIPs were obtained and reviewed to evaluate the vascular anatomy. CONTRAST:  30mL OMNIPAQUE IOHEXOL 350 MG/ML SOLN COMPARISON:  None. FINDINGS: CTA CHEST FINDINGS Cardiovascular: --Heart: The heart size is normal. Mild coronary artery calcifications are seen. There is nopericardial effusion. --Aorta: The course and caliber of the thoracic aorta are normal. There is scattered mild aortic atherosclerotic calcification. Precontrast images show no aortic intramural hematoma. There is no blood pool, dissection or penetrating ulcer demonstrated on arterial phase postcontrast imaging. There is a conventional 3 vessel aortic arch branching pattern. The proximal arch vessels are widely patent. --Pulmonary Arteries: Contrast timing is optimized for preferential opacification of the aorta. Within that limitation, normal  central pulmonary arteries. Mediastinum/Nodes: No mediastinal, hilar or axillary lymphadenopathy. The visualized thyroid and thoracic esophageal course are unremarkable. Lungs/Pleura: No pulmonary nodules or masses. No pleural effusion or pneumothorax. Mild bibasilar dependent atelectasis is noted. There is streaky atelectasis at the anterior lung base. No focal pleural abnormality. Musculoskeletal: No chest wall abnormality. No acute osseous findings. Review of  the MIP images confirms the above findings. CTA ABDOMEN AND PELVIS FINDINGS VASCULAR Aorta: Normal caliber aorta without aneurysm, dissection, vasculitis or hemodynamically significant stenosis. There is moderate aortic atherosclerosis. Celiac: No aneurysm, dissection or hemodynamically significant stenosis. Normal branching pattern scattered mild atherosclerosis is seen. 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 Renals: Single renal arteries bilaterally. No aneurysm, dissection, stenosis or evidence of fibromuscular dysplasia. IMA: Patent without abnormality. Inflow: Scattered moderate atherosclerosis is seen. Veins: Normal course and caliber of the major veins. Assessment is otherwise limited by the arterial dominant contrast phase. Review of the MIP images confirms the above findings. NON-VASCULAR Hepatobiliary: Normal hepatic contours and density. No visible biliary dilatation. Normal gallbladder. Pancreas: Normal contours without ductal dilatation. No peripancreatic fluid collection. Spleen: Normal arterial phase splenic enhancement pattern. Adrenals/Urinary Tract: --Adrenal glands: Normal. --Right kidney/ureter: A 2 cm hyperdense lesion is seen partially exophytic off the lower pole of the right kidney. --Left kidney/ureter: No hydronephrosis or perinephric stranding. No nephrolithiasis. No obstructing ureteral stones. --Urinary bladder: Unremarkable. Stomach/Bowel: --Stomach/Duodenum: No hiatal hernia or other gastric  abnormality. Normal duodenal course and caliber. --Small bowel: No dilatation or inflammation. --Colon: No focal abnormality. --Appendix: Normal. Lymphatic:  No abdominal or pelvic lymphadenopathy. Reproductive: No free fluid in the pelvis. The patient is status post hysterectomy. No adnexal masses or collections seen. Musculoskeletal. No bony spinal canal stenosis or focal osseous abnormality. Lumbar fixation hardware seen at L5-S1 and partially visualized within the cervical spine. Other: None. Review of the MIP images confirms the above findings. IMPRESSION: 1. No acute aortic abnormality. 2. Mild to moderate aortic atherosclerosis. 3. 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 4. 2 cm slightly hyperdense lesion off the lower pole of the right kidney which could represent a hemorrhagic/proteinaceous cyst. If further evaluation is required would recommend dedicated renal ultrasound. 5.  Aortic Atherosclerosis (ICD10-I70.0). Electronically Signed   By: Prudencio Pair M.D.   On: 08/13/2020 20:24      Discharge Exam: Vitals:   08/14/20 0945 08/14/20 1000  BP: (!) 161/73 (!) 152/74  Pulse: 70 72  Resp: 16 15  Temp:    SpO2: 94% 98%    General: Pt is alert, awake, not in acute distress Cardiovascular: RRR, S1/S2 +, no edema Respiratory: CTA bilaterally, no wheezing, no rhonchi, no respiratory distress, no conversational dyspnea  Abdominal: Soft, NT, ND, bowel sounds + Extremities: no edema, no cyanosis Psych: Normal mood and affect, stable judgement and insight     The results of significant diagnostics from this hospitalization (including imaging, microbiology, ancillary and laboratory) are listed below for reference.     Microbiology: Recent Results (from the past 240 hour(s))  Respiratory Panel by RT PCR (Flu A&B, Covid) - Nasopharyngeal Swab     Status: None   Collection Time: 08/13/20  9:38 PM   Specimen: Nasopharyngeal Swab; Nasopharyngeal(NP) swabs in vial  transport medium  Result Value Ref Range Status   SARS Coronavirus 2 by RT PCR NEGATIVE NEGATIVE Final    Comment: (NOTE) SARS-CoV-2 target nucleic acids are NOT DETECTED.  The SARS-CoV-2 RNA is generally detectable in upper respiratoy specimens during the acute phase of infection. The lowest concentration of SARS-CoV-2 viral copies this assay can detect is 131 copies/mL. A negative result does not preclude SARS-Cov-2 infection and should not be used as the sole basis for treatment or other patient management decisions. A negative result may occur with  improper specimen collection/handling, submission of specimen other  than nasopharyngeal swab, presence of viral mutation(s) within the areas targeted by this assay, and inadequate number of viral copies (<131 copies/mL). A negative result must be combined with clinical observations, patient history, and epidemiological information. The expected result is Negative.  Fact Sheet for Patients:  PinkCheek.be  Fact Sheet for Healthcare Providers:  GravelBags.it  This test is no t yet approved or cleared by the [place] FDA and  has been authorized for detection and/or diagnosis of SARS-CoV-2 by FDA under an Emergency Use Authorization (EUA). This EUA will remain  in effect (meaning this test can be used) for the duration of the COVID-19 declaration under Section 564(b)(1) of the Act, 21 U.S.C. section 360bbb-3(b)(1), unless the authorization is terminated or revoked sooner.     Influenza A by PCR NEGATIVE NEGATIVE Final   Influenza B by PCR NEGATIVE NEGATIVE Final    Comment: (NOTE) The Xpert Xpress SARS-CoV-2/FLU/RSV assay is intended as an aid in  the diagnosis of influenza from Nasopharyngeal swab specimens and  should not be used as a sole basis for treatment. Nasal washings and  aspirates are unacceptable for Xpert Xpress SARS-CoV-2/FLU/RSV  testing.  Fact Sheet  for Patients: PinkCheek.be  Fact Sheet for Healthcare Providers: GravelBags.it  This test is not yet approved or cleared by the [place] FDA and  has been authorized for detection and/or diagnosis of SARS-CoV-2 by  FDA under an Emergency Use Authorization (EUA). This EUA will remain  in effect (meaning this test can be used) for the duration of the  Covid-19 declaration under Section 564(b)(1) of the Act, 21  U.S.C. section 360bbb-3(b)(1), unless the authorization is  terminated or revoked. Performed at [facility], [place] [address] [place], [place] [address]      Labs: BNP (last 3 results) Recent Labs    08/13/20 1828  BNP 29.7   Basic Metabolic Panel: Recent Labs  Lab 08/13/20 1711  NA 142  K 3.7  CL 103  CO2 29  GLUCOSE 91  BUN 19  CREATININE 1.35*  CALCIUM 9.3   Liver Function Tests: No results for input(s): AST, ALT, ALKPHOS, BILITOT, PROT, ALBUMIN in the last 168 hours. No results for input(s): LIPASE, AMYLASE in the last 168 hours. No results for input(s): AMMONIA in the last 168 hours. CBC: Recent Labs  Lab 08/13/20 1711  WBC 7.4  HGB 12.4  HCT 40.4  MCV 82.3  PLT 252   Cardiac Enzymes: No results for input(s): CKTOTAL, CKMB, CKMBINDEX, TROPONINI in the last 168 hours. BNP: Invalid input(s): POCBNP CBG: No results for input(s): GLUCAP in the last 168 hours. D-Dimer No results for input(s): DDIMER in the last 72 hours. Hgb A1c No results for input(s): HGBA1C in the last 72 hours. Lipid Profile No results for input(s): CHOL, HDL, LDLCALC, TRIG, CHOLHDL, LDLDIRECT in the last 72 hours. Thyroid function studies No results for input(s): TSH, T4TOTAL, T3FREE, THYROIDAB in the last 72 hours.  Invalid input(s): FREET3 Anemia work up No results for input(s): VITAMINB12, FOLATE, FERRITIN, TIBC, IRON, RETICCTPCT in the last 72 hours. Urinalysis    Component Value Date/Time    COLORURINE YELLOW 03/14/2020 2259   APPEARANCEUR CLEAR 03/14/2020 2259   LABSPEC 1.015 03/14/2020 2259   PHURINE 5.0 03/14/2020 2259   GLUCOSEU NEGATIVE 03/14/2020 2259   HGBUR NEGATIVE 03/14/2020 [place] 03/14/2020 [place] 03/14/2020 2259   PROTEINUR NEGATIVE 03/14/2020 2259   UROBILINOGEN 1.0 08/24/2008 2036   NITRITE NEGATIVE 03/14/2020  [place] (A) 03/14/2020 2259   Sepsis Labs Invalid input(s): PROCALCITONIN,  WBC,  LACTICIDVEN Microbiology Recent Results (from the past 240 hour(s))  Respiratory Panel by RT PCR (Flu A&B, Covid) - Nasopharyngeal Swab     Status: None   Collection Time: 08/13/20  9:38 PM   Specimen: Nasopharyngeal Swab; Nasopharyngeal(NP) swabs in vial transport medium  Result Value Ref Range Status   SARS Coronavirus 2 by RT PCR NEGATIVE NEGATIVE Final    Comment: (NOTE) SARS-CoV-2 target nucleic acids are NOT DETECTED.  The SARS-CoV-2 RNA is generally detectable in upper respiratoy specimens during the acute phase of infection. The lowest concentration of SARS-CoV-2 viral copies this assay can detect is 131 copies/mL. A negative result does not preclude SARS-Cov-2 infection and should not be used as the sole basis for treatment or other patient management decisions. A negative result may occur with  improper specimen collection/handling, submission of specimen other than nasopharyngeal swab, presence of viral mutation(s) within the areas targeted by this assay, and inadequate number of viral copies (<131 copies/mL). A negative result must be combined with clinical observations, patient history, and epidemiological information. The expected result is Negative.  Fact Sheet for Patients:  PinkCheek.be  Fact Sheet for Healthcare Providers:  GravelBags.it  This test is no t yet approved or cleared by the [place] FDA and  has been  authorized for detection and/or diagnosis of SARS-CoV-2 by FDA under an Emergency Use Authorization (EUA). This EUA will remain  in effect (meaning this test can be used) for the duration of the COVID-19 declaration under Section 564(b)(1) of the Act, 21 U.S.C. section 360bbb-3(b)(1), unless the authorization is terminated or revoked sooner.     Influenza A by PCR NEGATIVE NEGATIVE Final   Influenza B by PCR NEGATIVE NEGATIVE Final    Comment: (NOTE) The Xpert Xpress SARS-CoV-2/FLU/RSV assay is intended as an aid in  the diagnosis of influenza from Nasopharyngeal swab specimens and  should not be used as a sole basis for treatment. Nasal washings and  aspirates are unacceptable for Xpert Xpress SARS-CoV-2/FLU/RSV  testing.  Fact Sheet for Patients: PinkCheek.be  Fact Sheet for Healthcare Providers: GravelBags.it  This test is not yet approved or cleared by the [place] FDA and  has been authorized for detection and/or diagnosis of SARS-CoV-2 by  FDA under an Emergency Use Authorization (EUA). This EUA will remain  in effect (meaning this test can be used) for the duration of the  Covid-19 declaration under Section 564(b)(1) of the Act, 21  U.S.C. section 360bbb-3(b)(1), unless the authorization is  terminated or revoked. Performed at [facility], [place] [address], [place], [place] [address]      Patient was seen and examined on the day of discharge and was found to be in stable condition. Time coordinating discharge: 40 minutes including assessment and coordination of care, as well as examination of the patient.   SIGNED:  Dessa Phi, DO [facility] 08/14/2020, 10:50 AM

## 2020-08-14 NOTE — ED Notes (Signed)
Ordered breakfast 

## 2020-08-15 ENCOUNTER — Encounter (HOSPITAL_COMMUNITY)
Admission: RE | Admit: 2020-08-15 | Discharge: 2020-08-15 | Disposition: A | Discharge status: Home / Self Care | Payer: Medicare Other | Source: Ambulatory Visit | Attending: Cardiology | Admitting: Cardiology

## 2020-08-15 DIAGNOSIS — J45909 Unspecified asthma, uncomplicated: Secondary | ICD-10-CM

## 2020-08-21 ENCOUNTER — Ambulatory Visit: Payer: Medicare Other

## 2020-08-21 ENCOUNTER — Other Ambulatory Visit: Payer: Medicare Other

## 2020-08-21 ENCOUNTER — Other Ambulatory Visit: Payer: Self-pay

## 2020-08-21 DIAGNOSIS — I25118 Atherosclerotic heart disease of native coronary artery with other forms of angina pectoris: Secondary | ICD-10-CM

## 2020-08-22 ENCOUNTER — Ambulatory Visit: Payer: Medicare Other

## 2020-08-22 ENCOUNTER — Encounter (HOSPITAL_COMMUNITY): Payer: Medicare Other

## 2020-08-22 DIAGNOSIS — Z8673 Personal history of transient ischemic attack (TIA), and cerebral infarction without residual deficits: Secondary | ICD-10-CM

## 2020-08-22 DIAGNOSIS — R0989 Other specified symptoms and signs involving the circulatory and respiratory systems: Secondary | ICD-10-CM

## 2020-08-22 NOTE — Progress Notes (Signed)
Called patient, no answer. Left a vm. Will try again later. AD/S

## 2020-08-22 NOTE — Progress Notes (Signed)
No significant abnormality on stress test. Continue current medications. Keep f/u w/Celeste Cantwell on 12/14.  Thanks MJP

## 2020-08-23 NOTE — Progress Notes (Signed)
[place] pt seen by me in hospital, seeing [facility] on 12/14

## 2020-08-23 NOTE — Progress Notes (Signed)
Please inform patient stress test normal and she has mild narrowing of carotid artery without significantly blocking blood flow. We will discuss further at follow up visit.

## 2020-08-24 ENCOUNTER — Telehealth: Payer: Self-pay

## 2020-08-24 ENCOUNTER — Telehealth (HOSPITAL_COMMUNITY): Payer: Self-pay | Admitting: *Deleted

## 2020-08-24 ENCOUNTER — Encounter (HOSPITAL_COMMUNITY): Payer: Medicare Other

## 2020-08-24 NOTE — Telephone Encounter (Signed)
If Remo Lipps from rehab calls for a clearance pt needs to be seen before clearing her. Pt has an appt on 12/14.

## 2020-08-24 NOTE — Telephone Encounter (Signed)
Shelley Spencer from [facility] rehab called to inform us that about 2 weeks ago pt went to the ER due to chest pain. Pt had a stress test done and it was normal. But Shelley Spencer needs a clearance letter for her to go back to the rehab. Please provide thank you

## 2020-08-24 NOTE — Telephone Encounter (Signed)
Has f/u w/Celeste Cantwell, PA on 12/14. I would recommend f/u before clearance  Thanks MJP

## 2020-08-24 NOTE — Progress Notes (Signed)
Called and spoke with patient regarding carotid artery duplex results. Patient voiced understanding. AD/S

## 2020-08-29 ENCOUNTER — Encounter (HOSPITAL_COMMUNITY): Payer: Medicare Other

## 2020-08-30 ENCOUNTER — Ambulatory Visit: Payer: Medicare Other | Admitting: Student

## 2020-08-31 ENCOUNTER — Encounter (HOSPITAL_COMMUNITY)
Admission: RE | Admit: 2020-08-31 | Discharge: 2020-08-31 | Disposition: A | Discharge status: Home / Self Care | Payer: Medicare Other | Source: Ambulatory Visit | Attending: Cardiology | Admitting: Cardiology

## 2020-08-31 ENCOUNTER — Telehealth (HOSPITAL_COMMUNITY): Payer: Self-pay | Admitting: *Deleted

## 2020-08-31 DIAGNOSIS — J45909 Unspecified asthma, uncomplicated: Secondary | ICD-10-CM | POA: Insufficient documentation

## 2020-09-04 NOTE — Addendum Note (Signed)
Encounter addended by: George Ina, RD on: 09/04/2020 1:43 PM  Actions taken: Flowsheet data copied forward, Flowsheet accepted

## 2020-09-04 NOTE — Progress Notes (Signed)
Primary Physician:  Crist Infante, MD   Patient ID: Shelley Spencer, female    DOB: Aug 01, 1946, 74 y.o.   MRN: [id number]  Subjective:    Chief Complaint  Patient presents with  . Hospitalization Follow-up    HPI: Shelley Spencer  is a 74 y.o. female  with asthma, hypertension, hyperlipidemia, hyperglycemia, CKD stage 3, former tobacco use with moderate CAD with LAD 50-60% stenosis by coronary CTA on 03/30/2019  (not significant stenosis by FFR), moderate AR by echo in July 2020.  She is a former smoker that quit in 1981. Patient was admitted to the hospital on 03/14/2020 with gait disturbance involving her left lower extremity, had a right pontine stroke.  Fortunately she has recuperated well from this.  MRI showed moderate chronic small vessel disease.  Patient is statin intolerant, and has previously been on both Zetia and Vascepa.  She stopped Zetia due to muscle aches.  Patient stopped Vascepa due to constipation.  Patient presents for follow up after evaluation in [facility] emergency department 08/13/2020 for complaints of chest pain.  Work-up in the ED showed high sensitive troponins negative x2, EKG without ischemic changes, and CTA without dissection.  She was evaluated by Dr. Virgina Jock at this time and started on Imdur 30 mg daily for chronic stable angina.  Since starting Imdur patient has had no recurrence of angina.  She is tolerating Imdur well.  Patient reports home blood pressure readings 120s/70s.  She is presently taking Lasix 40 mg daily.  Patient denies chest pain, palpitations, leg swelling, orthopnea, PND.  She does report chronic shortness of breath and fatigue over the last 1 year.  Patient has been recommended to start pulmonary rehab.  Patient does know that her asthma symptoms have been worse over the last 4-6 weeks.  She continues to follow with her PCP for management of asthma.  Patient also expresses concern that she does not sleep well at night, she is unsure if she  snores or not.  Past Medical History:  Diagnosis Date  . Anemia   . Anxiety   . Arthritis   . Asthma   . Bicornuate uterus    double cervix  . CAD (coronary artery disease)   . Colon polyp   . Depression   . Deviated septum   . DJD (degenerative joint disease)    L-Spine  . Fibroids   . GERD (gastroesophageal reflux disease)   . Hematuria   . Hiatal hernia   . HNP (herniated nucleus pulposus)    lower back  . Hyperlipidemia   . Hypertension   . MVP (mitral valve prolapse)    pt denies, pt states she had a Echo ?2008 and it did not show MVP  . Pre-diabetes   . Retinal tear 2005   Small right retinal tear  . Shingles 1980's   on her waist    Past Surgical History:  Procedure Laterality Date  . ANTERIOR CERVICAL DECOMP/DISCECTOMY FUSION N/A 09/05/2017   Procedure: Cervical four-five, Cervical five-six, Cervical six-seven Anterior cervical decompression/discectomy/fusion;  Surgeon: Erline Levine, MD;  Location: [facility];  Service: Neurosurgery;  Laterality: N/A;  . bone spur surgery Bilateral    x 3  . CARDIOVASCULAR STRESS TEST  02/08/2014  . COLONOSCOPY    . CYST EXCISION Left 09/29/2018   Procedure: LEFT MIDDLE FINGER CYST REMOVAL AND DEBRIDEMENT OF DISTAL INTERPHALANGEAL JOINT;  Surgeon: Daryll Brod, MD;  Location: [facility];  Service: Orthopedics;  Laterality: Left;  .  FINGER SURGERY Left    left third finger mucoid cyst removed  . FOOT NEUROMA SURGERY Bilateral   . HYSTEROSCOPY     D&C PMB Endo Cx Polyp  . KNEE ARTHROSCOPY WITH MENISCAL REPAIR Right 2016   --Flossie Dibble, MD  . LAPAROSCOPIC HYSTERECTOMY  7/209/10   R-TL[facility]/BSO  Fibroids, BAck Pain, Adenomyosis  . LUMBAR FUSION    . removal vaginal septum    . TOTAL KNEE ARTHROPLASTY Right 05/17/2019   Procedure: TOTAL KNEE ARTHROPLASTY;  Surgeon: Gaynelle Arabian, MD;  Location: [facility];  Service: Orthopedics;  Laterality: Right;  52mn  . TUBAL LIGATION     Social History   Tobacco Use  . Smoking  status: Former Smoker    Packs/day: 1.00    Years: 20.00    Pack years: 20.00    Types: Cigarettes    Quit date: 09/24/1979    Years since quitting: 40.9  . Smokeless tobacco: Never Used  Substance Use Topics  . Alcohol use: Not Currently    Alcohol/week: 1.0 standard drink    Types: 1 Standard drinks or equivalent per week    Marital status: Married   ROS   Review of Systems  Constitutional: Positive for malaise/fatigue. Negative for weight gain.  Cardiovascular: Positive for dyspnea on exertion. Negative for chest pain, claudication, leg swelling, near-syncope, orthopnea, palpitations, paroxysmal nocturnal dyspnea and syncope.  Respiratory: Negative for shortness of breath.   Hematologic/Lymphatic: Does not bruise/bleed easily.  Gastrointestinal: Negative for melena.  Neurological: Negative for dizziness and weakness.   Objective:  Blood pressure 128/82, pulse 77, resp. rate 16, height _0  (1.575 m), weight 188 lb 3.2 oz (85.4 kg), SpO2 97 %. Body mass index is 34.42 kg/m.   Vitals with BMI 09/05/2020 08/14/2020 08/14/2020  Height _1  - -  Weight 188 lbs 3 oz - -  BMI 300.34- -  Systolic [id number] Diastolic 82 82 80  Pulse 77 68 75      Physical Exam Vitals reviewed.  Constitutional:      Appearance: She is well-developed.     Comments: Moderately obese  HENT:     Head: Normocephalic and atraumatic.  Cardiovascular:     Rate and Rhythm: Normal rate and regular rhythm.     Pulses: Intact distal pulses.     Heart sounds: S1 normal and S2 normal. Murmur heard.  High-pitched decrescendo early diastolic murmur is present with a grade of 2/4 at the upper right sternal border. No gallop.      Comments: No JVD. No leg edema. Pulmonary:     Effort: Pulmonary effort is normal. No accessory muscle usage or respiratory distress.     Breath sounds: Normal breath sounds. No wheezing, rhonchi or rales.  Abdominal:     General: Bowel sounds are normal.     Palpations:  Abdomen is soft.  Musculoskeletal:        General: Normal range of motion.     Right lower leg: No edema.     Left lower leg: No edema.  Neurological:     Mental Status: She is alert.  Psychiatric:        Behavior: Behavior is cooperative.    Laboratory examination:    CMP Latest Ref Rng & Units 08/13/2020 03/15/2020 03/14/2020  Glucose 70 - 99 mg/dL 91 - 101(H)  BUN 8 - 23 mg/dL 19 - 20  Creatinine 0.44 - 1.00 mg/dL 1.35(H) - 1.49(H)  Sodium 135 - 145 mmol/L 142 -  143  Potassium 3.5 - 5.1 mmol/L 3.7 - 3.4(L)  Chloride 98 - 111 mmol/L 103 - 104  CO2 22 - 32 mmol/L 29 - 27  Calcium 8.9 - 10.3 mg/dL 9.3 - 9.0  Total Protein 6.5 - 8.1 g/dL - 6.6 -  Total Bilirubin 0.3 - 1.2 mg/dL - 0.6 -  Alkaline Phos 38 - 126 U/L - 90 -  AST 15 - 41 U/L - 31 -  ALT 0 - 44 U/L - 18 -   CBC Latest Ref Rng & Units 08/13/2020 03/14/2020 05/18/2019  WBC 4.0 - 10.5 K/uL 7.4 7.3 10.2  Hemoglobin 12.0 - 15.0 g/dL 12.4 11.3(L) 10.5(L)  Hematocrit 36.0 - 46.0 % 40.4 37.7 34.4(L)  Platelets 150 - 400 K/uL 252 231 217   Lipid Panel Recent Labs    12/28/19 1452 03/15/20 0803  CHOL 211* 160  TRIG 282* 101  LDLCALC 95 77  VLDL  --  20  HDL 69 63  CHOLHDL 3.1 2.5     HEMOGLOBIN A1C Lab Results  Component Value Date   HGBA1C 6.1 (H) 03/15/2020   MPG 128.37 03/15/2020   TSH Recent Labs    03/15/20 0853  TSH 2.500     Vitamin D 1, 25 (OH)2 Total 12/28/2019 pg/mL 34     External labs:  05/07/2019: Creatinine 1.1, EGFR 49/59, potassium 4.1, BMP normal.  Normal H&H, MCH low at 25.5, MCHC 30.0, CBC otherwise normal.  Cholesterol 203, triglycerides 107, HDL 64, LDL 118. TSH normal.   Radiology  No results found.  MR angio of the neck and head 03/15/2020 without contrast: 1. No intracranial large vessel occlusion or proximal high-grade arterial stenosis. 2. 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 3. Acute right paramedian pontine infarct.  CT  angio chest, abdomen, pelvis 08/13/2020: 1. No acute aortic abnormality. 2. Mild to moderate aortic atherosclerosis. 3. 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 4. 2 cm slightly hyperdense lesion off the lower pole of the right kidney which could represent a hemorrhagic/proteinaceous cyst. If further evaluation is required would recommend dedicated renal ultrasound. 5.  Aortic Atherosclerosis (ICD10-I70.0).  Chest x-ray 08/13/2020: No active cardiopulmonary disease  Cardiac Studies:   Coronary CTA 03/30/2019:  1. Coronary calcium score of 33. This was 76 percentile for age and sex matched control. 2. Normal coronary origin with left dominance. 3. Moderate plaque with stenosis 50-69% in the mid LAD. CAD RADS 3. Additional analysis with CT FFR will be submitted.  Coronary CTA FFR 03/30/2019:  1. Left Main: No significant stenosis. 2. LAD: No significant stenosis. 3. LCX: No significant stenosis. 4. RCA: No significant stenosis. IMPRESSION: 1.  CT FFR analysis didn't show any significant stenosis.  Echocardiogram 03/15/2020  1. Technically difficult echo with poor image quality.  2. Left ventricular ejection fraction, by estimation, is 65 to 70%. The  left ventricle has normal function. The left ventricle has no regional  wall motion abnormalities. Left ventricular diastolic parameters are  consistent with Grade I diastolic dysfunction (impaired relaxation).  3. Right ventricular systolic function is normal. The right ventricular  size is normal.  4. The mitral valve is grossly normal. No evidence of mitral valve  regurgitation.  5. The aortic valve is grossly normal. Aortic valve regurgitation is not  visualized. No aortic stenosis is present.  Compared to 04/23/2019, moderate aortic regurgitation could not be visualized due to poor echo window.  Otherwise no significant change  Carotid  duplex 08/22/2020: Minimal stenosis in the right internal carotid artery  (1-15%). Stenosis in the right external carotid artery (>50%).  Minimal stenosis in the left internal carotid artery (1-15%). Stenosis in the left external carotid artery (<50%).  Antegrade right vertebral artery flow. Antegrade left vertebral artery flow.  External carotid stenosis may be the source of bruit.  Follow up studies is appropriate if clinically indicated.  PCV MYOCARDIAL PERFUSION WO LEXISCAN 08/21/2020 Normal ECG stress. The patient exercised for 5 minutes and 0 seconds of a Bruce protocol, achieving approximately 6.83 METs.  Normal blood pressure response.  Accelerated heart rate response suggest aerobic intolerance.  Stress terminated due to dyspnea and fatigue. Myocardial perfusion: There is mild breast tissue attenuation inferiorly. There is no ischemia or scar. Overall LV systolic function is abnormal without regional wall motion abnormalities. Stress LV EF: 47%. Visually appears normal. The LV size was small and may be contributing to calculation of LVEF error. No previous exam available for comparison. Low risk.  Cardiac Studies:   EKG12/14/2021: sinus rhythm at a rate of 74 bpm, left atrial enlargement.  Normal axis.  Poor R wave progression, can exclude anteroseptal infarct old.  Low voltage complexes.  Compared to EKG 03/15/2019, PRWP new.  Assessment:     ICD-10-CM   1. Coronary artery disease of native artery of native heart with stable angina pectoris ([place])  I25.118 EKG 12-Lead  2. Bilateral carotid artery stenosis  I65.23 Lipid Panel With LDL/HDL Ratio    PCV CAROTID DUPLEX (BILATERAL)  3. Benign essential HTN  I10 Lipid Panel With LDL/HDL Ratio  4. Other fatigue  R53.83 Ambulatory referral to Sleep Studies   Meds ordered this encounter  Medications  . isosorbide mononitrate (IMDUR) 30 MG 24 hr tablet    Sig: Take 1 tablet (30 mg total) by mouth daily.    Dispense:  90 tablet    Refill:  3   Medications Discontinued During This Encounter  Medication Reason   . docusate sodium (COLACE) 100 MG capsule Patient Preference  . ezetimibe (ZETIA) 10 MG tablet Patient Preference  . icosapent Ethyl (VASCEPA) 1 g capsule Patient Preference  . simvastatin (ZOCOR) 20 MG tablet Discontinued by provider  . isosorbide mononitrate (IMDUR) 30 MG 24 hr tablet Reorder    Recommendations:   Shelley Spencer  is a 74 y.o. female  with hypertension, hyperlipidemia, hyperglycemia, CKD stage 3, former tobacco use with moderate CAD with LAD 50-60% stenosis by coronary CTA on 03/30/2019  (not significant stenosis by FFR), moderate AR by echo in July 2020.  She is a former smoker that quit in 1981.  Patient was admitted to the hospital on 03/14/2020 with gait disturbance involving her left lower extremity, had a right pontine stroke.  Fortunately she has recuperated well from this.  MRI showed moderate chronic small vessel disease. Of note she is statin intolerant.   Patient presents for follow-up after recent evaluation in the emergency department for chest pain.  She has had no recurrence of angina since initiation of Imdur.  Patient underwent nuclear stress testing on 08/21/2020 which was low risk without significant abnormality concerning for ischemia.  We will continue Imdur 30 mg daily.  Also reviewed and discussed with patient regarding results of carotid artery duplex revealing mild stenosis of the carotid arteries bilaterally.  We will repeat carotid artery surveillance duplex in approximately 1 year.  Recommend risk factor management.    Blood pressure was initially elevated in the office today, however upon recheck  and according to home readings it is well controlled.  We will continue current antihypertensive medications including doxazosin, furosemide, Imdur, olmesartan.  Patient is presently on Praluent, and I personally reviewed external labs which showed lipids to be well controlled.  However last lipid panel available for review was from 02/2020, therefore will repeat  lipid profile testing at this time.  In regard to fatigue and difficulty sleeping suspect patient may have underlying sleep apnea.  Will send referral to [facility] for evaluation of sleep apnea.  Patient is stable from a cardiovascular standpoint to undergo pulmonary rehab at this time.  We will send clearance note.  In view of low risk stress test as well as stable echocardiogram, suspect underlying deconditioning and/or lung etiology of dyspnea on exertion.    Alethia Berthold, PA-C 09/05/2020, 12:58 PM Office: [phone]

## 2020-09-05 ENCOUNTER — Ambulatory Visit: Payer: Medicare Other | Admitting: Student

## 2020-09-05 ENCOUNTER — Encounter (HOSPITAL_COMMUNITY)
Admission: RE | Admit: 2020-09-05 | Discharge: 2020-09-05 | Disposition: A | Discharge status: Home / Self Care | Payer: Medicare Other | Source: Ambulatory Visit | Attending: Cardiology | Admitting: Cardiology

## 2020-09-05 ENCOUNTER — Other Ambulatory Visit: Payer: Self-pay

## 2020-09-05 ENCOUNTER — Encounter: Payer: Self-pay | Admitting: Student

## 2020-09-05 VITALS — BP 128/82 | HR 77 | Resp 16 | Ht 62.0 in | Wt 188.2 lb

## 2020-09-05 DIAGNOSIS — I6523 Occlusion and stenosis of bilateral carotid arteries: Secondary | ICD-10-CM

## 2020-09-05 DIAGNOSIS — I25118 Atherosclerotic heart disease of native coronary artery with other forms of angina pectoris: Secondary | ICD-10-CM

## 2020-09-05 DIAGNOSIS — I10 Essential (primary) hypertension: Secondary | ICD-10-CM

## 2020-09-05 DIAGNOSIS — R5383 Other fatigue: Secondary | ICD-10-CM

## 2020-09-05 DIAGNOSIS — J45909 Unspecified asthma, uncomplicated: Secondary | ICD-10-CM

## 2020-09-05 MED ORDER — ISOSORBIDE MONONITRATE ER 30 MG PO TB24
30.0000 mg | ORAL_TABLET | Freq: Every day | ORAL | 3 refills | Status: DC
Start: 2020-09-05 — End: 2021-09-05

## 2020-09-05 NOTE — Progress Notes (Signed)
Pulmonary Individual Treatment Plan  Patient Details  Name: Shelley Spencer MRN: [id number] Date of Birth: November 19, 1945 Referring Provider:   April Manson Pulmonary Rehab Walk Test from 07/10/2020 in [place]  Referring Provider Dr. Letta Median      Initial Encounter Date:  Flowsheet Row Pulmonary Rehab Walk Test from 07/10/2020 in [place]  Date 07/10/20      Visit Diagnosis: Uncomplicated asthma, unspecified asthma severity, unspecified whether persistent  Patient's Home Medications on Admission:   Current Outpatient Medications:  .  acetaminophen (TYLENOL) 650 MG CR tablet, Take 1,300 mg by mouth every 8 (eight) hours as needed for pain. , Disp: , Rfl:  .  albuterol (VENTOLIN HFA) 108 (90 Base) MCG/ACT inhaler, Inhale 1-2 puffs into the lungs every 6 (six) hours as needed for wheezing or shortness of breath. , Disp: , Rfl:  .  Alirocumab (PRALUENT) 150 MG/ML SOAJ, Inject 1 Syringe into the skin every 14 (fourteen) days., Disp: 6 mL, Rfl: 3 .  aspirin EC 81 MG EC tablet, Take 1 tablet (81 mg total) by mouth daily. Swallow whole., Disp: 30 tablet, Rfl: 11 .  budesonide-formoterol (SYMBICORT) 160-4.5 MCG/ACT inhaler, Inhale 2 puffs into the lungs daily. , Disp: , Rfl:  .  buPROPion (WELLBUTRIN XL) 150 MG 24 hr tablet, Take 150 mg by mouth daily., Disp: , Rfl:  .  CALCIUM-VITAMIN D PO, Take 1 tablet by mouth daily., Disp: , Rfl:  .  cetirizine (ZYRTEC) 10 MG tablet, Take 10 mg by mouth daily., Disp: , Rfl:  .  docusate sodium (COLACE) 100 MG capsule, Take 1 capsule (100 mg total) by mouth 2 (two) times daily. (Patient not taking: Reported on 07/10/2020), Disp: 60 capsule, Rfl: 0 .  doxazosin (CARDURA) 4 MG tablet, Take 4 mg by mouth daily. , Disp: , Rfl:  .  ezetimibe (ZETIA) 10 MG tablet, Take 1 tablet (10 mg total) by mouth daily after supper. (Patient not taking: Reported on 04/26/2020), Disp: 90 tablet, Rfl: 3 .  furosemide  (LASIX) 40 MG tablet, Take 40 mg by mouth daily., Disp: , Rfl:  .  icosapent Ethyl (VASCEPA) 1 g capsule, Take 2 capsules (2 g total) by mouth 2 (two) times daily. (Patient not taking: Reported on 07/10/2020), Disp: 120 capsule, Rfl: 6 .  Investigational - Study Medication, Take 1 tablet by mouth daily. Study name: [place] stroke study Additional study details: [id number] bottle: 5, 15, 25 mg or placebo, Disp: 1 each, Rfl: PRN .  Investigational - Study Medication, Take 1 tablet by mouth daily. Study name: [place] stroke study Additional study details: [id number]/green bottle: 5, 15, 25 mg or placebo tablet (PI - Sethi) (Patient not taking: Reported on 08/13/2020), Disp: 1 each, Rfl: PRN .  isosorbide mononitrate (IMDUR) 30 MG 24 hr tablet, Take 1 tablet (30 mg total) by mouth daily., Disp: 30 tablet, Rfl: 2 .  Multiple Vitamins-Minerals (MULTIVITAMIN PO), Take 1 tablet by mouth daily., Disp: , Rfl:  .  nitroGLYCERIN (NITROSTAT) 0.4 MG SL tablet, Place 1 tablet (0.4 mg total) under the tongue every 5 (five) minutes as needed for chest pain., Disp: 30 tablet, Rfl: 2 .  olmesartan (BENICAR) 40 MG tablet, Take 40 mg by mouth every evening., Disp: , Rfl:  .  pantoprazole (PROTONIX) 40 MG tablet, Take 1 tablet (40 mg total) by mouth 2 (two) times daily. (Patient taking differently: Take 40 mg by mouth every evening. ), Disp: 180 tablet, Rfl: 3 .  PARoxetine (PAXIL) 40 MG tablet, Take 40 mg by mouth every evening. , Disp: , Rfl:  .  potassium chloride SA (K-DUR) 20 MEQ tablet, Take 20 mEq by mouth daily. , Disp: , Rfl:  .  simvastatin (ZOCOR) 20 MG tablet, Take 1 tablet (20 mg total) by mouth as directed. 1 tablet Every other day (Patient not taking: Reported on 04/26/2020), Disp: 90 tablet, Rfl: 0 .  traZODone (DESYREL) 150 MG tablet, Take 150 mg by mouth at bedtime., Disp: , Rfl:  .  vitamin B-12 (CYANOCOBALAMIN) 1000 MCG tablet, Take 1,000 mcg by mouth daily., Disp: , Rfl:   Past Medical  History: Past Medical History:  Diagnosis Date  . Anemia   . Anxiety   . Arthritis   . Asthma   . Bicornuate uterus    double cervix  . CAD (coronary artery disease)   . Colon polyp   . Depression   . Deviated septum   . DJD (degenerative joint disease)    L-Spine  . Fibroids   . GERD (gastroesophageal reflux disease)   . Hematuria   . Hiatal hernia   . HNP (herniated nucleus pulposus)    lower back  . Hyperlipidemia   . Hypertension   . MVP (mitral valve prolapse)    pt denies, pt states she had a Echo ?2008 and it did not show MVP  . Pre-diabetes   . Retinal tear 2005   Small right retinal tear  . Shingles 1980's   on her waist    Tobacco Use: Social History   Tobacco Use  Smoking Status Former Smoker  . Packs/day: 1.00  . Years: 20.00  . Pack years: 20.00  . Types: Cigarettes  . Quit date: 09/24/1979  . Years since quitting: 40.9  Smokeless Tobacco Never Used    Labs: Recent Chemical engineer    Labs for ITP Cardiac and Pulmonary Rehab Latest Ref Rng & Units 08/24/2008 09/02/2017 12/28/2019 03/15/2020   Cholestrol 0 - 200 mg/dL - - 211(H) 160   LDLCALC 0 - 99 mg/dL - - 95 77   HDL >40 mg/dL - - 69 63   Trlycerides <150 mg/dL - - 282(H) 101   Hemoglobin A1c 4.8 - 5.6 % - 5.7(H) - 6.1(H)   TCO2 0 - 100 mmol/L 27 - - -      Capillary Blood Glucose: Lab Results  Component Value Date   GLUCAP 96 03/16/2020   GLUCAP 83 03/16/2020   GLUCAP 95 03/16/2020   GLUCAP 146 (H) 03/15/2020   GLUCAP 86 03/15/2020     Pulmonary Assessment Scores:  Pulmonary Assessment Scores    Row Name 07/10/20 1630         ADL UCSD   ADL Phase Entry     SOB Score total 46           CAT Score   CAT Score 17           mMRC Score   mMRC Score 3           UCSD: Self-administered rating of dyspnea associated with activities of daily living (ADLs) 6-point scale (0 = "not at all" to 5 = "maximal or unable to do because of breathlessness")  Scoring Scores range  from 0 to 120.  Minimally important difference is 5 units  CAT: CAT can identify the health impairment of COPD patients and is better correlated with disease progression.  CAT has a scoring range of zero to 40. The CAT  score is classified into four groups of low (less than 10), medium (10 - 20), high (21-30) and very high (31-40) based on the impact level of disease on health status. A CAT score over 10 suggests significant symptoms.  A worsening CAT score could be explained by an exacerbation, poor medication adherence, poor inhaler technique, or progression of COPD or comorbid conditions.  CAT MCID is 2 points  mMRC: mMRC (Modified Medical Research Council) Dyspnea Scale is used to assess the degree of baseline functional disability in patients of respiratory disease due to dyspnea. No minimal important difference is established. A decrease in score of 1 point or greater is considered a positive change.   Pulmonary Function Assessment:   Exercise Target Goals: Exercise Program Goal: Individual exercise prescription set using results from initial 6 min walk test and THRR while considering  patient's activity barriers and safety.   Exercise Prescription Goal: Initial exercise prescription builds to 30-45 minutes a day of aerobic activity, 2-3 days per week.  Home exercise guidelines will be given to patient during program as part of exercise prescription that the participant will acknowledge.  Activity Barriers & Risk Stratification:  Activity Barriers & Cardiac Risk Stratification - 07/10/20 1343      Activity Barriers & Cardiac Risk Stratification   Activity Barriers Arthritis;Back Problems;Balance Concerns;History of Falls;Shortness of Breath;Joint Problems;Deconditioning           6 Minute Walk:  6 Minute Walk    Row Name 07/10/20 1622         6 Minute Walk   Phase Initial     Distance 1188 feet     Walk Time 6 minutes     # of Rest Breaks 1  stopped at 5 minutes and 40  seconds due to hip pain scale 5/10     MPH 2.25     METS 2.22     RPE 13     Perceived Dyspnea  2     VO2 Peak 7.76     Symptoms Yes (comment)     Comments Hip pain 5/10     Resting HR 84 bpm     Resting BP 110/56     Resting Oxygen Saturation  94 %     Exercise Oxygen Saturation  during 6 min walk 82 %     Max Ex. HR 113 bpm     Max Ex. BP 120/68     2 Minute Post BP 110/60           Interval HR   1 Minute HR 95     2 Minute HR 92     3 Minute HR 111     4 Minute HR 111     5 Minute HR 113     6 Minute HR 102     2 Minute Post HR 93     Interval Heart Rate? Yes           Interval Oxygen   Interval Oxygen? Yes     Baseline Oxygen Saturation % 94 %     1 Minute Oxygen Saturation % 86 %     1 Minute Liters of Oxygen 0 L     2 Minute Oxygen Saturation % 82 %     2 Minute Liters of Oxygen 0 L     3 Minute Oxygen Saturation % 97 %     3 Minute Liters of Oxygen 0 L     4 Minute Oxygen Saturation %  97 %     4 Minute Liters of Oxygen 0 L     5 Minute Oxygen Saturation % 97 %     5 Minute Liters of Oxygen 0 L     6 Minute Oxygen Saturation % 98 %     6 Minute Liters of Oxygen 0 L     2 Minute Post Oxygen Saturation % 99 %     2 Minute Post Liters of Oxygen 0 L            Oxygen Initial Assessment:  Oxygen Initial Assessment - 07/10/20 1620      Home Oxygen   Home Oxygen Device None    Sleep Oxygen Prescription None    Home Exercise Oxygen Prescription None    Home Resting Oxygen Prescription None      Initial 6 min Walk   Oxygen Used None      Program Oxygen Prescription   Program Oxygen Prescription None      Intervention   Short Term Goals To learn and exhibit compliance with exercise, home and travel O2 prescription;To learn and understand importance of maintaining oxygen saturations>88%;To learn and demonstrate proper use of respiratory medications;To learn and understand importance of monitoring SPO2 with pulse oximeter and demonstrate accurate use of the  pulse oximeter.;To learn and demonstrate proper pursed lip breathing techniques or other breathing techniques.    Long  Term Goals Exhibits compliance with exercise, home and travel O2 prescription;Verbalizes importance of monitoring SPO2 with pulse oximeter and return demonstration;Maintenance of O2 saturations>88%;Exhibits proper breathing techniques, such as pursed lip breathing or other method taught during program session;Compliance with respiratory medication;Demonstrates proper use of MDI's           Oxygen Re-Evaluation:  Oxygen Re-Evaluation    Row Name 07/20/20 0815 08/10/20 0813 09/05/20 0844         Program Oxygen Prescription   Program Oxygen Prescription None None None           Home Oxygen   Home Oxygen Device None None None     Sleep Oxygen Prescription None None None     Home Exercise Oxygen Prescription None None None     Home Resting Oxygen Prescription None None None           Goals/Expected Outcomes   Short Term Goals To learn and exhibit compliance with exercise, home and travel O2 prescription;To learn and understand importance of maintaining oxygen saturations>88%;To learn and demonstrate proper use of respiratory medications;To learn and understand importance of monitoring SPO2 with pulse oximeter and demonstrate accurate use of the pulse oximeter.;To learn and demonstrate proper pursed lip breathing techniques or other breathing techniques. To learn and exhibit compliance with exercise, home and travel O2 prescription;To learn and understand importance of maintaining oxygen saturations>88%;To learn and demonstrate proper use of respiratory medications;To learn and understand importance of monitoring SPO2 with pulse oximeter and demonstrate accurate use of the pulse oximeter.;To learn and demonstrate proper pursed lip breathing techniques or other breathing techniques. To learn and exhibit compliance with exercise, home and travel O2 prescription;To learn and  understand importance of maintaining oxygen saturations>88%;To learn and demonstrate proper use of respiratory medications;To learn and understand importance of monitoring SPO2 with pulse oximeter and demonstrate accurate use of the pulse oximeter.;To learn and demonstrate proper pursed lip breathing techniques or other breathing techniques.     Long  Term Goals Exhibits compliance with exercise, home and travel O2 prescription;Verbalizes importance of monitoring SPO2 with pulse oximeter  and return demonstration;Maintenance of O2 saturations>88%;Exhibits proper breathing techniques, such as pursed lip breathing or other method taught during program session;Compliance with respiratory medication;Demonstrates proper use of MDI's Exhibits compliance with exercise, home and travel O2 prescription;Verbalizes importance of monitoring SPO2 with pulse oximeter and return demonstration;Maintenance of O2 saturations>88%;Exhibits proper breathing techniques, such as pursed lip breathing or other method taught during program session;Compliance with respiratory medication;Demonstrates proper use of MDI's Exhibits compliance with exercise, home and travel O2 prescription;Verbalizes importance of monitoring SPO2 with pulse oximeter and return demonstration;Maintenance of O2 saturations>88%;Exhibits proper breathing techniques, such as pursed lip breathing or other method taught during program session;Compliance with respiratory medication;Demonstrates proper use of MDI's     Goals/Expected Outcomes compliance and understanding of oxygen saturation and pursed lip breathing compliance and understanding of oxygen saturation and pursed lip breathing compliance and understanding of oxygen saturation and pursed lip breathing            Oxygen Discharge (Final Oxygen Re-Evaluation):  Oxygen Re-Evaluation - 09/05/20 0844      Program Oxygen Prescription   Program Oxygen Prescription None      Home Oxygen   Home Oxygen  Device None    Sleep Oxygen Prescription None    Home Exercise Oxygen Prescription None    Home Resting Oxygen Prescription None      Goals/Expected Outcomes   Short Term Goals To learn and exhibit compliance with exercise, home and travel O2 prescription;To learn and understand importance of maintaining oxygen saturations>88%;To learn and demonstrate proper use of respiratory medications;To learn and understand importance of monitoring SPO2 with pulse oximeter and demonstrate accurate use of the pulse oximeter.;To learn and demonstrate proper pursed lip breathing techniques or other breathing techniques.    Long  Term Goals Exhibits compliance with exercise, home and travel O2 prescription;Verbalizes importance of monitoring SPO2 with pulse oximeter and return demonstration;Maintenance of O2 saturations>88%;Exhibits proper breathing techniques, such as pursed lip breathing or other method taught during program session;Compliance with respiratory medication;Demonstrates proper use of MDI's    Goals/Expected Outcomes compliance and understanding of oxygen saturation and pursed lip breathing           Initial Exercise Prescription:  Initial Exercise Prescription - 07/13/20 0700      Date of Initial Exercise RX and Referring Provider   Date 07/10/20    Referring Provider Dr. Letta Median    Expected Discharge Date 09/14/20      Recumbant Bike   Level 1.5    Minutes 15      NuStep   Level 2    Minutes 15      Prescription Details   Frequency (times per week) 2    Duration Progress to 30 minutes of continuous aerobic without signs/symptoms of physical distress      Intensity   THRR 40-80% of Max Heartrate 59-118    Ratings of Perceived Exertion 11-13    Perceived Dyspnea 0-4      Progression   Progression Continue to progress workloads to maintain intensity without signs/symptoms of physical distress.      Resistance Training   Training Prescription Yes    Weight orange bands     Reps 10-15           Perform Capillary Blood Glucose checks as needed.  Exercise Prescription Changes:  Exercise Prescription Changes    Row Name 07/20/20 0800 07/25/20 1400 08/01/20 0809         Response to Exercise   Blood Pressure (Admit) 140/72 132/70  140/72     Blood Pressure (Exercise) 140/72 122/70 --     Blood Pressure (Exit) 114/68 114/66 134/61     Heart Rate (Admit) 83 bpm 85 bpm 98 bpm     Heart Rate (Exercise) 107 bpm 120 bpm 110 bpm     Heart Rate (Exit) 96 bpm 85 bpm 109 bpm     Oxygen Saturation (Admit) 97 % 97 % 96 %     Oxygen Saturation (Exercise) 97 % 88 % 95 %     Oxygen Saturation (Exit) 96 % 97 % 96 %     Rating of Perceived Exertion (Exercise) 13 13 13      Perceived Dyspnea (Exercise) 1 2 1      Duration Continue with 30 min of aerobic exercise without signs/symptoms of physical distress. Continue with 30 min of aerobic exercise without signs/symptoms of physical distress. Continue with 30 min of aerobic exercise without signs/symptoms of physical distress.     Intensity Other (comment)  40%-80% HR Max THRR unchanged THRR unchanged           Progression   Progression Continue to progress workloads to maintain intensity without signs/symptoms of physical distress. Continue to progress workloads to maintain intensity without signs/symptoms of physical distress. Continue to progress workloads to maintain intensity without signs/symptoms of physical distress.     Average METs 2 -- 2.3           Resistance Training   Training Prescription Yes Yes Yes     Weight orange bands orange bands orange bands     Reps 10-15 10-15 10-15     Time 10 Minutes 10 Minutes 10 Minutes           Recumbant Bike   Level 1.5 1.5 1.5     Minutes 15 15 15      METs 2.3 2.5 2.3           NuStep   Level 2 2 3      SPM 80 80 80     Minutes 15 15 15      METs 1.9 2 2             Exercise Comments:  Exercise Comments    Row Name 07/18/20 0810           Exercise  Comments Pt completed first day of exercise and tolerated well with no complaints or concerns.              Exercise Goals and Review:  Exercise Goals    Row Name 07/10/20 1344 07/10/20 1621           Exercise Goals   Increase Physical Activity Yes Yes      Intervention Provide advice, education, support and counseling about physical activity/exercise needs.;Develop an individualized exercise prescription for aerobic and resistive training based on initial evaluation findings, risk stratification, comorbidities and participant's personal goals. Provide advice, education, support and counseling about physical activity/exercise needs.;Develop an individualized exercise prescription for aerobic and resistive training based on initial evaluation findings, risk stratification, comorbidities and participant's personal goals.      Expected Outcomes Short Term: Attend rehab on a regular basis to increase amount of physical activity.;Long Term: Exercising regularly at least 3-5 days a week.;Long Term: Add in home exercise to make exercise part of routine and to increase amount of physical activity. Short Term: Attend rehab on a regular basis to increase amount of physical activity.;Long Term: Exercising regularly at least 3-5 days a week.;Long Term: Add in  home exercise to make exercise part of routine and to increase amount of physical activity.      Increase Strength and Stamina Yes Yes      Intervention Provide advice, education, support and counseling about physical activity/exercise needs.;Develop an individualized exercise prescription for aerobic and resistive training based on initial evaluation findings, risk stratification, comorbidities and participant's personal goals. Provide advice, education, support and counseling about physical activity/exercise needs.;Develop an individualized exercise prescription for aerobic and resistive training based on initial evaluation findings, risk  stratification, comorbidities and participant's personal goals.      Expected Outcomes Short Term: Increase workloads from initial exercise prescription for resistance, speed, and METs.;Short Term: Perform resistance training exercises routinely during rehab and add in resistance training at home;Long Term: Improve cardiorespiratory fitness, muscular endurance and strength as measured by increased METs and functional capacity (6MWT) Short Term: Increase workloads from initial exercise prescription for resistance, speed, and METs.;Short Term: Perform resistance training exercises routinely during rehab and add in resistance training at home;Long Term: Improve cardiorespiratory fitness, muscular endurance and strength as measured by increased METs and functional capacity (6MWT)      Able to understand and use rate of perceived exertion (RPE) scale Yes Yes      Intervention Provide education and explanation on how to use RPE scale Provide education and explanation on how to use RPE scale      Expected Outcomes Short Term: Able to use RPE daily in rehab to express subjective intensity level;Long Term:  Able to use RPE to guide intensity level when exercising independently Short Term: Able to use RPE daily in rehab to express subjective intensity level;Long Term:  Able to use RPE to guide intensity level when exercising independently      Able to understand and use Dyspnea scale Yes Yes      Intervention Provide education and explanation on how to use Dyspnea scale Provide education and explanation on how to use Dyspnea scale      Expected Outcomes Short Term: Able to use Dyspnea scale daily in rehab to express subjective sense of shortness of breath during exertion;Long Term: Able to use Dyspnea scale to guide intensity level when exercising independently Short Term: Able to use Dyspnea scale daily in rehab to express subjective sense of shortness of breath during exertion;Long Term: Able to use Dyspnea scale to  guide intensity level when exercising independently      Knowledge and understanding of Target Heart Rate Range (THRR) Yes Yes      Intervention Provide education and explanation of THRR including how the numbers were predicted and where they are located for reference Provide education and explanation of THRR including how the numbers were predicted and where they are located for reference      Expected Outcomes Short Term: Able to state/look up THRR;Long Term: Able to use THRR to govern intensity when exercising independently;Short Term: Able to use daily as guideline for intensity in rehab Short Term: Able to state/look up THRR;Long Term: Able to use THRR to govern intensity when exercising independently;Short Term: Able to use daily as guideline for intensity in rehab      Understanding of Exercise Prescription Yes Yes      Intervention Provide education, explanation, and written materials on patient's individual exercise prescription Provide education, explanation, and written materials on patient's individual exercise prescription      Expected Outcomes Short Term: Able to explain program exercise prescription;Long Term: Able to explain home exercise prescription to exercise independently  Short Term: Able to explain program exercise prescription;Long Term: Able to explain home exercise prescription to exercise independently             Exercise Goals Re-Evaluation :  Exercise Goals Re-Evaluation    Row Name 07/20/20 0814 08/10/20 0812 09/05/20 0841         Exercise Goal Re-Evaluation   Exercise Goals Review Increase Physical Activity;Increase Strength and Stamina;Able to understand and use rate of perceived exertion (RPE) scale;Able to understand and use Dyspnea scale;Knowledge and understanding of Target Heart Rate Range (THRR);Understanding of Exercise Prescription Increase Physical Activity;Increase Strength and Stamina;Able to understand and use rate of perceived exertion (RPE) scale;Able  to understand and use Dyspnea scale;Knowledge and understanding of Target Heart Rate Range (THRR);Understanding of Exercise Prescription Increase Physical Activity;Increase Strength and Stamina;Able to understand and use rate of perceived exertion (RPE) scale;Able to understand and use Dyspnea scale;Knowledge and understanding of Target Heart Rate Range (THRR);Understanding of Exercise Prescription     Comments Pt has completed 1 exercise session and tolerated well. She is exercising at 1.9 METS on the Nustep and 2.3 METS on the bike. Will continue to monitor and progress as able. Pt has completed 5 exercise sessions and has been making progressions with METS and workloads. She is exercising at 2.0 METS on the Nustep and 2.3 METS on the bike. Will continue to monitor and progress as she is able. Pt has been out from rehab waiting to be cleared from the doctor. She had a stress test which showed nothing abnormal, but the Dr. said she needed a follow up appt before she could be cleared. Just waiting on her clearance note. Will continue to follow and monitor     Expected Outcomes Through exercise at rehab and home the patient will decrease shortness of breath with daily activities and feel confident in carrying out an exercise regimn at home. Through exercise at rehab and home the patient will decrease shortness of breath with daily activities and feel confident in carrying out an exercise regimn at home. Through exercise at rehab and home the patient will decrease shortness of breath with daily activities and feel confident in carrying out an exercise regimn at home.            Discharge Exercise Prescription (Final Exercise Prescription Changes):  Exercise Prescription Changes - 08/01/20 0809      Response to Exercise   Blood Pressure (Admit) 140/72    Blood Pressure (Exit) 134/61    Heart Rate (Admit) 98 bpm    Heart Rate (Exercise) 110 bpm    Heart Rate (Exit) 109 bpm    Oxygen Saturation (Admit)  96 %    Oxygen Saturation (Exercise) 95 %    Oxygen Saturation (Exit) 96 %    Rating of Perceived Exertion (Exercise) 13    Perceived Dyspnea (Exercise) 1    Duration Continue with 30 min of aerobic exercise without signs/symptoms of physical distress.    Intensity THRR unchanged      Progression   Progression Continue to progress workloads to maintain intensity without signs/symptoms of physical distress.    Average METs 2.3      Resistance Training   Training Prescription Yes    Weight orange bands    Reps 10-15    Time 10 Minutes      Recumbant Bike   Level 1.5    Minutes 15    METs 2.3      NuStep   Level 3  SPM 80    Minutes 15    METs 2           Nutrition:  Target Goals: Understanding of nutrition guidelines, daily intake of sodium 1500mg , cholesterol 200mg , calories 30% from fat and 7% or less from saturated fats, daily to have 5 or more servings of fruits and vegetables.  Biometrics:  Pre Biometrics - 07/10/20 1622      Pre Biometrics   Grip Strength 22 kg            Nutrition Therapy Plan and Nutrition Goals:  Nutrition Therapy & Goals - 07/27/20 1436      Nutrition Therapy   Diet heart healthy/carb mod      Personal Nutrition Goals   Nutrition Goal Pt to identify food quantities necessary to achieve weight loss of 6-24 lb at graduation from pulmonary rehab.    Personal Goal #2 Pt to build a healthy plate including vegetables, fruits, whole grains, and low-fat dairy products in a heart healthy meal plan.      Intervention Plan   Intervention Prescribe, educate and counsel regarding individualized specific dietary modifications aiming towards targeted core components such as weight, hypertension, lipid management, diabetes, heart failure and other comorbidities.    Expected Outcomes Short Term Goal: Understand basic principles of dietary content, such as calories, fat, sodium, cholesterol and nutrients.           Nutrition Assessments:   Nutrition Assessments - 07/19/20 1344      Rate Your Plate Scores   Pre Score 57          MEDIFICTS Score Key:  ?70 Need to make dietary changes   40-70 Heart Healthy Diet  ? 40 Therapeutic Level Cholesterol Diet   Picture Your Plate Scores:  <01 Unhealthy dietary pattern with much room for improvement.  41-50 Dietary pattern unlikely to meet recommendations for good health and room for improvement.  51-60 More healthful dietary pattern, with some room for improvement.   >60 Healthy dietary pattern, although there may be some specific behaviors that could be improved.    Nutrition Goals Re-Evaluation:  Nutrition Goals Re-Evaluation    [place] Name 07/27/20 1436 08/10/20 0849 09/04/20 1342         Goals   Current Weight 188 lb (85.3 kg) 185 lb 13.6 oz (84.3 kg) 185 lb 13.6 oz (84.3 kg)     Nutrition Goal -- Pt to identify food quantities necessary to achieve weight loss of 6-24 lb at graduation from pulmonary rehab. Pt to identify food quantities necessary to achieve weight loss of 6-24 lb at graduation from pulmonary rehab.     Comment -- -- No change as patient has been absent since 11/9.           Personal Goal #2 Re-Evaluation   Personal Goal #2 -- Pt to build a healthy plate including vegetables, fruits, whole grains, and low-fat dairy products in a heart healthy meal plan. Pt to build a healthy plate including vegetables, fruits, whole grains, and low-fat dairy products in a heart healthy meal plan.            Nutrition Goals Discharge (Final Nutrition Goals Re-Evaluation):  Nutrition Goals Re-Evaluation - 09/04/20 1342      Goals   Current Weight 185 lb 13.6 oz (84.3 kg)    Nutrition Goal Pt to identify food quantities necessary to achieve weight loss of 6-24 lb at graduation from pulmonary rehab.    Comment No change as patient  has been absent since 11/9.      Personal Goal #2 Re-Evaluation   Personal Goal #2 Pt to build a healthy plate including vegetables,  fruits, whole grains, and low-fat dairy products in a heart healthy meal plan.           Psychosocial: Target Goals: Acknowledge presence or absence of significant depression and/or stress, maximize coping skills, provide positive support system. Participant is able to verbalize types and ability to use techniques and skills needed for reducing stress and depression.  Initial Review & Psychosocial Screening:  Initial Psych Review & Screening - 07/10/20 1356      Initial Review   Current issues with History of Depression      Family Dynamics   Good Support System? Yes    Comments cares for husband who has memory and physical issues      Barriers   Psychosocial barriers to participate in program The patient should benefit from training in stress management and relaxation.      Screening Interventions   Interventions Encouraged to exercise           Quality of Life Scores:  Scores of 19 and below usually indicate a poorer quality of life in these areas.  A difference of  2-3 points is a clinically meaningful difference.  A difference of 2-3 points in the total score of the Quality of Life Index has been associated with significant improvement in overall quality of life, self-image, physical symptoms, and general health in studies assessing change in quality of life.  PHQ-9: Recent Review Flowsheet Data    Depression screen [facility] 2/9 07/10/2020   Decreased Interest 0   Down, Depressed, Hopeless 0   PHQ - 2 Score 0   Altered sleeping 0   Tired, decreased energy 1   Change in appetite 0   Feeling bad or failure about yourself  0   Trouble concentrating 1   Moving slowly or fidgety/restless 0   Suicidal thoughts 0   PHQ-9 Score 2   Difficult doing work/chores Not difficult at all     Interpretation of Total Score  Total Score Depression Severity:  1-4 = Minimal depression, 5-9 = Mild depression, 10-14 = Moderate depression, 15-19 = Moderately severe depression, 20-27 =  Severe depression   Psychosocial Evaluation and Intervention:  Psychosocial Evaluation - 07/19/20 1154      Psychosocial Evaluation & Interventions   Expected Outcomes Otilia will continue to have good managment of her depression both medically and counseling support           Psychosocial Re-Evaluation:  Psychosocial Re-Evaluation    Row Name 07/19/20 1153 07/19/20 1154 08/14/20 1403 08/31/20 1127       Psychosocial Re-Evaluation   Current issues with History of Depression -- History of Depression;Current Stress Concerns History of Depression;Current Stress Concerns    Comments Areeba has completed 1 exercise session and displays healthy positive outlook -- History of depression which is presently stable, she takes care of her husband who has dementia. Has been absent from program for 4 weeks due to being on vacation and getting a cardiac w/u due to chest pain.    Expected Outcomes -- Anabela will continue to have good managment of her depression both medically and counseling support.  Yesennia will be free of any psychosocial barriers. That Elanora handles her stress in healthy ways. For her to be free of psychosocial concerns while participating in pulmonary rehab.    Interventions -- Encouraged to attend  Pulmonary Rehabilitation for the exercise Encouraged to attend Pulmonary Rehabilitation for the exercise;Stress management education --    Continue Psychosocial Services  -- Follow up required by staff No Follow up required Follow up required by staff         Initial Review   Source of Stress Concerns -- -- Family Family           Psychosocial Discharge (Final Psychosocial Re-Evaluation):  Psychosocial Re-Evaluation - 08/31/20 1127      Psychosocial Re-Evaluation   Current issues with History of Depression;Current Stress Concerns    Comments Has been absent from program for 4 weeks due to being on vacation and getting a cardiac w/u due to chest pain.    Expected Outcomes For her to  be free of psychosocial concerns while participating in pulmonary rehab.    Continue Psychosocial Services  Follow up required by staff      Initial Review   Source of Stress Concerns Family           Education: Education Goals: Education classes will be provided on a weekly basis, covering required topics. Participant will state understanding/return demonstration of topics presented.  Learning Barriers/Preferences:  Learning Barriers/Preferences - 07/10/20 1403      Learning Barriers/Preferences   Learning Barriers Sight    Learning Preferences Written Material;Group Instruction;Individual Instruction           Education Topics: Risk Factor Reduction:  -Group instruction that is supported by a PowerPoint presentation. Instructor discusses the definition of a risk factor, different risk factors for pulmonary disease, and how the heart and lungs work together.   Flowsheet Row PULMONARY REHAB OTHER RESPIRATORY from 07/27/2020 in [place]  Date 07/20/20  Educator Handout  Instruction Review Code 1- Verbalizes Understanding      Nutrition for Pulmonary Patient:  -Group instruction provided by PowerPoint slides, verbal discussion, and written materials to support subject matter. The instructor gives an explanation and review of healthy diet recommendations, which includes a discussion on weight management, recommendations for fruit and vegetable consumption, as well as protein, fluid, caffeine, fiber, sodium, sugar, and alcohol. Tips for eating when patients are short of breath are discussed.   Pursed Lip Breathing:  -Group instruction that is supported by demonstration and informational handouts. Instructor discusses the benefits of pursed lip and diaphragmatic breathing and detailed demonstration on how to preform both.     Oxygen Safety:  -Group instruction provided by PowerPoint, verbal discussion, and written material to support subject  matter. There is an overview of "What is Oxygen" and "Why do we need it".  Instructor also reviews how to create a safe environment for oxygen use, the importance of using oxygen as prescribed, and the risks of noncompliance. There is a brief discussion on traveling with oxygen and resources the patient may utilize.   Oxygen Equipment:  -Group instruction provided by [facility] Staff utilizing handouts, written materials, and equipment demonstrations.   Signs and Symptoms:  -Group instruction provided by written material and verbal discussion to support subject matter. Warning signs and symptoms of infection, stroke, and heart attack are reviewed and when to call the physician/911 reinforced. Tips for preventing the spread of infection discussed.   Advanced Directives:  -Group instruction provided by verbal instruction and written material to support subject matter. Instructor reviews Advanced Directive laws and proper instruction for filling out document.   Pulmonary Video:  -Group video education that reviews the importance of medication and oxygen compliance,  exercise, good nutrition, pulmonary hygiene, and pursed lip and diaphragmatic breathing for the pulmonary patient.   Exercise for the Pulmonary Patient:  -Group instruction that is supported by a PowerPoint presentation. Instructor discusses benefits of exercise, core components of exercise, frequency, duration, and intensity of an exercise routine, importance of utilizing pulse oximetry during exercise, safety while exercising, and options of places to exercise outside of rehab.   Flowsheet Row PULMONARY REHAB OTHER RESPIRATORY from 07/27/2020 in [place]  Date 07/27/20  Educator handout      Pulmonary Medications:  -Verbally interactive group education provided by instructor with focus on inhaled medications and proper administration.   Anatomy and Physiology of the Respiratory System and  Intimacy:  -Group instruction provided by PowerPoint, verbal discussion, and written material to support subject matter. Instructor reviews respiratory cycle and anatomical components of the respiratory system and their functions. Instructor also reviews differences in obstructive and restrictive respiratory diseases with examples of each. Intimacy, Sex, and Sexuality differences are reviewed with a discussion on how relationships can change when diagnosed with pulmonary disease. Common sexual concerns are reviewed.   MD DAY -A group question and answer session with a medical doctor that allows participants to ask questions that relate to their pulmonary disease state.   OTHER EDUCATION -Group or individual verbal, written, or video instructions that support the educational goals of the pulmonary rehab program.   Holiday Eating Survival Tips:  -Group instruction provided by PowerPoint slides, verbal discussion, and written materials to support subject matter. The instructor gives patients tips, tricks, and techniques to help them not only survive but enjoy the holidays despite the onslaught of food that accompanies the holidays.   Knowledge Questionnaire Score:  Knowledge Questionnaire Score - 07/10/20 1630      Knowledge Questionnaire Score   Pre Score 17/18           Core Components/Risk Factors/Patient Goals at Admission:  Personal Goals and Risk Factors at Admission - 07/10/20 1408      Core Components/Risk Factors/Patient Goals on Admission   Goal Weight: Short Term 178 lb (80.7 kg)    Goal Weight: Long Term 160 lb (72.6 kg)    Expected Outcomes Short Term: Continue to assess and modify interventions until short term weight is achieved;Long Term: Adherence to nutrition and physical activity/exercise program aimed toward attainment of established weight goal;Weight Loss: Understanding of general recommendations for a balanced deficit meal plan, which promotes 1-2 lb weight loss  per week and includes a negative energy balance of [phone] kcal/d;Understanding recommendations for meals to include 15-35% energy as protein, 25-35% energy from fat, 35-60% energy from carbohydrates, less than 200mg  of dietary cholesterol, 20-35 gm of total fiber daily;Understanding of distribution of calorie intake throughout the day with the consumption of 4-5 meals/snacks    Improve shortness of breath with ADL's Yes    Intervention Provide education, individualized exercise plan and daily activity instruction to help decrease symptoms of SOB with activities of daily living.    Expected Outcomes Short Term: Improve cardiorespiratory fitness to achieve a reduction of symptoms when performing ADLs;Long Term: Be able to perform more ADLs without symptoms or delay the onset of symptoms    Hypertension Yes    Intervention Provide education on lifestyle modifcations including regular physical activity/exercise, weight management, moderate sodium restriction and increased consumption of fresh fruit, vegetables, and low fat dairy, alcohol moderation, and smoking cessation.;Monitor prescription use compliance.    Expected Outcomes Short Term: Continued  assessment and intervention until BP is < 140/77mm HG in hypertensive participants. < 130/52mm HG in hypertensive participants with diabetes, heart failure or chronic kidney disease.;Long Term: Maintenance of blood pressure at goal levels.    Lipids Yes    Intervention Provide education and support for participant on nutrition & aerobic/resistive exercise along with prescribed medications to achieve LDL 70mg , HDL >40mg .    Expected Outcomes Short Term: Participant states understanding of desired cholesterol values and is compliant with medications prescribed. Participant is following exercise prescription and nutrition guidelines.;Long Term: Cholesterol controlled with medications as prescribed, with individualized exercise RX and with personalized nutrition  plan. Value goals: LDL < 70mg , HDL > 40 mg.           Core Components/Risk Factors/Patient Goals Review:   Goals and Risk Factor Review    Row Name 07/19/20 1155 08/14/20 1405 08/31/20 1129         Core Components/Risk Factors/Patient Goals Review   Personal Goals Review Weight Management/Obesity;Improve shortness of breath with ADL's;Hypertension;Lipids;Develop more efficient breathing techniques such as purse lipped breathing and diaphragmatic breathing and practicing self-pacing with activity.;Increase knowledge of respiratory medications and ability to use respiratory devices properly. Improve shortness of breath with ADL's;Develop more efficient breathing techniques such as purse lipped breathing and diaphragmatic breathing and practicing self-pacing with activity.;Increase knowledge of respiratory medications and ability to use respiratory devices properly. Weight Management/Obesity;Develop more efficient breathing techniques such as purse lipped breathing and diaphragmatic breathing and practicing self-pacing with activity.;Increase knowledge of respiratory medications and ability to use respiratory devices properly.;Improve shortness of breath with ADL's     Review Maize has completed 1 exercise session.  Not enough information to assess progress toward admission goals. Barbar just started pulmonary rehab, has attended 5 exercises sessions, then she was on vacation for 10 days.  She will return to exercise tomorrow and will continue increasing her exercise workloads. Has been absent for 4 weeks, hopefully will be cleared by cardiology after 09/05/2020 appointment.     Expected Outcomes See Admission goals That Tekila will see much improvement in her strength, stamina, and shortness of breath in the next full 30 days. Will start exercising with pulmonary rehab after cleared from a cardiology standpoint.            Core Components/Risk Factors/Patient Goals at Discharge (Final Review):    Goals and Risk Factor Review - 08/31/20 1129      Core Components/Risk Factors/Patient Goals Review   Personal Goals Review Weight Management/Obesity;Develop more efficient breathing techniques such as purse lipped breathing and diaphragmatic breathing and practicing self-pacing with activity.;Increase knowledge of respiratory medications and ability to use respiratory devices properly.;Improve shortness of breath with ADL's    Review Has been absent for 4 weeks, hopefully will be cleared by cardiology after 09/05/2020 appointment.    Expected Outcomes Will start exercising with pulmonary rehab after cleared from a cardiology standpoint.           ITP Comments:   Comments: ITP REVIEW Pt is making expected progress toward pulmonary rehab goals after completing 5 sessions. Recommend continued exercise, life style modification, education, and utilization of breathing techniques to increase stamina and strength and decrease shortness of breath with exertion.

## 2020-09-07 ENCOUNTER — Encounter (HOSPITAL_COMMUNITY): Admission: RE | Admit: 2020-09-07 | Payer: Medicare Other | Source: Ambulatory Visit

## 2020-09-07 ENCOUNTER — Telehealth (HOSPITAL_COMMUNITY): Payer: Self-pay | Admitting: *Deleted

## 2020-09-11 ENCOUNTER — Ambulatory Visit (INDEPENDENT_AMBULATORY_CARE_PROVIDER_SITE_OTHER): Payer: Medicare Other | Admitting: Podiatry

## 2020-09-11 ENCOUNTER — Telehealth: Payer: Self-pay

## 2020-09-11 ENCOUNTER — Ambulatory Visit (INDEPENDENT_AMBULATORY_CARE_PROVIDER_SITE_OTHER): Payer: Medicare Other

## 2020-09-11 ENCOUNTER — Other Ambulatory Visit: Payer: Self-pay

## 2020-09-11 ENCOUNTER — Encounter: Payer: Self-pay | Admitting: Podiatry

## 2020-09-11 ENCOUNTER — Other Ambulatory Visit: Payer: Self-pay | Admitting: Neurology

## 2020-09-11 DIAGNOSIS — R52 Pain, unspecified: Secondary | ICD-10-CM

## 2020-09-11 DIAGNOSIS — D169 Benign neoplasm of bone and articular cartilage, unspecified: Secondary | ICD-10-CM

## 2020-09-11 DIAGNOSIS — M2041 Other hammer toe(s) (acquired), right foot: Secondary | ICD-10-CM | POA: Diagnosis not present

## 2020-09-11 DIAGNOSIS — L84 Corns and callosities: Secondary | ICD-10-CM

## 2020-09-11 DIAGNOSIS — I6300 Cerebral infarction due to thrombosis of unspecified precerebral artery: Secondary | ICD-10-CM

## 2020-09-11 NOTE — Telephone Encounter (Signed)
ERROR

## 2020-09-11 NOTE — Progress Notes (Signed)
Subjective:   Patient ID: Shelley Spencer, female   DOB: 74 y.o.   MRN: [id number]   HPI Patient presents stating that she has this chronic pain between her third and fourth toes on the right foot and has a lesion on the fourth toe right that is painful and makes wearing shoe gear difficult.  States the toe is been rotated for a long time   Review of Systems  All other systems reviewed and are negative.       Objective:  Physical Exam Vitals and nursing note reviewed.  Constitutional:      Appearance: She is well-developed and well-nourished.  Cardiovascular:     Pulses: Intact distal pulses.  Pulmonary:     Effort: Pulmonary effort is normal.  Musculoskeletal:        General: Normal range of motion.  Skin:    General: Skin is warm.  Neurological:     Mental Status: She is alert.     Neurovascular status intact with patient found to have a distal rotation digit 4 right with distal medial keratotic lesion on the toe that is painful when pressed and making shoe gear difficult.  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  Today I did debride the lesion I allowed her to read consent form going over the procedure and all possible complications as outlined and she is willing to accept the risk of surgery and after extensive review signed consent form and is scheduled for outpatient surgery.   Patient is encouraged to call with questions or concerns which may arise  X-rays indicate there is significant rotation digit 4 right pressing against digit 3 right with exostotic lesion distal medial aspect digit 4 right

## 2020-09-12 ENCOUNTER — Encounter (HOSPITAL_COMMUNITY)
Admission: RE | Admit: 2020-09-12 | Discharge: 2020-09-12 | Disposition: A | Discharge status: Home / Self Care | Payer: Medicare Other | Source: Ambulatory Visit | Attending: Cardiology | Admitting: Cardiology

## 2020-09-12 ENCOUNTER — Ambulatory Visit (HOSPITAL_COMMUNITY)
Admission: RE | Admit: 2020-09-12 | Discharge: 2020-09-12 | Disposition: A | Discharge status: Home / Self Care | Payer: Self-pay | Source: Ambulatory Visit | Attending: Neurology | Admitting: Neurology

## 2020-09-12 DIAGNOSIS — J45909 Unspecified asthma, uncomplicated: Secondary | ICD-10-CM | POA: Diagnosis present

## 2020-09-12 DIAGNOSIS — I6300 Cerebral infarction due to thrombosis of unspecified precerebral artery: Secondary | ICD-10-CM | POA: Insufficient documentation

## 2020-09-12 LAB — LIPID PANEL WITH LDL/HDL RATIO
Cholesterol, Total: 148 mg/dL (ref 100–199)
HDL: 66 mg/dL (ref >39)
LDL Chol Calc (NIH): 63 mg/dL (ref 0–99)
LDL/HDL Ratio: 1 ratio (ref 0.0–3.2)
Triglycerides: 103 mg/dL (ref 0–149)
VLDL Cholesterol Cal: 19 mg/dL (ref 5–40)

## 2020-09-12 NOTE — Progress Notes (Signed)
Daily Session Note  Patient Details  Name: Shelley Spencer MRN: [id number] Date of Birth: [birth year] Referring Provider:   April Manson Pulmonary Rehab Walk Test from 07/10/2020 in [place]  Referring Provider Dr. Letta Median      Encounter Date: 09/12/2020  Check In:  Session Check In - 09/12/20 1446      Check-In   Supervising physician immediately available to respond to emergencies [facility] immediately available    Physician(s) Dr. Cruzita Lederer    Location [facility]    Staff Present Rosebud Poles, RN, BSN;Lisa Ysidro Evert, RN;Kenly Henckel Hassell Done, MS, ACSM-CEP, Exercise Physiologist    Virtual Visit No    Medication changes reported     No    Fall or balance concerns reported    No    Tobacco Cessation No Change    Warm-up and Cool-down Performed on first and last piece of equipment    Resistance Training Performed Yes    VAD Patient? No    PAD/SET Patient? No      Pain Assessment   Currently in Pain? No/denies    Pain Score 0-No pain    Multiple Pain Sites No           Capillary Blood Glucose: No results found for this or any previous visit (from the past 24 hour(s)).    Social History   Tobacco Use  Smoking Status Former Smoker  . Packs/day: 1.00  . Years: 20.00  . Pack years: 20.00  . Types: Cigarettes  . Quit date: 09/24/1979  . Years since quitting: 40.9  Smokeless Tobacco Never Used    Goals Met:  Proper associated with RPD/PD & O2 Sat Exercise tolerated well No report of cardiac concerns or symptoms Strength training completed today  Goals Unmet:  Not Applicable  Comments: Service time is from 1400 to 1505    Dr. Fransico Him is Medical Director for Cardiac Rehab at [facility].

## 2020-09-12 NOTE — Progress Notes (Signed)
Spoke to patient regarding her results she is aware

## 2020-09-12 NOTE — Progress Notes (Signed)
Please inform patient lipids are well controlled. Will continue praluent.

## 2020-09-14 ENCOUNTER — Encounter (HOSPITAL_COMMUNITY)
Admission: RE | Admit: 2020-09-14 | Discharge: 2020-09-14 | Disposition: A | Discharge status: Home / Self Care | Payer: Medicare Other | Source: Ambulatory Visit | Attending: Cardiology | Admitting: Cardiology

## 2020-09-14 ENCOUNTER — Other Ambulatory Visit: Payer: Self-pay

## 2020-09-14 DIAGNOSIS — J45909 Unspecified asthma, uncomplicated: Secondary | ICD-10-CM

## 2020-09-14 NOTE — Progress Notes (Signed)
Daily Session Note  Patient Details  Name: MELANYE HIRALDO MRN: [id number] Date of Birth: November 01, 1945 Referring Provider:   April Manson Pulmonary Rehab Walk Test from 07/10/2020 in [place]  Referring Provider Dr. Letta Median      Encounter Date: 09/14/2020  Check In:  Session Check In - 09/14/20 1016      Check-In   Supervising physician immediately available to respond to emergencies [facility] immediately available    Physician(s) Dr. Cruzita Lederer    Location [facility]    Staff Present Rosebud Poles, RN, BSN;Lisa Ysidro Evert, RN;[name] Hassell Done, MS, ACSM-CEP, Exercise Physiologist    Virtual Visit No    Medication changes reported     No    Fall or balance concerns reported    No    Tobacco Cessation No Change    Warm-up and Cool-down Performed on first and last piece of equipment    Resistance Training Performed No    VAD Patient? No    PAD/SET Patient? No      Pain Assessment   Currently in Pain? No/denies    Pain Score 0-No pain    Multiple Pain Sites No           Capillary Blood Glucose: No results found for this or any previous visit (from the past 24 hour(s)).    Social History   Tobacco Use  Smoking Status Former Smoker  . Packs/day: 1.00  . Years: 20.00  . Pack years: 20.00  . Types: Cigarettes  . Quit date: 09/24/1979  . Years since quitting: 41.0  Smokeless Tobacco Never Used    Goals Met:  Proper associated with RPD/PD & O2 Sat Exercise tolerated well Strength training completed today  Goals Unmet:  Not Applicable  Comments: Service time is from 1000 to 1107    Dr. Fransico Him is Medical Director for Cardiac Rehab at [facility].

## 2020-09-19 ENCOUNTER — Encounter (HOSPITAL_COMMUNITY)
Admission: RE | Admit: 2020-09-19 | Discharge: 2020-09-19 | Disposition: A | Discharge status: Home / Self Care | Payer: Medicare Other | Source: Ambulatory Visit | Attending: Cardiology | Admitting: Cardiology

## 2020-09-19 ENCOUNTER — Other Ambulatory Visit: Payer: Self-pay | Admitting: Podiatry

## 2020-09-19 ENCOUNTER — Other Ambulatory Visit: Payer: Self-pay

## 2020-09-19 VITALS — Wt 189.2 lb

## 2020-09-19 DIAGNOSIS — J45909 Unspecified asthma, uncomplicated: Secondary | ICD-10-CM

## 2020-09-19 NOTE — Progress Notes (Signed)
Daily Session Note  Patient Details  Name: Shelley Spencer MRN: [id number] Date of Birth: May 21, 1946 Referring Provider:   April Manson Pulmonary Rehab Walk Test from 07/10/2020 in [place]  Referring Provider Dr. Letta Median      Encounter Date: 09/19/2020  Check In:  Session Check In - 09/19/20 1403      Check-In   Supervising physician immediately available to respond to emergencies [facility] immediately available    Physician(s) Dr. Alfredia Ferguson    Location [facility]    Staff Present Rosebud Poles, RN, BSN;Herron Fero Ysidro Evert, RN;[name] Hassell Done, MS, ACSM-CEP, Exercise Physiologist    Virtual Visit No    Medication changes reported     No    Fall or balance concerns reported    No    Tobacco Cessation No Change    Warm-up and Cool-down Performed on first and last piece of equipment    Resistance Training Performed Yes    VAD Patient? No    PAD/SET Patient? No      Pain Assessment   Currently in Pain? No/denies    Pain Score 0-No pain    Multiple Pain Sites No           Capillary Blood Glucose: No results found for this or any previous visit (from the past 24 hour(s)).   Exercise Prescription Changes - 09/19/20 1500      Response to Exercise   Blood Pressure (Admit) 140/80    Blood Pressure (Exercise) 144/64    Blood Pressure (Exit) 124/72    Heart Rate (Admit) 88 bpm    Heart Rate (Exercise) 110 bpm    Heart Rate (Exit) 90 bpm    Oxygen Saturation (Admit) 97 %    Oxygen Saturation (Exercise) 95 %    Oxygen Saturation (Exit) 96 %    Rating of Perceived Exertion (Exercise) 15    Perceived Dyspnea (Exercise) 2    Duration Progress to 45 minutes of aerobic exercise without signs/symptoms of physical distress    Intensity THRR unchanged      Progression   Progression Continue to progress workloads to maintain intensity without signs/symptoms of physical distress.      Resistance Training   Training Prescription Yes     Weight orange bands    Reps 10-15    Time 10 Minutes      Recumbant Bike   Level 2    Minutes 15    METs 2.4      NuStep   Level 3    SPM 80    Minutes 15    METs 2.2           Social History   Tobacco Use  Smoking Status Former Smoker  . Packs/day: 1.00  . Years: 20.00  . Pack years: 20.00  . Types: Cigarettes  . Quit date: 09/24/1979  . Years since quitting: 41.0  Smokeless Tobacco Never Used    Goals Met:  Exercise tolerated well No report of cardiac concerns or symptoms Strength training completed today  Goals Unmet:  Not Applicable  Comments: Service time is from 1400 to 1510    Dr. Fransico Him is Medical Director for Cardiac Rehab at [facility].

## 2020-09-19 NOTE — Progress Notes (Signed)
Spoke to patient

## 2020-09-21 ENCOUNTER — Encounter (HOSPITAL_COMMUNITY): Payer: Medicare Other

## 2020-09-26 ENCOUNTER — Other Ambulatory Visit: Payer: Self-pay

## 2020-09-26 ENCOUNTER — Encounter (HOSPITAL_COMMUNITY)
Admission: RE | Admit: 2020-09-26 | Discharge: 2020-09-26 | Disposition: A | Discharge status: Home / Self Care | Payer: Medicare Other | Source: Ambulatory Visit | Attending: Cardiology | Admitting: Cardiology

## 2020-09-26 DIAGNOSIS — J45909 Unspecified asthma, uncomplicated: Secondary | ICD-10-CM

## 2020-09-26 NOTE — Progress Notes (Signed)
Daily Session Note  Patient Details  Name: Shelley Spencer MRN: [id number] Date of Birth: 03/21/1946 Referring Provider:   Flowsheet Row Pulmonary Rehab Walk Test from 07/10/2020 in [facility]  Referring Provider Dr. Perinni      Encounter Date: 09/26/2020  Check In:  Session Check In - 09/26/20 1414      Check-In   Supervising physician immediately available to respond to emergencies [facility] immediately available    Physician(s) Dr. Powell    Location [facility]    Staff Present Joan Behrens, RN, BSN;Lisa Hughes, RN;[name] Martin, MS, ACSM-CEP, Exercise Physiologist    Virtual Visit No    Medication changes reported     No    Fall or balance concerns reported    No    Tobacco Cessation Use Decreased    Warm-up and Cool-down Performed on first and last piece of equipment    Resistance Training Performed Yes    VAD Patient? No    PAD/SET Patient? No      Pain Assessment   Currently in Pain? No/denies    Multiple Pain Sites No           Capillary Blood Glucose: No results found for this or any previous visit (from the past 24 hour(s)).    Social History   Tobacco Use  Smoking Status Former Smoker  . Packs/day: 1.00  . Years: 20.00  . Pack years: 20.00  . Types: Cigarettes  . Quit date: 09/24/1979  . Years since quitting: 41.0  Smokeless Tobacco Never Used    Goals Met:  Proper associated with RPD/PD & O2 Sat Exercise tolerated well Strength training completed today  Goals Unmet:  Not Applicable  Comments: Service time is from 1400 to 1500    Dr. Traci Turner is Medical Director for Cardiac Rehab at [facility]. 

## 2020-09-28 ENCOUNTER — Encounter (HOSPITAL_COMMUNITY)
Admission: RE | Admit: 2020-09-28 | Discharge: 2020-09-28 | Disposition: A | Discharge status: Home / Self Care | Payer: Medicare Other | Source: Ambulatory Visit | Attending: Cardiology | Admitting: Cardiology

## 2020-09-28 ENCOUNTER — Other Ambulatory Visit: Payer: Self-pay

## 2020-09-28 VITALS — Wt 188.9 lb

## 2020-09-28 DIAGNOSIS — J45909 Unspecified asthma, uncomplicated: Secondary | ICD-10-CM | POA: Diagnosis not present

## 2020-09-28 NOTE — Progress Notes (Signed)
Daily Session Note  Patient Details  Name: Shelley Spencer MRN: [id number] Date of Birth: [birth year] Referring Provider:   April Manson Pulmonary Rehab Walk Test from 07/10/2020 in [place]  Referring Provider Dr. Letta Median      Encounter Date: 09/28/2020  Check In:  Session Check In - 09/28/20 1424      Check-In   Supervising physician immediately available to respond to emergencies [facility] immediately available    Physician(s) Dr. Myer Peer    Location [facility]    Staff Present Rosebud Poles, RN, BSN;Roselyn Doby Ysidro Evert, RN;[name] Hassell Done, MS, ACSM-CEP, Exercise Physiologist    Virtual Visit No    Medication changes reported     No    Fall or balance concerns reported    No    Tobacco Cessation No Change    Warm-up and Cool-down Performed on first and last piece of equipment    Resistance Training Performed No    VAD Patient? No    PAD/SET Patient? No      Pain Assessment   Currently in Pain? No/denies    Multiple Pain Sites No           Capillary Blood Glucose: No results found for this or any previous visit (from the past 24 hour(s)).    Social History   Tobacco Use  Smoking Status Former Smoker  . Packs/day: 1.00  . Years: 20.00  . Pack years: 20.00  . Types: Cigarettes  . Quit date: 09/24/1979  . Years since quitting: 41.0  Smokeless Tobacco Never Used    Goals Met:  Exercise tolerated well No report of cardiac concerns or symptoms Strength training completed today  Goals Unmet:  Not Applicable  Comments: Service time is from 1400 to 1500    Dr. Fransico Him is Medical Director for Cardiac Rehab at [facility].

## 2020-09-28 NOTE — Progress Notes (Signed)
I have reviewed a Home Exercise Prescription with [name] . Deserae states she occasionally uses her resistance bands at home, but not on a regular basis.  The patient was advised to walk and use resistance bands 3-4  days a week for 30-45 minutes.  Cherylann Ratel and I discussed how to progress their exercise prescription.  The patient stated that their goals were maintain health and decrease shortness of breath.  The patient stated that they understand the exercise prescription.  We reviewed exercise guidelines, target heart rate during exercise, RPE Scale, weather conditions, rescue inhaler use, endpoints for exercise, warmup and cool down.  Patient is encouraged to come to me with any questions. I will continue to follow up with the patient to assist them with progression and safety.   Norris Cross MS, ACSM CEP 3:25 PM 09/28/2020

## 2020-10-03 ENCOUNTER — Encounter (HOSPITAL_COMMUNITY)
Admission: RE | Admit: 2020-10-03 | Discharge: 2020-10-03 | Disposition: A | Discharge status: Home / Self Care | Payer: Medicare Other | Source: Ambulatory Visit | Attending: Cardiology | Admitting: Cardiology

## 2020-10-03 DIAGNOSIS — J45909 Unspecified asthma, uncomplicated: Secondary | ICD-10-CM

## 2020-10-03 NOTE — Progress Notes (Signed)
Pulmonary Individual Treatment Plan  Patient Details  Name: Shelley Spencer MRN: [id number] Date of Birth: [birth year] Referring Provider:   April Manson Pulmonary Rehab Walk Test from 07/10/2020 in [place]  Referring Provider Dr. Letta Median      Initial Encounter Date:  Flowsheet Row Pulmonary Rehab Walk Test from 07/10/2020 in [place]  Date 07/10/20      Visit Diagnosis: Uncomplicated asthma, unspecified asthma severity, unspecified whether persistent  Patient's Home Medications on Admission:   Current Outpatient Medications:  .  acetaminophen (TYLENOL) 650 MG CR tablet, Take 1,300 mg by mouth every 8 (eight) hours as needed for pain. , Disp: , Rfl:  .  albuterol (VENTOLIN HFA) 108 (90 Base) MCG/ACT inhaler, Inhale 1-2 puffs into the lungs every 6 (six) hours as needed for wheezing or shortness of breath. , Disp: , Rfl:  .  Alirocumab (PRALUENT) 150 MG/ML SOAJ, Inject 1 Syringe into the skin every 14 (fourteen) days., Disp: 6 mL, Rfl: 3 .  aspirin EC 81 MG EC tablet, Take 1 tablet (81 mg total) by mouth daily. Swallow whole., Disp: 30 tablet, Rfl: 11 .  budesonide-formoterol (SYMBICORT) 160-4.5 MCG/ACT inhaler, Inhale 2 puffs into the lungs daily., Disp: , Rfl:  .  buPROPion (WELLBUTRIN XL) 150 MG 24 hr tablet, Take 150 mg by mouth daily., Disp: , Rfl:  .  CALCIUM-VITAMIN D PO, Take 1 tablet by mouth daily., Disp: , Rfl:  .  cetirizine (ZYRTEC) 10 MG tablet, Take 10 mg by mouth daily., Disp: , Rfl:  .  doxazosin (CARDURA) 4 MG tablet, Take 4 mg by mouth daily. , Disp: , Rfl:  .  furosemide (LASIX) 40 MG tablet, Take 40 mg by mouth daily., Disp: , Rfl:  .  Investigational - Study Medication, Take 1 tablet by mouth daily. Study name: [place] stroke study Additional study details: [id number] bottle: 5, 15, 25 mg or placebo, Disp: 1 each, Rfl: PRN .  Investigational - Study Medication, Take 1 tablet by mouth daily.  Study name: [place] stroke study Additional study details: [id number]/green bottle: 5, 15, 25 mg or placebo tablet (PI - Sethi), Disp: 1 each, Rfl: PRN .  isosorbide mononitrate (IMDUR) 30 MG 24 hr tablet, Take 1 tablet (30 mg total) by mouth daily., Disp: 90 tablet, Rfl: 3 .  Multiple Vitamins-Minerals (MULTIVITAMIN PO), Take 1 tablet by mouth daily., Disp: , Rfl:  .  nitroGLYCERIN (NITROSTAT) 0.4 MG SL tablet, Place 1 tablet (0.4 mg total) under the tongue every 5 (five) minutes as needed for chest pain., Disp: 30 tablet, Rfl: 2 .  olmesartan (BENICAR) 40 MG tablet, Take 40 mg by mouth every evening., Disp: , Rfl:  .  pantoprazole (PROTONIX) 40 MG tablet, Take 1 tablet (40 mg total) by mouth 2 (two) times daily. (Patient taking differently: Take 40 mg by mouth every evening.), Disp: 180 tablet, Rfl: 3 .  PARoxetine (PAXIL) 40 MG tablet, Take 40 mg by mouth every evening. , Disp: , Rfl:  .  potassium chloride SA (K-DUR) 20 MEQ tablet, Take 20 mEq by mouth daily. , Disp: , Rfl:  .  traZODone (DESYREL) 150 MG tablet, Take 150 mg by mouth at bedtime., Disp: , Rfl:  .  vitamin B-12 (CYANOCOBALAMIN) 1000 MCG tablet, Take 1,000 mcg by mouth daily., Disp: , Rfl:   Past Medical History: Past Medical History:  Diagnosis Date  . Anemia   . Anxiety   . Arthritis   .  Asthma   . Bicornuate uterus    double cervix  . CAD (coronary artery disease)   . Colon polyp   . Depression   . Deviated septum   . DJD (degenerative joint disease)    L-Spine  . Fibroids   . GERD (gastroesophageal reflux disease)   . Hematuria   . Hiatal hernia   . HNP (herniated nucleus pulposus)    lower back  . Hyperlipidemia   . Hypertension   . MVP (mitral valve prolapse)    pt denies, pt states she had a Echo ?2008 and it did not show MVP  . Pre-diabetes   . Retinal tear 2005   Small right retinal tear  . Shingles 1980's   on her waist    Tobacco Use: Social History   Tobacco Use  Smoking Status Former Smoker   . Packs/day: 1.00  . Years: 20.00  . Pack years: 20.00  . Types: Cigarettes  . Quit date: 09/24/1979  . Years since quitting: 41.0  Smokeless Tobacco Never Used    Labs: Recent Review Flowsheet Data    Labs for ITP Cardiac and Pulmonary Rehab Latest Ref Rng & Units 08/24/2008 09/02/2017 12/28/2019 03/15/2020 09/11/2020   Cholestrol 100 - 199 mg/dL - - 211(H) 160 148   LDLCALC 0 - 99 mg/dL - - 95 77 63   HDL >39 mg/dL - - 69 63 66   Trlycerides 0 - 149 mg/dL - - 282(H) 101 103   Hemoglobin A1c 4.8 - 5.6 % - 5.7(H) - 6.1(H) -   TCO2 0 - 100 mmol/L 27 - - - -      Capillary Blood Glucose: Lab Results  Component Value Date   GLUCAP 96 03/16/2020   GLUCAP 83 03/16/2020   GLUCAP 95 03/16/2020   GLUCAP 146 (H) 03/15/2020   GLUCAP 86 03/15/2020     Pulmonary Assessment Scores:  Pulmonary Assessment Scores    Row Name 07/10/20 1630         ADL UCSD   ADL Phase Entry     SOB Score total 46           CAT Score   CAT Score 17           mMRC Score   mMRC Score 3           UCSD: Self-administered rating of dyspnea associated with activities of daily living (ADLs) 6-point scale (0 = "not at all" to 5 = "maximal or unable to do because of breathlessness")  Scoring Scores range from 0 to 120.  Minimally important difference is 5 units  CAT: CAT can identify the health impairment of COPD patients and is better correlated with disease progression.  CAT has a scoring range of zero to 40. The CAT score is classified into four groups of low (less than 10), medium (10 - 20), high (21-30) and very high (31-40) based on the impact level of disease on health status. A CAT score over 10 suggests significant symptoms.  A worsening CAT score could be explained by an exacerbation, poor medication adherence, poor inhaler technique, or progression of COPD or comorbid conditions.  CAT MCID is 2 points  mMRC: mMRC (Modified Medical Research Council) Dyspnea Scale is used to assess the  degree of baseline functional disability in patients of respiratory disease due to dyspnea. No minimal important difference is established. A decrease in score of 1 point or greater is considered a positive change.   Pulmonary Function Assessment:  Exercise Target Goals: Exercise Program Goal: Individual exercise prescription set using results from initial 6 min walk test and THRR while considering  patient's activity barriers and safety.   Exercise Prescription Goal: Initial exercise prescription builds to 30-45 minutes a day of aerobic activity, 2-3 days per week.  Home exercise guidelines will be given to patient during program as part of exercise prescription that the participant will acknowledge.  Activity Barriers & Risk Stratification:  Activity Barriers & Cardiac Risk Stratification - 07/10/20 1343      Activity Barriers & Cardiac Risk Stratification   Activity Barriers Arthritis;Back Problems;Balance Concerns;History of Falls;Shortness of Breath;Joint Problems;Deconditioning           6 Minute Walk:  6 Minute Walk    Row Name 07/10/20 1622         6 Minute Walk   Phase Initial     Distance 1188 feet     Walk Time 6 minutes     # of Rest Breaks 1  stopped at 5 minutes and 40 seconds due to hip pain scale 5/10     MPH 2.25     METS 2.22     RPE 13     Perceived Dyspnea  2     VO2 Peak 7.76     Symptoms Yes (comment)     Comments Hip pain 5/10     Resting HR 84 bpm     Resting BP 110/56     Resting Oxygen Saturation  94 %     Exercise Oxygen Saturation  during 6 min walk 82 %     Max Ex. HR 113 bpm     Max Ex. BP 120/68     2 Minute Post BP 110/60           Interval HR   1 Minute HR 95     2 Minute HR 92     3 Minute HR 111     4 Minute HR 111     5 Minute HR 113     6 Minute HR 102     2 Minute Post HR 93     Interval Heart Rate? Yes           Interval Oxygen   Interval Oxygen? Yes     Baseline Oxygen Saturation % 94 %     1 Minute Oxygen  Saturation % 86 %     1 Minute Liters of Oxygen 0 L     2 Minute Oxygen Saturation % 82 %     2 Minute Liters of Oxygen 0 L     3 Minute Oxygen Saturation % 97 %     3 Minute Liters of Oxygen 0 L     4 Minute Oxygen Saturation % 97 %     4 Minute Liters of Oxygen 0 L     5 Minute Oxygen Saturation % 97 %     5 Minute Liters of Oxygen 0 L     6 Minute Oxygen Saturation % 98 %     6 Minute Liters of Oxygen 0 L     2 Minute Post Oxygen Saturation % 99 %     2 Minute Post Liters of Oxygen 0 L            Oxygen Initial Assessment:  Oxygen Initial Assessment - 07/10/20 1620      Home Oxygen   Home Oxygen Device None    Sleep Oxygen Prescription None  Home Exercise Oxygen Prescription None    Home Resting Oxygen Prescription None      Initial 6 min Walk   Oxygen Used None      Program Oxygen Prescription   Program Oxygen Prescription None      Intervention   Short Term Goals To learn and exhibit compliance with exercise, home and travel O2 prescription;To learn and understand importance of maintaining oxygen saturations>88%;To learn and demonstrate proper use of respiratory medications;To learn and understand importance of monitoring SPO2 with pulse oximeter and demonstrate accurate use of the pulse oximeter.;To learn and demonstrate proper pursed lip breathing techniques or other breathing techniques.    Long  Term Goals Exhibits compliance with exercise, home and travel O2 prescription;Verbalizes importance of monitoring SPO2 with pulse oximeter and return demonstration;Maintenance of O2 saturations>88%;Exhibits proper breathing techniques, such as pursed lip breathing or other method taught during program session;Compliance with respiratory medication;Demonstrates proper use of MDI's           Oxygen Re-Evaluation:  Oxygen Re-Evaluation    Row Name 07/20/20 0815 08/10/20 0813 09/05/20 0844 10/03/20 0806       Program Oxygen Prescription   Program Oxygen Prescription  None None None None         Home Oxygen   Home Oxygen Device None None None None    Sleep Oxygen Prescription None None None None    Home Exercise Oxygen Prescription None None None None    Home Resting Oxygen Prescription None None None None         Goals/Expected Outcomes   Short Term Goals To learn and exhibit compliance with exercise, home and travel O2 prescription;To learn and understand importance of maintaining oxygen saturations>88%;To learn and demonstrate proper use of respiratory medications;To learn and understand importance of monitoring SPO2 with pulse oximeter and demonstrate accurate use of the pulse oximeter.;To learn and demonstrate proper pursed lip breathing techniques or other breathing techniques. To learn and exhibit compliance with exercise, home and travel O2 prescription;To learn and understand importance of maintaining oxygen saturations>88%;To learn and demonstrate proper use of respiratory medications;To learn and understand importance of monitoring SPO2 with pulse oximeter and demonstrate accurate use of the pulse oximeter.;To learn and demonstrate proper pursed lip breathing techniques or other breathing techniques. To learn and exhibit compliance with exercise, home and travel O2 prescription;To learn and understand importance of maintaining oxygen saturations>88%;To learn and demonstrate proper use of respiratory medications;To learn and understand importance of monitoring SPO2 with pulse oximeter and demonstrate accurate use of the pulse oximeter.;To learn and demonstrate proper pursed lip breathing techniques or other breathing techniques. To learn and exhibit compliance with exercise, home and travel O2 prescription;To learn and understand importance of maintaining oxygen saturations>88%;To learn and demonstrate proper use of respiratory medications;To learn and understand importance of monitoring SPO2 with pulse oximeter and demonstrate accurate use of the pulse  oximeter.;To learn and demonstrate proper pursed lip breathing techniques or other breathing techniques.    Long  Term Goals Exhibits compliance with exercise, home and travel O2 prescription;Verbalizes importance of monitoring SPO2 with pulse oximeter and return demonstration;Maintenance of O2 saturations>88%;Exhibits proper breathing techniques, such as pursed lip breathing or other method taught during program session;Compliance with respiratory medication;Demonstrates proper use of MDI's Exhibits compliance with exercise, home and travel O2 prescription;Verbalizes importance of monitoring SPO2 with pulse oximeter and return demonstration;Maintenance of O2 saturations>88%;Exhibits proper breathing techniques, such as pursed lip breathing or other method taught during program session;Compliance with respiratory medication;Demonstrates proper use  of MDI's Exhibits compliance with exercise, home and travel O2 prescription;Verbalizes importance of monitoring SPO2 with pulse oximeter and return demonstration;Maintenance of O2 saturations>88%;Exhibits proper breathing techniques, such as pursed lip breathing or other method taught during program session;Compliance with respiratory medication;Demonstrates proper use of MDI's Exhibits compliance with exercise, home and travel O2 prescription;Verbalizes importance of monitoring SPO2 with pulse oximeter and return demonstration;Maintenance of O2 saturations>88%;Exhibits proper breathing techniques, such as pursed lip breathing or other method taught during program session;Compliance with respiratory medication;Demonstrates proper use of MDI's    Goals/Expected Outcomes compliance and understanding of oxygen saturation and pursed lip breathing compliance and understanding of oxygen saturation and pursed lip breathing compliance and understanding of oxygen saturation and pursed lip breathing compliance and understanding of oxygen saturation and pursed lip breathing            Oxygen Discharge (Final Oxygen Re-Evaluation):  Oxygen Re-Evaluation - 10/03/20 0806      Program Oxygen Prescription   Program Oxygen Prescription None      Home Oxygen   Home Oxygen Device None    Sleep Oxygen Prescription None    Home Exercise Oxygen Prescription None    Home Resting Oxygen Prescription None      Goals/Expected Outcomes   Short Term Goals To learn and exhibit compliance with exercise, home and travel O2 prescription;To learn and understand importance of maintaining oxygen saturations>88%;To learn and demonstrate proper use of respiratory medications;To learn and understand importance of monitoring SPO2 with pulse oximeter and demonstrate accurate use of the pulse oximeter.;To learn and demonstrate proper pursed lip breathing techniques or other breathing techniques.    Long  Term Goals Exhibits compliance with exercise, home and travel O2 prescription;Verbalizes importance of monitoring SPO2 with pulse oximeter and return demonstration;Maintenance of O2 saturations>88%;Exhibits proper breathing techniques, such as pursed lip breathing or other method taught during program session;Compliance with respiratory medication;Demonstrates proper use of MDI's    Goals/Expected Outcomes compliance and understanding of oxygen saturation and pursed lip breathing           Initial Exercise Prescription:  Initial Exercise Prescription - 07/13/20 0700      Date of Initial Exercise RX and Referring Provider   Date 07/10/20    Referring Provider Dr. Letta Median    Expected Discharge Date 09/14/20      Recumbant Bike   Level 1.5    Minutes 15      NuStep   Level 2    Minutes 15      Prescription Details   Frequency (times per week) 2    Duration Progress to 30 minutes of continuous aerobic without signs/symptoms of physical distress      Intensity   THRR 40-80% of Max Heartrate 59-118    Ratings of Perceived Exertion 11-13    Perceived Dyspnea 0-4       Progression   Progression Continue to progress workloads to maintain intensity without signs/symptoms of physical distress.      Resistance Training   Training Prescription Yes    Weight orange bands    Reps 10-15           Perform Capillary Blood Glucose checks as needed.  Exercise Prescription Changes:  Exercise Prescription Changes    Row Name 07/20/20 0800 07/25/20 1400 08/01/20 0809 09/19/20 1500 09/28/20 1500     Response to Exercise   Blood Pressure (Admit) 140/72 132/70 140/72 140/80 -   Blood Pressure (Exercise) 140/72 122/70 - 144/64 -   Blood Pressure (Exit)  114/68 114/66 134/61 124/72 -   Heart Rate (Admit) 83 bpm 85 bpm 98 bpm 88 bpm -   Heart Rate (Exercise) 107 bpm 120 bpm 110 bpm 110 bpm -   Heart Rate (Exit) 96 bpm 85 bpm 109 bpm 90 bpm -   Oxygen Saturation (Admit) 97 % 97 % 96 % 97 % -   Oxygen Saturation (Exercise) 97 % 88 % 95 % 95 % -   Oxygen Saturation (Exit) 96 % 97 % 96 % 96 % -   Rating of Perceived Exertion (Exercise) 13 13 13 15  -   Perceived Dyspnea (Exercise) 1 2 1 2  -   Duration Continue with 30 min of aerobic exercise without signs/symptoms of physical distress. Continue with 30 min of aerobic exercise without signs/symptoms of physical distress. Continue with 30 min of aerobic exercise without signs/symptoms of physical distress. Progress to 45 minutes of aerobic exercise without signs/symptoms of physical distress -   Intensity Other (comment)  40%-80% HR Max THRR unchanged THRR unchanged THRR unchanged -     Progression   Progression Continue to progress workloads to maintain intensity without signs/symptoms of physical distress. Continue to progress workloads to maintain intensity without signs/symptoms of physical distress. Continue to progress workloads to maintain intensity without signs/symptoms of physical distress. Continue to progress workloads to maintain intensity without signs/symptoms of physical distress. -   Average METs 2 - 2.3 - -      Resistance Training   Training Prescription Yes Yes Yes Yes -   Weight orange bands orange bands orange bands orange bands -   Reps 10-15 10-15 10-15 10-15 -   Time 10 Minutes 10 Minutes 10 Minutes 10 Minutes -     Recumbant Bike   Level 1.5 1.5 1.5 2 -   Minutes 15 15 15 15  -   METs 2.3 2.5 2.3 2.4 -     NuStep   Level 2 2 3 3  -   SPM 80 80 80 80 -   Minutes 15 15 15 15  -   METs 1.9 2 2  2.2 -     Home Exercise Plan   Plans to continue exercise at - - - - Home (comment)  Walking and resistance bands   Frequency - - - - Add 2 additional days to program exercise sessions.   Initial Home Exercises Provided - - - - 09/28/20          Exercise Comments:  Exercise Comments    Row Name 07/18/20 0810 09/28/20 1519 09/28/20 1527       Exercise Comments Pt completed first day of exercise and tolerated well with no complaints or concerns. Completed home exercise with patient. Patient states she occassionaly does the resistance bands at home but not consistently. Talked about adding at least 1-2 days outside of rehab. Patient was receptive. Completed home exercise with patient. Patient states she occassionally does the resistance bands at home but not consistently. Talked about adding at least 1-2 days outside of rehab. Patient was receptive.            Exercise Goals and Review:  Exercise Goals    Row Name 07/10/20 1344 07/10/20 1621           Exercise Goals   Increase Physical Activity Yes Yes      Intervention Provide advice, education, support and counseling about physical activity/exercise needs.;Develop an individualized exercise prescription for aerobic and resistive training based on initial evaluation findings, risk stratification, comorbidities and  participant's personal goals. Provide advice, education, support and counseling about physical activity/exercise needs.;Develop an individualized exercise prescription for aerobic and resistive training based on initial  evaluation findings, risk stratification, comorbidities and participant's personal goals.      Expected Outcomes Short Term: Attend rehab on a regular basis to increase amount of physical activity.;Long Term: Exercising regularly at least 3-5 days a week.;Long Term: Add in home exercise to make exercise part of routine and to increase amount of physical activity. Short Term: Attend rehab on a regular basis to increase amount of physical activity.;Long Term: Exercising regularly at least 3-5 days a week.;Long Term: Add in home exercise to make exercise part of routine and to increase amount of physical activity.      Increase Strength and Stamina Yes Yes      Intervention Provide advice, education, support and counseling about physical activity/exercise needs.;Develop an individualized exercise prescription for aerobic and resistive training based on initial evaluation findings, risk stratification, comorbidities and participant's personal goals. Provide advice, education, support and counseling about physical activity/exercise needs.;Develop an individualized exercise prescription for aerobic and resistive training based on initial evaluation findings, risk stratification, comorbidities and participant's personal goals.      Expected Outcomes Short Term: Increase workloads from initial exercise prescription for resistance, speed, and METs.;Short Term: Perform resistance training exercises routinely during rehab and add in resistance training at home;Long Term: Improve cardiorespiratory fitness, muscular endurance and strength as measured by increased METs and functional capacity (6MWT) Short Term: Increase workloads from initial exercise prescription for resistance, speed, and METs.;Short Term: Perform resistance training exercises routinely during rehab and add in resistance training at home;Long Term: Improve cardiorespiratory fitness, muscular endurance and strength as measured by increased METs and  functional capacity (6MWT)      Able to understand and use rate of perceived exertion (RPE) scale Yes Yes      Intervention Provide education and explanation on how to use RPE scale Provide education and explanation on how to use RPE scale      Expected Outcomes Short Term: Able to use RPE daily in rehab to express subjective intensity level;Long Term:  Able to use RPE to guide intensity level when exercising independently Short Term: Able to use RPE daily in rehab to express subjective intensity level;Long Term:  Able to use RPE to guide intensity level when exercising independently      Able to understand and use Dyspnea scale Yes Yes      Intervention Provide education and explanation on how to use Dyspnea scale Provide education and explanation on how to use Dyspnea scale      Expected Outcomes Short Term: Able to use Dyspnea scale daily in rehab to express subjective sense of shortness of breath during exertion;Long Term: Able to use Dyspnea scale to guide intensity level when exercising independently Short Term: Able to use Dyspnea scale daily in rehab to express subjective sense of shortness of breath during exertion;Long Term: Able to use Dyspnea scale to guide intensity level when exercising independently      Knowledge and understanding of Target Heart Rate Range (THRR) Yes Yes      Intervention Provide education and explanation of THRR including how the numbers were predicted and where they are located for reference Provide education and explanation of THRR including how the numbers were predicted and where they are located for reference      Expected Outcomes Short Term: Able to state/look up THRR;Long Term: Able to use THRR to govern  intensity when exercising independently;Short Term: Able to use daily as guideline for intensity in rehab Short Term: Able to state/look up THRR;Long Term: Able to use THRR to govern intensity when exercising independently;Short Term: Able to use daily as guideline  for intensity in rehab      Understanding of Exercise Prescription Yes Yes      Intervention Provide education, explanation, and written materials on patient's individual exercise prescription Provide education, explanation, and written materials on patient's individual exercise prescription      Expected Outcomes Short Term: Able to explain program exercise prescription;Long Term: Able to explain home exercise prescription to exercise independently Short Term: Able to explain program exercise prescription;Long Term: Able to explain home exercise prescription to exercise independently             Exercise Goals Re-Evaluation :  Exercise Goals Re-Evaluation    Row Name 07/20/20 0814 08/10/20 0812 09/05/20 0841 10/03/20 0807       Exercise Goal Re-Evaluation   Exercise Goals Review Increase Physical Activity;Increase Strength and Stamina;Able to understand and use rate of perceived exertion (RPE) scale;Able to understand and use Dyspnea scale;Knowledge and understanding of Target Heart Rate Range (THRR);Understanding of Exercise Prescription Increase Physical Activity;Increase Strength and Stamina;Able to understand and use rate of perceived exertion (RPE) scale;Able to understand and use Dyspnea scale;Knowledge and understanding of Target Heart Rate Range (THRR);Understanding of Exercise Prescription Increase Physical Activity;Increase Strength and Stamina;Able to understand and use rate of perceived exertion (RPE) scale;Able to understand and use Dyspnea scale;Knowledge and understanding of Target Heart Rate Range (THRR);Understanding of Exercise Prescription Increase Physical Activity;Increase Strength and Stamina;Able to understand and use rate of perceived exertion (RPE) scale;Able to understand and use Dyspnea scale;Knowledge and understanding of Target Heart Rate Range (THRR);Understanding of Exercise Prescription    Comments Pt has completed 1 exercise session and tolerated well. She is  exercising at 1.9 METS on the Nustep and 2.3 METS on the bike. Will continue to monitor and progress as able. Pt has completed 5 exercise sessions and has been making progressions with METS and workloads. She is exercising at 2.0 METS on the Nustep and 2.3 METS on the bike. Will continue to monitor and progress as she is able. Pt has been out from rehab waiting to be cleared from the doctor. She had a stress test which showed nothing abnormal, but the Dr. said she needed a follow up appt before she could be cleared. Just waiting on her clearance note. Will continue to follow and monitor Pt has completed 10 exercise sessions and has been slow to make progressions since returning to rehab. She is exercising at 2.0 METS on the Nustep and 2.3 METS of the bike. She is independent with resistance bands and cool down stretches. Will continue to monitor and progress as she is able.    Expected Outcomes Through exercise at rehab and home the patient will decrease shortness of breath with daily activities and feel confident in carrying out an exercise regimn at home. Through exercise at rehab and home the patient will decrease shortness of breath with daily activities and feel confident in carrying out an exercise regimn at home. Through exercise at rehab and home the patient will decrease shortness of breath with daily activities and feel confident in carrying out an exercise regimn at home. Through exercise at rehab and home the patient will decrease shortness of breath with daily activities and feel confident in carrying out an exercise regimn at home.  Discharge Exercise Prescription (Final Exercise Prescription Changes):  Exercise Prescription Changes - 09/28/20 1500      Home Exercise Plan   Plans to continue exercise at Home (comment)   Walking and resistance bands   Frequency Add 2 additional days to program exercise sessions.    Initial Home Exercises Provided 09/28/20           Nutrition:   Target Goals: Understanding of nutrition guidelines, daily intake of sodium 1500mg , cholesterol 200mg , calories 30% from fat and 7% or less from saturated fats, daily to have 5 or more servings of fruits and vegetables.  Biometrics:  Pre Biometrics - 07/10/20 1622      Pre Biometrics   Grip Strength 22 kg            Nutrition Therapy Plan and Nutrition Goals:  Nutrition Therapy & Goals - 07/27/20 1436      Nutrition Therapy   Diet heart healthy/carb mod      Personal Nutrition Goals   Nutrition Goal Pt to identify food quantities necessary to achieve weight loss of 6-24 lb at graduation from pulmonary rehab.    Personal Goal #2 Pt to build a healthy plate including vegetables, fruits, whole grains, and low-fat dairy products in a heart healthy meal plan.      Intervention Plan   Intervention Prescribe, educate and counsel regarding individualized specific dietary modifications aiming towards targeted core components such as weight, hypertension, lipid management, diabetes, heart failure and other comorbidities.    Expected Outcomes Short Term Goal: Understand basic principles of dietary content, such as calories, fat, sodium, cholesterol and nutrients.           Nutrition Assessments:  Nutrition Assessments - 07/19/20 1344      Rate Your Plate Scores   Pre Score 57          MEDIFICTS Score Key:  ?70 Need to make dietary changes   40-70 Heart Healthy Diet  ? 40 Therapeutic Level Cholesterol Diet   Picture Your Plate Scores:  <25 Unhealthy dietary pattern with much room for improvement.  41-50 Dietary pattern unlikely to meet recommendations for good health and room for improvement.  51-60 More healthful dietary pattern, with some room for improvement.   >60 Healthy dietary pattern, although there may be some specific behaviors that could be improved.    Nutrition Goals Re-Evaluation:  Nutrition Goals Re-Evaluation    [place] Name 07/27/20 1436 08/10/20  0849 09/04/20 1342 10/02/20 0828       Goals   Current Weight 188 lb (85.3 kg) 185 lb 13.6 oz (84.3 kg) 185 lb 13.6 oz (84.3 kg) 188 lb 15 oz (85.7 kg)    Nutrition Goal - Pt to identify food quantities necessary to achieve weight loss of 6-24 lb at graduation from pulmonary rehab. Pt to identify food quantities necessary to achieve weight loss of 6-24 lb at graduation from pulmonary rehab. Pt to identify food quantities necessary to achieve weight loss of 6-24 lb at graduation from pulmonary rehab.    Comment - - No change as patient has been absent since 11/9. Weight maintained         Personal Goal #2 Re-Evaluation   Personal Goal #2 - Pt to build a healthy plate including vegetables, fruits, whole grains, and low-fat dairy products in a heart healthy meal plan. Pt to build a healthy plate including vegetables, fruits, whole grains, and low-fat dairy products in a heart healthy meal plan. Pt to build a healthy  plate including vegetables, fruits, whole grains, and low-fat dairy products in a heart healthy meal plan.           Nutrition Goals Discharge (Final Nutrition Goals Re-Evaluation):  Nutrition Goals Re-Evaluation - 10/02/20 0828      Goals   Current Weight 188 lb 15 oz (85.7 kg)    Nutrition Goal Pt to identify food quantities necessary to achieve weight loss of 6-24 lb at graduation from pulmonary rehab.    Comment Weight maintained      Personal Goal #2 Re-Evaluation   Personal Goal #2 Pt to build a healthy plate including vegetables, fruits, whole grains, and low-fat dairy products in a heart healthy meal plan.           Psychosocial: Target Goals: Acknowledge presence or absence of significant depression and/or stress, maximize coping skills, provide positive support system. Participant is able to verbalize types and ability to use techniques and skills needed for reducing stress and depression.  Initial Review & Psychosocial Screening:  Initial Psych Review &  Screening - 07/10/20 1356      Initial Review   Current issues with History of Depression      Family Dynamics   Good Support System? Yes    Comments cares for husband who has memory and physical issues      Barriers   Psychosocial barriers to participate in program The patient should benefit from training in stress management and relaxation.      Screening Interventions   Interventions Encouraged to exercise           Quality of Life Scores:  Scores of 19 and below usually indicate a poorer quality of life in these areas.  A difference of  2-3 points is a clinically meaningful difference.  A difference of 2-3 points in the total score of the Quality of Life Index has been associated with significant improvement in overall quality of life, self-image, physical symptoms, and general health in studies assessing change in quality of life.  PHQ-9: Recent Review Flowsheet Data    Depression screen [facility] 2/9 07/10/2020   Decreased Interest 0   Down, Depressed, Hopeless 0   PHQ - 2 Score 0   Altered sleeping 0   Tired, decreased energy 1   Change in appetite 0   Feeling bad or failure about yourself  0   Trouble concentrating 1   Moving slowly or fidgety/restless 0   Suicidal thoughts 0   PHQ-9 Score 2   Difficult doing work/chores Not difficult at all     Interpretation of Total Score  Total Score Depression Severity:  1-4 = Minimal depression, 5-9 = Mild depression, 10-14 = Moderate depression, 15-19 = Moderately severe depression, 20-27 = Severe depression   Psychosocial Evaluation and Intervention:  Psychosocial Evaluation - 07/19/20 1154      Psychosocial Evaluation & Interventions   Expected Outcomes Henretter will continue to have good managment of her depression both medically and counseling support           Psychosocial Re-Evaluation:  Psychosocial Re-Evaluation    Row Name 07/19/20 1153 07/19/20 1154 08/14/20 1403 08/31/20 1127 10/02/20 1355     Psychosocial  Re-Evaluation   Current issues with History of Depression - History of Depression;Current Stress Concerns History of Depression;Current Stress Concerns History of Depression;Current Stress Concerns   Comments Yanelle has completed 1 exercise session and displays healthy positive outlook - History of depression which is presently stable, she takes care of her husband  who has dementia. Has been absent from program for 4 weeks due to being on vacation and getting a cardiac w/u due to chest pain. 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   Expected Outcomes - Shaianne will continue to have good managment of her depression both medically and counseling support.  Delynda will be free of any psychosocial barriers. That Neta handles her stress in healthy ways. For her to be free of psychosocial concerns while participating in pulmonary rehab. For Breeonna to continue dealing with her stress in healthy ways.   Interventions - Encouraged to attend Pulmonary Rehabilitation for the exercise Encouraged to attend Pulmonary Rehabilitation for the exercise;Stress management education - Encouraged to attend Pulmonary Rehabilitation for the exercise;Stress management education   Continue Psychosocial Services  - Follow up required by staff No Follow up required Follow up required by staff No Follow up required     Initial Review   Source of Stress Concerns - - Family Family Family          Psychosocial Discharge (Final Psychosocial Re-Evaluation):  Psychosocial Re-Evaluation - 10/02/20 1355      Psychosocial Re-Evaluation   Current issues with History of Depression;Current Stress Concerns    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    Expected Outcomes For Jalila to continue dealing with her stress in healthy ways.    Interventions Encouraged to attend Pulmonary Rehabilitation for the exercise;Stress management  education    Continue Psychosocial Services  No Follow up required      Initial Review   Source of Stress Concerns Family           Education: Education Goals: Education classes will be provided on a weekly basis, covering required topics. Participant will state understanding/return demonstration of topics presented.  Learning Barriers/Preferences:  Learning Barriers/Preferences - 07/10/20 1403      Learning Barriers/Preferences   Learning Barriers Sight    Learning Preferences Written Material;Group Instruction;Individual Instruction           Education Topics: Risk Factor Reduction:  -Group instruction that is supported by a PowerPoint presentation. Instructor discusses the definition of a risk factor, different risk factors for pulmonary disease, and how the heart and lungs work together.   Flowsheet Row PULMONARY REHAB OTHER RESPIRATORY from 09/28/2020 in [place]  Date 09/28/20  Educator Handout  Instruction Review Code 1- Verbalizes Understanding      Nutrition for Pulmonary Patient:  -Group instruction provided by PowerPoint slides, verbal discussion, and written materials to support subject matter. The instructor gives an explanation and review of healthy diet recommendations, which includes a discussion on weight management, recommendations for fruit and vegetable consumption, as well as protein, fluid, caffeine, fiber, sodium, sugar, and alcohol. Tips for eating when patients are short of breath are discussed.   Pursed Lip Breathing:  -Group instruction that is supported by demonstration and informational handouts. Instructor discusses the benefits of pursed lip and diaphragmatic breathing and detailed demonstration on how to preform both.     Oxygen Safety:  -Group instruction provided by PowerPoint, verbal discussion, and written material to support subject matter. There is an overview of "What is Oxygen" and "Why do we need it".   Instructor also reviews how to create a safe environment for oxygen use, the importance of using oxygen as prescribed, and the risks of noncompliance. There is a brief discussion on  traveling with oxygen and resources the patient may utilize.   Oxygen Equipment:  -Group instruction provided by [facility] Staff utilizing handouts, written materials, and equipment demonstrations.   Signs and Symptoms:  -Group instruction provided by written material and verbal discussion to support subject matter. Warning signs and symptoms of infection, stroke, and heart attack are reviewed and when to call the physician/911 reinforced. Tips for preventing the spread of infection discussed.   Advanced Directives:  -Group instruction provided by verbal instruction and written material to support subject matter. Instructor reviews Advanced Directive laws and proper instruction for filling out document.   Pulmonary Video:  -Group video education that reviews the importance of medication and oxygen compliance, exercise, good nutrition, pulmonary hygiene, and pursed lip and diaphragmatic breathing for the pulmonary patient.   Exercise for the Pulmonary Patient:  -Group instruction that is supported by a PowerPoint presentation. Instructor discusses benefits of exercise, core components of exercise, frequency, duration, and intensity of an exercise routine, importance of utilizing pulse oximetry during exercise, safety while exercising, and options of places to exercise outside of rehab.   Flowsheet Row PULMONARY REHAB OTHER RESPIRATORY from 09/28/2020 in [place]  Date 07/27/20  Educator handout      Pulmonary Medications:  -Verbally interactive group education provided by instructor with focus on inhaled medications and proper administration.   Anatomy and Physiology of the Respiratory System and Intimacy:  -Group instruction provided by PowerPoint, verbal discussion, and  written material to support subject matter. Instructor reviews respiratory cycle and anatomical components of the respiratory system and their functions. Instructor also reviews differences in obstructive and restrictive respiratory diseases with examples of each. Intimacy, Sex, and Sexuality differences are reviewed with a discussion on how relationships can change when diagnosed with pulmonary disease. Common sexual concerns are reviewed.   MD DAY -A group question and answer session with a medical doctor that allows participants to ask questions that relate to their pulmonary disease state.   OTHER EDUCATION -Group or individual verbal, written, or video instructions that support the educational goals of the pulmonary rehab program.   Holiday Eating Survival Tips:  -Group instruction provided by PowerPoint slides, verbal discussion, and written materials to support subject matter. The instructor gives patients tips, tricks, and techniques to help them not only survive but enjoy the holidays despite the onslaught of food that accompanies the holidays.   Knowledge Questionnaire Score:  Knowledge Questionnaire Score - 07/10/20 1630      Knowledge Questionnaire Score   Pre Score 17/18           Core Components/Risk Factors/Patient Goals at Admission:  Personal Goals and Risk Factors at Admission - 07/10/20 1408      Core Components/Risk Factors/Patient Goals on Admission   Goal Weight: Short Term 178 lb (80.7 kg)    Goal Weight: Long Term 160 lb (72.6 kg)    Expected Outcomes Short Term: Continue to assess and modify interventions until short term weight is achieved;Long Term: Adherence to nutrition and physical activity/exercise program aimed toward attainment of established weight goal;Weight Loss: Understanding of general recommendations for a balanced deficit meal plan, which promotes 1-2 lb weight loss per week and includes a negative energy balance of [phone] kcal/d;Understanding  recommendations for meals to include 15-35% energy as protein, 25-35% energy from fat, 35-60% energy from carbohydrates, less than 200mg  of dietary cholesterol, 20-35 gm of total fiber daily;Understanding of distribution of calorie intake throughout the day with the consumption  of 4-5 meals/snacks    Improve shortness of breath with ADL's Yes    Intervention Provide education, individualized exercise plan and daily activity instruction to help decrease symptoms of SOB with activities of daily living.    Expected Outcomes Short Term: Improve cardiorespiratory fitness to achieve a reduction of symptoms when performing ADLs;Long Term: Be able to perform more ADLs without symptoms or delay the onset of symptoms    Hypertension Yes    Intervention Provide education on lifestyle modifcations including regular physical activity/exercise, weight management, moderate sodium restriction and increased consumption of fresh fruit, vegetables, and low fat dairy, alcohol moderation, and smoking cessation.;Monitor prescription use compliance.    Expected Outcomes Short Term: Continued assessment and intervention until BP is < 140/29mm HG in hypertensive participants. < 130/62mm HG in hypertensive participants with diabetes, heart failure or chronic kidney disease.;Long Term: Maintenance of blood pressure at goal levels.    Lipids Yes    Intervention Provide education and support for participant on nutrition & aerobic/resistive exercise along with prescribed medications to achieve LDL 70mg , HDL >40mg .    Expected Outcomes Short Term: Participant states understanding of desired cholesterol values and is compliant with medications prescribed. Participant is following exercise prescription and nutrition guidelines.;Long Term: Cholesterol controlled with medications as prescribed, with individualized exercise RX and with personalized nutrition plan. Value goals: LDL < 70mg , HDL > 40 mg.           Core Components/Risk  Factors/Patient Goals Review:   Goals and Risk Factor Review    Row Name 07/19/20 1155 08/14/20 1405 08/31/20 1129 10/02/20 1357       Core Components/Risk Factors/Patient Goals Review   Personal Goals Review Weight Management/Obesity;Improve shortness of breath with ADL's;Hypertension;Lipids;Develop more efficient breathing techniques such as purse lipped breathing and diaphragmatic breathing and practicing self-pacing with activity.;Increase knowledge of respiratory medications and ability to use respiratory devices properly. Improve shortness of breath with ADL's;Develop more efficient breathing techniques such as purse lipped breathing and diaphragmatic breathing and practicing self-pacing with activity.;Increase knowledge of respiratory medications and ability to use respiratory devices properly. Weight Management/Obesity;Develop more efficient breathing techniques such as purse lipped breathing and diaphragmatic breathing and practicing self-pacing with activity.;Increase knowledge of respiratory medications and ability to use respiratory devices properly.;Improve shortness of breath with ADL's Develop more efficient breathing techniques such as purse lipped breathing and diaphragmatic breathing and practicing self-pacing with activity.;Increase knowledge of respiratory medications and ability to use respiratory devices properly.;Improve shortness of breath with ADL's    Review Prisca has completed 1 exercise session.  Not enough information to assess progress toward admission goals. Micala just started pulmonary rehab, has attended 5 exercises sessions, then she was on vacation for 10 days.  She will return to exercise tomorrow and will continue increasing her exercise workloads. Has been absent for 4 weeks, hopefully will be cleared by cardiology after 09/05/2020 appointment. Her attendance has been regular since 09/12/2020, she was cleared by her cardiologist to exercise in pulmonary rehab.  She works  hard and her workloads are increasing.    Expected Outcomes See Admission goals That Zahriyah will see much improvement in her strength, stamina, and shortness of breath in the next full 30 days. Will start exercising with pulmonary rehab after cleared from a cardiology standpoint. To continue to increase workloads as tolerated.           Core Components/Risk Factors/Patient Goals at Discharge (Final Review):   Goals and Risk Factor Review - 10/02/20 1357  Core Components/Risk Factors/Patient Goals Review   Personal Goals Review Develop more efficient breathing techniques such as purse lipped breathing and diaphragmatic breathing and practicing self-pacing with activity.;Increase knowledge of respiratory medications and ability to use respiratory devices properly.;Improve shortness of breath with ADL's    Review Her attendance has been regular since 09/12/2020, she was cleared by her cardiologist to exercise in pulmonary rehab.  She works hard and her workloads are increasing.    Expected Outcomes To continue to increase workloads as tolerated.           ITP Comments:   Comments: ITP REVIEW Pt is making expected progress toward pulmonary rehab goals after completing 10 sessions. Recommend continued exercise, life style modification, education, and utilization of breathing techniques to increase stamina and strength and decrease shortness of breath with exertion.

## 2020-10-05 ENCOUNTER — Encounter (HOSPITAL_COMMUNITY): Payer: Medicare Other

## 2020-10-10 ENCOUNTER — Encounter (HOSPITAL_COMMUNITY): Payer: Medicare Other

## 2020-10-12 ENCOUNTER — Encounter (HOSPITAL_COMMUNITY): Payer: Medicare Other

## 2020-10-13 ENCOUNTER — Telehealth: Payer: Self-pay | Admitting: Podiatry

## 2020-10-13 NOTE — Telephone Encounter (Signed)
DOS: 10/24/2020  Procedures: Hammertoe Repair 4th Rt ([id number]) and Exostectomy 4th Rt ([id number])  [employer] Medicare Effective From 09/23/2020 - Present  Deductible: $0 Out of Network: $500 with $0 met and $500 remaining. CoInsurance: 100% Copay: $20  Notification or Prior Authorization is not required for the requested services  This [employer]vant[employer] members plan does not currently require a prior authorization for these services. If you have general questions about the prior authorization requirements, please call us at [phone] or visit [email or web address] > Clinician Resources > Advance and Admission Notification Requirements. The number above acknowledges your notification. Please write this number down for future reference. Notification is not a guarantee of coverage or payment.  Decision ID #:[id number]   The number above acknowledges your inquiry and our response. Please write this number down and refer to it for future inquiries. Coverage and payment for an item or service is governed by the member's benefit plan document, and, if applicable, the provider's participation agreement with the Health Plan.  [place] BCBS Effective From 09/23/2020 - 09/22/2198  Deductible: $1,500 with $0 met and $1,500 remaining. Out of Pocket: $5,900 with $33.64 met and $5,866.36 remaining. CoInsurance: 70% / 30% Copay: $0  Per BCBS Website no Prior Authorization is required.  

## 2020-10-18 ENCOUNTER — Other Ambulatory Visit: Payer: Self-pay

## 2020-10-18 ENCOUNTER — Ambulatory Visit (INDEPENDENT_AMBULATORY_CARE_PROVIDER_SITE_OTHER): Payer: Medicare Other | Admitting: Neurology

## 2020-10-18 ENCOUNTER — Encounter: Payer: Self-pay | Admitting: Neurology

## 2020-10-18 VITALS — BP 109/69 | HR 91 | Ht 62.0 in | Wt 192.0 lb

## 2020-10-18 DIAGNOSIS — I2510 Atherosclerotic heart disease of native coronary artery without angina pectoris: Secondary | ICD-10-CM

## 2020-10-18 DIAGNOSIS — Z6835 Body mass index (BMI) 35.0-35.9, adult: Secondary | ICD-10-CM

## 2020-10-18 DIAGNOSIS — I6300 Cerebral infarction due to thrombosis of unspecified precerebral artery: Secondary | ICD-10-CM

## 2020-10-18 DIAGNOSIS — I6350 Cerebral infarction due to unspecified occlusion or stenosis of unspecified cerebral artery: Secondary | ICD-10-CM

## 2020-10-18 DIAGNOSIS — I2583 Coronary atherosclerosis due to lipid rich plaque: Secondary | ICD-10-CM

## 2020-10-18 DIAGNOSIS — I63031 Cerebral infarction due to thrombosis of right carotid artery: Secondary | ICD-10-CM

## 2020-10-18 NOTE — Patient Instructions (Signed)

## 2020-10-18 NOTE — Progress Notes (Signed)
SLEEP MEDICINE CLINIC    Provider:  Larey Seat, MD  Primary Care Physician:  Crist Infante, MD [place] [address]     Referring Provider:  Dr Einar Gip, MD at [facility].   Referred by Dr Leonie Man, MD Primary Neurology.           Chief Complaint according to patient   Patient presents with:    . New Patient (Initial Visit)     Patine thad a stroke on June 21st 2021, and was seen with sleep apnea in the ED, followed by Dr. Leonie Man. She is enrolled in the pac[place] stroke study . Dr. Felecia Shelling ordered an MRI  09-11-2020. CAD documented through Dr. Einar Gip. History of stable angina 08-13-2020,  and was in Pulmonary Rehab.         HISTORY OF PRESENT ILLNESS:  Shelley Spencer is a 75 y.o. year old White or Caucasian female patient seen here as a referral on 10/18/2020 from Dr Virgina Jock. Chief concern according to patient : see above :  Shelley Spencer presents today upon referral by her cardiology practice, her primary care physician is Dr. Haynes Kerns but in June 2021 she had developed a gait disturbance involving her left lower extremity and was diagnosed with a right pontine stroke she recuperated well from this an MRI had showed moderate chronic small vessel disease.  She had to stop cholesterol medication due to muscle aches.  In November she presented to the [facility] emergency department for complaints of chest pain troponins were negative EKG without changes she was evaluated by Dr. Dr. Lemmie Evens at the time started on Imdur for what was considered chronic stable angina.  She had no recurrence of the chest pain.  But they have been reports that the patient had dropping oxygen levels while she was sleeping and observed.    Shelley Spencer is a right -handed Caucasian female with a possible sleep disorder. She  has a past medical history of Anemia, Anxiety, Arthritis, Asthma, Bicornuate uterus, CAD (coronary artery disease), Colon polyp, clinical Depression,  Deviated nasal septum, DJD (degenerative joint disease), Fibroids, GERD (gastroesophageal reflux disease), Hematuria, Hiatal hernia, HNP (herniated nucleus pulposus), Hyperlipidemia, Hypertension, MVP (mitral valve prolapse), Pre-diabetes, Retinal tear (2005), and Shingles (1980's)..   Family medical /sleep history:no other family member on CPAP with OSA, both parents had COPD.    Social history:  Patient is working as as a Pharmacist, hospital, she married a much older man and he has chronic health condition, culminating in a heart transplant.   she lives in a household with spouse, her children are grown. A 61 year old son lives with them and a 59 year-old grandchild, The boy overdosed on Fentanyl while in mother's hoe, now son has custody. Son is a recovering alcoholic.   The patient currently works full time in special ed. Pets are present. A dog and a cat.  Tobacco use see above .The patient has a remote history of smoking. Age 26-32, ETOH use - seldom,  Caffeine intake in form of Coffee( 1 cup a day) Soda( /) Tea (iced tea - 3 glasses a day. )  Regular exercise - not much.       Sleep habits are as follows: The patient's dinner time is between 6-7 PM. The patient goes to bed at 10-11 PM, takes trazodone. Sleeps in a cool, quiet and dark bedroom- husband is deaf.  She continues to sleep for intervals of 1-2 hours when not  on Trazodone, wakes for 1 bathroom break.  The preferred sleep position is on her side, with the support of 1-2 pillows.  Dreams are reportedly infrequent but /vivid.  7  AM is the usual rise time.  The patient wakes up with an alarm. Likes to snooze the alarm.  She reports not feeling refreshed or restored in AM, with symptoms such as dry mouth , but no morning headaches, and residual fatigue. By lunchtime she gets sleepy again.  Naps are taken infrequently.   Review of Systems: Out of a complete 14 system review, the patient complains of only the following symptoms, and all other  reviewed systems are negative.:  Fatigue, sleepiness , snoring, fragmented sleep if she doesn't take Trazodone.    How likely are you to doze in the following situations: 0 = not likely, 1 = slight chance, 2 = moderate chance, 3 = high chance   Sitting and Reading? Watching Television? Sitting inactive in a public place (theater or meeting)? As a passenger in a car for an hour without a break? Lying down in the afternoon when circumstances permit? Sitting and talking to someone? Sitting quietly after lunch without alcohol? In a car, while stopped for a few minutes in traffic?   Total = 12/ 24 points   FSS endorsed at 33/ 63 points.   Social History   Socioeconomic History  . Marital status: Married    Spouse name: Not on file  . Number of children: 2  . Years of education: Not on file  . Highest education level: Not on file  Occupational History  . Occupation: Editor, commissioning  Tobacco Use  . Smoking status: Former Smoker    Packs/day: 1.00    Years: 20.00    Pack years: 20.00    Types: Cigarettes    Quit date: 09/24/1979    Years since quitting: 41.0  . Smokeless tobacco: Never Used  Vaping Use  . Vaping Use: Never used  Substance and Sexual Activity  . Alcohol use: Not Currently    Alcohol/week: 1.0 standard drink    Types: 1 Standard drinks or equivalent per week  . Drug use: No  . Sexual activity: Yes    Partners: Male    Birth control/protection: Surgical    Comment: R-TLH/BSO  Other Topics Concern  . Not on file  Social History Narrative   Husband with heart transplant.  Lives with husband and son.   Social Determinants of Health   Financial Resource Strain: Not on file  Food Insecurity: Not on file  Transportation Needs: Not on file  Physical Activity: Not on file  Stress: Not on file  Social Connections: Not on file    Family History  Problem Relation Age of Onset  . Heart failure Father        Possibly related to EtOH  . Alcohol abuse Father   .  Emphysema Father   . Emphysema Mother   . Heart failure Mother        Possibly related to EtOH  . Diabetes Mother   . Alcohol abuse Mother   . Valvular heart disease Brother   . Bipolar disorder Brother   . Crohn's disease Daughter   . Thyroid disease Daughter   . Colon cancer Neg Hx   . Stomach cancer Neg Hx   . Esophageal cancer Neg Hx   . Rectal cancer Neg Hx   . Liver cancer Neg Hx   . Stroke Neg Hx  Past Medical History:  Diagnosis Date  . Anemia   . Anxiety   . Arthritis   . Asthma   . Bicornuate uterus    double cervix  . CAD (coronary artery disease)   . Colon polyp   . Depression   . Deviated septum   . DJD (degenerative joint disease)    L-Spine  . Fibroids   . GERD (gastroesophageal reflux disease)   . Hematuria   . Hiatal hernia   . HNP (herniated nucleus pulposus)    lower back  . Hyperlipidemia   . Hypertension   . MVP (mitral valve prolapse)    pt denies, pt states she had a Echo ?2008 and it did not show MVP  . Pre-diabetes   . Retinal tear 2005   Small right retinal tear  . Shingles 1980's   on her waist    Past Surgical History:  Procedure Laterality Date  . ANTERIOR CERVICAL DECOMP/DISCECTOMY FUSION N/A 09/05/2017   Procedure: Cervical four-five, Cervical five-six, Cervical six-seven Anterior cervical decompression/discectomy/fusion;  Surgeon: Erline Levine, MD;  Location: [facility];  Service: Neurosurgery;  Laterality: N/A;  . bone spur surgery Bilateral    x 3  . CARDIOVASCULAR STRESS TEST  02/08/2014  . COLONOSCOPY    . CYST EXCISION Left 09/29/2018   Procedure: LEFT MIDDLE FINGER CYST REMOVAL AND DEBRIDEMENT OF DISTAL INTERPHALANGEAL JOINT;  Surgeon: Daryll Brod, MD;  Location: [facility];  Service: Orthopedics;  Laterality: Left;  . FINGER SURGERY Left    left third finger mucoid cyst removed  . FOOT NEUROMA SURGERY Bilateral   . HYSTEROSCOPY     D&C PMB Endo Cx Polyp  . KNEE ARTHROSCOPY WITH MENISCAL REPAIR Right 2016    --Flossie Dibble, MD  . LAPAROSCOPIC HYSTERECTOMY  7/209/10   R-TLH/BSO  Fibroids, BAck Pain, Adenomyosis  . LUMBAR FUSION    . removal vaginal septum    . TOTAL KNEE ARTHROPLASTY Right 05/17/2019   Procedure: TOTAL KNEE ARTHROPLASTY;  Surgeon: Gaynelle Arabian, MD;  Location: [facility];  Service: Orthopedics;  Laterality: Right;  46min  . TUBAL LIGATION       Current Outpatient Medications on File Prior to Visit  Medication Sig Dispense Refill  . acetaminophen (TYLENOL) 650 MG CR tablet Take 1,300 mg by mouth every 8 (eight) hours as needed for pain.     Marland Kitchen albuterol (VENTOLIN HFA) 108 (90 Base) MCG/ACT inhaler Inhale 1-2 puffs into the lungs every 6 (six) hours as needed for wheezing or shortness of breath.     . Alirocumab (PRALUENT) 150 MG/ML SOAJ Inject 1 Syringe into the skin every 14 (fourteen) days. 6 mL 3  . aspirin EC 81 MG EC tablet Take 1 tablet (81 mg total) by mouth daily. Swallow whole. 30 tablet 11  . budesonide-formoterol (SYMBICORT) 160-4.5 MCG/ACT inhaler Inhale 2 puffs into the lungs daily.    Marland Kitchen buPROPion (WELLBUTRIN XL) 150 MG 24 hr tablet Take 150 mg by mouth daily.    Marland Kitchen CALCIUM-VITAMIN D PO Take 1 tablet by mouth daily.    . cetirizine (ZYRTEC) 10 MG tablet Take 10 mg by mouth daily.    Marland Kitchen doxazosin (CARDURA) 4 MG tablet Take 4 mg by mouth daily.     . furosemide (LASIX) 40 MG tablet Take 40 mg by mouth daily.    . Investigational - Study Medication Take 1 tablet by mouth daily. Study name: [place] stroke study Additional study details: [id number] bottle: 5, 15, 25 mg or placebo  1 each PRN  . Investigational - Study Medication Take 1 tablet by mouth daily. Study name: [place] stroke study Additional study details: [id number]/green bottle: 5, 15, 25 mg or placebo tablet (PI - Sethi) 1 each PRN  . isosorbide mononitrate (IMDUR) 30 MG 24 hr tablet Take 1 tablet (30 mg total) by mouth daily. 90 tablet 3  . Multiple Vitamins-Minerals (MULTIVITAMIN PO) Take 1 tablet by mouth  daily.    . nitroGLYCERIN (NITROSTAT) 0.4 MG SL tablet Place 1 tablet (0.4 mg total) under the tongue every 5 (five) minutes as needed for chest pain. 30 tablet 2  . olmesartan (BENICAR) 40 MG tablet Take 40 mg by mouth every evening.    . pantoprazole (PROTONIX) 40 MG tablet Take 1 tablet (40 mg total) by mouth 2 (two) times daily. (Patient taking differently: Take 40 mg by mouth every evening.) 180 tablet 3  . PARoxetine (PAXIL) 40 MG tablet Take 40 mg by mouth every evening.     . potassium chloride SA (K-DUR) 20 MEQ tablet Take 20 mEq by mouth daily.     . traZODone (DESYREL) 150 MG tablet Take 150 mg by mouth at bedtime.    . vitamin B-12 (CYANOCOBALAMIN) 1000 MCG tablet Take 1,000 mcg by mouth daily.     No current facility-administered medications on file prior to visit.    Allergies  Allergen Reactions  . Amlodipine Other (See Comments)    Legs swell some and she doesn't like it.  . Fenofibrate Other (See Comments)    Leg pain  . Omeprazole Other (See Comments)    Unknown  . Simvastatin Other (See Comments)    Myalgia, leg pains  . Singulair [Montelukast Sodium] Other (See Comments)    Affected mood  . Statins Other (See Comments)    Myalgia, leg pains    Physical exam:  Today's Vitals   10/18/20 1510  BP: 109/69  Pulse: 91  Weight: 192 lb (87.1 kg)  Height: 5\' 2"  (1.575 m)   Body mass index is 35.12 kg/m.   Wt Readings from Last 3 Encounters:  10/18/20 192 lb (87.1 kg)  10/03/20 188 lb 15 oz (85.7 kg)  09/19/20 189 lb 2.5 oz (85.8 kg)     Ht Readings from Last 3 Encounters:  10/18/20 5\' 2"  (1.575 m)  09/05/20 5\' 2"  (1.575 m)  08/13/20 5\' 2"  (1.575 m)      General: The patient is awake, alert and appears not in acute distress. The patient is well groomed. Head: Normocephalic, atraumatic. Neck is supple. Mallampati 2,  neck circumference:15. inches . Nasal airflow  patent.  Retrognathia is  seen.  Dental status: very crowded - she had teeth pulled to  allow straithening of her teeth.  Cardiovascular:  Regular rate and cardiac rhythm by pulse,  without distended neck veins. Respiratory: Lungs are clear to auscultation.  Skin:  Without evidence of ankle edema, or rash. Trunk: The patient's posture is erect.   Neurologic exam : The patient is awake and alert, oriented to place and time.   Memory subjective described as intact.  Attention span & concentration ability appears normal.  Speech is fluent,  without  dysarthria, dysphonia or aphasia.  Mood and affect are appropriate.   Cranial nerves: no loss of smell or taste reported  Pupils are equal and briskly reactive to light. Funduscopic exam deferred. .  Extraocular movements in vertical and horizontal planes were intact and with overshooting horizontal eye movements.  A form of  nystagmus. No Diplopia. Visual fields by finger perimetry are intact. Hearing was intact to soft voice and finger rubbing.    Facial sensation intact to fine touch.  Facial motor strength is symmetric and tongue and uvula move midline.  Neck ROM : rotation, tilt and flexion extension were normal for age and shoulder shrug was symmetrical.    Motor exam:  Symmetric bulk, tone and ROM.   Normal tone without cog- wheeling, symmetric grip strength .   Sensory:  Fine touch, pinprick and vibration were tested  and  normal.  Proprioception tested in the upper extremities was normal.   Coordination: Rapid alternating movements in the fingers/hands were of normal speed.  [place] ship has changed- fine motor skills impaired. Button closure.  The Finger-to-nose maneuver was intact without evidence of ataxia, dysmetria or tremor. Slower on the right .    Gait and station: Patient could rise unassisted from a seated position, walked without assistive device.  Stance is of normal width/ base and the patient turned with 3 steps.  Toe and heel walk were deferred.  Deep tendon reflexes: in the upper and lower extremities  are symmetric and intact.  Babinski response was deferred.       After spending a total time of 50 minutes face to face and additional time for physical and neurologic examination, review of laboratory studies,  personal review of imaging studies, reports and results of other testing and review of referral information / records as far as provided in visit, I have established the following assessments:  Moderate complexity of evaluation and prolonged examination.   1) Shelley Spencer has an interesting medical history she has of right carotid artery bruit, she has a history of coronary artery disease with stable angina, uncomplicated asthma according to her pulmonary physicians, she also had a cerebrovascular accident in June and was enrolled into a study with Dr. Leonie Man.  However her referral today came from outside, to Dr. Randa Lynn and Dr. Irven Shelling office.  This is based on an observed apnea and hypoxemia witnessed in the emergency room and the patient presented on 08-13-20.    My Plan is to proceed with:  1) Attended sleep study preferred due to CAD, Asthma and witnessed hypoxemia and apnea in the ED.  2) she believes not to snore but wakes up gasping, choking.  3)  Asthma, CAD, carotid artery disease , pontine stroke.  I would like to thank  Crist Infante, [address] [place] [place],  [place] [address] for allowing me to meet with and to take care of this pleasant patient.   In short, Shelley Spencer is presenting with witnessed apnea.   I plan to follow up either personally or through our NP within 3 month.   CC: I will share my notes with Dr Leonie Man, Dr. Virgina Jock, and  Dr. Joylene Draft.   Electronically signed by: Larey Seat, MD 10/18/2020 3:30 PM  [facility] [place] certified by The [employer] of Sleep Medicine and Diplomate of the [employer] of Sleep Medicine. Board certified In Neurology through the [place], Fellow of the [employer] of  Neurology. Medical Director of [employer].

## 2020-10-23 ENCOUNTER — Other Ambulatory Visit: Payer: Self-pay | Admitting: Adult Health

## 2020-10-23 DIAGNOSIS — Z006 Encounter for examination for normal comparison and control in clinical research program: Secondary | ICD-10-CM

## 2020-10-23 MED ORDER — HYDROCODONE-ACETAMINOPHEN 10-325 MG PO TABS: 1.0000 | ORAL_TABLET | Freq: Three times a day (TID) | ORAL | 0 refills | Status: AC | PRN

## 2020-10-23 MED ORDER — CLOPIDOGREL BISULFATE 75 MG PO TABS: 75.0000 mg | ORAL_TABLET | Freq: Every day | ORAL | 3 refills | Status: DC

## 2020-10-23 NOTE — Progress Notes (Signed)
Patient was seen thru [facility] research with completion of trial today.  She will stop all trial medications at this point.  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  Refill will be provided.

## 2020-10-23 NOTE — Addendum Note (Signed)
Addended by: Wallene Huh on: 10/23/2020 05:20 PM   Modules accepted: Orders

## 2020-10-24 DIAGNOSIS — M2041 Other hammer toe(s) (acquired), right foot: Secondary | ICD-10-CM | POA: Diagnosis not present

## 2020-10-24 DIAGNOSIS — M25774 Osteophyte, right foot: Secondary | ICD-10-CM | POA: Diagnosis not present

## 2020-10-24 NOTE — Progress Notes (Signed)
Discharge Progress Report  Patient Details  Name: Shelley Spencer MRN: [id number] Date of Birth: 1946-09-09 Referring Provider:   April [facility] Walk Test from 07/10/2020 in [place]  Referring Provider Dr. Letta Median       Number of Visits: 10  Reason for Discharge:  Patient reached a stable level of exercise. Patient independent in their exercise. Patient has met program and personal goals.  Smoking History:  Social History   Tobacco Use  Smoking Status Former Smoker  . Packs/day: 1.00  . Years: 20.00  . Pack years: 20.00  . Types: Cigarettes  . Quit date: 09/24/1979  . Years since quitting: 41.1  Smokeless Tobacco Never Used    Diagnosis:  Uncomplicated asthma, unspecified asthma severity, unspecified whether persistent  ADL UCSD:  Pulmonary Assessment Scores    Row Name 07/10/20 1630         ADL UCSD   ADL Phase Entry     SOB Score total 46           CAT Score   CAT Score 17           mMRC Score   mMRC Score 3            Initial Exercise Prescription:  Initial Exercise Prescription - 07/13/20 0700      Date of Initial Exercise RX and Referring Provider   Date 07/10/20    Referring Provider Dr. Letta Median    Expected Discharge Date 09/14/20      Recumbant Bike   Level 1.5    Minutes 15      NuStep   Level 2    Minutes 15      Prescription Details   Frequency (times per week) 2    Duration Progress to 30 minutes of continuous aerobic without signs/symptoms of physical distress      Intensity   THRR 40-80% of Max Heartrate 59-118    Ratings of Perceived Exertion 11-13    Perceived Dyspnea 0-4      Progression   Progression Continue to progress workloads to maintain intensity without signs/symptoms of physical distress.      Resistance Training   Training Prescription Yes    Weight orange bands    Reps 10-15           Discharge Exercise Prescription (Final Exercise Prescription  Changes):  Exercise Prescription Changes - 10/03/20 1400      Response to Exercise   Blood Pressure (Admit) 130/72    Blood Pressure (Exit) 124/70    Heart Rate (Admit) 83 bpm    Heart Rate (Exercise) 102 bpm    Heart Rate (Exit) 89 bpm    Oxygen Saturation (Admit) 98 %    Oxygen Saturation (Exercise) 97 %    Oxygen Saturation (Exit) 95 %    Rating of Perceived Exertion (Exercise) 10    Perceived Dyspnea (Exercise) 2    Duration Continue with 30 min of aerobic exercise without signs/symptoms of physical distress.    Intensity THRR unchanged      Progression   Progression Continue to progress workloads to maintain intensity without signs/symptoms of physical distress.      Resistance Training   Training Prescription Yes    Weight oramge bands    Reps 10-15    Time 10 Minutes      Recumbant Bike   Level 2    Minutes 15    METs 2  NuStep   Level 4    SPM 80    Minutes 15    METs 2.3           Functional Capacity:  6 Minute Walk    Row Name 07/10/20 1622         6 Minute Walk   Phase Initial     Distance 1188 feet     Walk Time 6 minutes     # of Rest Breaks 1  stopped at 5 minutes and 40 seconds due to hip pain scale 5/10     MPH 2.25     METS 2.22     RPE 13     Perceived Dyspnea  2     VO2 Peak 7.76     Symptoms Yes (comment)     Comments Hip pain 5/10     Resting HR 84 bpm     Resting BP 110/56     Resting Oxygen Saturation  94 %     Exercise Oxygen Saturation  during 6 min walk 82 %     Max Ex. HR 113 bpm     Max Ex. BP 120/68     2 Minute Post BP 110/60           Interval HR   1 Minute HR 95     2 Minute HR 92     3 Minute HR 111     4 Minute HR 111     5 Minute HR 113     6 Minute HR 102     2 Minute Post HR 93     Interval Heart Rate? Yes           Interval Oxygen   Interval Oxygen? Yes     Baseline Oxygen Saturation % 94 %     1 Minute Oxygen Saturation % 86 %     1 Minute Liters of Oxygen 0 L     2 Minute Oxygen Saturation  % 82 %     2 Minute Liters of Oxygen 0 L     3 Minute Oxygen Saturation % 97 %     3 Minute Liters of Oxygen 0 L     4 Minute Oxygen Saturation % 97 %     4 Minute Liters of Oxygen 0 L     5 Minute Oxygen Saturation % 97 %     5 Minute Liters of Oxygen 0 L     6 Minute Oxygen Saturation % 98 %     6 Minute Liters of Oxygen 0 L     2 Minute Post Oxygen Saturation % 99 %     2 Minute Post Liters of Oxygen 0 L            Psychological, QOL, Others - Outcomes: PHQ 2/9: Depression screen PHQ 2/9 07/10/2020  Decreased Interest 0  Down, Depressed, Hopeless 0  PHQ - 2 Score 0  Altered sleeping 0  Tired, decreased energy 1  Change in appetite 0  Feeling bad or failure about yourself  0  Trouble concentrating 1  Moving slowly or fidgety/restless 0  Suicidal thoughts 0  PHQ-9 Score 2  Difficult doing work/chores Not difficult at all    Quality of Life:   Personal Goals: Goals established at orientation with interventions provided to work toward goal.  Personal Goals and Risk Factors at Admission - 07/10/20 1408      Core Components/Risk Factors/Patient Goals on Admission  Goal Weight: Short Term 178 lb (80.7 kg)    Goal Weight: Long Term 160 lb (72.6 kg)    Expected Outcomes Short Term: Continue to assess and modify interventions until short term weight is achieved;Long Term: Adherence to nutrition and physical activity/exercise program aimed toward attainment of established weight goal;Weight Loss: Understanding of general recommendations for a balanced deficit meal plan, which promotes 1-2 lb weight loss per week and includes a negative energy balance of [id number] kcal/d;Understanding recommendations for meals to include 15-35% energy as protein, 25-35% energy from fat, 35-60% energy from carbohydrates, less than 213m of dietary cholesterol, 20-35 gm of total fiber daily;Understanding of distribution of calorie intake throughout the day with the consumption of 4-5 meals/snacks     Improve shortness of breath with ADL's Yes    Intervention Provide education, individualized exercise plan and daily activity instruction to help decrease symptoms of SOB with activities of daily living.    Expected Outcomes Short Term: Improve cardiorespiratory fitness to achieve a reduction of symptoms when performing ADLs;Long Term: Be able to perform more ADLs without symptoms or delay the onset of symptoms    Hypertension Yes    Intervention Provide education on lifestyle modifcations including regular physical activity/exercise, weight management, moderate sodium restriction and increased consumption of fresh fruit, vegetables, and low fat dairy, alcohol moderation, and smoking cessation.;Monitor prescription use compliance.    Expected Outcomes Short Term: Continued assessment and intervention until BP is < 140/943mHG in hypertensive participants. < 130/8027mG in hypertensive participants with diabetes, heart failure or chronic kidney disease.;Long Term: Maintenance of blood pressure at goal levels.    Lipids Yes    Intervention Provide education and support for participant on nutrition & aerobic/resistive exercise along with prescribed medications to achieve LDL <31m65mDL >40mg24m Expected Outcomes Short Term: Participant states understanding of desired cholesterol values and is compliant with medications prescribed. Participant is following exercise prescription and nutrition guidelines.;Long Term: Cholesterol controlled with medications as prescribed, with individualized exercise RX and with personalized nutrition plan. Value goals: LDL < 31mg,72m > 40 mg.            Personal Goals Discharge:  Goals and Risk Factor Review    Row Name 07/19/20 1155 08/14/20 1405 08/31/20 1129 10/02/20 1357       Core Components/Risk Factors/Patient Goals Review   Personal Goals Review Weight Management/Obesity;Improve shortness of breath with ADL's;Hypertension;Lipids;Develop more efficient  breathing techniques such as purse lipped breathing and diaphragmatic breathing and practicing self-pacing with activity.;Increase knowledge of respiratory medications and ability to use respiratory devices properly. Improve shortness of breath with ADL's;Develop more efficient breathing techniques such as purse lipped breathing and diaphragmatic breathing and practicing self-pacing with activity.;Increase knowledge of respiratory medications and ability to use respiratory devices properly. Weight Management/Obesity;Develop more efficient breathing techniques such as purse lipped breathing and diaphragmatic breathing and practicing self-pacing with activity.;Increase knowledge of respiratory medications and ability to use respiratory devices properly.;Improve shortness of breath with ADL's Develop more efficient breathing techniques such as purse lipped breathing and diaphragmatic breathing and practicing self-pacing with activity.;Increase knowledge of respiratory medications and ability to use respiratory devices properly.;Improve shortness of breath with ADL's    Review Shelley Spencer 1 exercise session.  Not enough information to assess progress toward admission goals. Shelley Spencer pulmonary rehab, has attended 5 exercises sessions, then she was on vacation for 10 days.  She will return to exercise tomorrow and will continue increasing her exercise workloads.  Has been absent for 4 weeks, hopefully will be cleared by cardiology after 09/05/2020 appointment. Her attendance has been regular since 09/12/2020, she was cleared by her cardiologist to exercise in pulmonary rehab.  She works hard and her workloads are increasing.    Expected Outcomes See Admission goals That Shelley Spencer will see much improvement in her strength, stamina, and shortness of breath in the next full 30 days. Will start exercising with pulmonary rehab after cleared from a cardiology standpoint. To continue to increase workloads as  tolerated.           Exercise Goals and Review:  Exercise Goals    Row Name 07/10/20 1344 07/10/20 1621           Exercise Goals   Increase Physical Activity Yes Yes      Intervention Provide advice, education, support and counseling about physical activity/exercise needs.;Develop an individualized exercise prescription for aerobic and resistive training based on initial evaluation findings, risk stratification, comorbidities and participant's personal goals. Provide advice, education, support and counseling about physical activity/exercise needs.;Develop an individualized exercise prescription for aerobic and resistive training based on initial evaluation findings, risk stratification, comorbidities and participant's personal goals.      Expected Outcomes Short Term: Attend rehab on a regular basis to increase amount of physical activity.;Long Term: Exercising regularly at least 3-5 days a week.;Long Term: Add in home exercise to make exercise part of routine and to increase amount of physical activity. Short Term: Attend rehab on a regular basis to increase amount of physical activity.;Long Term: Exercising regularly at least 3-5 days a week.;Long Term: Add in home exercise to make exercise part of routine and to increase amount of physical activity.      Increase Strength and Stamina Yes Yes      Intervention Provide advice, education, support and counseling about physical activity/exercise needs.;Develop an individualized exercise prescription for aerobic and resistive training based on initial evaluation findings, risk stratification, comorbidities and participant's personal goals. Provide advice, education, support and counseling about physical activity/exercise needs.;Develop an individualized exercise prescription for aerobic and resistive training based on initial evaluation findings, risk stratification, comorbidities and participant's personal goals.      Expected Outcomes Short Term:  Increase workloads from initial exercise prescription for resistance, speed, and METs.;Short Term: Perform resistance training exercises routinely during rehab and add in resistance training at home;Long Term: Improve cardiorespiratory fitness, muscular endurance and strength as measured by increased METs and functional capacity (6MWT) Short Term: Increase workloads from initial exercise prescription for resistance, speed, and METs.;Short Term: Perform resistance training exercises routinely during rehab and add in resistance training at home;Long Term: Improve cardiorespiratory fitness, muscular endurance and strength as measured by increased METs and functional capacity (6MWT)      Able to understand and use rate of perceived exertion (RPE) scale Yes Yes      Intervention Provide education and explanation on how to use RPE scale Provide education and explanation on how to use RPE scale      Expected Outcomes Short Term: Able to use RPE daily in rehab to express subjective intensity level;Long Term:  Able to use RPE to guide intensity level when exercising independently Short Term: Able to use RPE daily in rehab to express subjective intensity level;Long Term:  Able to use RPE to guide intensity level when exercising independently      Able to understand and use Dyspnea scale Yes Yes      Intervention Provide education and explanation on how  to use Dyspnea scale Provide education and explanation on how to use Dyspnea scale      Expected Outcomes Short Term: Able to use Dyspnea scale daily in rehab to express subjective sense of shortness of breath during exertion;Long Term: Able to use Dyspnea scale to guide intensity level when exercising independently Short Term: Able to use Dyspnea scale daily in rehab to express subjective sense of shortness of breath during exertion;Long Term: Able to use Dyspnea scale to guide intensity level when exercising independently      Knowledge and understanding of Target  Heart Rate Range (THRR) Yes Yes      Intervention Provide education and explanation of THRR including how the numbers were predicted and where they are located for reference Provide education and explanation of THRR including how the numbers were predicted and where they are located for reference      Expected Outcomes Short Term: Able to state/look up THRR;Long Term: Able to use THRR to govern intensity when exercising independently;Short Term: Able to use daily as guideline for intensity in rehab Short Term: Able to state/look up THRR;Long Term: Able to use THRR to govern intensity when exercising independently;Short Term: Able to use daily as guideline for intensity in rehab      Understanding of Exercise Prescription Yes Yes      Intervention Provide education, explanation, and written materials on patient's individual exercise prescription Provide education, explanation, and written materials on patient's individual exercise prescription      Expected Outcomes Short Term: Able to explain program exercise prescription;Long Term: Able to explain home exercise prescription to exercise independently Short Term: Able to explain program exercise prescription;Long Term: Able to explain home exercise prescription to exercise independently             Exercise Goals Re-Evaluation:  Exercise Goals Re-Evaluation    Row Name 07/20/20 0814 08/10/20 0812 09/05/20 0841 10/03/20 0807       Exercise Goal Re-Evaluation   Exercise Goals Review Increase Physical Activity;Increase Strength and Stamina;Able to understand and use rate of perceived exertion (RPE) scale;Able to understand and use Dyspnea scale;Knowledge and understanding of Target Heart Rate Range (THRR);Understanding of Exercise Prescription Increase Physical Activity;Increase Strength and Stamina;Able to understand and use rate of perceived exertion (RPE) scale;Able to understand and use Dyspnea scale;Knowledge and understanding of Target Heart Rate  Range (THRR);Understanding of Exercise Prescription Increase Physical Activity;Increase Strength and Stamina;Able to understand and use rate of perceived exertion (RPE) scale;Able to understand and use Dyspnea scale;Knowledge and understanding of Target Heart Rate Range (THRR);Understanding of Exercise Prescription Increase Physical Activity;Increase Strength and Stamina;Able to understand and use rate of perceived exertion (RPE) scale;Able to understand and use Dyspnea scale;Knowledge and understanding of Target Heart Rate Range (THRR);Understanding of Exercise Prescription    Comments Pt has completed 1 exercise session and tolerated well. She is exercising at 1.9 METS on the Nustep and 2.3 METS on the bike. Will continue to monitor and progress as able. Pt has completed 5 exercise sessions and has been making progressions with METS and workloads. She is exercising at 2.0 METS on the Nustep and 2.3 METS on the bike. Will continue to monitor and progress as she is able. Pt has been out from rehab waiting to be cleared from the doctor. She had a stress test which showed nothing abnormal, but the Dr. said she needed a follow up appt before she could be cleared. Just waiting on her clearance note. Will continue to follow and monitor Pt  has completed 10 exercise sessions and has been slow to make progressions since returning to rehab. She is exercising at 2.0 METS on the Nustep and 2.3 METS of the bike. She is independent with resistance bands and cool down stretches. Will continue to monitor and progress as she is able.    Expected Outcomes Through exercise at rehab and home the patient will decrease shortness of breath with daily activities and feel confident in carrying out an exercise regimn at home. Through exercise at rehab and home the patient will decrease shortness of breath with daily activities and feel confident in carrying out an exercise regimn at home. Through exercise at rehab and home the patient  will decrease shortness of breath with daily activities and feel confident in carrying out an exercise regimn at home. Through exercise at rehab and home the patient will decrease shortness of breath with daily activities and feel confident in carrying out an exercise regimn at home.           Nutrition & Weight - Outcomes:  Pre Biometrics - 07/10/20 1622      Pre Biometrics   Grip Strength 22 kg            Nutrition:  Nutrition Therapy & Goals - 07/27/20 1436      Nutrition Therapy   Diet heart healthy/carb mod      Personal Nutrition Goals   Nutrition Goal Pt to identify food quantities necessary to achieve weight loss of 6-24 lb at graduation from pulmonary rehab.    Personal Goal #2 Pt to build a healthy plate including vegetables, fruits, whole grains, and low-fat dairy products in a heart healthy meal plan.      Intervention Plan   Intervention Prescribe, educate and counsel regarding individualized specific dietary modifications aiming towards targeted core components such as weight, hypertension, lipid management, diabetes, heart failure and other comorbidities.    Expected Outcomes Short Term Goal: Understand basic principles of dietary content, such as calories, fat, sodium, cholesterol and nutrients.           Nutrition Discharge:  Nutrition Assessments - 07/19/20 1344      Rate Your Plate Scores   Pre Score 57           Education Questionnaire Score:  Knowledge Questionnaire Score - 07/10/20 1630      Knowledge Questionnaire Score   Pre Score 17/18           Goals reviewed with patient; copy given to patient.

## 2020-10-24 NOTE — Addendum Note (Signed)
Encounter addended by: Lance Morin, RN on: 10/24/2020 3:36 PM  Actions taken: Clinical Note Signed, Episode resolved

## 2020-10-30 ENCOUNTER — Other Ambulatory Visit: Payer: Self-pay

## 2020-10-30 ENCOUNTER — Ambulatory Visit (INDEPENDENT_AMBULATORY_CARE_PROVIDER_SITE_OTHER): Payer: Medicare Other | Admitting: Podiatry

## 2020-10-30 ENCOUNTER — Encounter: Payer: Self-pay | Admitting: Podiatry

## 2020-10-30 ENCOUNTER — Ambulatory Visit (INDEPENDENT_AMBULATORY_CARE_PROVIDER_SITE_OTHER): Payer: Medicare Other

## 2020-10-30 DIAGNOSIS — M2041 Other hammer toe(s) (acquired), right foot: Secondary | ICD-10-CM | POA: Diagnosis not present

## 2020-10-31 NOTE — Progress Notes (Signed)
Subjective:   Patient ID: [name], female   DOB: 75 y.o.   MRN: [id number]   HPI Patient states doing well with surgery very pleased so far with how I am feeling   ROS      Objective:  Physical Exam  Neurovascular status intact negative Bevelyn Buckles' sign noted wound edges well healed right good alignment noted     Assessment:  Do been well post arthroplasty exostectomy right foot     Plan:  H&P x-rays reviewed advised on continued elevated activity and surgical shoe and reapplied sterile dressing.  Reappoint 2 weeks suture removal earlier if needed  X-rays indicate satisfactory resection of bone good position of toe

## 2020-11-10 ENCOUNTER — Ambulatory Visit (INDEPENDENT_AMBULATORY_CARE_PROVIDER_SITE_OTHER): Payer: Medicare Other | Admitting: Neurology

## 2020-11-10 ENCOUNTER — Other Ambulatory Visit: Payer: Self-pay

## 2020-11-10 DIAGNOSIS — E66812 Obesity, class 2: Secondary | ICD-10-CM

## 2020-11-10 DIAGNOSIS — G4733 Obstructive sleep apnea (adult) (pediatric): Secondary | ICD-10-CM | POA: Diagnosis not present

## 2020-11-10 DIAGNOSIS — I6350 Cerebral infarction due to unspecified occlusion or stenosis of unspecified cerebral artery: Secondary | ICD-10-CM

## 2020-11-10 DIAGNOSIS — I2510 Atherosclerotic heart disease of native coronary artery without angina pectoris: Secondary | ICD-10-CM

## 2020-11-10 DIAGNOSIS — I2583 Coronary atherosclerosis due to lipid rich plaque: Secondary | ICD-10-CM

## 2020-11-10 DIAGNOSIS — Z6835 Body mass index (BMI) 35.0-35.9, adult: Secondary | ICD-10-CM

## 2020-11-10 DIAGNOSIS — I6300 Cerebral infarction due to thrombosis of unspecified precerebral artery: Secondary | ICD-10-CM

## 2020-11-10 DIAGNOSIS — G4736 Sleep related hypoventilation in conditions classified elsewhere: Secondary | ICD-10-CM

## 2020-11-10 DIAGNOSIS — I63031 Cerebral infarction due to thrombosis of right carotid artery: Secondary | ICD-10-CM

## 2020-11-14 ENCOUNTER — Other Ambulatory Visit: Payer: Self-pay

## 2020-11-14 DIAGNOSIS — E7800 Pure hypercholesterolemia, unspecified: Secondary | ICD-10-CM

## 2020-11-14 DIAGNOSIS — I2510 Atherosclerotic heart disease of native coronary artery without angina pectoris: Secondary | ICD-10-CM

## 2020-11-14 DIAGNOSIS — Z789 Other specified health status: Secondary | ICD-10-CM

## 2020-11-14 MED ORDER — PRALUENT 150 MG/ML ~~LOC~~ SOAJ: 1.0000 | SUBCUTANEOUS | 3 refills | Status: DC

## 2020-11-15 ENCOUNTER — Other Ambulatory Visit: Payer: Self-pay

## 2020-11-15 ENCOUNTER — Ambulatory Visit (INDEPENDENT_AMBULATORY_CARE_PROVIDER_SITE_OTHER): Payer: Medicare Other

## 2020-11-15 ENCOUNTER — Ambulatory Visit (INDEPENDENT_AMBULATORY_CARE_PROVIDER_SITE_OTHER): Payer: Medicare Other | Admitting: Podiatry

## 2020-11-15 ENCOUNTER — Encounter: Payer: Self-pay | Admitting: Podiatry

## 2020-11-15 DIAGNOSIS — E7800 Pure hypercholesterolemia, unspecified: Secondary | ICD-10-CM

## 2020-11-15 DIAGNOSIS — M2041 Other hammer toe(s) (acquired), right foot: Secondary | ICD-10-CM

## 2020-11-15 DIAGNOSIS — I2510 Atherosclerotic heart disease of native coronary artery without angina pectoris: Secondary | ICD-10-CM

## 2020-11-15 DIAGNOSIS — Z789 Other specified health status: Secondary | ICD-10-CM

## 2020-11-19 NOTE — Progress Notes (Signed)
Subjective:   Patient ID: Shelley Spencer, female   DOB: 75 y.o.   MRN: [id number]   HPI Patient states doing very well with surgery very pleased   ROS      Objective:  Physical Exam  Neurovascular status intact digit good alignment wound edges coapted well     Assessment:  Doing well post digital procedures right     Plan:  H&P stitches removed wound edges coapted well dressings applied advised on gradual return to soft shoes over the next couple weeks and patient will be seen back as needed  X-rays indicate good alignment digital bone resection adequate for patient

## 2020-11-20 DIAGNOSIS — I6350 Cerebral infarction due to unspecified occlusion or stenosis of unspecified cerebral artery: Secondary | ICD-10-CM | POA: Insufficient documentation

## 2020-11-20 DIAGNOSIS — I63031 Cerebral infarction due to thrombosis of right carotid artery: Secondary | ICD-10-CM | POA: Insufficient documentation

## 2020-11-20 NOTE — Addendum Note (Signed)
Addended by: Larey Seat on: 11/20/2020 06:03 PM   Modules accepted: Orders

## 2020-11-20 NOTE — Procedures (Signed)
PATIENT'S NAME:  Shelley Spencer, Shelley Spencer DOB:      06-Nov-1945      MR#:    [id number]     DATE OF RECORDING: 11/10/2020 REFERRING M.D.:  Antony Contras, MD. Dr. Virgina Jock. Dr Joylene Draft.  Study Performed:   Baseline Polysomnogram to evaluate for apnea and hypoxemia.  HISTORY:  Shelley Spencer presents upon referral by her cardiology practice, and her primary care physician is Dr. Joylene Draft, but in June 2021 she had developed a gait disturbance involving her left lower extremity and was diagnosed with a right pontine stroke. She recuperated well from this and an MRI had showed moderate chronic small vessel disease.  She had to stop cholesterol medication due to muscle aches.  In November she presented to the [facility] emergency department for complaints of chest pain troponins were negative EKG without changes she was evaluated by Dr. 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  She had no recurrence of the chest pain.  But they have been reports that the patient had dropping oxygen levels while she was sleeping, these were hospital-based observations.   Shelley Spencer has a past medical history of Anemia, Anxiety, Arthritis, Asthma, Bicornuate uterus, CAD (coronary artery disease), Colon polyp, clinical Depression, Deviated nasal septum, DJD (degenerative joint disease), Fibroids, GERD (gastroesophageal reflux disease), Hematuria, Hiatal hernia, HNP (herniated nucleus pulposus), Hyperlipidemia, Hypertension, MVP (mitral valve prolapse), Pre-diabetes, Retinal tear (2005), and Shingles (1980's)..  The patient endorsed the Epworth Sleepiness Scale at 12/24 points.  FSS at 33/63 points.  The patient's weight 192 pounds with a height of 62 (inches), resulting in a BMI of 35.3 kg/m2. The patient's neck circumference measured 15 inches.  CURRENT MEDICATIONS: Tylenol, Ventolin, Praluent, Aspirin, Symbicort, Wellbutrin, Calcium-vitamin D, Zyrtec, Cardura, Lasix, Imdur,  Multivitamin, Nitrostat, Benicar, Protonix, Paxil, K-Dur, Desyrel, Vitamin B12   PROCEDURE:  This is a multichannel digital polysomnogram utilizing the Somnostar 11.2 system.  Electrodes and sensors were applied and monitored per AASM Specifications.   EEG, EOG, Chin and Limb EMG, were sampled at 200 Hz.  ECG, Snore and Nasal Pressure, Thermal Airflow, Respiratory Effort, CPAP Flow and Pressure, Oximetry was sampled at 50 Hz. Digital video and audio were recorded.      BASELINE STUDY: Lights Out was at 21:17 and Lights On at 05:07.  Total recording time (TRT) was 471 minutes, with a total sleep time (TST) of 418.5 minutes. The patient's sleep latency was 48.5 minutes.  REM latency was 386.5 minutes.  The sleep efficiency was 88.9 %.     SLEEP ARCHITECTURE: WASO (Wake after sleep onset) was 26.5 minutes.  There were 6.5 minutes in Stage N1, 234.5 minutes Stage N2, 119 minutes Stage N3 and 58.5 minutes in Stage REM.  The percentage of Stage N1 was 1.6%, Stage N2 was 56.%, Stage N3 was 28.4% and Stage R (REM sleep) was 14.0%.  RESPIRATORY ANALYSIS:  There were a total of 72 respiratory events:  38 obstructive apneas, 0 central apneas and 3 mixed apneas with a total of 41 apneas and an apnea index (AI) of 5.9 /hour. There were 31 hypopneas with a hypopnea index of 4.4 /hour.      The total APNEA/HYPOPNEA INDEX (AHI) was 10.3/hour.  30 events occurred in REM sleep and 65 events in NREM. The REM AHI was  30.8 /hour, versus a non-REM AHI of 7. The patient spent 194 minutes of total sleep time in the supine position and 225 minutes in  non-supine. The supine AHI was 10.8 versus a non-supine AHI of 9.9.  OXYGEN SATURATION & C02:  The Wake baseline 02 saturation was 90%, with the lowest being 81%.  Time spent below 89% saturation equaled 23 minutes.  The arousals were noted as: 69 were spontaneous, 2 were associated with PLMs, 20 were associated with respiratory events. The patient had a total of 6 Periodic  Limb Movements.  The Periodic Limb Movement (PLM) Arousal index was 0.3/hour. .Audio and video analysis did not show any abnormal or unusual movements, behaviors, phonations or vocalizations.  Very mild Snoring was noted. EKG was in keeping with normal sinus rhythm (NSR).  IMPRESSION: 1. This PSG confirmed a diagnosis of mild Obstructive Sleep Apnea (OSA) at an AHI of 10.3/h and much exacerbated during REM sleep to 30/h.  2. There was hypoxemia noted during the PSG, nadir was 81% and duration was 23 minutes.   RECOMMENDATIONS: 1. Advise full night, attended, CPAP titration study to optimize therapy.  2. PS: (If this is not authorized by insurance carrier, we will need to use auto CPAP and settings from 5-15 cm water, 2 cm EPR and heated humidity with mask of choice. After 30-60 days on CPAP, we will need to obtain an ONO while using CPAP).      I certify that I have reviewed the entire raw data recording prior to the issuance of this report in accordance with the Standards of Accreditation of the American Academy of Sleep Medicine (AASM)  Larey Seat, MD Diplomat, American Board of Psychiatry and Neurology  Diplomat, American Board of Sleep Medicine Market researcher, [place] [facility] [employer]

## 2020-11-20 NOTE — Progress Notes (Signed)
IMPRESSION: 1. This PSG confirmed a diagnosis of mild Obstructive Sleep Apnea (OSA) at an AHI of 10.3/h and much exacerbated during REM sleep to 30/h.  2. There was hypoxemia noted during the PSG, nadir was 81% and duration was 23 minutes.   RECOMMENDATIONS: 1. Advise full night, attended, CPAP titration study to optimize therapy.  2. PS: (If this is not authorized by insurance carrier, we will need to use auto CPAP and settings from 5-15 cm water, 2 cm EPR and heated humidity with mask of choice. After 30-60 days on CPAP, we will need to obtain an ONO while using CPAP).

## 2020-11-21 ENCOUNTER — Telehealth: Payer: Self-pay | Admitting: Neurology

## 2020-11-21 NOTE — Telephone Encounter (Signed)
I called pt. I advised pt that Dr. Brett Fairy reviewed their sleep study results and found that has mild sleep apnea and recommends that pt be treated with a cpap. Dr. Brett Fairy recommends that pt return for a repeat sleep study in order to properly titrate the cpap and ensure a good mask fit. Pt is agreeable to returning for a titration study. I advised pt that our sleep lab will file with pt's insurance and call pt to schedule the sleep study when we hear back from the pt's insurance regarding coverage of this sleep study. Pt verbalized understanding of results. Pt had no questions at this time but was encouraged to call back if questions arise.

## 2020-11-21 NOTE — Telephone Encounter (Signed)
-----   Message from Larey Seat, MD sent at 11/20/2020  6:03 PM EST ----- IMPRESSION: 1. This PSG confirmed a diagnosis of mild Obstructive Sleep Apnea (OSA) at an AHI of 10.3/h and much exacerbated during REM sleep to 30/h.  2. There was hypoxemia noted during the PSG, nadir was 81% and duration was 23 minutes.   RECOMMENDATIONS: 1. Advise full night, attended, CPAP titration study to optimize therapy.  2. PS: (If this is not authorized by insurance carrier, we will need to use auto CPAP and settings from 5-15 cm water, 2 cm EPR and heated humidity with mask of choice. After 30-60 days on CPAP, we will need to obtain an ONO while using CPAP).

## 2020-12-07 ENCOUNTER — Encounter: Payer: Self-pay | Admitting: Neurology

## 2020-12-22 ENCOUNTER — Ambulatory Visit (INDEPENDENT_AMBULATORY_CARE_PROVIDER_SITE_OTHER): Payer: BC Managed Care – PPO | Admitting: Neurology

## 2020-12-22 ENCOUNTER — Other Ambulatory Visit: Payer: Self-pay

## 2020-12-22 DIAGNOSIS — I63031 Cerebral infarction due to thrombosis of right carotid artery: Secondary | ICD-10-CM

## 2020-12-22 DIAGNOSIS — I2510 Atherosclerotic heart disease of native coronary artery without angina pectoris: Secondary | ICD-10-CM

## 2020-12-22 DIAGNOSIS — Z6835 Body mass index (BMI) 35.0-35.9, adult: Secondary | ICD-10-CM

## 2020-12-22 DIAGNOSIS — G4736 Sleep related hypoventilation in conditions classified elsewhere: Secondary | ICD-10-CM

## 2020-12-22 DIAGNOSIS — G4733 Obstructive sleep apnea (adult) (pediatric): Secondary | ICD-10-CM | POA: Diagnosis not present

## 2020-12-22 DIAGNOSIS — I6350 Cerebral infarction due to unspecified occlusion or stenosis of unspecified cerebral artery: Secondary | ICD-10-CM

## 2020-12-22 DIAGNOSIS — I6300 Cerebral infarction due to thrombosis of unspecified precerebral artery: Secondary | ICD-10-CM

## 2020-12-25 ENCOUNTER — Encounter: Payer: Self-pay | Admitting: Neurology

## 2021-01-04 ENCOUNTER — Telehealth: Payer: Self-pay | Admitting: Neurology

## 2021-01-04 ENCOUNTER — Other Ambulatory Visit: Payer: Self-pay | Admitting: Neurology

## 2021-01-04 DIAGNOSIS — G4733 Obstructive sleep apnea (adult) (pediatric): Secondary | ICD-10-CM | POA: Insufficient documentation

## 2021-01-04 DIAGNOSIS — G4736 Sleep related hypoventilation in conditions classified elsewhere: Secondary | ICD-10-CM | POA: Insufficient documentation

## 2021-01-04 NOTE — Procedures (Signed)
PATIENT'S NAME:  Shelley Spencer, Twardowski DOB:      December 29, 1945      MR#:    [id number]     DATE OF RECORDING: 12/22/2020 M. Ronni Rumble M.D.:  Antony Contras, MD/ Dr. Virgina Jock, MD  Study Performed:   Titration to positive airway pressure  HISTORY:  [name] returns for a CPAP titration based on her previous sleep study, which revealed Mild Obstructive Sleep Apnea (OSA) at an AHI of 10.3/h and much exacerbated during REM sleep to 30/h.  Hypoxemia , the nadir was 81% and total desaturation time was 23 minutes.   She was referred by Dr Leonie Man and Dr Virgina Jock for hypoxemia spells while admitted to hospital telemetry.  She has a medical history of CVA, pontine stroke, Anemia, Anxiety, Arthritis, Asthma, Bicornuate uterus, CAD (coronary artery disease), Colon polyp, clinical Depression, Deviated nasal septum, DJD (degenerative joint disease), Fibroids, GERD (gastroesophageal reflux disease), Hematuria, Hiatal hernia, HNP (herniated nucleus pulposus), Hyperlipidemia, Hypertension, MVP (mitral valve prolapse), Pre-diabetes, Retinal tear (2005), and Shingles (1980's).  The patient endorsed the Epworth Sleepiness Scale at 12/24 points.   The patient's weight 192 pounds with a height of 62 (inches), resulting in a BMI of 35.3 kg/m2. The patient's neck circumference measured 15 inches.  CURRENT MEDICATIONS: Tylenol, Ventolin, Praluent, Aspirin, Symbicort, Wellbutrin, Calcium-vitamin D, Zyrtec, Cardura, Lasix, Imdur, Multivitamin, Nitrostat, Benicar, Protonix, Paxil, K-Dur, Desyrel, Vitamin B12    PROCEDURE:  This is a multichannel digital polysomnogram utilizing the SomnoStar 11.2 system.  Electrodes and sensors were applied and monitored per AASM Specifications.   EEG, EOG, Chin and Limb EMG, were sampled at 200 Hz.  ECG, Snore and Nasal Pressure, Thermal Airflow, Respiratory Effort, CPAP Flow and Pressure, Oximetry was sampled at 50 Hz. Digital video and audio were recorded.       CPAP was initiated under  an Eson 2 mask and soon switched to Simplus FFM in small size- pressure at 5 cmH20 at the beginning per AASM standards was advanced to 8 cmH20 because of hypopneas, apneas and desaturations. Oxygen was added after CPAP had reduced the apneas, but did not control hypoxia-   At a final PAP pressure of 8 cmH20, there was a reduction of the AHI to 1.2/h over 152 minutes of sleep- with improvement of sleep apnea and hypoxia under 2 liters 02. REM sleep rebounded at this last applied CPA-pressure.   Lights Out was at 20:44 and Lights On at 05:31. Total recording time (TRT) was 527.5 minutes, with a total sleep time (TST) of 437.5 minutes. The patient's sleep latency was 77.5 minutes. REM latency was 400 minutes.  The sleep efficiency was 82.9 %.    SLEEP ARCHITECTURE: WASO (Wake after sleep onset) was 12.5 minutes.  There were 5.5 minutes in Stage N1, 269 minutes Stage N2, 119 minutes Stage N3 and 44 minutes in Stage REM.  The percentage of Stage N1 was 1.3%, Stage N2 was 61.5%, Stage N3 was 27.2% and Stage R (REM sleep) was 10.1%. The sleep architecture was notable for REM rebound.     RESPIRATORY ANALYSIS:  There was a total of 9 respiratory events: 4 obstructive apneas, 4 central apneas and 1 mixed apneas with a total of 9 apneas and an apnea index (AI) of 1.2 /hour. There were 0 hypopneas with a hypopnea index of 0/hour. The patient also had 0 respiratory event related arousals (RERAs).      The total APNEA/HYPOPNEA INDEX  (AHI) was 1.2 /hour and the total RESPIRATORY DISTURBANCE INDEX  was 1.2 /hour  3 events occurred in REM sleep and 6 events in NREM. The REM AHI was 4.1 /hour versus a non-REM AHI of .9 /hour.  The patient spent 99 minutes of total sleep time in the supine position and 339 minutes in non-supine. The supine AHI was 3.6, versus a non-supine AHI of 0.5.  OXYGEN SATURATION & C02:  The baseline 02 saturation was 93%, with the lowest being 84%. Time spent below 89% saturation equaled 28  minutes.  The arousals were noted as: 16 were spontaneous, 8 were associated with PLMs, 3 were associated with respiratory events.  The patient had a total of 19 Periodic Limb Movements. The Periodic Limb Movement (PLM) Arousal index was 1.1 /hour. PLMs were not seen during REM sleep.  Audio and video analysis did not show any abnormal or unusual movements, behaviors, phonations or vocalizations.   EKG was in keeping with normal sinus rhythm (NSR).    DIAGNOSIS 1. Obstructive Sleep Apnea and Sleep Related Hypoxemia responded to CPAP at 8 cm water with 2 Liters 02/min added.  2. Mild Periodic Limb Movement Disorder   PLANS/RECOMMENDATIONS: auto CPAP with heated humidity at 5-13 cm water, 2 cm EPR and a small size Simplus FFM mask.  1. CPAP therapy compliance is defined as 4 hours or more of nightly use. 2. Any apnea patient should avoid sedatives, hypnotics, and alcohol consumption at bedtime.     DISCUSSION: Both, oxygen and autotitration-device CPAP will be provided by DME and we will follow up after 60-90 days of use. Please remind the patient that a mask can be exchanged at DME within the first 30 days of use and that we have supply-chain problem related waiting periods for machines to be delivered.        A follow up appointment will be scheduled in the Sleep Clinic at [facility].   Please call [phone] with any questions.      I certify that I have reviewed the entire raw data recording prior to the issuance of this report in accordance with the Standards of Accreditation of the American Academy of Sleep Medicine (AASM)      [] Larey Seat, M.D. Diplomat, Tax adviser of Psychiatry and Neurology  Diplomat, Tax adviser of Sleep Medicine Market researcher, [employer] [facility] [employer]

## 2021-01-04 NOTE — Telephone Encounter (Signed)
-----   Message from Larey Seat, MD sent at 01/04/2021  3:54 PM EDT ----- DIAGNOSIS 1. Obstructive Sleep Apnea and Sleep Related Hypoxemia responded to CPAP at 8 cm water with 2 Liters 02/min added.  2. Mild Periodic Limb Movement Disorder   PLANS/RECOMMENDATIONS: auto CPAP with heated humidity at 5-13 cm water, 2 cm EPR and a small size Simplus FFM mask.  1. CPAP therapy compliance is defined as 4 hours or more of nightly use. 2. Any apnea patient should avoid sedatives, hypnotics, and alcohol consumption at bedtime.     DISCUSSION: Both, oxygen and autotitration-device CPAP will be provided by DME and we will follow up after 60-90 days of use. Please remind the patient that a mask can be exchanged at DME within the first 30 days of use and that we have supply-chain problem related waiting periods for machines to be delivered.

## 2021-01-04 NOTE — Addendum Note (Signed)
Addended by: Larey Seat on: 01/04/2021 03:54 PM   Modules accepted: Orders

## 2021-01-04 NOTE — Telephone Encounter (Signed)
I called pt. I advised pt that Dr. Brett Fairy reviewed their sleep study results and found that pt was best treated with auto CPAP and oxygen. Dr. Brett Fairy recommends that pt start auto CPAP. I reviewed PAP compliance expectations with the pt. Pt is agreeable to starting a CPAP. I advised pt that an order will be sent to a DME, [facility] ([facility] and [facility] ([place]) will call the pt within about one week after they file with the pt's insurance. [facility]) will show the pt how to use the machine, fit for masks, and troubleshoot the CPAP if needed. A follow up appt was made for insurance purposes with Dr. Brett Fairy on 04/30/21 at 9:30 am. Pt verbalized understanding to arrive 15 minutes early and bring their CPAP. A letter with all of this information in it will be mailed to the pt as a reminder. I verified with the pt that the address we have on file is correct. Pt verbalized understanding of results. Pt had no questions at this time but was encouraged to call back if questions arise. I have sent the order to [facility]) and have received confirmation that they have received the order.

## 2021-01-04 NOTE — Progress Notes (Signed)
DIAGNOSIS 1. Obstructive Sleep Apnea and Sleep Related Hypoxemia responded to CPAP at 8 cm water with 2 Liters 02/min added.  2. Mild Periodic Limb Movement Disorder   PLANS/RECOMMENDATIONS: auto CPAP with heated humidity at 5-13 cm water, 2 cm EPR and a small size Simplus FFM mask.  1. CPAP therapy compliance is defined as 4 hours or more of nightly use. 2. Any apnea patient should avoid sedatives, hypnotics, and alcohol consumption at bedtime.     DISCUSSION: Both, oxygen and autotitration-device CPAP will be provided by DME and we will follow up after 60-90 days of use. Please remind the patient that a mask can be exchanged at DME within the first 30 days of use and that we have supply-chain problem related waiting periods for machines to be delivered.

## 2021-02-06 ENCOUNTER — Encounter: Payer: Self-pay | Admitting: Neurology

## 2021-02-07 ENCOUNTER — Other Ambulatory Visit: Payer: Self-pay | Admitting: Neurology

## 2021-02-07 DIAGNOSIS — I6350 Cerebral infarction due to unspecified occlusion or stenosis of unspecified cerebral artery: Secondary | ICD-10-CM

## 2021-02-07 DIAGNOSIS — G4733 Obstructive sleep apnea (adult) (pediatric): Secondary | ICD-10-CM

## 2021-02-07 DIAGNOSIS — I6300 Cerebral infarction due to thrombosis of unspecified precerebral artery: Secondary | ICD-10-CM

## 2021-02-26 NOTE — Telephone Encounter (Signed)
02/26/21 message sent to [facility] asking about this. Order in system states 2 L of 02 should be bled into the cpap machine.

## 2021-03-05 NOTE — Progress Notes (Signed)
Primary Physician:  Crist Infante, MD   Patient ID: Shelley Spencer, female    DOB: 1946/03/27, 75 y.o.   MRN: [id number]  Subjective:    Chief Complaint  Patient presents with   Coronary Artery Disease   Follow-up    HPI: Shelley Spencer  is a 75 y.o. female  with asthma, hypertension, hyperlipidemia, hyperglycemia, CKD stage 3, former tobacco use with moderate CAD with LAD 50-60% stenosis by coronary CTA on 03/30/2019  (not significant stenosis by FFR), moderate AR by echo in July 2020.  She is a former smoker that quit in 1981. Patient was admitted to the hospital on 03/14/2020 with gait disturbance involving her left lower extremity, had a right pontine stroke.  Fortunately she has recuperated well from this.  MRI showed moderate chronic small vessel disease.  Patient is statin intolerant, and has previously been on both Zetia and Vascepa.  She stopped Zetia due to muscle aches.  Patient stopped Vascepa due to constipation.  Patient presents for 73-monthfollow-up of hypertension, hyperlipidemia.  Last visit referred patient for further evaluation of underlying sleep apnea.  Patient was evaluated by Dr. DBrett Fairywho recommended CPAP use for obstructive sleep apnea. Also since last visit patient was seen by her PCP with concerns of lightheadedness and was noted to be orthostatic. Therefore PCP stopped olmesartan and doxazosin.   Patient has had no recurrence of dizziness since discontinuing these medications, however she brings with her a written log of home BP readings which are uncontrolled, as high as 183/95 mmHg. She is now using her CPAP nightly and has had significant improvement in energy and stamina.   Patient has had no recurrence of anginal.  Denies palpitations, leg swelling, orthopnea, PND.  She reports her shortness of breath has also significantly improved with use of CPAP as well as increased physical activity.  Patient does not have a formal exercise routine, however her 238year-old  grandson is presently living with her and she is active watching him and keeping her house.   Past Medical History:  Diagnosis Date   Anemia    Anxiety    Arthritis    Asthma    Bicornuate uterus    double cervix   CAD (coronary artery disease)    Colon polyp    Depression    Deviated septum    DJD (degenerative joint disease)    L-Spine   Fibroids    GERD (gastroesophageal reflux disease)    Hematuria    Hiatal hernia    HNP (herniated nucleus pulposus)    lower back   Hyperlipidemia    Hypertension    MVP (mitral valve prolapse)    pt denies, pt states she had a Echo ?2008 and it did not show MVP   Pre-diabetes    Retinal tear 2005   Small right retinal tear   Shingles 1980's   on her waist    Past Surgical History:  Procedure Laterality Date   ANTERIOR CERVICAL DECOMP/DISCECTOMY FUSION N/A 09/05/2017   Procedure: Cervical four-five, Cervical five-six, Cervical six-seven Anterior cervical decompression/discectomy/fusion;  Surgeon: SErline Levine MD;  Location[facility]  Service: Neurosurgery;  Laterality: N/A;   bone spur surgery Bilateral    x 3   CARDIOVASCULAR STRESS TEST  02/08/2014   COLONOSCOPY     CYST EXCISION Left 09/29/2018   Procedure: LEFT MIDDLE FINGER CYST REMOVAL AND DEBRIDEMENT OF DISTAL INTERPHALANGEAL JOINT;  Surgeon: KDaryll Brod MD;  Location[place] [facility]  Service:  Orthopedics;  Laterality: Left;   FINGER SURGERY Left    left third finger mucoid cyst removed   FOOT NEUROMA SURGERY Bilateral    HYSTEROSCOPY     D&C PMB Endo Cx Polyp   KNEE ARTHROSCOPY WITH MENISCAL REPAIR Right 2016   --Flossie Dibble, MD   LAPAROSCOPIC HYSTERECTOMY  7/209/10   R-TLH/BSO  Fibroids, BAck Pain, Adenomyosis   LUMBAR FUSION     removal vaginal septum     TOTAL KNEE ARTHROPLASTY Right 05/17/2019   Procedure: TOTAL KNEE ARTHROPLASTY;  Surgeon: Gaynelle Arabian, MD;  Location: [facility];  Service: Orthopedics;  Laterality: Right;  79mn   TUBAL LIGATION      Family History  Problem Relation Age of Onset   Heart failure Father        Possibly related to EtOH   Alcohol abuse Father    Emphysema Father    Emphysema Mother    Heart failure Mother        Possibly related to EtOH   Diabetes Mother    Alcohol abuse Mother    Valvular heart disease Brother    Bipolar disorder Brother    Crohn's disease Daughter    Thyroid disease Daughter    Colon cancer Neg Hx    Stomach cancer Neg Hx    Esophageal cancer Neg Hx    Rectal cancer Neg Hx    Liver cancer Neg Hx    Stroke Neg Hx     Social History   Tobacco Use   Smoking status: Former    Packs/day: 1.00    Years: 20.00    Pack years: 20.00    Types: Cigarettes    Quit date: 09/24/1979    Years since quitting: 41.4   Smokeless tobacco: Never  Substance Use Topics   Alcohol use: Not Currently    Alcohol/week: 1.0 standard drink    Types: 1 Standard drinks or equivalent per week    Marital status: Married   ROS   Review of Systems  Constitutional: Negative for malaise/fatigue (resolved) and weight gain.  Cardiovascular:  Positive for dyspnea on exertion (improving). Negative for chest pain, claudication, leg swelling, near-syncope, orthopnea, palpitations, paroxysmal nocturnal dyspnea and syncope.  Respiratory:  Negative for shortness of breath.   Hematologic/Lymphatic: Does not bruise/bleed easily.  Gastrointestinal:  Negative for melena.  Neurological:  Negative for dizziness and weakness.  Objective:  Blood pressure 140/72, pulse 78, temperature 97.7 F (36.5 C), height _0  (1.575 m), weight 188 lb (85.3 kg), SpO2 96 %. Body mass index is 34.39 kg/m.   Vitals with BMI 03/06/2021 03/06/2021 10/18/2020  Height - _1  _2   Weight - 188 lbs 192 lbs  BMI - 348.18356.31 Systolic [id number] Diastolic 72 80 69  Pulse 78 80 91     Orthostatic VS for the past 72 hrs (Last 3 readings):  Orthostatic BP Patient Position BP Location Cuff Size Orthostatic Pulse  03/06/21 0920  137/77 Sitting Left Arm Large 76  03/06/21 0919 (!) 168/96 Sitting Left Arm Large 77  03/06/21 0918 171/83 Supine Left Arm Large 75    Physical Exam Vitals reviewed.  Constitutional:      Appearance: She is well-developed.     Comments: Moderately obese  HENT:     Head: Normocephalic and atraumatic.  Cardiovascular:     Rate and Rhythm: Normal rate and regular rhythm.     Pulses: Intact distal pulses.  Carotid pulses are 2+ on the right side with bruit and 2+ on the left side with bruit.      Radial pulses are 2+ on the right side and 2+ on the left side.       Dorsalis pedis pulses are 2+ on the right side and 2+ on the left side.       Posterior tibial pulses are 1+ on the right side and 1+ on the left side.     Heart sounds: S1 normal and S2 normal. Murmur heard.  High-pitched decrescendo early diastolic murmur is present with a grade of 2/4 at the upper right sternal border.    No gallop.     Comments: No JVD. Pulmonary:     Effort: Pulmonary effort is normal. No accessory muscle usage or respiratory distress.     Breath sounds: Normal breath sounds. No wheezing, rhonchi or rales.  Musculoskeletal:        General: Normal range of motion.     Right lower leg: No edema.     Left lower leg: No edema.  Neurological:     Mental Status: She is alert.  Psychiatric:        Behavior: Behavior is cooperative.   Laboratory examination:    CMP Latest Ref Rng & Units 08/13/2020 03/15/2020 03/14/2020  Glucose 70 - 99 mg/dL 91 - 101(H)  BUN 8 - 23 mg/dL 19 - 20  Creatinine 0.44 - 1.00 mg/dL 1.35(H) - 1.49(H)  Sodium 135 - 145 mmol/L 142 - 143  Potassium 3.5 - 5.1 mmol/L 3.7 - 3.4(L)  Chloride 98 - 111 mmol/L 103 - 104  CO2 22 - 32 mmol/L 29 - 27  Calcium 8.9 - 10.3 mg/dL 9.3 - 9.0  Total Protein 6.5 - 8.1 g/dL - 6.6 -  Total Bilirubin 0.3 - 1.2 mg/dL - 0.6 -  Alkaline Phos 38 - 126 U/L - 90 -  AST 15 - 41 U/L - 31 -  ALT 0 - 44 U/L - 18 -   CBC Latest Ref Rng & Units  08/13/2020 03/14/2020 05/18/2019  WBC 4.0 - 10.5 K/uL 7.4 7.3 10.2  Hemoglobin 12.0 - 15.0 g/dL 12.4 11.3(L) 10.5(L)  Hematocrit 36.0 - 46.0 % 40.4 37.7 34.4(L)  Platelets 150 - 400 K/uL 252 231 217   Lipid Panel Recent Labs    03/15/20 0803 09/11/20 1031  CHOL 160 148  TRIG 101 103  LDLCALC 77 63  VLDL 20  --   HDL 63 66  CHOLHDL 2.5  --      HEMOGLOBIN A1C Lab Results  Component Value Date   HGBA1C 6.1 (H) 03/15/2020   MPG 128.37 03/15/2020   TSH Recent Labs    03/15/20 0853  TSH 2.500     Vitamin D 1, 25 (OH)2 Total 12/28/2019 pg/mL 34     External labs:  05/07/2019: Creatinine 1.1, EGFR 49/59, potassium 4.1, BMP normal.  Normal H&H, MCH low at 25.5, MCHC 30.0, CBC otherwise normal.  Cholesterol 203, triglycerides 107, HDL 64, LDL 118. TSH normal.   Allergies   Allergies  Allergen Reactions   Amlodipine Other (See Comments) and Swelling    Legs swell some and she doesn't like it.   Fenofibrate Other (See Comments)    Leg pain   Omeprazole Other (See Comments)    Unknown   Simvastatin Other (See Comments)    Myalgia, leg pains   Singulair [Montelukast Sodium] Other (See Comments)    Affected mood   Statins  Other (See Comments)    Myalgia, leg pains      Medications Prior to Visit:   Outpatient Medications Prior to Visit  Medication Sig Dispense Refill   acetaminophen (TYLENOL) 650 MG CR tablet Take 1,300 mg by mouth every 8 (eight) hours as needed for pain.      albuterol (VENTOLIN HFA) 108 (90 Base) MCG/ACT inhaler Inhale 1-2 puffs into the lungs every 6 (six) hours as needed for wheezing or shortness of breath.      Alirocumab (PRALUENT) 150 MG/ML SOAJ Inject 1 Syringe into the skin every 14 (fourteen) days. 6 mL 3   budesonide-formoterol (SYMBICORT) 160-4.5 MCG/ACT inhaler Inhale 2 puffs into the lungs daily.     buPROPion (WELLBUTRIN XL) 150 MG 24 hr tablet Take 150 mg by mouth daily.     CALCIUM-VITAMIN D PO Take 1 tablet by mouth daily.      cetirizine (ZYRTEC) 10 MG tablet Take 10 mg by mouth daily.     clopidogrel (PLAVIX) 75 MG tablet Take 1 tablet (75 mg total) by mouth daily. 90 tablet 3   furosemide (LASIX) 40 MG tablet Take 20 mg by mouth daily.     magnesium gluconate (MAGONATE) 500 MG tablet Take 500 mg by mouth daily.     Multiple Vitamins-Minerals (MULTIVITAMIN PO) Take 1 tablet by mouth daily.     nitroGLYCERIN (NITROSTAT) 0.4 MG SL tablet Place 1 tablet (0.4 mg total) under the tongue every 5 (five) minutes as needed for chest pain. 30 tablet 2   NON FORMULARY CPAP     pantoprazole (PROTONIX) 40 MG tablet Take 1 tablet (40 mg total) by mouth 2 (two) times daily. (Patient taking differently: Take 40 mg by mouth every evening.) 180 tablet 3   PARoxetine (PAXIL) 40 MG tablet Take 40 mg by mouth every evening.      potassium chloride (KLOR-CON) 10 MEQ tablet Take 10 mEq by mouth daily.     traZODone (DESYREL) 150 MG tablet Take 150 mg by mouth at bedtime.     vitamin B-12 (CYANOCOBALAMIN) 1000 MCG tablet Take 1,000 mcg by mouth daily.     potassium chloride SA (K-DUR) 20 MEQ tablet Take 20 mEq by mouth daily.      ALPRAZolam (XANAX) 0.25 MG tablet Take 0.25 mg by mouth 3 (three) times daily as needed.     gabapentin (NEURONTIN) 300 MG capsule Take 1 capsule by mouth daily.     isosorbide mononitrate (IMDUR) 30 MG 24 hr tablet Take 1 tablet (30 mg total) by mouth daily. 90 tablet 3   doxazosin (CARDURA) 4 MG tablet Take 4 mg by mouth daily.  (Patient not taking: Reported on 03/06/2021)     olmesartan (BENICAR) 40 MG tablet Take 40 mg by mouth every evening. (Patient not taking: Reported on 03/06/2021)     No facility-administered medications prior to visit.     Final Medications at End of Visit    Current Meds  Medication Sig   acetaminophen (TYLENOL) 650 MG CR tablet Take 1,300 mg by mouth every 8 (eight) hours as needed for pain.    albuterol (VENTOLIN HFA) 108 (90 Base) MCG/ACT inhaler Inhale 1-2 puffs into the lungs  every 6 (six) hours as needed for wheezing or shortness of breath.    Alirocumab (PRALUENT) 150 MG/ML SOAJ Inject 1 Syringe into the skin every 14 (fourteen) days.   budesonide-formoterol (SYMBICORT) 160-4.5 MCG/ACT inhaler Inhale 2 puffs into the lungs daily.   buPROPion (WELLBUTRIN XL) 150 MG  24 hr tablet Take 150 mg by mouth daily.   CALCIUM-VITAMIN D PO Take 1 tablet by mouth daily.   cetirizine (ZYRTEC) 10 MG tablet Take 10 mg by mouth daily.   clopidogrel (PLAVIX) 75 MG tablet Take 1 tablet (75 mg total) by mouth daily.   furosemide (LASIX) 40 MG tablet Take 20 mg by mouth daily.   magnesium gluconate (MAGONATE) 500 MG tablet Take 500 mg by mouth daily.   Multiple Vitamins-Minerals (MULTIVITAMIN PO) Take 1 tablet by mouth daily.   nitroGLYCERIN (NITROSTAT) 0.4 MG SL tablet Place 1 tablet (0.4 mg total) under the tongue every 5 (five) minutes as needed for chest pain.   NON FORMULARY CPAP   olmesartan (BENICAR) 20 MG tablet Take 1 tablet (20 mg total) by mouth daily.   pantoprazole (PROTONIX) 40 MG tablet Take 1 tablet (40 mg total) by mouth 2 (two) times daily. (Patient taking differently: Take 40 mg by mouth every evening.)   PARoxetine (PAXIL) 40 MG tablet Take 40 mg by mouth every evening.    potassium chloride (KLOR-CON) 10 MEQ tablet Take 10 mEq by mouth daily.   traZODone (DESYREL) 150 MG tablet Take 150 mg by mouth at bedtime.   vitamin B-12 (CYANOCOBALAMIN) 1000 MCG tablet Take 1,000 mcg by mouth daily.   [DISCONTINUED] potassium chloride SA (K-DUR) 20 MEQ tablet Take 20 mEq by mouth daily.     Radiology  No results found.  MR angio of the neck and head 03/15/2020 without contrast: 1. No intracranial large vessel occlusion or proximal high-grade arterial stenosis. 2. 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 3. Acute right paramedian pontine infarct.  CT angio chest, abdomen, pelvis 08/13/2020: 1. No acute aortic abnormality. 2.  Mild to moderate aortic atherosclerosis. 3. 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 4. 2 cm slightly hyperdense lesion off the lower pole of the right kidney which could represent a hemorrhagic/proteinaceous cyst. If further evaluation is required would recommend dedicated renal ultrasound. 5.  Aortic Atherosclerosis (ICD10-I70.0).  Chest x-ray 08/13/2020: No active cardiopulmonary disease  Cardiac Studies:   Coronary CTA 03/30/2019:  1. Coronary calcium score of 33. This was 44 percentile for age and sex matched control. 2. Normal coronary origin with left dominance. 3. Moderate plaque with stenosis 50-69% in the mid LAD. CAD RADS 3. Additional analysis with CT FFR will be submitted.  Coronary CTA FFR 03/30/2019:  1. Left Main: No significant stenosis. 2. LAD: No significant stenosis. 3. LCX: No significant stenosis. 4. RCA: No significant stenosis. IMPRESSION: 1.  CT FFR analysis didn't show any significant stenosis.  Echocardiogram 03/15/2020   1. Technically difficult echo with poor image quality.   2. Left ventricular ejection fraction, by estimation, is 65 to 70%. The  left ventricle has normal function. The left ventricle has no regional  wall motion abnormalities. Left ventricular diastolic parameters are  consistent with Grade I diastolic dysfunction (impaired relaxation).   3. Right ventricular systolic function is normal. The right ventricular  size is normal.   4. The mitral valve is grossly normal. No evidence of mitral valve  regurgitation.   5. The aortic valve is grossly normal. Aortic valve regurgitation is not  visualized. No aortic stenosis is present.  Compared to 04/23/2019, moderate aortic regurgitation could not be visualized due to poor echo window.  Otherwise no significant change  Carotid duplex 08/22/2020: Minimal stenosis in the right internal carotid artery (1-15%). Stenosis in the right  external carotid artery (>50%).  Minimal  stenosis in the left internal carotid artery (1-15%). Stenosis in the left external carotid artery (<50%).  Antegrade right vertebral artery flow. Antegrade left vertebral artery flow.  External carotid stenosis may be the source of bruit.  Follow up studies is appropriate if clinically indicated.  PCV MYOCARDIAL PERFUSION WO LEXISCAN 08/21/2020 Normal ECG stress. The patient exercised for 5 minutes and 0 seconds of a Bruce protocol, achieving approximately 6.83 METs.  Normal blood pressure response.  Accelerated heart rate response suggest aerobic intolerance.  Stress terminated due to dyspnea and fatigue. Myocardial perfusion: There is mild breast tissue attenuation inferiorly. There is no ischemia or scar. Overall LV systolic function is abnormal without regional wall motion abnormalities. Stress LV EF: 47%. Visually appears normal. The LV size was small and may be contributing to calculation of LVEF error. No previous exam available for comparison. Low risk.  EKG   03/06/2021: Sinus rhythm at a rate of 72 bpm.  Left atrial enlargement.  Normal axis.  No evidence of ischemia or underlying injury pattern.  Compared to EKG 09/05/2020, no PR WP noted.  Assessment:     ICD-10-CM   1. Coronary artery disease of native artery of native heart with stable angina pectoris ([place])  I25.118 EKG 12-Lead    2. Benign essential HTN  K59 Basic metabolic panel    3. Hypercholesteremia  E78.00     4. Statin intolerance  Z78.9     5. Bilateral carotid artery stenosis  I65.23      Meds ordered this encounter  Medications   olmesartan (BENICAR) 20 MG tablet    Sig: Take 1 tablet (20 mg total) by mouth daily.    Dispense:  30 tablet    Refill:  3   Medications Discontinued During This Encounter  Medication Reason   doxazosin (CARDURA) 4 MG tablet Discontinued by provider   olmesartan (BENICAR) 40 MG tablet Dose change   potassium chloride SA (K-DUR) 20 MEQ tablet Dose change    Recommendations:    Shelley Spencer  is a 75 y.o. female  with hypertension, hyperlipidemia, hyperglycemia, CKD stage 3, former tobacco use with moderate CAD with LAD 50-60% stenosis by coronary CTA on 03/30/2019  (not significant stenosis by FFR), moderate AR by echo in July 2020.  She is a former smoker that quit in 1981.   Patient was admitted to the hospital on 03/14/2020 with gait disturbance involving her left lower extremity, had a right pontine stroke.  Fortunately she has recuperated well from this.  MRI showed moderate chronic small vessel disease. Of note she is statin intolerant.   Patient presents for 61-monthfollow-up of hypertension, hyperlipidemia.  Last visit referred patient for further evaluation of underlying sleep apnea.  Patient was evaluated by Dr. DBrett Fairywho recommended CPAP use for obstructive sleep apnea.  Patient is using CPAP nightly and has noticed a significant improvement in fatigue and shortness of breath.  Lipids are well controlled and patient remains relatively asymptomatic, she has had no recurrence of angina.  We will continue Praluent, Plavix, Lasix 20 mg daily, isosorbide mononitrate.  In regard to hypertension, patient's blood pressure is uncontrolled and although blood pressure drops upon standing, it remains >130/80 mmHg and patient was asymptomatic. Will therefore restart olmesartan at half of her previous dose. Start olmesartan 20 mg daily with repeat BMP in 2 weeks. Patient will continue to monitor blood pressure on a daily basis both sitting and standing. Patient will notify  our office if blood pressure remains >140/90 mmHg or if she develops recurrence of lightheadedness.   In regard to carotid artery disease, will repeat duplex in about 6 months. Notably in regard to CAD, patient is not presently on beta blockers therapy  in view of continued uncontrolled blood pressure and aortic regurgitations, she was previously on metoprolol. Will plan to repeat echocardiogram following next  visit and reconsider beta blocker therapy if appropriate.   Follow up in 6 months, sooner if needed, for hypertension, hyperlipidemia, CAD, AR, carotid artery disease.    Alethia Berthold, PA-C 03/06/2021, 9:39 AM Office: [phone]

## 2021-03-06 ENCOUNTER — Ambulatory Visit: Payer: Medicare Other | Admitting: Student

## 2021-03-06 ENCOUNTER — Encounter: Payer: Self-pay | Admitting: Student

## 2021-03-06 ENCOUNTER — Encounter: Payer: Self-pay | Admitting: Physician Assistant

## 2021-03-06 ENCOUNTER — Other Ambulatory Visit: Payer: Self-pay

## 2021-03-06 VITALS — BP 140/72 | HR 78 | Temp 97.7°F | Ht 62.0 in | Wt 188.0 lb

## 2021-03-06 DIAGNOSIS — I25118 Atherosclerotic heart disease of native coronary artery with other forms of angina pectoris: Secondary | ICD-10-CM

## 2021-03-06 DIAGNOSIS — I6523 Occlusion and stenosis of bilateral carotid arteries: Secondary | ICD-10-CM

## 2021-03-06 DIAGNOSIS — Z789 Other specified health status: Secondary | ICD-10-CM

## 2021-03-06 DIAGNOSIS — E7800 Pure hypercholesterolemia, unspecified: Secondary | ICD-10-CM

## 2021-03-06 DIAGNOSIS — I10 Essential (primary) hypertension: Secondary | ICD-10-CM

## 2021-03-06 MED ORDER — OLMESARTAN MEDOXOMIL 20 MG PO TABS: 20.0000 mg | ORAL_TABLET | Freq: Every day | ORAL | 3 refills | Status: DC

## 2021-03-07 ENCOUNTER — Telehealth: Payer: Self-pay | Admitting: Neurology

## 2021-03-07 DIAGNOSIS — I6300 Cerebral infarction due to thrombosis of unspecified precerebral artery: Secondary | ICD-10-CM

## 2021-03-07 DIAGNOSIS — G4733 Obstructive sleep apnea (adult) (pediatric): Secondary | ICD-10-CM

## 2021-03-07 NOTE — Telephone Encounter (Signed)
Called the patient because we were able to discuss the concerns regarding her using oxygen while on cpap. After looking through the technical data and discussing with the adapt health manager and sleep lab manager we were unable to show the correct documentation from our sleep study that allowed insurance to cover the oxygen.  The patient was set up on a I breeze. Advised the patient since insurance will not use the information provided to cover the oxygen we can complete a overnight oximetry test while she uses the CPAP. If the patient's shows she still needs oxygen after completing the ONO on PAP then she will be required to repeat another test to show documentation of titrating the oxygen.  She verbalized understanding of the plan.

## 2021-03-08 ENCOUNTER — Ambulatory Visit: Payer: Medicare Other | Admitting: Cardiology

## 2021-03-22 ENCOUNTER — Encounter (HOSPITAL_BASED_OUTPATIENT_CLINIC_OR_DEPARTMENT_OTHER): Payer: Self-pay | Admitting: Radiology

## 2021-03-22 ENCOUNTER — Emergency Department (HOSPITAL_BASED_OUTPATIENT_CLINIC_OR_DEPARTMENT_OTHER)
Admission: EM | Admit: 2021-03-22 | Discharge: 2021-03-22 | Disposition: A | Discharge status: Home / Self Care | Payer: Medicare Other | Attending: Emergency Medicine | Admitting: Emergency Medicine

## 2021-03-22 ENCOUNTER — Other Ambulatory Visit: Payer: Self-pay

## 2021-03-22 ENCOUNTER — Emergency Department (HOSPITAL_BASED_OUTPATIENT_CLINIC_OR_DEPARTMENT_OTHER): Payer: Medicare Other

## 2021-03-22 DIAGNOSIS — Z79899 Other long term (current) drug therapy: Secondary | ICD-10-CM | POA: Diagnosis not present

## 2021-03-22 DIAGNOSIS — Z87891 Personal history of nicotine dependence: Secondary | ICD-10-CM | POA: Diagnosis not present

## 2021-03-22 DIAGNOSIS — J45909 Unspecified asthma, uncomplicated: Secondary | ICD-10-CM | POA: Diagnosis not present

## 2021-03-22 DIAGNOSIS — Z7951 Long term (current) use of inhaled steroids: Secondary | ICD-10-CM | POA: Insufficient documentation

## 2021-03-22 DIAGNOSIS — R42 Dizziness and giddiness: Secondary | ICD-10-CM | POA: Diagnosis not present

## 2021-03-22 DIAGNOSIS — N183 Chronic kidney disease, stage 3 unspecified: Secondary | ICD-10-CM | POA: Diagnosis not present

## 2021-03-22 DIAGNOSIS — Z7902 Long term (current) use of antithrombotics/antiplatelets: Secondary | ICD-10-CM | POA: Insufficient documentation

## 2021-03-22 DIAGNOSIS — M542 Cervicalgia: Secondary | ICD-10-CM | POA: Insufficient documentation

## 2021-03-22 DIAGNOSIS — I2510 Atherosclerotic heart disease of native coronary artery without angina pectoris: Secondary | ICD-10-CM | POA: Diagnosis not present

## 2021-03-22 DIAGNOSIS — I129 Hypertensive chronic kidney disease with stage 1 through stage 4 chronic kidney disease, or unspecified chronic kidney disease: Secondary | ICD-10-CM | POA: Insufficient documentation

## 2021-03-22 DIAGNOSIS — Z96651 Presence of right artificial knee joint: Secondary | ICD-10-CM | POA: Insufficient documentation

## 2021-03-22 DIAGNOSIS — H538 Other visual disturbances: Secondary | ICD-10-CM | POA: Diagnosis not present

## 2021-03-22 LAB — CBC WITH DIFFERENTIAL/PLATELET
Abs Immature Granulocytes: 0.03 10*3/uL (ref 0.00–0.07)
Basophils Absolute: 0.1 10*3/uL (ref 0.0–0.1)
Basophils Relative: 1 %
Eosinophils Absolute: 0.2 10*3/uL (ref 0.0–0.5)
Eosinophils Relative: 4 %
HCT: 46.8 % — ABNORMAL HIGH (ref 36.0–46.0)
Hemoglobin: 14.6 g/dL (ref 12.0–15.0)
Immature Granulocytes: 1 %
Lymphocytes Relative: 29 %
Lymphs Abs: 1.7 10*3/uL (ref 0.7–4.0)
MCH: 26.2 pg (ref 26.0–34.0)
MCHC: 31.2 g/dL (ref 30.0–36.0)
MCV: 83.9 fL (ref 80.0–100.0)
Monocytes Absolute: 0.4 10*3/uL (ref 0.1–1.0)
Monocytes Relative: 7 %
Neutro Abs: 3.4 10*3/uL (ref 1.7–7.7)
Neutrophils Relative %: 58 %
Platelets: 284 10*3/uL (ref 150–400)
RBC: 5.58 MIL/uL — ABNORMAL HIGH (ref 3.87–5.11)
RDW: 14.3 % (ref 11.5–15.5)
WBC: 5.8 10*3/uL (ref 4.0–10.5)
nRBC: 0 % (ref 0.0–0.2)

## 2021-03-22 LAB — COMPREHENSIVE METABOLIC PANEL
ALT: 17 U/L (ref 0–44)
AST: 17 U/L (ref 15–41)
Albumin: 4.2 g/dL (ref 3.5–5.0)
Alkaline Phosphatase: 103 U/L (ref 38–126)
Anion gap: 9 (ref 5–15)
BUN: 16 mg/dL (ref 8–23)
CO2: 29 mmol/L (ref 22–32)
Calcium: 10 mg/dL (ref 8.9–10.3)
Chloride: 105 mmol/L (ref 98–111)
Creatinine, Ser: 1.12 mg/dL — ABNORMAL HIGH (ref 0.44–1.00)
GFR, Estimated: 52 mL/min — ABNORMAL LOW (ref >60)
Glucose, Bld: 100 mg/dL — ABNORMAL HIGH (ref 70–99)
Potassium: 4 mmol/L (ref 3.5–5.1)
Sodium: 143 mmol/L (ref 135–145)
Total Bilirubin: 0.4 mg/dL (ref 0.3–1.2)
Total Protein: 7.2 g/dL (ref 6.5–8.1)

## 2021-03-22 LAB — TROPONIN I (HIGH SENSITIVITY)
Troponin I (High Sensitivity): 5 ng/L (ref <18)
Troponin I (High Sensitivity): 4 ng/L (ref <18)

## 2021-03-22 MED ORDER — ONDANSETRON HCL 4 MG PO TABS: 4.0000 mg | ORAL_TABLET | Freq: Four times a day (QID) | ORAL | 0 refills | Status: DC

## 2021-03-22 MED ORDER — MECLIZINE HCL 25 MG PO TABS: 25.0000 mg | ORAL_TABLET | Freq: Three times a day (TID) | ORAL | 0 refills | Status: DC | PRN

## 2021-03-22 MED ORDER — IOHEXOL 350 MG/ML SOLN
75.0000 mL | Freq: Once | INTRAVENOUS | Status: AC | PRN
  Administered 2021-03-22: 75 mL via INTRAVENOUS

## 2021-03-22 MED ORDER — HYDRALAZINE HCL 25 MG PO TABS
25.0000 mg | ORAL_TABLET | Freq: Once | ORAL | Status: AC
  Administered 2021-03-22: 25 mg via ORAL
  Filled 2021-03-22: qty 1

## 2021-03-22 MED ORDER — OLMESARTAN MEDOXOMIL 20 MG PO TABS: 40.0000 mg | ORAL_TABLET | Freq: Every day | ORAL | 3 refills | Status: DC

## 2021-03-22 MED ORDER — MECLIZINE HCL 25 MG PO TABS
25.0000 mg | ORAL_TABLET | Freq: Once | ORAL | Status: AC
  Administered 2021-03-22: 25 mg via ORAL
  Filled 2021-03-22: qty 1

## 2021-03-22 NOTE — ED Triage Notes (Signed)
Pt reports having dizziness and blurry at approximately 0900 this morning. Pt states she still has some blurry vision but denis N/V. Pt states she does have a slight headache in the back of her head.

## 2021-03-22 NOTE — ED Provider Notes (Signed)
[facility] EMERGENCY DEPT Provider Note   CSN: [id number] Arrival date & time: 03/22/21  1101     History Chief Complaint  Patient presents with   Hypertension    Shelley Spencer is a 75 y.o. female.  HPI     Woke up this AM with dizziness, felt like room was spinning Blood pressure elevated Took off of blood pressure medications due to PCP being concerned she was having orthostatic hypotension, went to Cardiologist she put her back on half dose of olmesartan [place] morning medication but has not really lowered Pain in left side of neck, cramping for last few days, had precervical spine fusion and initially from that RN at Dr. Silvestre Mesi office recommended she come to ED  Had CVA, 02/2020, no deficits (had left sided weakness initially)  Vision with episodes of blurring vision with dizziness Not dizzy now because not moving, is worse with standing and moving Had prior "crystals on ear hairs" with dizziness that improved with exercises althought this felt different-not sure if diagnosed with BPPV/Dr. Joylene Draft had recommended them  If move head or stand would feel dizzy, but not having symptoms now except for neck pain. 4/10 neck and headache No CP/dyspnea    Past Medical History:  Diagnosis Date   Anemia    Anxiety    Arthritis    Asthma    Bicornuate uterus    double cervix   CAD (coronary artery disease)    Colon polyp    Depression    Deviated septum    DJD (degenerative joint disease)    L-Spine   Fibroids    GERD (gastroesophageal reflux disease)    Hematuria    Hiatal hernia    HNP (herniated nucleus pulposus)    lower back   Hyperlipidemia    Hypertension    MVP (mitral valve prolapse)    pt denies, pt states she had a Echo ?2008 and it did not show MVP   Pre-diabetes    Retinal tear 2005   Small right retinal tear   Shingles 1980's   on her waist    Patient Active Problem List   Diagnosis Date Noted   OSA (obstructive sleep apnea)  01/04/2021   Hypoxemia associated with sleep 01/04/2021   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[place]) 11/20/2020   Stable angina ([place]) 08/14/2020   CAD (coronary artery disease) 24/05/7352   Diastolic dysfunction 29/92/4268   Left-sided weakness 03/16/2020   CVA (cerebral vascular accident) ([place]) 03/15/2020   OA (osteoarthritis) of knee 05/17/2019   Herniated cervical disc without myelopathy 09/05/2017   Bronchitis 04/16/2017   Microcytosis 11/12/2013   Screening for depression 11/12/2013   Obesity 10/29/2012   Underimmunization status 06/02/2012   Herpes zona 03/01/2012   LBP (low back pain) 02/27/2012   Atypical chest pain 10/31/2011   Overweight(278.02) 10/31/2011   HLD (hyperlipidemia)    Hypertension    Fatigue 10/30/2011   Laryngeal pain 09/05/2011   Benign neoplasm of colon 08/14/2011   Acute bronchitis 07/08/2011   Allergic rhinitis, seasonal 11/30/2009   Airway hyperreactivity 11/30/2009   DDD (degenerative disc disease) 11/30/2009   Clinical depression 11/30/2009   Benign essential HTN 11/30/2009   Acid reflux 11/30/2009   Other and unspecified general psychiatric examination 11/30/2009   Heart murmur, systolic 34/19/6222   Elevated fasting blood sugar 11/30/2009    Past Surgical History:  Procedure Laterality Date   ANTERIOR CERVICAL DECOMP/DISCECTOMY FUSION  N/A 09/05/2017   Procedure: Cervical four-five, Cervical five-six, Cervical six-seven Anterior cervical decompression/discectomy/fusion;  Surgeon: Erline Levine, MD;  Location: [facility];  Service: Neurosurgery;  Laterality: N/A;   bone spur surgery Bilateral    x 3   CARDIOVASCULAR STRESS TEST  02/08/2014   COLONOSCOPY     CYST EXCISION Left 09/29/2018   Procedure: LEFT MIDDLE FINGER CYST REMOVAL AND DEBRIDEMENT OF DISTAL INTERPHALANGEAL JOINT;  Surgeon: Daryll Brod, MD;  Location: [facility];  Service: Orthopedics;  Laterality: Left;    FINGER SURGERY Left    left third finger mucoid cyst removed   FOOT NEUROMA SURGERY Bilateral    HYSTEROSCOPY     D&C PMB Endo Cx Polyp   KNEE ARTHROSCOPY WITH MENISCAL REPAIR Right 2016   --Flossie Dibble, MD   LAPAROSCOPIC HYSTERECTOMY  7/209/10   R-TLH/BSO  Fibroids, BAck Pain, Adenomyosis   LUMBAR FUSION     removal vaginal septum     TOTAL KNEE ARTHROPLASTY Right 05/17/2019   Procedure: TOTAL KNEE ARTHROPLASTY;  Surgeon: Gaynelle Arabian, MD;  Location: [facility];  Service: Orthopedics;  Laterality: Right;  56min   TUBAL LIGATION       OB History     Gravida  3   Para  2   Term      Preterm      AB  1   Living  2      SAB  1   IAB      Ectopic      Multiple      Live Births              Family History  Problem Relation Age of Onset   Heart failure Father        Possibly related to EtOH   Alcohol abuse Father    Emphysema Father    Emphysema Mother    Heart failure Mother        Possibly related to EtOH   Diabetes Mother    Alcohol abuse Mother    Valvular heart disease Brother    Bipolar disorder Brother    Crohn's disease Daughter    Thyroid disease Daughter    Colon cancer Neg Hx    Stomach cancer Neg Hx    Esophageal cancer Neg Hx    Rectal cancer Neg Hx    Liver cancer Neg Hx    Stroke Neg Hx     Social History   Tobacco Use   Smoking status: Former    Packs/day: 1.00    Years: 20.00    Pack years: 20.00    Types: Cigarettes    Quit date: 09/24/1979    Years since quitting: 41.5   Smokeless tobacco: Never  Vaping Use   Vaping Use: Never used  Substance Use Topics   Alcohol use: Not Currently    Alcohol/week: 1.0 standard drink    Types: 1 Standard drinks or equivalent per week   Drug use: No    Home Medications Prior to Admission medications   Medication Sig Start Date End Date Taking? Authorizing Provider  meclizine (ANTIVERT) 25 MG tablet Take 1 tablet (25 mg total) by mouth 3 (three) times daily as needed for dizziness.  03/22/21  Yes Gareth Morgan, MD  ondansetron (ZOFRAN) 4 MG tablet Take 1 tablet (4 mg total) by mouth every 6 (six) hours. 03/22/21  Yes Gareth Morgan, MD  acetaminophen (TYLENOL) 650 MG CR tablet Take 1,300 mg by mouth every 8 (eight) hours  as needed for pain.     [provider]  albuterol (VENTOLIN HFA) 108 (90 Base) MCG/ACT inhaler Inhale 1-2 puffs into the lungs every 6 (six) hours as needed for wheezing or shortness of breath.     [provider]  Alirocumab (PRALUENT) 150 MG/ML SOAJ Inject 1 Syringe into the skin every 14 (fourteen) days. 11/14/20   Adrian Prows, MD  ALPRAZolam Duanne Moron) 0.25 MG tablet Take 0.25 mg by mouth 3 (three) times daily as needed. 12/21/20   [provider]  budesonide-formoterol (SYMBICORT) 160-4.5 MCG/ACT inhaler Inhale 2 puffs into the lungs daily.    [provider]  buPROPion (WELLBUTRIN XL) 150 MG 24 hr tablet Take 150 mg by mouth daily.    [provider]  CALCIUM-VITAMIN D PO Take 1 tablet by mouth daily.    [provider]  cetirizine (ZYRTEC) 10 MG tablet Take 10 mg by mouth daily.    [provider]  clopidogrel (PLAVIX) 75 MG tablet Take 1 tablet (75 mg total) by mouth daily. 10/23/20   Frann Rider, NP  furosemide (LASIX) 40 MG tablet Take 20 mg by mouth daily. 02/19/20   [provider]  gabapentin (NEURONTIN) 300 MG capsule Take 1 capsule by mouth daily. 03/02/21   [provider]  isosorbide mononitrate (IMDUR) 30 MG 24 hr tablet Take 1 tablet (30 mg total) by mouth daily. 09/05/20   Cantwell, Celeste C, PA-C  magnesium gluconate (MAGONATE) 500 MG tablet Take 500 mg by mouth daily.    [provider]  Multiple Vitamins-Minerals (MULTIVITAMIN PO) Take 1 tablet by mouth daily.    [provider]  nitroGLYCERIN (NITROSTAT) 0.4 MG SL tablet Place 1 tablet (0.4 mg total) under the tongue every 5 (five) minutes as needed for chest pain. 08/14/20   Patwardhan,  Reynold Bowen, MD  NON FORMULARY CPAP    [provider]  olmesartan (BENICAR) 20 MG tablet Take 2 tablets (40 mg total) by mouth daily. 03/22/21   Gareth Morgan, MD  pantoprazole (PROTONIX) 40 MG tablet Take 1 tablet (40 mg total) by mouth 2 (two) times daily. Patient taking differently: Take 40 mg by mouth every evening. 10/16/16   Mauri Pole, MD  PARoxetine (PAXIL) 40 MG tablet Take 40 mg by mouth every evening.     [provider]  potassium chloride (KLOR-CON) 10 MEQ tablet Take 10 mEq by mouth daily.    [provider]  traZODone (DESYREL) 150 MG tablet Take 150 mg by mouth at bedtime.    [provider]  vitamin B-12 (CYANOCOBALAMIN) 1000 MCG tablet Take 1,000 mcg by mouth daily.    [provider]    Allergies    Amlodipine, Fenofibrate, Omeprazole, Simvastatin, Singulair [montelukast sodium], and Statins  Review of Systems   Review of Systems  Constitutional:  Negative for fatigue and fever.  HENT:  Negative for congestion, ear pain and sore throat.   Eyes:  Positive for visual disturbance.  Respiratory:  Negative for cough and shortness of breath.   Cardiovascular:  Negative for chest pain.  Gastrointestinal:  Negative for abdominal pain, diarrhea, nausea and vomiting.  Genitourinary:  Negative for difficulty urinating.  Musculoskeletal:  Positive for neck pain. Negative for back pain.  Skin:  Negative for rash.  Neurological:  Positive for dizziness and headaches. Negative for syncope, facial asymmetry, speech difficulty, weakness and numbness.   Physical Exam Updated Vital Signs BP (!) 196/82   Pulse 75   Temp 98.6 F (  37 C)   Resp 17   Ht 5\' 2"  (1.575 m)   Wt 83.9 kg   LMP  (LMP Unknown)   SpO2 93%   BMI 33.84 kg/m   Physical Exam Vitals and nursing note reviewed.  Constitutional:      General: She is not in acute distress.    Appearance: Normal appearance. She is well-developed. She is not ill-appearing or  diaphoretic.  HENT:     Head: Normocephalic and atraumatic.     Comments: Head impulse testing with deviation with turning to right Eyes:     General: No visual field deficit.    Extraocular Movements: Extraocular movements intact.     Conjunctiva/sclera: Conjunctivae normal.     Pupils: Pupils are equal, round, and reactive to light.  Cardiovascular:     Rate and Rhythm: Normal rate and regular rhythm.     Pulses: Normal pulses.     Heart sounds: Normal heart sounds. No murmur heard.   No friction rub. No gallop.  Pulmonary:     Effort: Pulmonary effort is normal. No respiratory distress.     Breath sounds: Normal breath sounds. No wheezing or rales.  Abdominal:     General: There is no distension.     Palpations: Abdomen is soft.     Tenderness: There is no abdominal tenderness. There is no guarding.  Musculoskeletal:        General: No swelling or tenderness.     Cervical back: Normal range of motion.  Skin:    General: Skin is warm and dry.     Findings: No erythema or rash.  Neurological:     General: No focal deficit present.     Mental Status: She is alert and oriented to person, place, and time.     GCS: GCS eye subscore is 4. GCS verbal subscore is 5. GCS motor subscore is 6.     Cranial Nerves: No cranial nerve deficit, dysarthria or facial asymmetry.     Sensory: No sensory deficit.     Motor: No weakness or tremor.     Coordination: Coordination normal. Finger-Nose-Finger Test normal.     Gait: Gait normal.    ED Results / Procedures / Treatments   Labs (all labs ordered are listed, but only abnormal results are displayed) Labs Reviewed  CBC WITH DIFFERENTIAL/PLATELET - Abnormal; Notable for the following components:      Result Value   RBC 5.58 (*)    HCT 46.8 (*)    All other components within normal limits  COMPREHENSIVE METABOLIC PANEL - Abnormal; Notable for the following components:   Glucose, Bld 100 (*)    Creatinine, Ser 1.12 (*)    GFR,  Estimated 52 (*)    All other components within normal limits  TROPONIN I (HIGH SENSITIVITY)  TROPONIN I (HIGH SENSITIVITY)    EKG EKG Interpretation  Date/Time:  Thursday March 22 2021 11:09:47 EDT Ventricular Rate:  69 PR Interval:  158 QRS Duration: 95 QT Interval:  426 QTC Calculation: 457 R Axis:   9 Text Interpretation: Sinus rhythm Ventricular premature complex Low voltage, precordial leads No significant change since last tracing Confirmed by Gareth Morgan [phone]) on 03/22/2021 2:02:44 PM  Radiology CT Angio Head W or Wo Contrast  Result Date: 03/22/2021 CLINICAL DATA:  Dizziness EXAM: CT ANGIOGRAPHY HEAD AND NECK TECHNIQUE: Multidetector CT imaging of the head and neck was performed using the standard protocol during bolus administration of intravenous contrast. Multiplanar CT image reconstructions and  MIPs were obtained to evaluate the vascular anatomy. Carotid stenosis measurements (when applicable) are obtained utilizing NASCET criteria, using the distal internal carotid diameter as the denominator. CONTRAST:  4mL OMNIPAQUE IOHEXOL 350 MG/ML SOLN COMPARISON:  Correlation made with MRA 03/15/2020 FINDINGS: CT HEAD Brain: There is no acute intracranial hemorrhage, mass effect, or edema. Gray-white differentiation is preserved. There is no extra-axial fluid collection. No hydrocephalus. Patchy low-attenuation in the supratentorial white matter is nonspecific probably reflects mild chronic microvascular ischemic changes. Chronic infarct of the right paramedian pons. Vascular: No hyperdense vessel. Skull: Calvarium is unremarkable. Sinuses/Orbits: No acute finding. Other: None. Review of the MIP images confirms the above findings CTA NECK Aortic arch: Great vessel origins are patent. Right carotid system: Patent. There is mild tortuosity of the proximal common carotid. No stenosis at the ICA origin. There is stenosis at the ECA origin. Left carotid system: Patent. Mild tortuosity of the  distal common carotid. Minor calcified plaque along the proximal internal carotid without stenosis. Vertebral arteries: Patent.  No stenosis or evidence of dissection. Skeleton: Degenerative changes of the cervical spine. Post ACDF at C4-C7. Other neck: Unremarkable. Upper chest: Artifact through the upper right lung. Visualized lungs are clear. Review of the MIP images confirms the above findings CTA HEAD Anterior circulation: Intracranial internal carotid arteries are patent with mild calcified plaque. Anterior and middle cerebral arteries are patent Posterior circulation: Intracranial vertebral arteries, basilar artery, and posterior cerebral arteries are patent. Left posterior communicating artery is present. Venous sinuses: Patent as allowed by contrast bolus timing. Review of the MIP images confirms the above findings IMPRESSION: No acute intracranial abnormality. No large vessel occlusion, hemodynamically significant stenosis or evidence of dissection. Electronically Signed   By: Macy Mis M.D.   On: 03/22/2021 14:26   CT Angio Neck W and/or Wo Contrast  Result Date: 03/22/2021 CLINICAL DATA:  Dizziness EXAM: CT ANGIOGRAPHY HEAD AND NECK TECHNIQUE: Multidetector CT imaging of the head and neck was performed using the standard protocol during bolus administration of intravenous contrast. Multiplanar CT image reconstructions and MIPs were obtained to evaluate the vascular anatomy. Carotid stenosis measurements (when applicable) are obtained utilizing NASCET criteria, using the distal internal carotid diameter as the denominator. CONTRAST:  89mL OMNIPAQUE IOHEXOL 350 MG/ML SOLN COMPARISON:  Correlation made with MRA 03/15/2020 FINDINGS: CT HEAD Brain: There is no acute intracranial hemorrhage, mass effect, or edema. Gray-white differentiation is preserved. There is no extra-axial fluid collection. No hydrocephalus. Patchy low-attenuation in the supratentorial white matter is nonspecific probably  reflects mild chronic microvascular ischemic changes. Chronic infarct of the right paramedian pons. Vascular: No hyperdense vessel. Skull: Calvarium is unremarkable. Sinuses/Orbits: No acute finding. Other: None. Review of the MIP images confirms the above findings CTA NECK Aortic arch: Great vessel origins are patent. Right carotid system: Patent. There is mild tortuosity of the proximal common carotid. No stenosis at the ICA origin. There is stenosis at the ECA origin. Left carotid system: Patent. Mild tortuosity of the distal common carotid. Minor calcified plaque along the proximal internal carotid without stenosis. Vertebral arteries: Patent.  No stenosis or evidence of dissection. Skeleton: Degenerative changes of the cervical spine. Post ACDF at C4-C7. Other neck: Unremarkable. Upper chest: Artifact through the upper right lung. Visualized lungs are clear. Review of the MIP images confirms the above findings CTA HEAD Anterior circulation: Intracranial internal carotid arteries are patent with mild calcified plaque. Anterior and middle cerebral arteries are patent Posterior circulation: Intracranial vertebral arteries, basilar artery, and posterior cerebral  arteries are patent. Left posterior communicating artery is present. Venous sinuses: Patent as allowed by contrast bolus timing. Review of the MIP images confirms the above findings IMPRESSION: No acute intracranial abnormality. No large vessel occlusion, hemodynamically significant stenosis or evidence of dissection. Electronically Signed   By: Macy Mis M.D.   On: 03/22/2021 14:26    Procedures Procedures   Medications Ordered in ED Medications  meclizine (ANTIVERT) tablet 25 mg (25 mg Oral Given 03/22/21 1149)  iohexol (OMNIPAQUE) 350 MG/ML injection 75 mL (75 mLs Intravenous Contrast Given 03/22/21 1318)  hydrALAZINE (APRESOLINE) tablet 25 mg (25 mg Oral Given 03/22/21 1518)    ED Course  I have reviewed the triage vital signs and the  nursing notes.  Pertinent labs & imaging results that were available during my care of the patient were reviewed by me and considered in my medical decision making (see chart for details).    MDM Rules/Calculators/A&P                          75yo female with history of asthma, hypertension, hyperlipidemia, hypoerglycemia, CKD stage 3, former tobacco use, moderate CAD, moderate AR, history of CVA 02/2020, OSA, who had BP medications discontinued due to orthostatic changes with olmesartan reinitiated at lower dose 2 weeks ago presents with concern for dizziness and neck pain.  Given neck pain with dizziness, CTA head and neck completed which does not show signs of carotid or vertebral dissection or thrombosis.  EKG without significant findings.  Troponins negative, doubt ACS. No significant electrolyte abnormalities or anemia.  Differential diagnosis for dizziness includes central causes and peripheral causes such as BPPV, meniere's disease, viral.  Vertigo is positional, patient has normal neurologic exam, including normal gait and coordination.  By history and exam feel peripheral vertigo is most likely, however given history of CVA, discussed possibility of transfer for MRI for evaluation. After discussion, agree given higher suspicion for peripheral vertigo, normal exam and improvement of symptoms, feel that discharge with PCP follow up is appropriate. BP elevated in ED, given dose of hydralazine prior to discharge, will increase olmesartan to 40mg , start meclizine/zofran, try epley's manuevers and recommend strict return precautions. Patient discharged in stable condition with understanding of reasons to return.   Final Clinical Impression(s) / ED Diagnoses Final diagnoses:  Dizziness  Vertigo    Rx / DC Orders ED Discharge Orders          Ordered    olmesartan (BENICAR) 20 MG tablet  Daily        03/22/21 1458    ondansetron (ZOFRAN) 4 MG tablet  Every 6 hours        03/22/21 1458     meclizine (ANTIVERT) 25 MG tablet  3 times daily PRN        03/22/21 1458             Gareth Morgan, MD 03/22/21 2347

## 2021-04-05 ENCOUNTER — Other Ambulatory Visit: Payer: Self-pay

## 2021-04-05 DIAGNOSIS — I2510 Atherosclerotic heart disease of native coronary artery without angina pectoris: Secondary | ICD-10-CM

## 2021-04-05 DIAGNOSIS — Z789 Other specified health status: Secondary | ICD-10-CM

## 2021-04-05 DIAGNOSIS — E7800 Pure hypercholesterolemia, unspecified: Secondary | ICD-10-CM

## 2021-04-05 MED ORDER — PRALUENT 150 MG/ML ~~LOC~~ SOAJ: 1.0000 | SUBCUTANEOUS | 3 refills | Status: DC

## 2021-04-09 ENCOUNTER — Encounter: Payer: Self-pay | Admitting: Neurology

## 2021-04-09 ENCOUNTER — Telehealth: Payer: Self-pay | Admitting: Neurology

## 2021-04-09 DIAGNOSIS — G4733 Obstructive sleep apnea (adult) (pediatric): Secondary | ICD-10-CM

## 2021-04-09 NOTE — Telephone Encounter (Signed)
Received the ONO report that the patient had completed while wearing her auto CPAP. Will have Dr Dohmeier review and advise but appears the study was completed for 3 hr and 19 min. Out of that time recorded there was a total of 2 hour 56 min where the patient oxygen was spent below 89%.  We should be able to bring the patient back in for titration to oxygen.   Will see what Dr Brett Fairy states and let the patient know.

## 2021-04-10 NOTE — Telephone Encounter (Signed)
Sent a message to the pt informing of the results and ordered the titration study for oxygen titration

## 2021-04-16 ENCOUNTER — Telehealth: Payer: Self-pay

## 2021-04-16 ENCOUNTER — Encounter: Payer: Self-pay | Admitting: Physician Assistant

## 2021-04-16 ENCOUNTER — Ambulatory Visit (INDEPENDENT_AMBULATORY_CARE_PROVIDER_SITE_OTHER): Payer: Medicare Other | Admitting: Physician Assistant

## 2021-04-16 VITALS — BP 144/80 | HR 88

## 2021-04-16 DIAGNOSIS — K219 Gastro-esophageal reflux disease without esophagitis: Secondary | ICD-10-CM | POA: Diagnosis not present

## 2021-04-16 DIAGNOSIS — R1013 Epigastric pain: Secondary | ICD-10-CM

## 2021-04-16 DIAGNOSIS — R1310 Dysphagia, unspecified: Secondary | ICD-10-CM

## 2021-04-16 NOTE — Patient Instructions (Addendum)
It was my pleasure to provide care to you today. Based on our discussion, I am providing you with my recommendations below:  RECOMMENDATION(S):   Please take Pantoprazole once daily  IMAGING:  You will be contacted by [facility] Scheduling (Your caller ID will indicate phone # [phone]) within the next business 7-10 business days to schedule your Barium Swallow with tablet. If you have not heard from them within 7-10 business days, please call [facility] at [phone] to follow up on the status of your appointment.    FOLLOW UP:  After your procedure, you will receive a call from my office staff regarding my recommendation for follow up.  BMI:  If you are age 73 or older, your body mass index should be between 23-30. Your There is no height or weight on file to calculate BMI. If this is out of the aforementioned range listed, please consider follow up with your Primary Care Provider.  MY CHART:  The [facility] providers would like to encourage you to use [facility] to communicate with providers for non-urgent requests or questions.  Due to long hold times on the telephone, sending your provider a message by [facility] may be a faster and more efficient way to get a response.  Please allow 48 business hours for a response.  Please remember that this is for non-urgent requests.   Thank you for trusting me with your gastrointestinal care!    Ellouise Newer, [place]

## 2021-04-16 NOTE — Progress Notes (Signed)
Chief Complaint: Epigastric pain, bloating and reflux with dysphagia  HPI:    Shelley Spencer is a 75 year old female with past medical history of CAD on Plavix (03/15/2020 echo with LVEF 65-70%), stroke in June 2021, GERD and multiple others listed below, known to Dr. Silverio Decamp, who was referred to me by Crist Infante, MD for a complaint of epigastric pain, bloating, reflux and dysphagia.      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    10/16/2016 office visit with Dr. Silverio Decamp for follow-up of reflux.  At that time was discussed she could taper down to Protonix once a day and use Zantac 150 mg at bedtime as needed.    04/30/2017 colonoscopy with 1 5 mm polyp in the transverse colon, diverticulosis and nonbleeding internal hemorrhoids.  Pathology showed tubular adenoma.  Repeat was recommended in 5 years.    02/22/2021 patient saw PCP and discussed some left upper quadrant pain and inability to eat as well as waking up at night several times despite trazodone.  Increase satiety, bloating and reflux.  Also some trouble swallowing/choking on food since her stroke which was mild.  Apparently had started a probiotic and tried fiber but it made her feel worse.  At that time it was suspected that she had a component of edema related to decreased diuresis, weight and increased reflux.  Her PPI was increased from 40 mg once daily to twice daily and she was referred to [place] for possible EGD.    02/22/2021 CMP and CBC normal.    03/22/2021 CT angio of the head and neck with no acute intracranial abnormality, no large vessel occlusion.    Today, the patient presents to clinic and tells me that ever and since time of her stroke she feels like she has had issues with choking on food.  Describes that she will be eating or drinking and just starts coughing a lot.  Denies anything actually feeling like it gets stuck in her throat.  Does tell me she also deals with reflux and  is on Pantoprazole 40 mg daily.  Occasionally she has to increase this to twice daily and go on a more of a "BRAT diet", for what she calls gastritis.  She describes this as an epigastric burning discomfort rated as a 4-01/2009 which is typically set off by certain foods including spicy things.  Tells me she recently had a flare of this but is doing better now and back to her once daily Pantoprazole.    Denies fever, chills, weight loss, change in bowel habits, nausea or vomiting.  Past Medical History:  Diagnosis Date   Anemia    Anxiety    Arthritis    Asthma    Bicornuate uterus    double cervix   CAD (coronary artery disease)    Colon polyp    Depression    Deviated septum    DJD (degenerative joint disease)    L-Spine   Fibroids    GERD (gastroesophageal reflux disease)    Hematuria    Hiatal hernia    HNP (herniated nucleus pulposus)    lower back   Hyperlipidemia    Hypertension    MVP (mitral valve prolapse)    pt denies, pt states she had a Echo ?2008 and it did not show MVP   Pre-diabetes    Retinal tear 2005   Small right retinal tear   Shingles 1980's   on  her waist    Past Surgical History:  Procedure Laterality Date   ANTERIOR CERVICAL DECOMP/DISCECTOMY FUSION N/A 09/05/2017   Procedure: Cervical four-five, Cervical five-six, Cervical six-seven Anterior cervical decompression/discectomy/fusion;  Surgeon: Erline Levine, MD;  Location: [place];  Service: Neurosurgery;  Laterality: N/A;   bone spur surgery Bilateral    x 3   CARDIOVASCULAR STRESS TEST  02/08/2014   COLONOSCOPY     CYST EXCISION Left 09/29/2018   Procedure: LEFT MIDDLE FINGER CYST REMOVAL AND DEBRIDEMENT OF DISTAL INTERPHALANGEAL JOINT;  Surgeon: Daryll Brod, MD;  Location: [place];  Service: Orthopedics;  Laterality: Left;   FINGER SURGERY Left    left third finger mucoid cyst removed   FOOT NEUROMA SURGERY Bilateral    HYSTEROSCOPY     D&C PMB Endo Cx Polyp   KNEE ARTHROSCOPY  WITH MENISCAL REPAIR Right 2016   --Flossie Dibble, MD   LAPAROSCOPIC HYSTERECTOMY  7/209/10   R-TLH/BSO  Fibroids, BAck Pain, Adenomyosis   LUMBAR FUSION     removal vaginal septum     TOTAL KNEE ARTHROPLASTY Right 05/17/2019   Procedure: TOTAL KNEE ARTHROPLASTY;  Surgeon: Gaynelle Arabian, MD;  Location: [facility];  Service: Orthopedics;  Laterality: Right;  54mn   TUBAL LIGATION      Current Outpatient Medications  Medication Sig Dispense Refill   acetaminophen (TYLENOL) 650 MG CR tablet Take 1,300 mg by mouth every 8 (eight) hours as needed for pain.      albuterol (VENTOLIN HFA) 108 (90 Base) MCG/ACT inhaler Inhale 1-2 puffs into the lungs every 6 (six) hours as needed for wheezing or shortness of breath.      Alirocumab (PRALUENT) 150 MG/ML SOAJ Inject 1 Syringe into the skin every 14 (fourteen) days. 2 mL 3   ALPRAZolam (XANAX) 0.25 MG tablet Take 0.25 mg by mouth 3 (three) times daily as needed.     budesonide-formoterol (SYMBICORT) 160-4.5 MCG/ACT inhaler Inhale 2 puffs into the lungs daily.     buPROPion (WELLBUTRIN XL) 150 MG 24 hr tablet Take 150 mg by mouth daily.     CALCIUM-VITAMIN D PO Take 1 tablet by mouth daily.     cetirizine (ZYRTEC) 10 MG tablet Take 10 mg by mouth daily.     clopidogrel (PLAVIX) 75 MG tablet Take 1 tablet (75 mg total) by mouth daily. 90 tablet 3   furosemide (LASIX) 40 MG tablet Take 20 mg by mouth daily.     gabapentin (NEURONTIN) 300 MG capsule Take 1 capsule by mouth daily.     isosorbide mononitrate (IMDUR) 30 MG 24 hr tablet Take 1 tablet (30 mg total) by mouth daily. 90 tablet 3   magnesium gluconate (MAGONATE) 500 MG tablet Take 500 mg by mouth daily.     meclizine (ANTIVERT) 25 MG tablet Take 1 tablet (25 mg total) by mouth 3 (three) times daily as needed for dizziness. 30 tablet 0   Multiple Vitamins-Minerals (MULTIVITAMIN PO) Take 1 tablet by mouth daily.     nitroGLYCERIN (NITROSTAT) 0.4 MG SL tablet Place 1 tablet (0.4 mg total) under the  tongue every 5 (five) minutes as needed for chest pain. 30 tablet 2   NON FORMULARY CPAP     olmesartan (BENICAR) 20 MG tablet Take 2 tablets (40 mg total) by mouth daily. 30 tablet 3   ondansetron (ZOFRAN) 4 MG tablet Take 1 tablet (4 mg total) by mouth every 6 (six) hours. 12 tablet 0   pantoprazole (PROTONIX) 40 MG tablet Take 1  tablet (40 mg total) by mouth 2 (two) times daily. (Patient taking differently: Take 40 mg by mouth every evening.) 180 tablet 3   PARoxetine (PAXIL) 40 MG tablet Take 40 mg by mouth every evening.      potassium chloride (KLOR-CON) 10 MEQ tablet Take 10 mEq by mouth daily.     traZODone (DESYREL) 150 MG tablet Take 150 mg by mouth at bedtime.     vitamin B-12 (CYANOCOBALAMIN) 1000 MCG tablet Take 1,000 mcg by mouth daily.     No current facility-administered medications for this visit.    Allergies as of 04/16/2021 - Review Complete 03/22/2021  Allergen Reaction Noted   Amlodipine Other (See Comments) and Swelling 02/09/2014   Fenofibrate Other (See Comments) 02/09/2014   Omeprazole Other (See Comments) 02/09/2014   Simvastatin Other (See Comments) 02/09/2014   Singulair [montelukast sodium] Other (See Comments) 02/09/2014   Statins Other (See Comments) 01/12/2014    Family History  Problem Relation Age of Onset   Heart failure Father        Possibly related to EtOH   Alcohol abuse Father    Emphysema Father    Emphysema Mother    Heart failure Mother        Possibly related to EtOH   Diabetes Mother    Alcohol abuse Mother    Valvular heart disease Brother    Bipolar disorder Brother    Crohn's disease Daughter    Thyroid disease Daughter    Colon cancer Neg Hx    Stomach cancer Neg Hx    Esophageal cancer Neg Hx    Rectal cancer Neg Hx    Liver cancer Neg Hx    Stroke Neg Hx     Social History   Socioeconomic History   Marital status: Married    Spouse name: Not on file   Number of children: 2   Years of education: Not on file    Highest education level: Not on file  Occupational History   Occupation: Editor, commissioning  Tobacco Use   Smoking status: Former    Packs/day: 1.00    Years: 20.00    Pack years: 20.00    Types: Cigarettes    Quit date: 09/24/1979    Years since quitting: 41.5   Smokeless tobacco: Never  Vaping Use   Vaping Use: Never used  Substance and Sexual Activity   Alcohol use: Not Currently    Alcohol/week: 1.0 standard drink    Types: 1 Standard drinks or equivalent per week   Drug use: No   Sexual activity: Yes    Partners: Male    Birth control/protection: Surgical    Comment: R-TLH/BSO  Other Topics Concern   Not on file  Social History Narrative   Husband with heart transplant.  Lives with husband and son.   Social Determinants of Radio broadcast assistant Strain: Not on file  Food Insecurity: Not on file  Transportation Needs: Not on file  Physical Activity: Not on file  Stress: Not on file  Social Connections: Not on file  Intimate Partner Violence: Not on file    Review of Systems:    Constitutional: No weight loss, fever or chills Skin: No rash  Cardiovascular: No chest pain Respiratory: No SOB  Gastrointestinal: See HPI and otherwise negative Genitourinary: No dysuria  Neurological: No headache, dizziness or syncope Musculoskeletal: No new muscle or joint pain Hematologic: No bleeding  Psychiatric: No history of depression or anxiety   Physical Exam:  Vital signs: BP (!) 144/80   Pulse 88   LMP  (LMP Unknown)   SpO2 96%    Constitutional:   Pleasant Elderly Caucasian female appears to be in NAD, Well developed, Well nourished, alert and cooperative Head:  Normocephalic and atraumatic. Eyes:   PEERL, EOMI. No icterus. Conjunctiva pink. Ears:  Normal auditory acuity. Neck:  Supple Throat: Oral cavity and pharynx without inflammation, swelling or lesion.  Respiratory: Respirations even and unlabored. Lungs clear to auscultation bilaterally.   No wheezes,  crackles, or rhonchi.  Cardiovascular: Normal S1, S2. No MRG. Regular rate and rhythm. No peripheral edema, cyanosis or pallor.  Gastrointestinal:  Soft, nondistended, nontender. No rebound or guarding. Normal bowel sounds. No appreciable masses or hepatomegaly. Rectal:  Not performed.  Msk:  Symmetrical without gross deformities. Without edema, no deformity or joint abnormality.  Neurologic:  Alert and  oriented x4;  grossly normal neurologically.  Skin:   Dry and intact without significant lesions or rashes. Psychiatric: Demonstrates good judgement and reason without abnormal affect or behaviors.  RELEVANT LABS AND IMAGING: CBC    Component Value Date/Time   WBC 5.8 03/22/2021 1115   RBC 5.58 (H) 03/22/2021 1115   HGB 14.6 03/22/2021 1115   HCT 46.8 (H) 03/22/2021 1115   PLT 284 03/22/2021 1115   MCV 83.9 03/22/2021 1115   MCH 26.2 03/22/2021 1115   MCHC 31.2 03/22/2021 1115   RDW 14.3 03/22/2021 1115   LYMPHSABS 1.7 03/22/2021 1115   MONOABS 0.4 03/22/2021 1115   EOSABS 0.2 03/22/2021 1115   BASOSABS 0.1 03/22/2021 1115    CMP     Component Value Date/Time   NA 143 03/22/2021 1115   NA 145 (H) 03/19/2019 1159   K 4.0 03/22/2021 1115   CL 105 03/22/2021 1115   CO2 29 03/22/2021 1115   GLUCOSE 100 (H) 03/22/2021 1115   BUN 16 03/22/2021 1115   BUN 20 03/19/2019 1159   CREATININE 1.12 (H) 03/22/2021 1115   CALCIUM 10.0 03/22/2021 1115   PROT 7.2 03/22/2021 1115   ALBUMIN 4.2 03/22/2021 1115   AST 17 03/22/2021 1115   ALT 17 03/22/2021 1115   ALKPHOS 103 03/22/2021 1115   BILITOT 0.4 03/22/2021 1115   GFRNONAA 52 (L) 03/22/2021 1115   GFRAA 40 (L) 03/14/2020 1413    Assessment: 1.  Dysphagia: Some trouble with "choking", though denies that food or water actually gets stuck in her throat, more so will bring on a "coughing spell", this is been occurring since time of her stroke; consider dysmotility versus stricture versus esophagitis 2.  GERD: Chronic for the  patient typically controlled on Pantoprazole 40 mg daily, last EGD in 2018 with LA grade A esophagitis 3.  Epigastric pain: Occasional "flares" of epigastric discomfort related to spicy foods, resolves after twice daily Pantoprazole 40 for period of time and a bland diet; likely gastritis  Plan: 1.  Ordered a barium esophagram with tablet for further evaluation of dysphagia.  This has been occurring ever since time of her stroke, I wonder if it is truly a dysmotility issue versus structural abnormality. 2.  Continue Pantoprazole 40 mg daily, discussed that she can increase this to twice daily when needed for epigastric pain. 3.  Reviewed antidysphagia measures including taking small bites, chewing well, avoiding distraction while eating and the chin tuck technique. 4.  Patient to follow in clinic per recommendations after barium esophagram above.  Ellouise Newer, PA-C [facility] 04/16/2021, 11:33 AM  Cc: Crist Infante,  MD

## 2021-04-16 NOTE — Telephone Encounter (Signed)
error 

## 2021-04-16 NOTE — Telephone Encounter (Signed)
Called pt back. Advised she should keep appt on 04/30/21. This is her initial cpap f/u. She got set up 02/27/21. (Website:[email or web address]). Reminded her to bring machine to appt with her. She verbalized understanding.

## 2021-04-16 NOTE — Progress Notes (Signed)
Following message sent to Rhys Martini and April Pait:  [facility] Phone: [phone] Fax: [phone]   Imaging Ordered: Barium Swallow w/ tablet  Diagnosis: Dysphagia, Epigastric pain and GERD  Ordering Provider: Ellouise Newer, PA  Is a Prior Authorization needed? We are in the process of obtaining it now  Is the patient Diabetic? No  Does the patient have Hypertension? Yes  Does the patient have any implanted devices or hardware? No  Date of last BUN/Creat, if needed? N/A  Patient Weight? 185#  Is the patient able to get on the table? Yes  Has the patient been diagnosed with COVID? No  Is the patient waiting on COVID testing results? No  Thank you for your assistance! [facility]

## 2021-04-16 NOTE — Telephone Encounter (Signed)
Pt called, test not done until 06/14/21. Want to know if should keep appt with Dr. Brett Fairy on 04/30/21. Would like a call from the nurse.

## 2021-04-18 MED ORDER — PANTOPRAZOLE SODIUM 40 MG PO TBEC: 40.0000 mg | DELAYED_RELEASE_TABLET | Freq: Every day | ORAL | 3 refills | Status: AC

## 2021-04-18 NOTE — Progress Notes (Signed)
Appointment Information  Name: Raedene, Aragones MRN: [id number]  Date: 04/26/2021 Status: Sch  Time: 10:30 AM Length: 30  Visit Type: DG ES SCOUT CH DEL IMG 2X CM [id number] Copay: $0.00  Provider: [facility] Department: [facility] RAD  Referring Provider: Levin Erp CSN: [id number]  Notes: pt arrive at 1015 @ [facility] 3hrs  Made On: 04/17/2021 10:49 AM By: Jimmie Molly

## 2021-04-18 NOTE — Addendum Note (Signed)
Addended by: Hardie Pulley, Sherylann Vangorden J on: 04/18/2021 03:16 PM   Modules accepted: Orders

## 2021-04-26 ENCOUNTER — Ambulatory Visit (HOSPITAL_COMMUNITY)
Admission: RE | Admit: 2021-04-26 | Discharge: 2021-04-26 | Disposition: A | Discharge status: Home / Self Care | Payer: Medicare Other | Source: Ambulatory Visit | Attending: Physician Assistant | Admitting: Physician Assistant

## 2021-04-26 ENCOUNTER — Other Ambulatory Visit: Payer: Self-pay

## 2021-04-26 DIAGNOSIS — R1013 Epigastric pain: Secondary | ICD-10-CM | POA: Diagnosis present

## 2021-04-26 DIAGNOSIS — R1310 Dysphagia, unspecified: Secondary | ICD-10-CM | POA: Diagnosis not present

## 2021-04-26 DIAGNOSIS — K219 Gastro-esophageal reflux disease without esophagitis: Secondary | ICD-10-CM | POA: Diagnosis present

## 2021-04-30 ENCOUNTER — Ambulatory Visit: Payer: Self-pay | Admitting: Neurology

## 2021-05-01 ENCOUNTER — Ambulatory Visit: Payer: Medicare Other | Admitting: Family Medicine

## 2021-05-02 ENCOUNTER — Ambulatory Visit: Payer: Medicare Other | Admitting: Family Medicine

## 2021-05-16 NOTE — Progress Notes (Signed)
PATIENT: Shelley Spencer DOB: 07-04-1946  REASON FOR VISIT: follow up HISTORY FROM: patient  Chief Complaint  Patient presents with   Obstructive Sleep Apnea    RM 1 Alone Pt is well, doesn't like using machine but it does help her sleep at night and stay awake during the day. She loves the results.       HISTORY OF PRESENT ILLNESS: 05/17/21 ALL:  Shelley Spencer is a 75 y.o. female here today for follow up for OSA on CPAP.  PSG 10/2020 showed mild OSA with AHI 10.3/hr, exacerbated in REM to 30/hr and hypoxemia was noted. Titration study showed reponse at [place] and 2L/O2. AutoPAP was ordered at 5-13cmH20with small Simplus FFM. She has not been able to get oxygen covered by insurance but has ONO scheduled this month. She does feel much better rested. She is sleeping better. She had to go to a local [employer] to get new masks, had trouble with customer service line.   Compliance report dated 04/17/2021-05/16/2021 shows she used CPAP 30/30 nights and 27/30 nights for greater than 4 hours. Average usage was 7 hours. Residual AHI was 4.8 on 5-13cmH20. No leak.   HISTORY: (copied from Dr Dohmeier's previous note)  Shelley Spencer is a 75 y.o. year old White or Caucasian female patient seen here as a referral on 10/18/2020 from Dr Virgina Jock. Chief concern according to patient : see above :   Mrs. Shelley Spencer presents today upon referral by her cardiology practice, her primary care physician is Dr. Haynes Kerns but in June 2021 she had developed a gait disturbance involving her left lower extremity and was diagnosed with a right pontine stroke she recuperated well from this an MRI had showed moderate chronic small vessel disease.  She had to stop cholesterol medication due to muscle aches.  In November she presented to the [facility] emergency department for complaints of chest pain troponins were negative EKG without changes she was evaluated by Dr. Dr. Lemmie Evens at the time started on Imdur for what was  considered chronic stable angina.  She had no recurrence of the chest pain.  But they have been reports that the patient had dropping oxygen levels while she was sleeping and observed.    Shelley Spencer is a right -handed Caucasian female with a possible sleep disorder. She  has a past medical history of Anemia, Anxiety, Arthritis, Asthma, Bicornuate uterus, CAD (coronary artery disease), Colon polyp, clinical Depression, Deviated nasal septum, DJD (degenerative joint disease), Fibroids, GERD (gastroesophageal reflux disease), Hematuria, Hiatal hernia, HNP (herniated nucleus pulposus), Hyperlipidemia, Hypertension, MVP (mitral valve prolapse), Pre-diabetes, Retinal tear (2005), and Shingles (1980's)..   Family medical /sleep history:no other family member on CPAP with OSA, both parents had COPD.    Social history:  Patient is working as as a Pharmacist, hospital, she married a much older man and he has chronic health condition, culminating in a heart transplant.   she lives in a household with spouse, her children are grown. A 80 year old son lives with them and a 73 year-old grandchild, The boy overdosed on Fentanyl while in mother's hoe, now son has custody. Son is a recovering alcoholic.   The patient currently works full time in special ed. Pets are present. A dog and a cat.  Tobacco use see above .The patient has a remote history of smoking. Age [age], ETOH use - seldom,  Caffeine intake in form of Coffee( 1 cup a day) Soda( /) Tea (iced  tea - 3 glasses a day. )  Regular exercise - not much.      Sleep habits are as follows: The patient's dinner time is between 6-7 PM. The patient goes to bed at 10-11 PM, takes trazodone. Sleeps in a cool, quiet and dark bedroom- husband is deaf.  She continues to sleep for intervals of 1-2 hours when not on Trazodone, wakes for 1 bathroom break.  The preferred sleep position is on her side, with the support of 1-2 pillows.  Dreams are reportedly infrequent but /vivid.  7   AM is the usual rise time.  The patient wakes up with an alarm. Likes to snooze the alarm.  She reports not feeling refreshed or restored in AM, with symptoms such as dry mouth , but no morning headaches, and residual fatigue. By lunchtime she gets sleepy again.  Naps are taken infrequently.   REVIEW OF SYSTEMS: Out of a complete 14 system review of symptoms, the patient complains only of the following symptoms, fatigue and all other reviewed systems are negative.  ESS: 3  ALLERGIES: Allergies  Allergen Reactions   Amlodipine Other (See Comments) and Swelling    Legs swell some and she doesn't like it.   Fenofibrate Other (See Comments)    Leg pain   Omeprazole Other (See Comments)    Unknown   Simvastatin Other (See Comments)    Myalgia, leg pains   Singulair [Montelukast Sodium] Other (See Comments)    Affected mood   Statins Other (See Comments)    Myalgia, leg pains    HOME MEDICATIONS: Outpatient Medications Prior to Visit  Medication Sig Dispense Refill   acetaminophen (TYLENOL) 650 MG CR tablet Take 1,300 mg by mouth every 8 (eight) hours as needed for pain.      albuterol (VENTOLIN HFA) 108 (90 Base) MCG/ACT inhaler Inhale 1-2 puffs into the lungs every 6 (six) hours as needed for wheezing or shortness of breath.      Alirocumab (PRALUENT) 150 MG/ML SOAJ Inject 1 Syringe into the skin every 14 (fourteen) days. 2 mL 3   ALPRAZolam (XANAX) 0.25 MG tablet Take 0.25 mg by mouth 3 (three) times daily as needed.     budesonide-formoterol (SYMBICORT) 160-4.5 MCG/ACT inhaler Inhale 2 puffs into the lungs daily.     buPROPion (WELLBUTRIN XL) 150 MG 24 hr tablet Take 150 mg by mouth daily.     CALCIUM-VITAMIN D PO Take 1 tablet by mouth daily.     cetirizine (ZYRTEC) 10 MG tablet Take 10 mg by mouth daily.     clopidogrel (PLAVIX) 75 MG tablet Take 1 tablet (75 mg total) by mouth daily. 90 tablet 3   furosemide (LASIX) 40 MG tablet Take 20 mg by mouth daily.     gabapentin  (NEURONTIN) 300 MG capsule Take 1 capsule by mouth daily.     isosorbide mononitrate (IMDUR) 30 MG 24 hr tablet Take 1 tablet (30 mg total) by mouth daily. 90 tablet 3   magnesium gluconate (MAGONATE) 500 MG tablet Take 500 mg by mouth daily.     meclizine (ANTIVERT) 25 MG tablet Take 1 tablet (25 mg total) by mouth 3 (three) times daily as needed for dizziness. 30 tablet 0   Multiple Vitamins-Minerals (MULTIVITAMIN PO) Take 1 tablet by mouth daily.     nitroGLYCERIN (NITROSTAT) 0.4 MG SL tablet Place 1 tablet (0.4 mg total) under the tongue every 5 (five) minutes as needed for chest pain. 30 tablet 2   NON FORMULARY  CPAP     olmesartan (BENICAR) 20 MG tablet Take 2 tablets (40 mg total) by mouth daily. 30 tablet 3   ondansetron (ZOFRAN) 4 MG tablet Take 1 tablet (4 mg total) by mouth every 6 (six) hours. 12 tablet 0   pantoprazole (PROTONIX) 40 MG tablet Take 1 tablet (40 mg total) by mouth daily. (Patient taking differently: Take 40 mg by mouth 2 (two) times daily.) 90 tablet 3   PARoxetine (PAXIL) 40 MG tablet Take 40 mg by mouth every evening.      potassium chloride (KLOR-CON) 10 MEQ tablet Take 10 mEq by mouth daily.     traZODone (DESYREL) 150 MG tablet Take 150 mg by mouth at bedtime.     vitamin B-12 (CYANOCOBALAMIN) 1000 MCG tablet Take 1,000 mcg by mouth daily.     No facility-administered medications prior to visit.    PAST MEDICAL HISTORY: Past Medical History:  Diagnosis Date   Anemia    Anxiety    Arthritis    Asthma    Bicornuate uterus    double cervix   CAD (coronary artery disease)    Colon polyp    Depression    Deviated septum    DJD (degenerative joint disease)    L-Spine   Fibroids    GERD (gastroesophageal reflux disease)    Hematuria    Hiatal hernia    HNP (herniated nucleus pulposus)    lower back   Hyperlipidemia    Hypertension    MVP (mitral valve prolapse)    pt denies, pt states she had a Echo ?2008 and it did not show MVP   Pre-diabetes     Retinal tear 2005   Small right retinal tear   Shingles 1980's   on her waist   Stroke (cerebrum) ([place]) 02/2020    PAST SURGICAL HISTORY: Past Surgical History:  Procedure Laterality Date   ANTERIOR CERVICAL DECOMP/DISCECTOMY FUSION N/A 09/05/2017   Procedure: Cervical four-five, Cervical five-six, Cervical six-seven Anterior cervical decompression/discectomy/fusion;  Surgeon: Erline Levine, MD;  Location: [facility];  Service: Neurosurgery;  Laterality: N/A;   bone spur surgery Bilateral    x 3   CARDIOVASCULAR STRESS TEST  02/08/2014   COLONOSCOPY     CYST EXCISION Left 09/29/2018   Procedure: LEFT MIDDLE FINGER CYST REMOVAL AND DEBRIDEMENT OF DISTAL INTERPHALANGEAL JOINT;  Surgeon: Daryll Brod, MD;  Location: [facility];  Service: Orthopedics;  Laterality: Left;   FINGER SURGERY Left    left third finger mucoid cyst removed   FOOT NEUROMA SURGERY Bilateral    HYSTEROSCOPY     D&C PMB Endo Cx Polyp   KNEE ARTHROSCOPY WITH MENISCAL REPAIR Right 2016   --Flossie Dibble, MD   LAPAROSCOPIC HYSTERECTOMY  7/209/10   R-TLH/BSO  Fibroids, BAck Pain, Adenomyosis   LUMBAR FUSION     removal vaginal septum     TOTAL KNEE ARTHROPLASTY Right 05/17/2019   Procedure: TOTAL KNEE ARTHROPLASTY;  Surgeon: Gaynelle Arabian, MD;  Location: [facility];  Service: Orthopedics;  Laterality: Right;  17mn   TUBAL LIGATION      FAMILY HISTORY: Family History  Problem Relation Age of Onset   Heart failure Father        Possibly related to EtOH   Alcohol abuse Father    Emphysema Father    Emphysema Mother    Heart failure Mother        Possibly related to EtOH   Diabetes Mother    Alcohol abuse Mother  Valvular heart disease Brother    Bipolar disorder Brother    Crohn's disease Daughter    Thyroid disease Daughter    Colon cancer Neg Hx    Stomach cancer Neg Hx    Esophageal cancer Neg Hx    Rectal cancer Neg Hx    Liver cancer Neg Hx    Stroke Neg Hx     SOCIAL HISTORY: Social  History   Socioeconomic History   Marital status: Married    Spouse name: Not on file   Number of children: 2   Years of education: Not on file   Highest education level: Not on file  Occupational History   Occupation: [employer] teacher  Tobacco Use   Smoking status: Former    Packs/day: 1.00    Years: 20.00    Pack years: 20.00    Types: Cigarettes    Quit date: 09/24/1979    Years since quitting: 41.6   Smokeless tobacco: Never  Vaping Use   Vaping Use: Never used  Substance and Sexual Activity   Alcohol use: Not Currently    Alcohol/week: 1.0 standard drink    Types: 1 Standard drinks or equivalent per week   Drug use: No   Sexual activity: Yes    Partners: Male    Birth control/protection: Surgical    Comment: R-TLH/BSO  Other Topics Concern   Not on file  Social History Narrative   Husband with heart transplant.  Lives with husband and son.   Social Determinants of Health   Financial Resource Strain: Not on file  Food Insecurity: Not on file  Transportation Needs: Not on file  Physical Activity: Not on file  Stress: Not on file  Social Connections: Not on file  Intimate Partner Violence: Not on file     PHYSICAL EXAM  Vitals:   05/17/21 0733  BP: (!) 158/92  Pulse: 82  Weight: 186 lb (84.4 kg)  Height: '5\' 2"'$  (1.575 m)   Body mass index is 34.02 kg/m.  Generalized: Well developed, in no acute distress  Cardiology: normal rate and rhythm, no murmur noted Respiratory: clear to auscultation bilaterally  Neurological examination  Mentation: Alert oriented to time, place, history taking. Follows all commands speech and language fluent Cranial nerve II-XII: Pupils were equal round reactive to light. Extraocular movements were full, visual field were full  Motor: The motor testing reveals 5 over 5 strength of all 4 extremities. Good symmetric motor tone is noted throughout.  Gait and station: Gait is normal.    DIAGNOSTIC DATA (LABS, IMAGING, TESTING) - I  reviewed patient records, labs, notes, testing and imaging myself where available.  No flowsheet data found.   Lab Results  Component Value Date   WBC 5.8 03/22/2021   HGB 14.6 03/22/2021   HCT 46.8 (H) 03/22/2021   MCV 83.9 03/22/2021   PLT 284 03/22/2021      Component Value Date/Time   NA 143 03/22/2021 1115   NA 145 (H) 03/19/2019 1159   K 4.0 03/22/2021 1115   CL 105 03/22/2021 1115   CO2 29 03/22/2021 1115   GLUCOSE 100 (H) 03/22/2021 1115   BUN 16 03/22/2021 1115   BUN 20 03/19/2019 1159   CREATININE 1.12 (H) 03/22/2021 1115   CALCIUM 10.0 03/22/2021 1115   PROT 7.2 03/22/2021 1115   ALBUMIN 4.2 03/22/2021 1115   AST 17 03/22/2021 1115   ALT 17 03/22/2021 1115   ALKPHOS 103 03/22/2021 1115   BILITOT 0.4 03/22/2021 1115  GFRNONAA 52 (L) 03/22/2021 1115   GFRAA 40 (L) 03/14/2020 1413   Lab Results  Component Value Date   CHOL 148 09/11/2020   HDL 66 09/11/2020   LDLCALC 63 09/11/2020   TRIG 103 09/11/2020   CHOLHDL 2.5 03/15/2020   Lab Results  Component Value Date   HGBA1C 6.1 (H) 03/15/2020   No results found for: VITAMINB12 Lab Results  Component Value Date   TSH 2.500 03/15/2020     ASSESSMENT AND PLAN 75 y.o. year old female  has a past medical history of Anemia, Anxiety, Arthritis, Asthma, Bicornuate uterus, CAD (coronary artery disease), Colon polyp, Depression, Deviated septum, DJD (degenerative joint disease), Fibroids, GERD (gastroesophageal reflux disease), Hematuria, Hiatal hernia, HNP (herniated nucleus pulposus), Hyperlipidemia, Hypertension, MVP (mitral valve prolapse), Pre-diabetes, Retinal tear (2005), Shingles (1980's), and Stroke (cerebrum) ([place]) (02/2020). here with     ICD-10-CM   1. OSA on CPAP  G47.33    Z99.89     2. Right pontine stroke (HCC)  I63.50     3. Hypoxemia associated with sleep  G47.36         Shelley Spencer is doing well on CPAP therapy. Compliance report reveals excellent compliance. She was encouraged to  continue using CPAP nightly and for greater than 4 hours each night. We will order oxygen pending ONO results on 06/14/2021. She will continue to monitor BP at home. 131/80 standing in the office. 158/92 sitting. Stroke prevention advised. Risks of untreated sleep apnea review and education materials provided. Healthy lifestyle habits encouraged. She will follow up in  year, sooner if needed. he verbalizes understanding and agreement with this plan.    No orders of the defined types were placed in this encounter.    No orders of the defined types were placed in this encounter.     Debbora Presto, FNP-C 05/17/2021, 7:58 AM [facility] [address] [place], [place] [address] [phone]

## 2021-05-16 NOTE — Patient Instructions (Addendum)
Please continue using your CPAP regularly. While your insurance requires that you use CPAP at least 4 hours each night on 70% of the nights, I recommend, that you not skip any nights and use it throughout the night if you can. Getting used to CPAP and staying with the treatment long term does take time and patience and discipline. Untreated obstructive sleep apnea when it is moderate to severe can have an adverse impact on cardiovascular health and raise her risk for heart disease, arrhythmias, hypertension, congestive heart failure, stroke and diabetes. Untreated obstructive sleep apnea causes sleep disruption, nonrestorative sleep, and sleep deprivation. This can have an impact on your day to day functioning and cause daytime sleepiness and impairment of cognitive function, memory loss, mood disturbance, and problems focussing. Using CPAP regularly can improve these symptoms.  Continue to monitor BP at home. Follow up closely with PCP for stroke prevention. We will work on O2 orders after overnight oximetry is performed.   Follow up in 1 year

## 2021-05-17 ENCOUNTER — Ambulatory Visit (INDEPENDENT_AMBULATORY_CARE_PROVIDER_SITE_OTHER): Payer: Medicare Other | Admitting: Family Medicine

## 2021-05-17 ENCOUNTER — Encounter: Payer: Self-pay | Admitting: Family Medicine

## 2021-05-17 VITALS — BP 158/92 | HR 82 | Ht 62.0 in | Wt 186.0 lb

## 2021-05-17 DIAGNOSIS — G4733 Obstructive sleep apnea (adult) (pediatric): Secondary | ICD-10-CM

## 2021-05-17 DIAGNOSIS — G4736 Sleep related hypoventilation in conditions classified elsewhere: Secondary | ICD-10-CM

## 2021-05-17 DIAGNOSIS — I6350 Cerebral infarction due to unspecified occlusion or stenosis of unspecified cerebral artery: Secondary | ICD-10-CM | POA: Diagnosis not present

## 2021-05-17 DIAGNOSIS — Z9989 Dependence on other enabling machines and devices: Secondary | ICD-10-CM | POA: Diagnosis not present

## 2021-05-21 NOTE — Progress Notes (Signed)
Reviewed and agree with documentation and assessment and plan. K. Veena Shalunda Lindh , MD   

## 2021-05-29 ENCOUNTER — Telehealth: Payer: Self-pay

## 2021-05-29 NOTE — Telephone Encounter (Signed)
Spoke with patient to schedule her follow up appt with Anderson Malta. Pt states that she is doing fine on Protonix BID, pt states that she will see her PCP later this month who will probably give her an updated prescription. Pt had no concerns at the end of the call.

## 2021-05-29 NOTE — Telephone Encounter (Signed)
-----   Message from Yevette Edwards, RN sent at 04/26/2021 12:37 PM EDT ----- Regarding: Follow Up Patient needs a follow up with Anderson Malta in 06/2021 for reflux. Pt requested an appt after 3 PM due to work.

## 2021-06-20 ENCOUNTER — Telehealth: Payer: Self-pay | Admitting: Family Medicine

## 2021-06-20 NOTE — Telephone Encounter (Signed)
Pt called states she had a Carotid Artery test and her doctor is recommending to see [place] again. Pt requesting a call back.

## 2021-06-20 NOTE — Telephone Encounter (Signed)
Called pt back to get further information. She has been a pt of Dr Leonie Man in the past. Saw him in the hospital 02/2020 and was in a research study (pacific stroke study). 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 They were asking her if Dr. Leonie Man planned on repeating this study. Advised this would be a question directed to Dr. Leonie Man. We will reach out to him and call her back with his response.

## 2021-06-21 NOTE — Telephone Encounter (Signed)
Called and LVM for pt relaying Dr. Clydene Fake message. Advised her to call back if she has any further questions/concerns.

## 2021-06-26 ENCOUNTER — Other Ambulatory Visit: Payer: Self-pay | Admitting: Student

## 2021-06-26 DIAGNOSIS — E7800 Pure hypercholesterolemia, unspecified: Secondary | ICD-10-CM

## 2021-06-26 DIAGNOSIS — I2510 Atherosclerotic heart disease of native coronary artery without angina pectoris: Secondary | ICD-10-CM

## 2021-06-26 DIAGNOSIS — Z789 Other specified health status: Secondary | ICD-10-CM

## 2021-07-19 ENCOUNTER — Ambulatory Visit (INDEPENDENT_AMBULATORY_CARE_PROVIDER_SITE_OTHER): Payer: Medicare Other | Admitting: Neurology

## 2021-07-19 ENCOUNTER — Other Ambulatory Visit: Payer: Self-pay

## 2021-07-19 DIAGNOSIS — G4733 Obstructive sleep apnea (adult) (pediatric): Secondary | ICD-10-CM

## 2021-07-19 DIAGNOSIS — G4734 Idiopathic sleep related nonobstructive alveolar hypoventilation: Secondary | ICD-10-CM

## 2021-07-19 DIAGNOSIS — I6350 Cerebral infarction due to unspecified occlusion or stenosis of unspecified cerebral artery: Secondary | ICD-10-CM

## 2021-07-19 DIAGNOSIS — Z9989 Dependence on other enabling machines and devices: Secondary | ICD-10-CM

## 2021-07-24 DIAGNOSIS — G4733 Obstructive sleep apnea (adult) (pediatric): Secondary | ICD-10-CM | POA: Insufficient documentation

## 2021-07-24 DIAGNOSIS — I6350 Cerebral infarction due to unspecified occlusion or stenosis of unspecified cerebral artery: Secondary | ICD-10-CM | POA: Insufficient documentation

## 2021-07-24 DIAGNOSIS — G4734 Idiopathic sleep related nonobstructive alveolar hypoventilation: Secondary | ICD-10-CM | POA: Insufficient documentation

## 2021-07-24 NOTE — Procedures (Signed)
PATIENT'S NAME:  Huntley, Shelley Spencer DOB:      1945/12/07      MR#:    [id number]     DATE OF RECORDING: 07/19/2021 by Jerilynn Mages. Saunders Revel REFERRING M.D.:  Antony Contras, MD, Amy Lomax,NP Cc Dr. Joylene Draft and Drs. Emelia Loron, MD Study Performed:   Titration to CPAP and oxygen  HISTORY: This patient was last seen on 06-14-2021 by AMY LOMAX, NP : Primary Neurologist: Dr Leonie Man, MD ALENCIA GORDON is a 75 y.o. female stroke patient with known OSA on CPAP. She had a right pontine CVA,  PSG from 10/2020 showed mild OSA with AHI 10.3/h, exacerbated in REM to 30/h and hypoxemia was also noted. Titration study showed response at [place] and 2L/O2. Auto-PAP wad been ordered at 5-13cm H20 with a small Simplus FFM as interface.  She has not been able to get oxygen covered by insurance but has ONO scheduled this month. She does feel much better rested and is sleeping longer and deeper, better sleep quality. She had to go to a local [employer] to get new masks, had trouble with customer service line.  Here to see if we can document her need for oxygen while on PAP therapy.   The patient endorsed the Epworth Sleepiness Scale at 12 points.  FSS 33/ 63 points.  The patient's weight 192 pounds with a height of 62 (inches), resulting in a BMI of 35.3 kg/m2. The patient's neck circumference measured 15 inches.  CURRENT MEDICATIONS: Tylenol, Ventolin, Praluent, Aspirin, Symbicort, Wellbutrin, Calcium-vitamin D, Zyrtec, Cardura, Lasix, Imdur, Multivitamin, Nitrostat, Benicar, Protonix, Paxil, K-Dur, Desyrel, Vitamin B12    PROCEDURE:  This is a multichannel digital polysomnogram utilizing the SomnoStar 11.2 system.  Electrodes and sensors were applied and monitored per AASM Specifications.   EEG, EOG, Chin and Limb EMG, were sampled at 200 Hz.  ECG, Snore and Nasal Pressure, Thermal Airflow, Respiratory Effort, CPAP Flow and Pressure, Oximetry was sampled at 50 Hz. Digital video and audio were recorded.       CPAP was  initiated at 8 cmH20 with heated humidity per AASM split night standards and pressure was advanced to 8 cmH20 because of hypopneas, apneas and desaturations.  At a PAP pressure of 8 cmH20, there was a reduction of the AHI to 1.4 with improvement of sleep apnea.  Lights Out was at 21:44 and Lights On at 05:06. Total recording time (TRT) was 442 minutes, with a total sleep time (TST) of 417 minutes. The patient's sleep latency was 14 minutes. REM latency was 345.5 minutes.  The sleep efficiency was 94.3 %.    SLEEP ARCHITECTURE: WASO (Wake after sleep onset) was only 12.5 minutes.  There were 5.5 minutes in Stage N1, 285.5 minutes Stage N2, 87 minutes Stage N3 and 39 minutes in Stage REM.  The percentage of Stage N1 was 1.3%, Stage N2 was 68.5%, Stage N3 was 20.9% and Stage R (REM sleep) was 9.4%. The sleep architecture was notable for high sleep efficiency. The arousals were noted as: 58 were spontaneous, 1 associated with PLMs, 1 associated with respiratory events.     RESPIRATORY ANALYSIS:  There was a total of 12 respiratory events: 4 obstructive apneas, 1 central apneas and 5 mixed apneas with a total of 10 apneas and an apnea index (AI) of 1.4 /hour. There were 2 hypopneas with a hypopnea index of 0.3/hour. The total APNEA/HYPOPNEA INDEX (AHI) was 1.7 /hour. The REM AHI was 9.2 /hour versus a non-REM AHI of 1.0 /hour. The  patient spent 30 minutes of total sleep time in the supine position and 387 minutes in non-supine. The supine AHI was 0.0, versus a non-supine AHI of 1.9.  OXYGEN SATURATION & C02:  The baseline 02 saturation was 93%, with the lowest being 83%. The patient was already hypoxic at the beginning of this sleep study, and 1 liter oxygen had to be added at 22.05 PM.  At 22.30 PM prolonged oxygen desaturation was again noted, during NREM sleep in lateral sleep position.  CPAP remained at 8 cm water. Oxygen was added to 2 liters once the patient turned supine and presented with oxygen  desaturation, nadir of 86 %, for several minutes. Time spent below 89% saturation equaled 35 minutes. The patient was able to maintain oxygen saturation above 88% in REM sleep, non-supine under 2 liters of oxygen.   The patient had a total of 8 Periodic Limb Movements. The Periodic Limb Movement (PLM) Arousal index was 0 .1 /hour. Audio and video analysis did not show any abnormal or unusual movements, behaviors, phonations or vocalizations.  EKG was in keeping with normal rhythm. The patient was fitted with a Small Vitera full face mask.  DIAGNOSIS Fully controlled REM dependent Obstructive Sleep Apnea under 8 cm water CPAP but patient remained still hypoxic.  Additional 2 liters of 02 were needed to maintain the oxygen sat above 89% in all sleep positions and during REM and NREM sleep.   PLANS/RECOMMENDATIONS: we added 2 liters of oxygen to be bled into CPAP device under a pressure of 8 cm water. Please order 2 liters supplemental oxygen .  CPAP therapy compliance is defined as 4 hours or more of nightly use. Any apnea patient should avoid sedatives, hypnotics, and alcohol consumption at bedtime.   DISCUSSION: A follow up appointment will be scheduled with your NP in the Sleep Clinic at [facility].   Please call [phone] with any questions.     I certify that I have reviewed the entire raw data recording prior to the issuance of this report in accordance with the Standards of Accreditation of the American Academy of Sleep Medicine (AASM)   Larey Seat, M.D. Diplomat, Tax adviser of Psychiatry and Neurology  [place], Tax adviser of Sleep Medicine

## 2021-07-24 NOTE — Progress Notes (Signed)
The patient was fitted with a Small Vitera full face mask.  DIAGNOSIS 1. Fully controlled Obstructive Sleep Apnea under 8 cm water CPAP but still hypoxic.  2. Additional 2 liters of 02 were needed to maintain the oxygen sat above 89% in all sleep positions and during REM and NREM sleep.

## 2021-07-24 NOTE — Addendum Note (Signed)
Addended by: Larey Seat on: 07/24/2021 04:21 PM   Modules accepted: Orders

## 2021-07-25 ENCOUNTER — Telehealth: Payer: Self-pay | Admitting: *Deleted

## 2021-07-25 NOTE — Telephone Encounter (Signed)
LVM for pt relaying results per Dr. Edwena Felty message. Advised we will send order to DME to get her set up with O2. Asked her to call back if she has any further questions/concerns.

## 2021-07-25 NOTE — Telephone Encounter (Signed)
-----   Message from Larey Seat, MD sent at 07/24/2021  4:21 PM EDT ----- The patient was fitted with a Small Vitera full face mask.  DIAGNOSIS 1. Fully controlled Obstructive Sleep Apnea under 8 cm water CPAP but still hypoxic.  2. Additional 2 liters of 02 were needed to maintain the oxygen sat above 89% in all sleep positions and during REM and NREM sleep.

## 2021-07-30 ENCOUNTER — Ambulatory Visit: Payer: Medicare Other | Admitting: Neurology

## 2021-08-06 ENCOUNTER — Other Ambulatory Visit: Payer: Medicare Other

## 2021-08-07 ENCOUNTER — Ambulatory Visit: Payer: Medicare Other

## 2021-08-07 ENCOUNTER — Other Ambulatory Visit: Payer: Self-pay

## 2021-08-07 DIAGNOSIS — I6523 Occlusion and stenosis of bilateral carotid arteries: Secondary | ICD-10-CM

## 2021-08-07 DIAGNOSIS — R0989 Other specified symptoms and signs involving the circulatory and respiratory systems: Secondary | ICD-10-CM

## 2021-08-21 NOTE — Telephone Encounter (Signed)
Received the following response from [place] [facility]: "I called her and went over a few things! All is well now. She has my direct number if she ever needs anything! With her tubing when she changed it the plastic piece was still inside of the tubing! We got it out! I did explain to her how to change the tubing without that happening again!"

## 2021-09-04 NOTE — Progress Notes (Signed)
Primary Physician:  Crist Infante, MD   Patient ID: Shelley Spencer, female    DOB: [birth year], 75 y.o.   MRN: [id number]  Subjective:    Chief Complaint  Patient presents with   Coronary Artery Disease   Follow-up    HPI: Shelley Spencer  is a 75 y.o. female  with asthma, hypertension, hyperlipidemia, hyperglycemia, CKD stage 3, former tobacco use with moderate CAD with LAD 50-60% stenosis by coronary CTA on 03/30/2019  (not significant stenosis by FFR), moderate AR by echo in July 2020.  She is a former smoker that quit in 1981. Patient was admitted to the hospital on 03/14/2020 with gait disturbance involving her left lower extremity, had a right pontine stroke.  Diagnosed with OSA CPAP, compliant.  Patient is statin intolerant, and has previously been on both Zetia and Vascepa.  She stopped Zetia due to muscle aches.  Patient stopped Vascepa due to constipation.  Patient presents for 57-monthfollow-up.  Last office visit restarted olmesartan 20 mg daily,Repeat BMP remained stable.  Carotid artery surveillance remained stable with no significant stenosis noted on either side.  We will plan to follow-up as clinically indicated.  Patient is feeling well overall.  She does report a single episode of chest pain a few weeks ago while sitting in church that lasted for approximately 20 minutes and was relieved with nitroglycerin.  She denies associated symptoms.  She remains active taking care of her husband and her grandson.  Patient continues to monitor blood pressure daily at home and reports readings averaging 1384mmHg systolic.  Denies palpitations, dyspnea, syncope, near syncope, orthopnea, PND, leg edema.  Past Medical History:  Diagnosis Date   Anemia    Anxiety    Arthritis    Asthma    Bicornuate uterus    double cervix   CAD (coronary artery disease)    Colon polyp    Depression    Deviated septum    DJD (degenerative joint disease)    L-Spine   Fibroids    GERD  (gastroesophageal reflux disease)    Hematuria    Hiatal hernia    HNP (herniated nucleus pulposus)    lower back   Hyperlipidemia    Hypertension    MVP (mitral valve prolapse)    pt denies, pt states she had a Echo ?2008 and it did not show MVP   Pre-diabetes    Retinal tear 2005   Small right retinal tear   Shingles 1980's   on her waist   St[place] (cerebrum) [place]    Past Surgical History:  Procedure Laterality Date   ANTERIOR CERVICAL DECOMP/DISCECTOMY FUSION N/A 09/05/2017   Procedure: Cervical four-five, Cervical five-six, Cervical six-seven Anterior cervical decompression/discectomy/fusion;  Surgeon: SErline Levine MD;  [place] [facility]  Service: Neurosurgery;  Laterality: N/A;   bone spur surgery Bilateral    x 3   CARDIOVASCULAR STRESS TEST  02/08/2014   COLONOSCOPY     CYST EXCISION Left 09/29/2018   Procedure: LEFT MIDDLE FINGER CYST REMOVAL AND DEBRIDEMENT OF DISTAL INTERPHALANGEAL JOINT;  Surgeon: KDaryll Brod MD;  [place] [facility]  Service: Orthopedics;  Laterality: Left;   FINGER SURGERY Left    left third finger mucoid cyst removed   FOOT NEUROMA SURGERY Bilateral    HYSTEROSCOPY     D&C PMB Endo Cx Polyp   KNEE ARTHROSCOPY WITH MENISCAL REPAIR Right 2016   --DFlossie Dibble MD   LAPAROSCOPIC HYSTERECTOMY  7/209/10   R-TLH/BSO  Fibroids, BAck Pain, Adenomyosis   LUMBAR FUSION     removal vaginal septum     TOTAL KNEE ARTHROPLASTY Right 05/17/2019   Procedure: TOTAL KNEE ARTHROPLASTY;  Surgeon: Gaynelle Arabian, MD;  Location: [facility];  Service: Orthopedics;  Laterality: Right;  55mn   TUBAL LIGATION     Family History  Problem Relation Age of Onset   Heart failure Father        Possibly related to EtOH   Alcohol abuse Father    Emphysema Father    Emphysema Mother    Heart failure Mother        Possibly related to EtOH   Diabetes Mother    Alcohol abuse Mother    Valvular heart disease Brother    Bipolar disorder Brother     Crohn's disease Daughter    Thyroid disease Daughter    Colon cancer Neg Hx    Stomach cancer Neg Hx    Esophageal cancer Neg Hx    Rectal cancer Neg Hx    Liver cancer Neg Hx    Stroke Neg Hx     Social History   Tobacco Use   Smoking status: Former    Packs/day: 1.00    Years: 20.00    Pack years: 20.00    Types: Cigarettes    Quit date: 09/24/1979    Years since quitting: 41.9   Smokeless tobacco: Never  Substance Use Topics   Alcohol use: Not Currently    Alcohol/week: 1.0 standard drink    Types: 1 Standard drinks or equivalent per week    Marital status: Married   ROS   Review of Systems  Constitutional: Negative for malaise/fatigue (resolved) and weight gain.  Cardiovascular:  Positive for chest pain (single episode). Negative for claudication, dyspnea on exertion, leg swelling, near-syncope, orthopnea, palpitations, paroxysmal nocturnal dyspnea and syncope.  Respiratory:  Negative for shortness of breath.   Hematologic/Lymphatic: Does not bruise/bleed easily.  Gastrointestinal:  Negative for melena.  Neurological:  Negative for dizziness and weakness.  Objective:  Blood pressure 139/80, pulse 82, temperature 97.9 F (36.6 C), temperature source Temporal, height _0  (1.575 m), weight 188 lb 12.8 oz (85.6 kg), SpO2 95 %. Body mass index is 34.53 kg/m.   Vitals with BMI 09/05/2021 09/05/2021 05/17/2021  Height - _1  _2   Weight - 188 lbs 13 oz 186 lbs  BMI - 337.34328.76 Systolic [id number] Diastolic 80 85 92  Pulse 82 58 82     Orthostatic VS for the past 72 hrs (Last 3 readings):  Patient Position BP Location Cuff Size  09/05/21 0842 Standing Left Arm --  09/05/21 0834 Sitting Left Arm Normal    Physical Exam Vitals reviewed.  Constitutional:      Appearance: She is well-developed.     Comments: Moderately obese  Cardiovascular:     Rate and Rhythm: Normal rate and regular rhythm.     Pulses: Intact distal pulses.          Carotid pulses are  2+ on the right side with bruit and 2+ on the left side with bruit.      Radial pulses are 2+ on the right side and 2+ on the left side.       Dorsalis pedis pulses are 2+ on the right side and 2+ on the left side.       Posterior tibial pulses are 1+ on the right side and 1+ on the left side.  Heart sounds: S1 normal and S2 normal. Murmur heard.  High-pitched decrescendo early diastolic murmur is present with a grade of 2/4 at the upper right sternal border.    No gallop.     Comments: No JVD. Pulmonary:     Effort: Pulmonary effort is normal. No accessory muscle usage or respiratory distress.     Breath sounds: Normal breath sounds. No wheezing, rhonchi or rales.  Musculoskeletal:     Right lower leg: No edema.     Left lower leg: No edema.   Laboratory examination:    CMP Latest Ref Rng & Units 03/22/2021 08/13/2020 03/15/2020  Glucose 70 - 99 mg/dL 100(H) 91 -  BUN 8 - 23 mg/dL 16 19 -  Creatinine 0.44 - 1.00 mg/dL 1.12(H) 1.35(H) -  Sodium 135 - 145 mmol/L 143 142 -  Potassium 3.5 - 5.1 mmol/L 4.0 3.7 -  Chloride 98 - 111 mmol/L 105 103 -  CO2 22 - 32 mmol/L 29 29 -  Calcium 8.9 - 10.3 mg/dL 10.0 9.3 -  Total Protein 6.5 - 8.1 g/dL 7.2 - 6.6  Total Bilirubin 0.3 - 1.2 mg/dL 0.4 - 0.6  Alkaline Phos 38 - 126 U/L 103 - 90  AST 15 - 41 U/L 17 - 31  ALT 0 - 44 U/L 17 - 18   CBC Latest Ref Rng & Units 03/22/2021 08/13/2020 03/14/2020  WBC 4.0 - 10.5 K/uL 5.8 7.4 7.3  Hemoglobin 12.0 - 15.0 g/dL 14.6 12.4 11.3(L)  Hematocrit 36.0 - 46.0 % 46.8(H) 40.4 37.7  Platelets 150 - 400 K/uL 284 252 231   Lipid Panel Recent Labs    09/11/20 1031  CHOL 148  TRIG 103  LDLCALC 63  HDL 66     HEMOGLOBIN A1C Lab Results  Component Value Date   HGBA1C 6.1 (H) 03/15/2020   MPG 128.37 03/15/2020   TSH No results for input(s): TSH in the last 8760 hours.    Vitamin D 1, 25 (OH)2 Total 12/28/2019 pg/mL 34     External labs:  05/07/2019:  Creatinine 1.1, EGFR 49/59, potassium  4.1, BMP normal.  Normal H&H, MCH low at 25.5, MCHC 30.0, CBC otherwise normal.  Cholesterol 203, triglycerides 107, HDL 64, LDL 118. TSH normal.   Allergies   Allergies  Allergen Reactions   Amlodipine Other (See Comments) and Swelling    Legs swell some and she doesn't like it.   Fenofibrate Other (See Comments)    Leg pain   Omeprazole Other (See Comments)    Unknown   Simvastatin Other (See Comments)    Myalgia, leg pains   Singulair [Montelukast Sodium] Other (See Comments)    Affected mood   Statins Other (See Comments)    Myalgia, leg pains    Medications Prior to Visit:   Outpatient Medications Prior to Visit  Medication Sig Dispense Refill   acetaminophen (TYLENOL) 650 MG CR tablet Take 1,300 mg by mouth every 8 (eight) hours as needed for pain.      albuterol (VENTOLIN HFA) 108 (90 Base) MCG/ACT inhaler Inhale 1-2 puffs into the lungs every 6 (six) hours as needed for wheezing or shortness of breath.      ALPRAZolam (XANAX) 0.25 MG tablet Take 0.25 mg by mouth 3 (three) times daily as needed.     budesonide-formoterol (SYMBICORT) 160-4.5 MCG/ACT inhaler Inhale 2 puffs into the lungs daily.     buPROPion (WELLBUTRIN XL) 150 MG 24 hr tablet Take 150 mg by mouth daily.  CALCIUM-VITAMIN D PO Take 1 tablet by mouth daily.     cetirizine (ZYRTEC) 10 MG tablet Take 10 mg by mouth daily.     clopidogrel (PLAVIX) 75 MG tablet Take 1 tablet (75 mg total) by mouth daily. 90 tablet 3   furosemide (LASIX) 40 MG tablet Take 40 mg by mouth daily.     gabapentin (NEURONTIN) 300 MG capsule Take 1 capsule by mouth daily.     magnesium gluconate (MAGONATE) 500 MG tablet Take 500 mg by mouth daily.     Multiple Vitamins-Minerals (MULTIVITAMIN PO) Take 1 tablet by mouth daily.     nitroGLYCERIN (NITROSTAT) 0.4 MG SL tablet Place 1 tablet (0.4 mg total) under the tongue every 5 (five) minutes as needed for chest pain. 30 tablet 2   NON FORMULARY CPAP     olmesartan (BENICAR) 20 MG tablet  Take 2 tablets (40 mg total) by mouth daily. 30 tablet 3   ondansetron (ZOFRAN) 4 MG tablet Take 1 tablet (4 mg total) by mouth every 6 (six) hours. 12 tablet 0   pantoprazole (PROTONIX) 40 MG tablet Take 1 tablet (40 mg total) by mouth daily. (Patient taking differently: Take 40 mg by mouth 2 (two) times daily.) 90 tablet 3   PARoxetine (PAXIL) 40 MG tablet Take 40 mg by mouth every evening.      potassium chloride (KLOR-CON) 10 MEQ tablet Take 10 mEq by mouth daily.     PRALUENT 150 MG/ML SOAJ INJECT 1 SYRINGE INTO THE SKIN EVERY 14 (FOURTEEN) DAYS. 2 mL 2   traZODone (DESYREL) 150 MG tablet Take 150 mg by mouth at bedtime.     vitamin B-12 (CYANOCOBALAMIN) 1000 MCG tablet Take 1,000 mcg by mouth daily.     isosorbide mononitrate (IMDUR) 30 MG 24 hr tablet Take 1 tablet (30 mg total) by mouth daily. 90 tablet 3   meclizine (ANTIVERT) 25 MG tablet Take 1 tablet (25 mg total) by mouth 3 (three) times daily as needed for dizziness. 30 tablet 0   No facility-administered medications prior to visit.   Final Medications at End of Visit    Current Meds  Medication Sig   acetaminophen (TYLENOL) 650 MG CR tablet Take 1,300 mg by mouth every 8 (eight) hours as needed for pain.    albuterol (VENTOLIN HFA) 108 (90 Base) MCG/ACT inhaler Inhale 1-2 puffs into the lungs every 6 (six) hours as needed for wheezing or shortness of breath.    ALPRAZolam (XANAX) 0.25 MG tablet Take 0.25 mg by mouth 3 (three) times daily as needed.   budesonide-formoterol (SYMBICORT) 160-4.5 MCG/ACT inhaler Inhale 2 puffs into the lungs daily.   buPROPion (WELLBUTRIN XL) 150 MG 24 hr tablet Take 150 mg by mouth daily.   CALCIUM-VITAMIN D PO Take 1 tablet by mouth daily.   cetirizine (ZYRTEC) 10 MG tablet Take 10 mg by mouth daily.   clopidogrel (PLAVIX) 75 MG tablet Take 1 tablet (75 mg total) by mouth daily.   furosemide (LASIX) 40 MG tablet Take 40 mg by mouth daily.   gabapentin (NEURONTIN) 300 MG capsule Take 1 capsule by  mouth daily.   magnesium gluconate (MAGONATE) 500 MG tablet Take 500 mg by mouth daily.   Multiple Vitamins-Minerals (MULTIVITAMIN PO) Take 1 tablet by mouth daily.   nitroGLYCERIN (NITROSTAT) 0.4 MG SL tablet Place 1 tablet (0.4 mg total) under the tongue every 5 (five) minutes as needed for chest pain.   NON FORMULARY CPAP   olmesartan (BENICAR) 20 MG tablet  Take 2 tablets (40 mg total) by mouth daily.   ondansetron (ZOFRAN) 4 MG tablet Take 1 tablet (4 mg total) by mouth every 6 (six) hours.   pantoprazole (PROTONIX) 40 MG tablet Take 1 tablet (40 mg total) by mouth daily. (Patient taking differently: Take 40 mg by mouth 2 (two) times daily.)   PARoxetine (PAXIL) 40 MG tablet Take 40 mg by mouth every evening.    potassium chloride (KLOR-CON) 10 MEQ tablet Take 10 mEq by mouth daily.   PRALUENT 150 MG/ML SOAJ INJECT 1 SYRINGE INTO THE SKIN EVERY 14 (FOURTEEN) DAYS.   traZODone (DESYREL) 150 MG tablet Take 150 mg by mouth at bedtime.   vitamin B-12 (CYANOCOBALAMIN) 1000 MCG tablet Take 1,000 mcg by mouth daily.   [DISCONTINUED] isosorbide mononitrate (IMDUR) 30 MG 24 hr tablet Take 1 tablet (30 mg total) by mouth daily.   [DISCONTINUED] meclizine (ANTIVERT) 25 MG tablet Take 1 tablet (25 mg total) by mouth 3 (three) times daily as needed for dizziness.   Radiology  No results found.  MR angio of the neck and head 03/15/2020 without contrast: 1. No intracranial large vessel occlusion or proximal high-grade arterial stenosis. 2. 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 3. Acute right paramedian pontine infarct.  CT angio chest, abdomen, pelvis 08/13/2020: 1. No acute aortic abnormality. 2. Mild to moderate aortic atherosclerosis. 3. 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 4. 2 cm slightly hyperdense lesion off the lower pole of the right kidney which could represent a hemorrhagic/proteinaceous cyst. If further evaluation is  required would recommend dedicated renal ultrasound. 5.  Aortic Atherosclerosis (ICD10-I70.0).  Chest x-ray 08/13/2020: No active cardiopulmonary disease  Cardiac Studies:   Coronary CTA 03/30/2019:  1. Coronary calcium score of 33. This was 38 percentile for age and sex matched control. 2. Normal coronary origin with left dominance. 3. Moderate plaque with stenosis 50-69% in the mid LAD. CAD RADS 3. Additional analysis with CT FFR will be submitted.  Coronary CTA FFR 03/30/2019:  1. Left Main: No significant stenosis. 2. LAD: No significant stenosis. 3. LCX: No significant stenosis. 4. RCA: No significant stenosis. IMPRESSION: 1.  CT FFR analysis didn't show any significant stenosis.  Echocardiogram 03/15/2020  1. Technically difficult echo with poor image quality.   2. Left ventricular ejection fraction, by estimation, is 65 to 70%. The  left ventricle has normal function. The left ventricle has no regional  wall motion abnormalities. Left ventricular diastolic parameters are  consistent with Grade I diastolic dysfunction (impaired relaxation).   3. Right ventricular systolic function is normal. The right ventricular  size is normal.   4. The mitral valve is grossly normal. No evidence of mitral valve  regurgitation.   5. The aortic valve is grossly normal. Aortic valve regurgitation is not  visualized. No aortic stenosis is present.  Compared to 04/23/2019, moderate aortic regurgitation could not be visualized due to poor echo window.  Otherwise no significant change  Carotid duplex 08/22/2020: Minimal stenosis in the right internal carotid artery (1-15%). Stenosis in the right external carotid artery (>50%).  Minimal stenosis in the left internal carotid artery (1-15%). Stenosis in the left external carotid artery (<50%).  Antegrade right vertebral artery flow. Antegrade left vertebral artery flow.  External carotid stenosis may be the source of bruit.  Follow up studies is  appropriate if clinically indicated.  PCV MYOCARDIAL PERFUSION WO LEXISCAN 08/21/2020 Normal ECG stress. The patient exercised for  5 minutes and 0 seconds of a Bruce protocol, achieving approximately 6.83 METs.  Normal blood pressure response.  Accelerated heart rate response suggest aerobic intolerance.  Stress terminated due to dyspnea and fatigue. Myocardial perfusion: There is mild breast tissue attenuation inferiorly. There is no ischemia or scar. Overall LV systolic function is abnormal without regional wall motion abnormalities. Stress LV EF: 47%. Visually appears normal. The LV size was small and may be contributing to calculation of LVEF error. No previous exam available for comparison. Low risk.  EKG   09/05/2021: Sinus rhythm at a rate of 71 bpm.  Left atrial enlargement.  Normal axis.  Poor progression, cannot exclude anteroseptal infarct old.  Compared to EKG 03/06/2021, PRWP see again as in previous EKGs.   Assessment:     ICD-10-CM   1. Coronary artery disease of native artery of native heart with stable angina pectoris ([place])  I25.118 EKG 12-Lead    2. Benign essential HTN  I10 PCV ECHOCARDIOGRAM COMPLETE    3. Hypercholesteremia  E78.00     4. Moderate aortic regurgitation  I35.1 PCV ECHOCARDIOGRAM COMPLETE     Meds ordered this encounter  Medications   isosorbide mononitrate (IMDUR) 60 MG 24 hr tablet    Sig: Take 1 tablet (60 mg total) by mouth daily.    Dispense:  90 tablet    Refill:  3   Medications Discontinued During This Encounter  Medication Reason   meclizine (ANTIVERT) 25 MG tablet    isosorbide mononitrate (IMDUR) 30 MG 24 hr tablet Reorder    Recommendations:   Shelley Spencer  is a 75 y.o. female  with hypertension, hyperlipidemia, hyperglycemia, CKD stage 3, former tobacco use with moderate CAD with LAD 50-60% stenosis by coronary CTA on 03/30/2019  (not significant stenosis by FFR), moderate AR by echo in July 2020.  She is a former smoker that quit  in 1981.   Patient was admitted to the hospital on 03/14/2020 with gait disturbance involving her left lower extremity, had a right pontine stroke.  Fortunately she has recuperated well from this.  MRI showed moderate chronic small vessel disease. Of note she is statin intolerant.   Patient presents for 10-monthfollow-up.  Last office visit restarted olmesartan 20 mg daily,Repeat BMP remained stable.  Carotid artery surveillance remained stable with no significant stenosis noted on either side.  We will plan to follow-up as clinically indicated.  EKG and physical exam remained stable.  Advised watchful waiting in regards to patient's single episode of chest pain.  Recommend that she notify our office if she continues to have episodes, patient verbalized understanding agreement.  Given that she has had an episode of chest pain as well as blood pressure remains uncontrolled we will increase isosorbide mononitrate from 30 mg to 60 mg p.o. daily.  Patient does have a history of orthostasis, therefore advised patient to monitor for symptoms and blood pressure closely and notify our office of any issues.  Patient verbalized understanding agreement.  In regard to aortic regurgitation, will repeat echocardiogram at this time.  Could consider restarting beta-blocker therapy given CAD if aortic regurgitation has improved.  Have requested recent lab work from PCPs office.  Follow-up in 6 months, sooner if needed, for hypertension, hyperlipidemia, CAD, aortic regurgitation.   CAlethia Berthold PA-C 09/05/2021, 9:04 AM Office: [phone]

## 2021-09-05 ENCOUNTER — Ambulatory Visit: Payer: Medicare Other | Admitting: Student

## 2021-09-05 ENCOUNTER — Other Ambulatory Visit: Payer: Self-pay

## 2021-09-05 ENCOUNTER — Encounter: Payer: Self-pay | Admitting: Student

## 2021-09-05 VITALS — BP 139/80 | HR 82 | Temp 97.9°F | Ht 62.0 in | Wt 188.8 lb

## 2021-09-05 DIAGNOSIS — I10 Essential (primary) hypertension: Secondary | ICD-10-CM

## 2021-09-05 DIAGNOSIS — I25118 Atherosclerotic heart disease of native coronary artery with other forms of angina pectoris: Secondary | ICD-10-CM

## 2021-09-05 DIAGNOSIS — I351 Nonrheumatic aortic (valve) insufficiency: Secondary | ICD-10-CM

## 2021-09-05 DIAGNOSIS — E7800 Pure hypercholesterolemia, unspecified: Secondary | ICD-10-CM

## 2021-09-05 MED ORDER — ISOSORBIDE MONONITRATE ER 60 MG PO TB24: 60.0000 mg | ORAL_TABLET | Freq: Every day | ORAL | 3 refills | Status: DC

## 2021-09-07 NOTE — Progress Notes (Signed)
External labs 06/13/2021: Hgb 14.1, HCT 44.9, MCV 82.6, platelet 273 BUN 21, creatinine 1.1, GFR 40.7, sodium 142, potassium 4.3, Total cholesterol 135, triglycerides 139, HDL 50, LDL 57

## 2021-09-11 ENCOUNTER — Other Ambulatory Visit: Payer: Medicare Other

## 2021-10-08 ENCOUNTER — Other Ambulatory Visit: Payer: Self-pay

## 2021-10-08 ENCOUNTER — Ambulatory Visit: Payer: Medicare Other

## 2021-10-08 DIAGNOSIS — I351 Nonrheumatic aortic (valve) insufficiency: Secondary | ICD-10-CM

## 2021-10-08 DIAGNOSIS — I10 Essential (primary) hypertension: Secondary | ICD-10-CM

## 2021-10-20 ENCOUNTER — Other Ambulatory Visit: Payer: Self-pay | Admitting: Internal Medicine

## 2021-10-20 DIAGNOSIS — N644 Mastodynia: Secondary | ICD-10-CM

## 2021-10-24 ENCOUNTER — Other Ambulatory Visit: Payer: Self-pay | Admitting: Adult Health

## 2021-12-02 ENCOUNTER — Other Ambulatory Visit: Payer: Self-pay | Admitting: Cardiology

## 2021-12-02 DIAGNOSIS — Z789 Other specified health status: Secondary | ICD-10-CM

## 2021-12-02 DIAGNOSIS — E7800 Pure hypercholesterolemia, unspecified: Secondary | ICD-10-CM

## 2021-12-02 DIAGNOSIS — I2510 Atherosclerotic heart disease of native coronary artery without angina pectoris: Secondary | ICD-10-CM

## 2022-02-08 IMAGING — MR MR HEAD W/O CM
10 of 11 series · 43 of 48 positions shown · non-contrast
Comparison: CT head without contrast 03/15/2020

CLINICAL DATA: Episode of abnormal speech with left arm weakness.
Weakness and lethargy after getting blood.

EXAM:
MRI HEAD WITHOUT CONTRAST
TECHNIQUE: Multiplanar, multiecho pulse sequences of the brain and surrounding
structures were obtained without intravenous contrast.

[Series 5: DWI · axial · 3.0mm · 0.88mm/px · z∈[-131,+15]mm · 10 of 100 slices shown (1 of 4)]
[im 1/100]
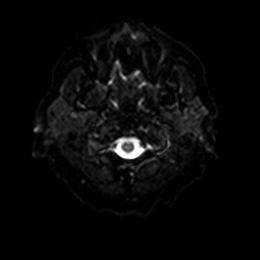
[im 12/100]
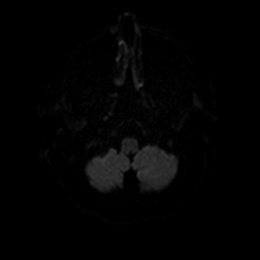
[im 23/100]
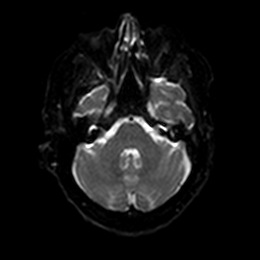
[im 34/100]
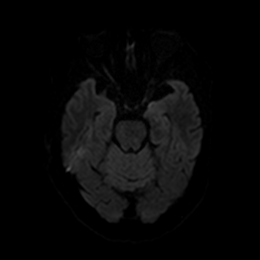
[im 45/100]
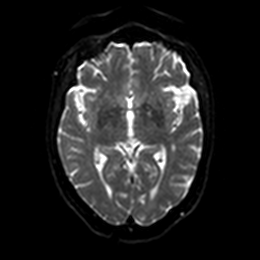
[im 56/100]
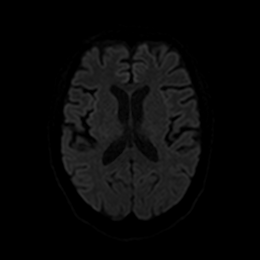
[im 67/100]
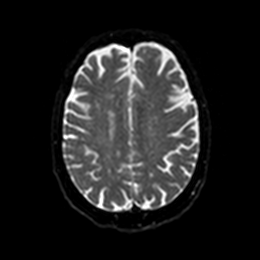
[im 78/100]
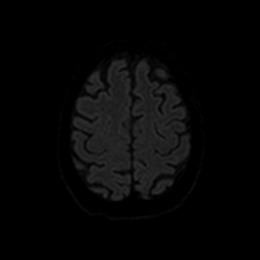
[im 89/100]
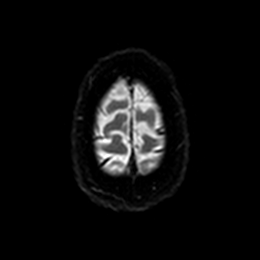
[im 100/100]
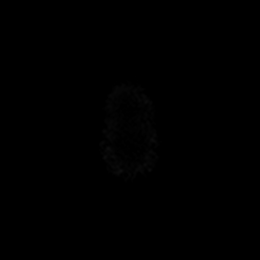

[Series 6: DWI · axial · 3.0mm · 0.88mm/px · z∈[-131,+15]mm · 5 of 50 slices shown (2 of 4)]
[im 1/50]
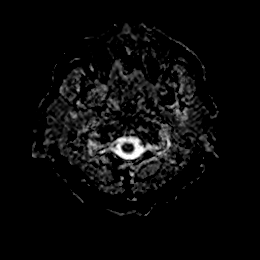
[im 13/50]
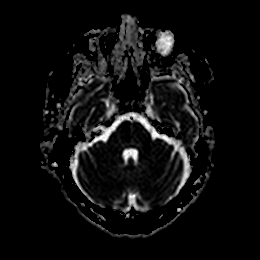
[im 25/50]
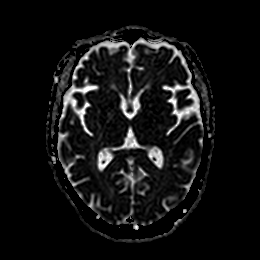
[im 37/50]
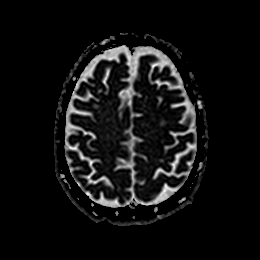
[im 50/50]
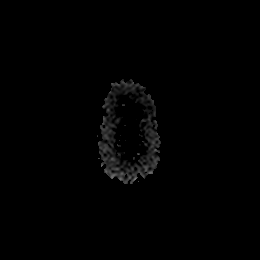

[Series 7: DWI · coronal · 4.0mm · 0.88mm/px · 6 of 64 slices shown (3 of 4)]
[im 1/64]
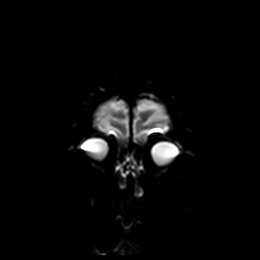
[im 13/64]
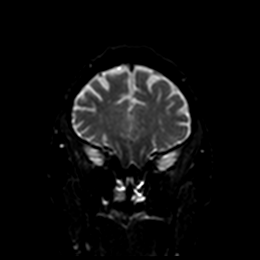
[im 26/64]
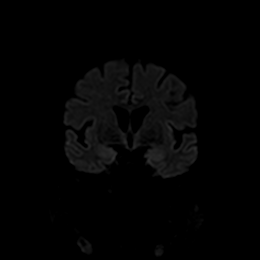
[im 38/64]
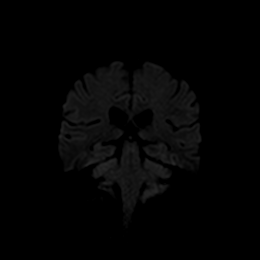
[im 51/64]
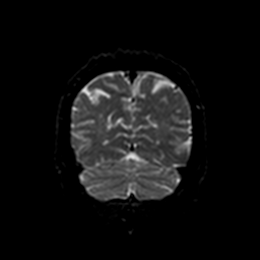
[im 64/64]
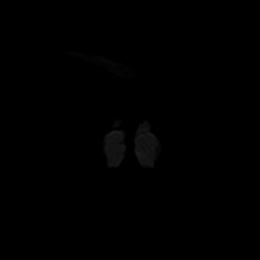

[Series 8: DWI · coronal · 4.0mm · 0.88mm/px · 3 of 32 slices shown (4 of 4)]
[im 1/32]
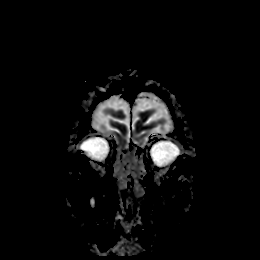
[im 16/32]
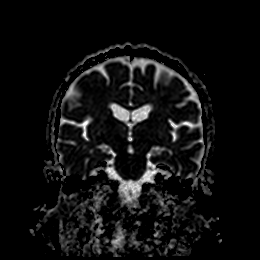
[im 32/32]
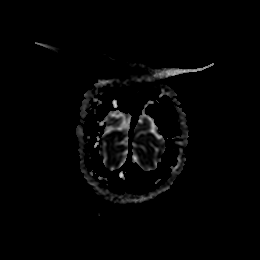

[Series 9: T1 · sagittal · 5.0mm · 0.75mm/px · 2 of 23 slices shown]
[im 1/23]
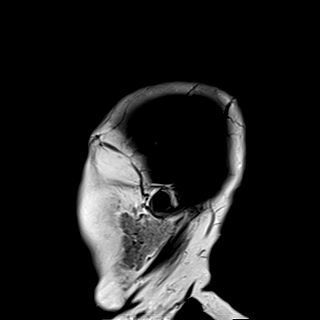
[im 23/23]
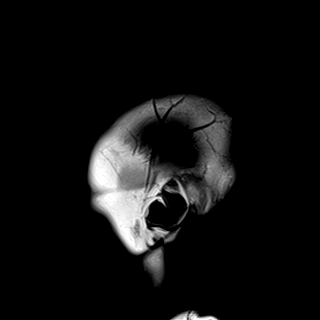

[Series 10: T2 · axial · 5.0mm · 0.72mm/px · z∈[-140,+14]mm · 2 of 27 slices shown (1 of 2)]
[im 1/27]
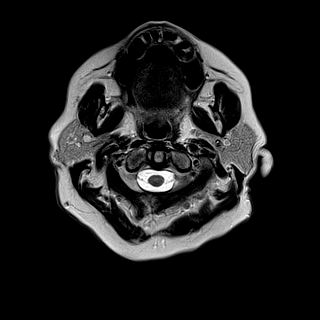
[im 27/27]
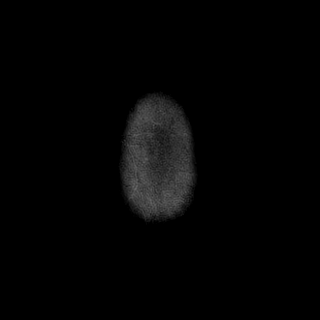

[Series 11: FLAIR · axial · 5.0mm · 0.45mm/px · z∈[-137,+17]mm · 2 of 27 slices shown]
[im 1/27]
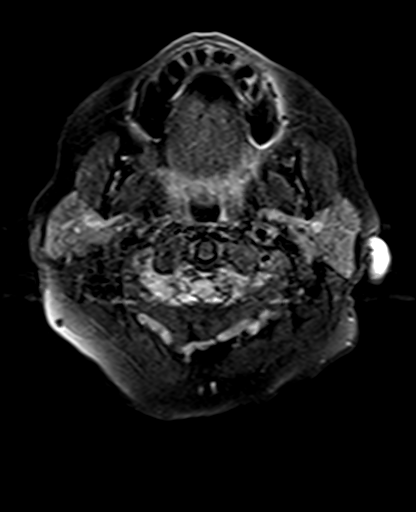
[im 27/27]
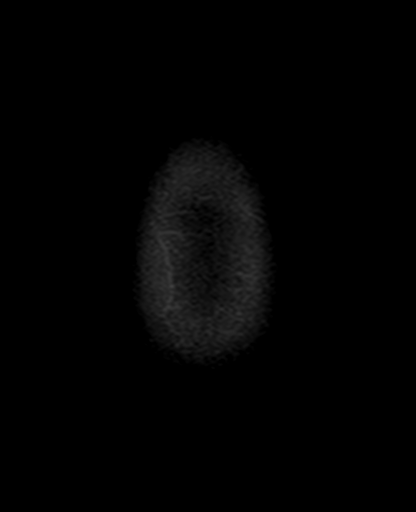

[Series 13: pha_images · axial · 3.0mm · 0.90mm/px · z∈[-140,+30]mm · 5 of 57 slices shown]
[im 1/57]
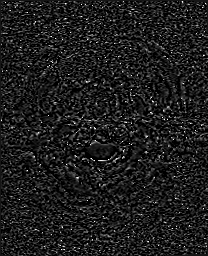
[im 15/57]
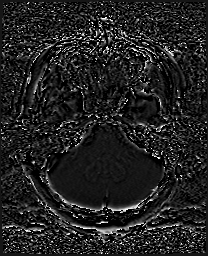
[im 29/57]
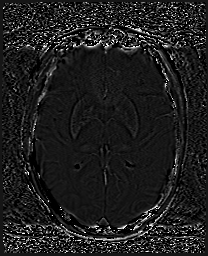
[im 43/57]
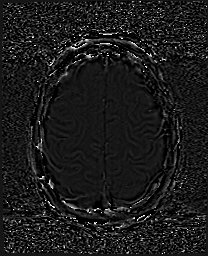
[im 57/57]
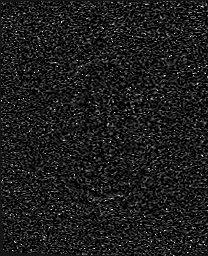

[Series 14: swi_images · axial · 3.0mm · 0.90mm/px · z∈[-143,+33]mm · 5 of 60 slices shown]
[im 1/60]
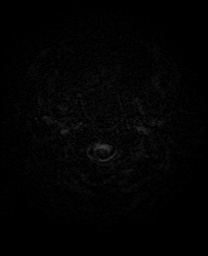
[im 15/60]
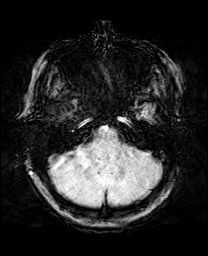
[im 30/60]
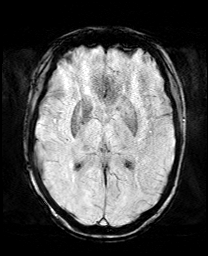
[im 45/60]
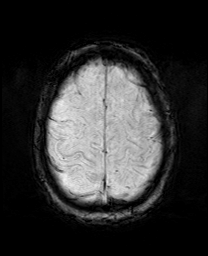
[im 60/60]
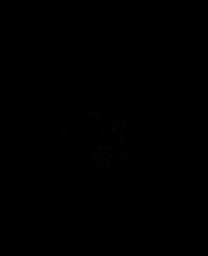

[Series 17: T2 · coronal · 5.0mm · 0.34mm/px · 3 of 29 slices shown (2 of 2)]
[im 1/29]
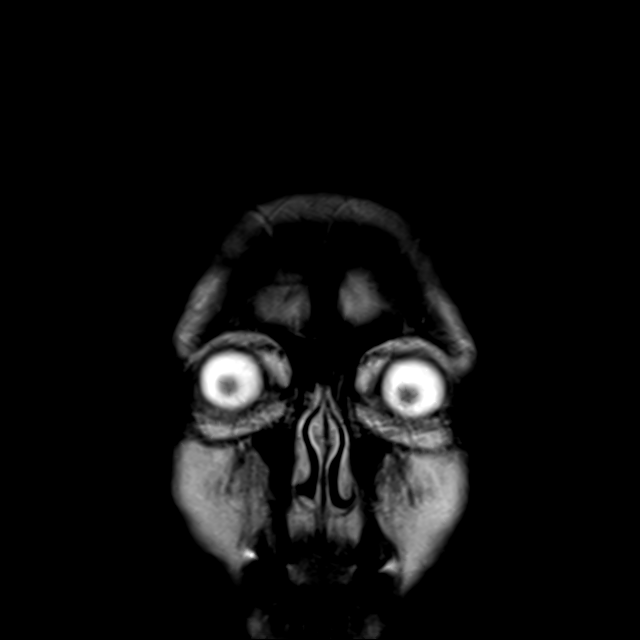
[im 15/29]
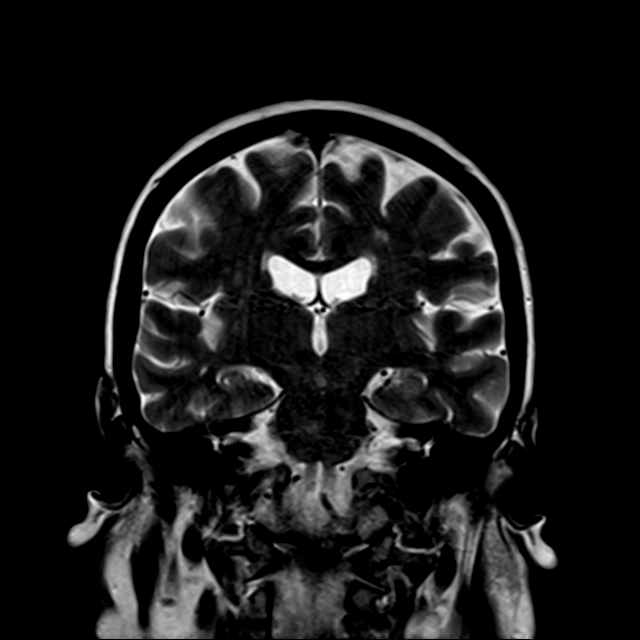
[im 29/29]
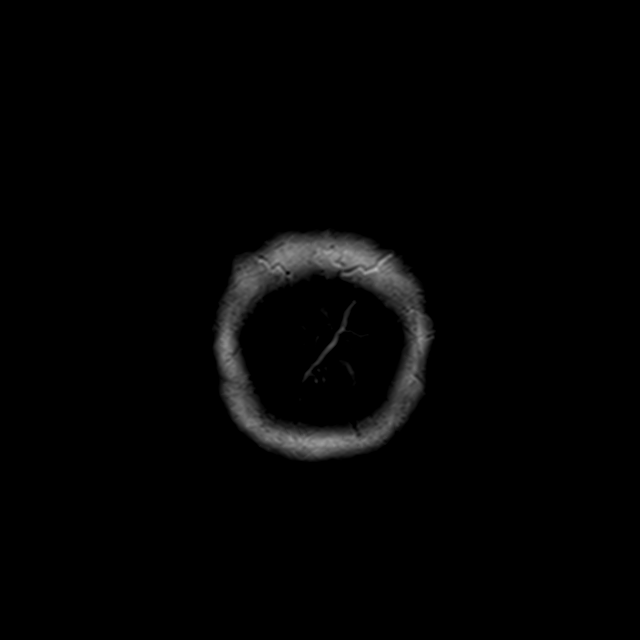

[43 of 48 positions shown; findings below may reference images not displayed]

FINDINGS: Brain: The diffusion-weighted images demonstrate an acute/subacute
nonhemorrhagic right paramedian pontine infarct no additional
infarcts are present. Subtle T2 signal changes are associated.

Periventricular and scattered subcortical T2 hyperintensities are
moderately advanced for age. T2 signal changes are present within
the corona radiata bilaterally. The ventricles are of normal size.
No significant extraaxial fluid collection is present.

The brainstem and cerebellum are within normal limits.

Vascular: Flow is present in the major intracranial arteries.

Skull and upper cervical spine: The craniocervical junction is
normal. Cervical spine hardware is noted at C4. Upper cervical spine
is otherwise within normal limits. Marrow signal is unremarkable.

Sinuses/Orbits: The paranasal sinuses and mastoid air cells are
clear. The globes and orbits are within normal limits.
IMPRESSION: 1. Acute/subacute nonhemorrhagic linear right paramedian pontine
infarct. This represents a basilar perforator infarct.
2. Periventricular and scattered subcortical T2 hyperintensities
bilaterally are moderately advanced for age. This likely reflects
the sequela of chronic microvascular ischemia.

These results were called by telephone at the time of interpretation
on 03/15/2020 at [DATE] to provider CELIWE NAKI , who verbally
acknowledged these results.

## 2022-03-04 NOTE — Progress Notes (Signed)
Primary Physician:  [name], MD   Patient ID: Shelley Spencer, female    DOB: 04-22-1946, 76 y.o.   MRN: [id number]  Subjective:    Chief Complaint  Patient presents with   Coronary Artery Disease   Follow-up    HPI: Shelley Spencer  is a 76 y.o. female  with asthma, hypertension, hyperlipidemia, hyperglycemia, CKD stage 3, former tobacco use with moderate CAD with LAD 50-60% stenosis by coronary CTA on 03/30/2019  (not significant stenosis by FFR), moderate AR by echo in July 2020.  She is a former smoker that quit in 1981. Patient was admitted to the hospital on 03/14/2020 with gait disturbance involving her left lower extremity, had a right pontine stroke.  Diagnosed with OSA CPAP, compliant.  Patient is statin intolerant, and has previously been on both Zetia and Vascepa.  She stopped Zetia due to muscle aches.  Patient stopped Vascepa due to constipation.  Patient was last seen in the office 09/05/2021 at which time given uncontrolled hypertension increased Imdur from 30 mg to 60 mg daily, also ordered repeat echocardiogram.  Echocardiogram revealed LVEF 58% with grade 1 diastolic dysfunction and mild valvular disease unchanged compared to 2020.  She now presents for 67-month follow-up.  Patient is feeling well overall without specific complaints today, she is tolerating increased dose of Imdur without issue.   Denies palpitations, dyspnea, syncope, near syncope, orthopnea, PND, leg edema.  Past Medical History:  Diagnosis Date   Anemia    Anxiety    Arthritis    Asthma    Bicornuate uterus    double cervix   CAD (coronary artery disease)    Colon polyp    Depression    Deviated septum    DJD (degenerative joint disease)    L-Spine   Fibroids    GERD (gastroesophageal reflux disease)    Hematuria    Hiatal hernia    HNP (herniated nucleus pulposus)    lower back   Hyperlipidemia    Hypertension    MVP (mitral valve prolapse)    pt denies, pt states she had a Echo  ?2008 and it did not show MVP   Pre-diabetes    Retinal tear 2005   Small right retinal tear   Shingles 1980's   on her waist   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    Past Surgical History:  Procedure Laterality Date   ANTERIOR CERVICAL DECOMP/DISCECTOMY FUSION N/A 09/05/2017   Procedure: Cervical four-five, Cervical five-six, Cervical six-seven Anterior cervical decompression/discectomy/fusion;  Surgeon: Maeola Harman, MD;  Location: [facility] OR;  Service: Neurosurgery;  Laterality: N/A;   bone spur surgery Bilateral    x 3   CARDIOVASCULAR STRESS TEST  02/08/2014   COLONOSCOPY     CYST EXCISION Left 09/29/2018   Procedure: LEFT MIDDLE FINGER CYST REMOVAL AND DEBRIDEMENT OF DISTAL INTERPHALANGEAL JOINT;  Surgeon: Cindee Salt, MD;  Location: [facility];  Service: Orthopedics;  Laterality: Left;   FINGER SURGERY Left    left third finger mucoid cyst removed   FOOT NEUROMA SURGERY Bilateral    HYSTEROSCOPY     D&C PMB Endo Cx Polyp   KNEE ARTHROSCOPY WITH MENISCAL REPAIR Right 2016   --Kristeen Miss, MD   LAPAROSCOPIC HYSTERECTOMY  7/209/10   R-TLH/BSO  Fibroids, BAck Pain, Adenomyosis   LUMBAR FUSION     removal vaginal septum     TOTAL KNEE ARTHROPLASTY Right 05/17/2019   Procedure: TOTAL KNEE ARTHROPLASTY;  Surgeon: Ollen Gross, MD;  Location: [facility] ORS;  Service: Orthopedics;  Laterality: Right;  38min   TUBAL LIGATION     Family History  Problem Relation Age of Onset   Heart failure Father        Possibly related to EtOH   Alcohol abuse Father    Emphysema Father    Emphysema Mother    Heart failure Mother        Possibly related to EtOH   Diabetes Mother    Alcohol abuse Mother    Valvular heart disease Brother    Bipolar disorder Brother    Crohn's disease Daughter    Thyroid disease Daughter    Colon cancer Neg Hx    Stomach cancer Neg Hx    Esophageal cancer Neg Hx    Rectal cancer Neg Hx    Liver cancer Neg Hx    Stroke Neg Hx     Social History    Tobacco Use   Smoking status: Former    Packs/day: 1.00    Years: 20.00    Total pack years: 20.00    Types: Cigarettes    Quit date: 09/24/1979    Years since quitting: 42.4   Smokeless tobacco: Never  Substance Use Topics   Alcohol use: Not Currently    Alcohol/week: 1.0 standard drink of alcohol    Types: 1 Standard drinks or equivalent per week    Marital status: Married   ROS   Review of Systems  Cardiovascular:  Negative for chest pain, claudication, dyspnea on exertion, leg swelling, near-syncope, orthopnea, palpitations, paroxysmal nocturnal dyspnea and syncope.  Respiratory:  Negative for shortness of breath.   Hematologic/Lymphatic: Does not bruise/bleed easily.  Gastrointestinal:  Negative for melena.  Neurological:  Negative for dizziness and weakness.   Objective:  Blood pressure 138/86, pulse 80, temperature 98 F (36.7 C), temperature source Temporal, resp. rate 16, height $RemoveBe'5\' 2"'iQqNtJJyp$  (1.575 m), weight 178 lb 12.8 oz (81.1 kg), SpO2 96 %. Body mass index is 32.7 kg/m.      03/05/2022    9:40 AM 09/05/2021    8:42 AM 09/05/2021    8:34 AM  Vitals with BMI  Height $Remov'5\' 2"'dHIchc$   '5\' 2"'$   Weight 178 lbs 13 oz  188 lbs 13 oz  BMI 30.16  01.09  Systolic 323 557 322  Diastolic 86 80 85  Pulse 80 82 58     Orthostatic VS for the past 72 hrs (Last 3 readings):  Patient Position BP Location Cuff Size  03/05/22 0940 Sitting Left Arm Large    Physical Exam Vitals reviewed.  Constitutional:      Appearance: She is well-developed.     Comments: Moderately obese  Cardiovascular:     Rate and Rhythm: Normal rate and regular rhythm.     Pulses: Intact distal pulses.          Carotid pulses are 2+ on the right side with bruit and 2+ on the left side with bruit.      Radial pulses are 2+ on the right side and 2+ on the left side.       Dorsalis pedis pulses are 2+ on the right side and 2+ on the left side.       Posterior tibial pulses are 1+ on the right side and 1+ on the  left side.     Heart sounds: S1 normal and S2 normal. Murmur heard.     High-pitched decrescendo early diastolic murmur is present with a grade of 2/4  at the upper right sternal border.     No gallop.     Comments: No JVD. Pulmonary:     Effort: Pulmonary effort is normal. No accessory muscle usage or respiratory distress.     Breath sounds: Normal breath sounds. No wheezing, rhonchi or rales.  Musculoskeletal:     Right lower leg: No edema.     Left lower leg: No edema.   Physical exam unchanged compared to previous office visit.  Laboratory examination:       Latest Ref Rng & Units 03/22/2021   11:15 AM 08/13/2020    5:11 PM 03/15/2020    1:46 AM  CMP  Glucose 70 - 99 mg/dL 100  91    BUN 8 - 23 mg/dL 16  19    Creatinine 0.44 - 1.00 mg/dL 1.12  1.35    Sodium 135 - 145 mmol/L 143  142    Potassium 3.5 - 5.1 mmol/L 4.0  3.7    Chloride 98 - 111 mmol/L 105  103    CO2 22 - 32 mmol/L 29  29    Calcium 8.9 - 10.3 mg/dL 10.0  9.3    Total Protein 6.5 - 8.1 g/dL 7.2   6.6   Total Bilirubin 0.3 - 1.2 mg/dL 0.4   0.6   Alkaline Phos 38 - 126 U/L 103   90   AST 15 - 41 U/L 17   31   ALT 0 - 44 U/L 17   18       Latest Ref Rng & Units 03/22/2021   11:15 AM 08/13/2020    5:11 PM 03/14/2020    2:13 PM  CBC  WBC 4.0 - 10.5 K/uL 5.8  7.4  7.3   Hemoglobin 12.0 - 15.0 g/dL 14.6  12.4  11.3   Hematocrit 36.0 - 46.0 % 46.8  40.4  37.7   Platelets 150 - 400 K/uL 284  252  231    Lipid Panel No results for input(s): "CHOL", "TRIG", "Juliaetta", "VLDL", "HDL", "CHOLHDL", "LDLDIRECT" in the last 8760 hours.    HEMOGLOBIN A1C Lab Results  Component Value Date   HGBA1C 6.1 (H) 03/15/2020   MPG 128.37 03/15/2020   TSH No results for input(s): "TSH" in the last 8760 hours.    Vitamin D 1, 25 (OH)2 Total 12/28/2019 pg/mL 34     External labs:  05/07/2019:  Creatinine 1.1, EGFR 49/59, potassium 4.1, BMP normal.  Normal H&H, MCH low at 25.5, MCHC 30.0, CBC otherwise normal.   Cholesterol 203, triglycerides 107, HDL 64, LDL 118. TSH normal.   Allergies   Allergies  Allergen Reactions   Amlodipine Other (See Comments) and Swelling    Legs swell some and she doesn't like it.   Fenofibrate Other (See Comments)    Leg pain   Omeprazole Other (See Comments)    Unknown   Simvastatin Other (See Comments)    Myalgia, leg pains   Singulair [Montelukast Sodium] Other (See Comments)    Affected mood   Statins Other (See Comments)    Myalgia, leg pains    Medications Prior to Visit:   Outpatient Medications Prior to Visit  Medication Sig Dispense Refill   acetaminophen (TYLENOL) 650 MG CR tablet Take 1,300 mg by mouth every 8 (eight) hours as needed for pain.      albuterol (VENTOLIN HFA) 108 (90 Base) MCG/ACT inhaler Inhale 1-2 puffs into the lungs every 6 (six) hours as needed for wheezing or shortness of  breath.      ALPRAZolam (XANAX) 0.25 MG tablet Take 0.25 mg by mouth 3 (three) times daily as needed.     budesonide-formoterol (SYMBICORT) 160-4.5 MCG/ACT inhaler Inhale 2 puffs into the lungs daily.     buPROPion (WELLBUTRIN XL) 150 MG 24 hr tablet Take 150 mg by mouth daily.     CALCIUM-VITAMIN D PO Take 1 tablet by mouth daily.     furosemide (LASIX) 40 MG tablet Take 40 mg by mouth daily.     gabapentin (NEURONTIN) 300 MG capsule Take 1 capsule by mouth daily.     isosorbide mononitrate (IMDUR) 60 MG 24 hr tablet Take 1 tablet (60 mg total) by mouth daily. 90 tablet 3   magnesium gluconate (MAGONATE) 500 MG tablet Take 500 mg by mouth daily.     Multiple Vitamins-Minerals (MULTIVITAMIN PO) Take 1 tablet by mouth daily.     nitroGLYCERIN (NITROSTAT) 0.4 MG SL tablet Place 1 tablet (0.4 mg total) under the tongue every 5 (five) minutes as needed for chest pain. 30 tablet 2   NON FORMULARY CPAP     olmesartan (BENICAR) 20 MG tablet Take 2 tablets (40 mg total) by mouth daily. 30 tablet 3   ondansetron (ZOFRAN) 4 MG tablet Take 1 tablet (4 mg total) by mouth  every 6 (six) hours. 12 tablet 0   OZEMPIC, 0.25 OR 0.5 MG/DOSE, 2 MG/3ML SOPN Inject into the skin.     pantoprazole (PROTONIX) 40 MG tablet Take 1 tablet (40 mg total) by mouth daily. (Patient taking differently: Take 40 mg by mouth 2 (two) times daily.) 90 tablet 3   PARoxetine (PAXIL) 40 MG tablet Take 40 mg by mouth every evening.      potassium chloride (KLOR-CON) 10 MEQ tablet Take 10 mEq by mouth daily.     PRALUENT 150 MG/ML SOAJ INJECT 1 SYRINGE INTO THE SKIN EVERY 14 (FOURTEEN) DAYS. 6 mL 3   traZODone (DESYREL) 150 MG tablet Take 150 mg by mouth at bedtime.     vitamin B-12 (CYANOCOBALAMIN) 1000 MCG tablet Take 1,000 mcg by mouth daily.     cetirizine (ZYRTEC) 10 MG tablet Take 10 mg by mouth daily.     clopidogrel (PLAVIX) 75 MG tablet TAKE 1 TABLET BY MOUTH EVERY DAY 90 tablet 3   No facility-administered medications prior to visit.   Final Medications at End of Visit    Current Meds  Medication Sig   acetaminophen (TYLENOL) 650 MG CR tablet Take 1,300 mg by mouth every 8 (eight) hours as needed for pain.    albuterol (VENTOLIN HFA) 108 (90 Base) MCG/ACT inhaler Inhale 1-2 puffs into the lungs every 6 (six) hours as needed for wheezing or shortness of breath.    ALPRAZolam (XANAX) 0.25 MG tablet Take 0.25 mg by mouth 3 (three) times daily as needed.   budesonide-formoterol (SYMBICORT) 160-4.5 MCG/ACT inhaler Inhale 2 puffs into the lungs daily.   buPROPion (WELLBUTRIN XL) 150 MG 24 hr tablet Take 150 mg by mouth daily.   CALCIUM-VITAMIN D PO Take 1 tablet by mouth daily.   furosemide (LASIX) 40 MG tablet Take 40 mg by mouth daily.   gabapentin (NEURONTIN) 300 MG capsule Take 1 capsule by mouth daily.   isosorbide mononitrate (IMDUR) 60 MG 24 hr tablet Take 1 tablet (60 mg total) by mouth daily.   magnesium gluconate (MAGONATE) 500 MG tablet Take 500 mg by mouth daily.   metoprolol succinate (TOPROL-XL) 25 MG 24 hr tablet Take 1 tablet (  25 mg total) by mouth daily. Take with  or immediately following a meal.   Multiple Vitamins-Minerals (MULTIVITAMIN PO) Take 1 tablet by mouth daily.   nitroGLYCERIN (NITROSTAT) 0.4 MG SL tablet Place 1 tablet (0.4 mg total) under the tongue every 5 (five) minutes as needed for chest pain.   NON FORMULARY CPAP   olmesartan (BENICAR) 20 MG tablet Take 2 tablets (40 mg total) by mouth daily.   ondansetron (ZOFRAN) 4 MG tablet Take 1 tablet (4 mg total) by mouth every 6 (six) hours.   OZEMPIC, 0.25 OR 0.5 MG/DOSE, 2 MG/3ML SOPN Inject into the skin.   pantoprazole (PROTONIX) 40 MG tablet Take 1 tablet (40 mg total) by mouth daily. (Patient taking differently: Take 40 mg by mouth 2 (two) times daily.)   PARoxetine (PAXIL) 40 MG tablet Take 40 mg by mouth every evening.    potassium chloride (KLOR-CON) 10 MEQ tablet Take 10 mEq by mouth daily.   PRALUENT 150 MG/ML SOAJ INJECT 1 SYRINGE INTO THE SKIN EVERY 14 (FOURTEEN) DAYS.   traZODone (DESYREL) 150 MG tablet Take 150 mg by mouth at bedtime.   vitamin B-12 (CYANOCOBALAMIN) 1000 MCG tablet Take 1,000 mcg by mouth daily.   Radiology  No results found.  MR angio of the neck and head 03/15/2020 without contrast: 1. No intracranial large vessel occlusion or proximal high-grade arterial stenosis. 2. 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 3. Acute right paramedian pontine infarct.  CT angio chest, abdomen, pelvis 08/13/2020: 1. No acute aortic abnormality. 2. Mild to moderate aortic atherosclerosis. 3. 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 4. 2 cm slightly hyperdense lesion off the lower pole of the right kidney which could represent a hemorrhagic/proteinaceous cyst. If further evaluation is required would recommend dedicated renal ultrasound. 5.  Aortic Atherosclerosis (ICD10-I70.0).  Chest x-ray 08/13/2020: No active cardiopulmonary disease  Cardiac Studies:   Coronary CTA 03/30/2019:  1. Coronary calcium score of  33. This was 3 percentile for age and sex matched control. 2. Normal coronary origin with left dominance. 3. Moderate plaque with stenosis 50-69% in the mid LAD. CAD RADS 3. Additional analysis with CT FFR will be submitted.  Coronary CTA FFR 03/30/2019:  1. Left Main: No significant stenosis. 2. LAD: No significant stenosis. 3. LCX: No significant stenosis. 4. RCA: No significant stenosis. IMPRESSION: 1.  CT FFR analysis didn't show any significant stenosis.  Echocardiogram 03/15/2020  1. Technically difficult echo with poor image quality.   2. Left ventricular ejection fraction, by estimation, is 65 to 70%. The  left ventricle has normal function. The left ventricle has no regional  wall motion abnormalities. Left ventricular diastolic parameters are  consistent with Grade I diastolic dysfunction (impaired relaxation).   3. Right ventricular systolic function is normal. The right ventricular  size is normal.   4. The mitral valve is grossly normal. No evidence of mitral valve  regurgitation.   5. The aortic valve is grossly normal. Aortic valve regurgitation is not  visualized. No aortic stenosis is present.  Compared to 04/23/2019, moderate aortic regurgitation could not be visualized due to poor echo window.  Otherwise no significant change  Carotid duplex 08/22/2020: Minimal stenosis in the right internal carotid artery (1-15%). Stenosis in the right external carotid artery (>50%).  Minimal stenosis in the left internal carotid artery (1-15%). Stenosis in the left external carotid artery (<50%).  Antegrade right vertebral artery flow. Antegrade left vertebral artery flow.  External carotid stenosis may be the source of bruit.  Follow up studies is appropriate if clinically indicated.  PCV MYOCARDIAL PERFUSION WO LEXISCAN 08/21/2020 Normal ECG stress. The patient exercised for 5 minutes and 0 seconds of a Bruce protocol, achieving approximately 6.83 METs.  Normal blood pressure  response.  Accelerated heart rate response suggest aerobic intolerance.  Stress terminated due to dyspnea and fatigue. Myocardial perfusion: There is mild breast tissue attenuation inferiorly. There is no ischemia or scar. Overall LV systolic function is abnormal without regional wall motion abnormalities. Stress LV EF: 47%. Visually appears normal. The LV size was small and may be contributing to calculation of LVEF error. No previous exam available for comparison. Low risk.  PCV ECHOCARDIOGRAM COMPLETE 80/88/1103 Normal LV systolic function with EF 58%. Left ventricle cavity is normal in size. Normal left ventricular wall thickness. Normal global wall motion. Doppler evidence of grade I (impaired) diastolic dysfunction, normal LAP. Calculated EF 58%. Trileaflet aortic valve. No evidence of aortic stenosis. Mild (Grade I) aortic regurgitation. Mild aortic valve leaflet thickening. Structurally normal mitral valve. No evidence of mitral stenosis. Mild (Grade I) mitral regurgitation. No significant change from 04/23/2019.  EKG  03/05/2022: Sinus rhythm at a rate of 75 bpm.  Normal axis.  No evidence of ischemia or underlying injury pattern.  09/05/2021: Sinus rhythm at a rate of 71 bpm.  Left atrial enlargement.  Normal axis.  Poor progression, cannot exclude anteroseptal infarct old.  Compared to EKG 03/06/2021, PRWP see again as in previous EKGs.   Assessment:     ICD-10-CM   1. Coronary artery disease of native artery of native heart with stable angina pectoris ([place])  I25.118 EKG 12-Lead    2. Benign essential HTN  I10     3. Hypercholesteremia  E78.00      Meds ordered this encounter  Medications   metoprolol succinate (TOPROL-XL) 25 MG 24 hr tablet    Sig: Take 1 tablet (25 mg total) by mouth daily. Take with or immediately following a meal.    Dispense:  30 tablet    Refill:  3   There are no discontinued medications.   Recommendations:   Shelley Spencer  is a 76 y.o. female   with hypertension, hyperlipidemia, hyperglycemia, CKD stage 3, former tobacco use with moderate CAD with LAD 50-60% stenosis by coronary CTA on 03/30/2019  (not significant stenosis by FFR), moderate AR by echo in July 2020.  She is a former smoker that quit in 1981.   Patient was admitted to the hospital on 03/14/2020 with gait disturbance involving her left lower extremity, had a right pontine stroke.  Fortunately she has recuperated well from this.  MRI showed moderate chronic small vessel disease. Of note she is statin intolerant.   Patient was last seen in the office 09/05/2021 at which time given uncontrolled hypertension increased Imdur from 30 mg to 60 mg daily, also ordered repeat echocardiogram.  Echocardiogram revealed LVEF 58% with grade 1 diastolic dysfunction and mild valvular disease unchanged compared to 2020.  She now presents for 59-month follow-up.  Reviewed and discussed results of echocardiogram, details above.  Aortic regurgitation is now mild instead of moderate.  Therefore shared decision was to resume beta-blocker therapy with metoprolol succinate 25 mg p.o. daily in view of underlying CAD.  We will continue other medications presently.  She has upcoming physical with PCP, will defer laboratory testing.  Follow-up in 6 months, sooner if needed.   Shelley Berthold, PA-C 03/05/2022,  10:57 AM Office: [phone]

## 2022-03-05 ENCOUNTER — Ambulatory Visit: Payer: Medicare Other | Admitting: Student

## 2022-03-05 ENCOUNTER — Encounter: Payer: Self-pay | Admitting: Student

## 2022-03-05 VITALS — BP 138/86 | HR 80 | Temp 98.0°F | Resp 16 | Ht 62.0 in | Wt 178.8 lb

## 2022-03-05 DIAGNOSIS — I10 Essential (primary) hypertension: Secondary | ICD-10-CM

## 2022-03-05 DIAGNOSIS — E7800 Pure hypercholesterolemia, unspecified: Secondary | ICD-10-CM

## 2022-03-05 DIAGNOSIS — I25118 Atherosclerotic heart disease of native coronary artery with other forms of angina pectoris: Secondary | ICD-10-CM

## 2022-03-05 MED ORDER — METOPROLOL SUCCINATE ER 25 MG PO TB24: 25.0000 mg | ORAL_TABLET | Freq: Every day | ORAL | 3 refills | Status: DC

## 2022-04-17 ENCOUNTER — Other Ambulatory Visit (HOSPITAL_COMMUNITY): Payer: Self-pay

## 2022-04-17 MED ORDER — TRULICITY 3 MG/0.5ML ~~LOC~~ SOAJ
SUBCUTANEOUS | 11 refills | Status: DC
  Filled 2022-04-17: qty 2, 28d supply, fill #0

## 2022-05-20 ENCOUNTER — Ambulatory Visit (INDEPENDENT_AMBULATORY_CARE_PROVIDER_SITE_OTHER): Payer: Medicare Other | Admitting: Family Medicine

## 2022-05-20 ENCOUNTER — Encounter: Payer: Self-pay | Admitting: Family Medicine

## 2022-05-20 VITALS — BP 120/77 | HR 82 | Ht 62.0 in | Wt 176.0 lb

## 2022-05-20 DIAGNOSIS — G4733 Obstructive sleep apnea (adult) (pediatric): Secondary | ICD-10-CM | POA: Diagnosis not present

## 2022-05-20 DIAGNOSIS — Z9989 Dependence on other enabling machines and devices: Secondary | ICD-10-CM | POA: Diagnosis not present

## 2022-05-20 DIAGNOSIS — G4736 Sleep related hypoventilation in conditions classified elsewhere: Secondary | ICD-10-CM | POA: Diagnosis not present

## 2022-05-20 NOTE — Patient Instructions (Signed)

## 2022-05-20 NOTE — Progress Notes (Signed)
PATIENT: Shelley Spencer DOB: [birth year]  REASON FOR VISIT: follow up HISTORY FROM: patient  Chief Complaint  Patient presents with   Follow-up    Pt alone, rm 11. Presents for f/u.  DME [facility]. Overall stable.     HISTORY OF PRESENT ILLNESS:  05/20/22 ALL:  Shelley Spencer returns for follow up for OSA on CPAP with hypoxia treated with O2 2L/min. She is doing very well on therapy. She denies concerns with CPAp or supplies. She is tolerating pressure settings.     05/17/2021 ALL:  Shelley Spencer is a 76 y.o. female here today for follow up for OSA on CPAP.  PSG 10/2020 showed mild OSA with AHI 10.3/hr, exacerbated in REM to 30/hr and hypoxemia was noted. Titration study showed reponse at [place] and 2L/O2. AutoPAP was ordered at 5-13cmH20with small Simplus FFM. She has not been able to get oxygen covered by insurance but has ONO scheduled this month. She does feel much better rested. She is sleeping better. She had to go to a local [facility] to get new masks, had trouble with customer service line.   Compliance report dated 04/17/2021-05/16/2021 shows she used CPAP 30/30 nights and 27/30 nights for greater than 4 hours. Average usage was 7 hours. Residual AHI was 4.8 on 5-13cmH20. No leak.   HISTORY: (copied from Dr Dohmeier's previous note)  Shelley Spencer is a 76 y.o. year old White or Caucasian female patient seen here as a referral on 10/18/2020 from Dr Virgina Jock. Chief concern according to patient : see above :   Shelley Spencer presents today upon referral by her cardiology practice, her primary care physician is Dr. Haynes Kerns but in June 2021 she had developed a gait disturbance involving her left lower extremity and was diagnosed with a right pontine stroke she recuperated well from this an MRI had showed moderate chronic small vessel disease.  She had to stop cholesterol medication due to muscle aches.  In November she presented to the [facility] emergency department for complaints of  chest pain troponins were negative EKG without changes she was evaluated by Dr. Dr. Lemmie Evens at the time started on Imdur for what was considered chronic stable angina.  She had no recurrence of the chest pain.  But they have been reports that the patient had dropping oxygen levels while she was sleeping and observed.    Shelley Spencer is a right -handed Caucasian female with a possible sleep disorder. She  has a past medical history of Anemia, Anxiety, Arthritis, Asthma, Bicornuate uterus, CAD (coronary artery disease), Colon polyp, clinical Depression, Deviated nasal septum, DJD (degenerative joint disease), Fibroids, GERD (gastroesophageal reflux disease), Hematuria, Hiatal hernia, HNP (herniated nucleus pulposus), Hyperlipidemia, Hypertension, MVP (mitral valve prolapse), Pre-diabetes, Retinal tear (2005), and Shingles (1980's)..   Family medical /sleep history:no other family member on CPAP with OSA, both parents had COPD.    Social history:  Patient is working as as a Pharmacist, hospital, she married a much older man and he has chronic health condition, culminating in a heart transplant.   she lives in a household with spouse, her children are grown. A 42 year old son lives with them and a 61 year-old grandchild, The boy overdosed on Fentanyl while in mother's hoe, now son has custody. Son is a recovering alcoholic.   The patient currently works full time in special ed. Pets are present. A dog and a cat.  Tobacco use see above .The patient has a remote history of  smoking. Age [age], ETOH use - seldom,  Caffeine intake in form of Coffee( 1 cup a day) Soda( /) Tea (iced tea - 3 glasses a day. )  Regular exercise - not much.      Sleep habits are as follows: The patient's dinner time is between 6-7 PM. The patient goes to bed at 10-11 PM, takes trazodone. Sleeps in a cool, quiet and dark bedroom- husband is deaf.  She continues to sleep for intervals of 1-2 hours when not on Trazodone, wakes for 1 bathroom  break.  The preferred sleep position is on her side, with the support of 1-2 pillows.  Dreams are reportedly infrequent but /vivid.  7  AM is the usual rise time.  The patient wakes up with an alarm. Likes to snooze the alarm.  She reports not feeling refreshed or restored in AM, with symptoms such as dry mouth , but no morning headaches, and residual fatigue. By lunchtime she gets sleepy again.  Naps are taken infrequently.   REVIEW OF SYSTEMS: Out of a complete 14 system review of symptoms, the patient complains only of the following symptoms, fatigue and all other reviewed systems are negative.  ESS: 3  ALLERGIES: Allergies  Allergen Reactions   Amlodipine Other (See Comments) and Swelling    Legs swell some and she doesn't like it.   Fenofibrate Other (See Comments)    Leg pain   Omeprazole Other (See Comments)    Unknown   Simvastatin Other (See Comments)    Myalgia, leg pains   Singulair [Montelukast Sodium] Other (See Comments)    Affected mood   Statins Other (See Comments)    Myalgia, leg pains    HOME MEDICATIONS: Outpatient Medications Prior to Visit  Medication Sig Dispense Refill   acetaminophen (TYLENOL) 650 MG CR tablet Take 1,300 mg by mouth every 8 (eight) hours as needed for pain.      albuterol (VENTOLIN HFA) 108 (90 Base) MCG/ACT inhaler Inhale 1-2 puffs into the lungs every 6 (six) hours as needed for wheezing or shortness of breath.      ALPRAZolam (XANAX) 0.25 MG tablet Take 0.25 mg by mouth 3 (three) times daily as needed.     budesonide-formoterol (SYMBICORT) 160-4.5 MCG/ACT inhaler Inhale 2 puffs into the lungs daily.     buPROPion (WELLBUTRIN XL) 150 MG 24 hr tablet Take 150 mg by mouth daily.     CALCIUM-VITAMIN D PO Take 1 tablet by mouth daily.     cetirizine (ZYRTEC) 10 MG tablet Take 10 mg by mouth daily.     clopidogrel (PLAVIX) 75 MG tablet TAKE 1 TABLET BY MOUTH EVERY DAY 90 tablet 3   Dulaglutide (TRULICITY) 3 OI/7.8MV SOPN Inject '3mg'$  into  the skin once a week 2 mL 11   furosemide (LASIX) 40 MG tablet Take 40 mg by mouth daily.     gabapentin (NEURONTIN) 300 MG capsule Take 1 capsule by mouth daily.     isosorbide mononitrate (IMDUR) 60 MG 24 hr tablet Take 1 tablet (60 mg total) by mouth daily. 90 tablet 3   magnesium gluconate (MAGONATE) 500 MG tablet Take 500 mg by mouth daily.     metoprolol succinate (TOPROL-XL) 25 MG 24 hr tablet Take 1 tablet (25 mg total) by mouth daily. Take with or immediately following a meal. 30 tablet 3   Multiple Vitamins-Minerals (MULTIVITAMIN PO) Take 1 tablet by mouth daily.     nitroGLYCERIN (NITROSTAT) 0.4 MG SL tablet Place 1 tablet (0.4  mg total) under the tongue every 5 (five) minutes as needed for chest pain. 30 tablet 2   NON FORMULARY CPAP     olmesartan (BENICAR) 20 MG tablet Take 2 tablets (40 mg total) by mouth daily. 30 tablet 3   ondansetron (ZOFRAN) 4 MG tablet Take 1 tablet (4 mg total) by mouth every 6 (six) hours. 12 tablet 0   pantoprazole (PROTONIX) 40 MG tablet Take 1 tablet (40 mg total) by mouth daily. (Patient taking differently: Take 40 mg by mouth 2 (two) times daily.) 90 tablet 3   PARoxetine (PAXIL) 40 MG tablet Take 40 mg by mouth every evening.      potassium chloride (KLOR-CON) 10 MEQ tablet Take 10 mEq by mouth daily.     PRALUENT 150 MG/ML SOAJ INJECT 1 SYRINGE INTO THE SKIN EVERY 14 (FOURTEEN) DAYS. 6 mL 3   traZODone (DESYREL) 150 MG tablet Take 150 mg by mouth at bedtime.     vitamin B-12 (CYANOCOBALAMIN) 1000 MCG tablet Take 1,000 mcg by mouth daily.     OZEMPIC, 0.25 OR 0.5 MG/DOSE, 2 MG/3ML SOPN Inject into the skin.     No facility-administered medications prior to visit.    PAST MEDICAL HISTORY: Past Medical History:  Diagnosis Date   Anemia    Anxiety    Arthritis    Asthma    Bicornuate uterus    double cervix   CAD (coronary artery disease)    Colon polyp    Depression    Deviated septum    DJD (degenerative joint disease)    L-Spine    Fibroids    GERD (gastroesophageal reflux disease)    Hematuria    Hiatal hernia    HNP (herniated nucleus pulposus)    lower back   Hyperlipidemia    Hypertension    MVP (mitral valve prolapse)    pt denies, pt states she had a Echo ?2008 and it did not show MVP   Pre-diabetes    Retinal tear 2005   Small right retinal tear   Shingles 1980's   on her waist   Stroke (cerebrum) ([place]) 02/2020    PAST SURGICAL HISTORY: Past Surgical History:  Procedure Laterality Date   ANTERIOR CERVICAL DECOMP/DISCECTOMY FUSION N/A 09/05/2017   Procedure: Cervical four-five, Cervical five-six, Cervical six-seven Anterior cervical decompression/discectomy/fusion;  Surgeon: Erline Levine, MD;  Location: [facility];  Service: Neurosurgery;  Laterality: N/A;   bone spur surgery Bilateral    x 3   CARDIOVASCULAR STRESS TEST  02/08/2014   COLONOSCOPY     CYST EXCISION Left 09/29/2018   Procedure: LEFT MIDDLE FINGER CYST REMOVAL AND DEBRIDEMENT OF DISTAL INTERPHALANGEAL JOINT;  Surgeon: Daryll Brod, MD;  Location: [facility];  Service: Orthopedics;  Laterality: Left;   FINGER SURGERY Left    left third finger mucoid cyst removed   FOOT NEUROMA SURGERY Bilateral    HYSTEROSCOPY     D&C PMB Endo Cx Polyp   KNEE ARTHROSCOPY WITH MENISCAL REPAIR Right 2016   --Flossie Dibble, MD   LAPAROSCOPIC HYSTERECTOMY  7/209/10   R-TLH/BSO  Fibroids, BAck Pain, Adenomyosis   LUMBAR FUSION     removal vaginal septum     TOTAL KNEE ARTHROPLASTY Right 05/17/2019   Procedure: TOTAL KNEE ARTHROPLASTY;  Surgeon: Gaynelle Arabian, MD;  Location: [facility];  Service: Orthopedics;  Laterality: Right;  26mn   TUBAL LIGATION      FAMILY HISTORY: Family History  Problem Relation Age of Onset   Heart  failure Father        Possibly related to EtOH   Alcohol abuse Father    Emphysema Father    Emphysema Mother    Heart failure Mother        Possibly related to EtOH   Diabetes Mother    Alcohol abuse Mother     Valvular heart disease Brother    Bipolar disorder Brother    Crohn's disease Daughter    Thyroid disease Daughter    Colon cancer Neg Hx    Stomach cancer Neg Hx    Esophageal cancer Neg Hx    Rectal cancer Neg Hx    Liver cancer Neg Hx    Stroke Neg Hx     SOCIAL HISTORY: Social History   Socioeconomic History   Marital status: Married    Spouse name: Not on file   Number of children: 2   Years of education: Not on file   Highest education level: Not on file  Occupational History   Occupation: [employer] teacher  Tobacco Use   Smoking status: Former    Packs/day: 1.00    Years: 20.00    Total pack years: 20.00    Types: Cigarettes    Quit date: 09/24/1979    Years since quitting: 42.6   Smokeless tobacco: Never  Vaping Use   Vaping Use: Never used  Substance and Sexual Activity   Alcohol use: Not Currently    Alcohol/week: 1.0 standard drink of alcohol    Types: 1 Standard drinks or equivalent per week   Drug use: No   Sexual activity: Yes    Partners: Male    Birth control/protection: Surgical    Comment: R-TLH/BSO  Other Topics Concern   Not on file  Social History Narrative   Husband with heart transplant.  Lives with husband and son.   Social Determinants of Health   Financial Resource Strain: Not on file  Food Insecurity: Not on file  Transportation Needs: Not on file  Physical Activity: Not on file  Stress: Not on file  Social Connections: Not on file  Intimate Partner Violence: Not on file     PHYSICAL EXAM  Vitals:   05/20/22 1256  BP: 120/77  Pulse: 82  Weight: 176 lb (79.8 kg)  Height: '5\' 2"'$  (1.575 m)    Body mass index is 32.19 kg/m.  Generalized: Well developed, in no acute distress  Cardiology: normal rate and rhythm, no murmur noted Respiratory: clear to auscultation bilaterally  Neurological examination  Mentation: Alert oriented to time, place, history taking. Follows all commands speech and language fluent Cranial nerve II-XII:  Pupils were equal round reactive to light. Extraocular movements were full, visual field were full  Motor: The motor testing reveals 5 over 5 strength of all 4 extremities. Good symmetric motor tone is noted throughout.  Gait and station: Gait is normal.    DIAGNOSTIC DATA (LABS, IMAGING, TESTING) - I reviewed patient records, labs, notes, testing and imaging myself where available.      No data to display           Lab Results  Component Value Date   WBC 5.8 03/22/2021   HGB 14.6 03/22/2021   HCT 46.8 (H) 03/22/2021   MCV 83.9 03/22/2021   PLT 284 03/22/2021      Component Value Date/Time   NA 143 03/22/2021 1115   NA 145 (H) 03/19/2019 1159   K 4.0 03/22/2021 1115   CL 105 03/22/2021 1115  CO2 29 03/22/2021 1115   GLUCOSE 100 (H) 03/22/2021 1115   BUN 16 03/22/2021 1115   BUN 20 03/19/2019 1159   CREATININE 1.12 (H) 03/22/2021 1115   CALCIUM 10.0 03/22/2021 1115   PROT 7.2 03/22/2021 1115   ALBUMIN 4.2 03/22/2021 1115   AST 17 03/22/2021 1115   ALT 17 03/22/2021 1115   ALKPHOS 103 03/22/2021 1115   BILITOT 0.4 03/22/2021 1115   GFRNONAA 52 (L) 03/22/2021 1115   GFRAA 40 (L) 03/14/2020 1413   Lab Results  Component Value Date   CHOL 148 09/11/2020   HDL 66 09/11/2020   LDLCALC 63 09/11/2020   TRIG 103 09/11/2020   CHOLHDL 2.5 03/15/2020   Lab Results  Component Value Date   HGBA1C 6.1 (H) 03/15/2020   No results found for: "VITAMINB12" Lab Results  Component Value Date   TSH 2.500 03/15/2020     ASSESSMENT AND PLAN 76 y.o. year old female  has a past medical history of Anemia, Anxiety, Arthritis, Asthma, Bicornuate uterus, CAD (coronary artery disease), Colon polyp, Depression, Deviated septum, DJD (degenerative joint disease), Fibroids, GERD (gastroesophageal reflux disease), Hematuria, Hiatal hernia, HNP (herniated nucleus pulposus), Hyperlipidemia, Hypertension, MVP (mitral valve prolapse), Pre-diabetes, Retinal tear (2005), Shingles (1980's), and  Stroke (cerebrum) ([place]) (02/2020). here with     ICD-10-CM   1. OSA on CPAP  G47.33 For home use only DME continuous positive airway pressure (CPAP)   Z99.89     2. Hypoxemia associated with sleep  G47.36       YVONNIA TANGO is doing well on CPAP therapy. Compliance report reveals excellent compliance. She was encouraged to continue using CPAP nightly and for greater than 4 hours each night. Risks of untreated sleep apnea review and education materials provided. Healthy lifestyle habits encouraged. She will follow up in  year, sooner if needed. he verbalizes understanding and agreement with this plan.    Orders Placed This Encounter  Procedures   For home use only DME continuous positive airway pressure (CPAP)    Supplies    Order Specific Question:   Length of Need    Answer:   Lifetime    Order Specific Question:   Patient has OSA or probable OSA    Answer:   Yes    Order Specific Question:   Is the patient currently using CPAP in the home    Answer:   Yes    Order Specific Question:   Settings    Answer:   Other see comments    Order Specific Question:   CPAP supplies needed    Answer:   Mask, headgear, cushions, filters, heated tubing and water chamber      No orders of the defined types were placed in this encounter.      Debbora Presto, FNP-C 05/20/2022, 4:05 PM [facility] [address], [place], [place] [address] [phone]

## 2022-05-20 NOTE — Progress Notes (Signed)
Order for cpap supplies sent to [facility] via community message. Confirmation received that the order transmitted was successful.  

## 2022-06-05 ENCOUNTER — Other Ambulatory Visit (HOSPITAL_COMMUNITY): Payer: Self-pay

## 2022-06-05 MED ORDER — MOUNJARO 10 MG/0.5ML ~~LOC~~ SOAJ
SUBCUTANEOUS | 5 refills | Status: DC
  Filled 2022-06-05: qty 2, 28d supply, fill #0
  Filled 2022-07-04: qty 2, 28d supply, fill #1
  Filled 2022-08-17: qty 2, 28d supply, fill #2
  Filled 2022-09-18: qty 2, 28d supply, fill #3
  Filled 2022-10-31: qty 2, 28d supply, fill #4
  Filled 2023-04-10: qty 2, 28d supply, fill #5

## 2022-06-11 ENCOUNTER — Other Ambulatory Visit (HOSPITAL_COMMUNITY): Payer: Self-pay

## 2022-07-03 ENCOUNTER — Encounter: Payer: Self-pay | Admitting: Cardiology

## 2022-07-05 ENCOUNTER — Other Ambulatory Visit (HOSPITAL_COMMUNITY): Payer: Self-pay

## 2022-07-09 ENCOUNTER — Other Ambulatory Visit (HOSPITAL_COMMUNITY): Payer: Self-pay

## 2022-07-09 IMAGING — DX DG CHEST 2V
2 series · 2 of 2 positions shown · non-contrast
Comparison: 12/22/2013

CLINICAL DATA: Chest pain

EXAM:
CHEST - 2 VIEW

[chest pa]
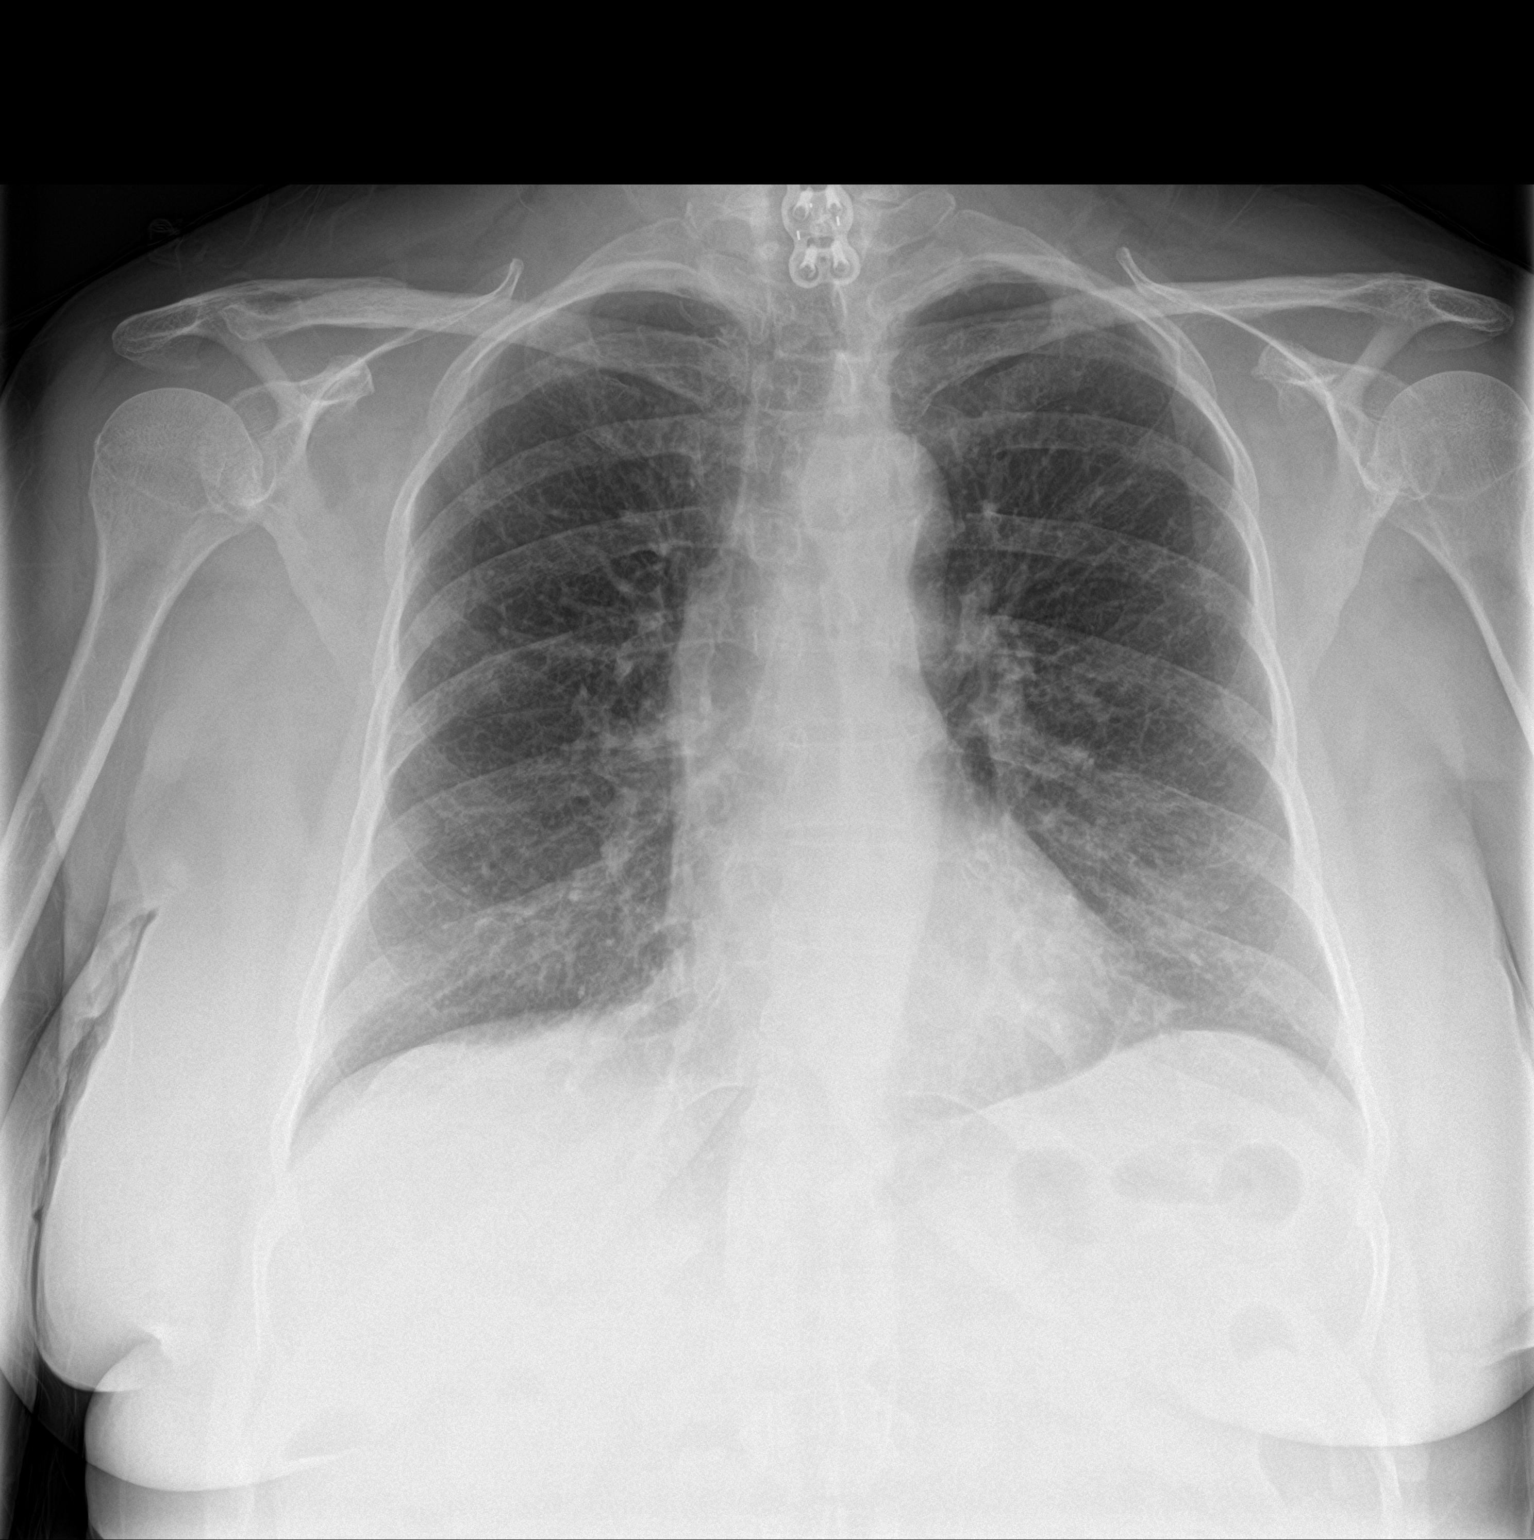

[chest lat]
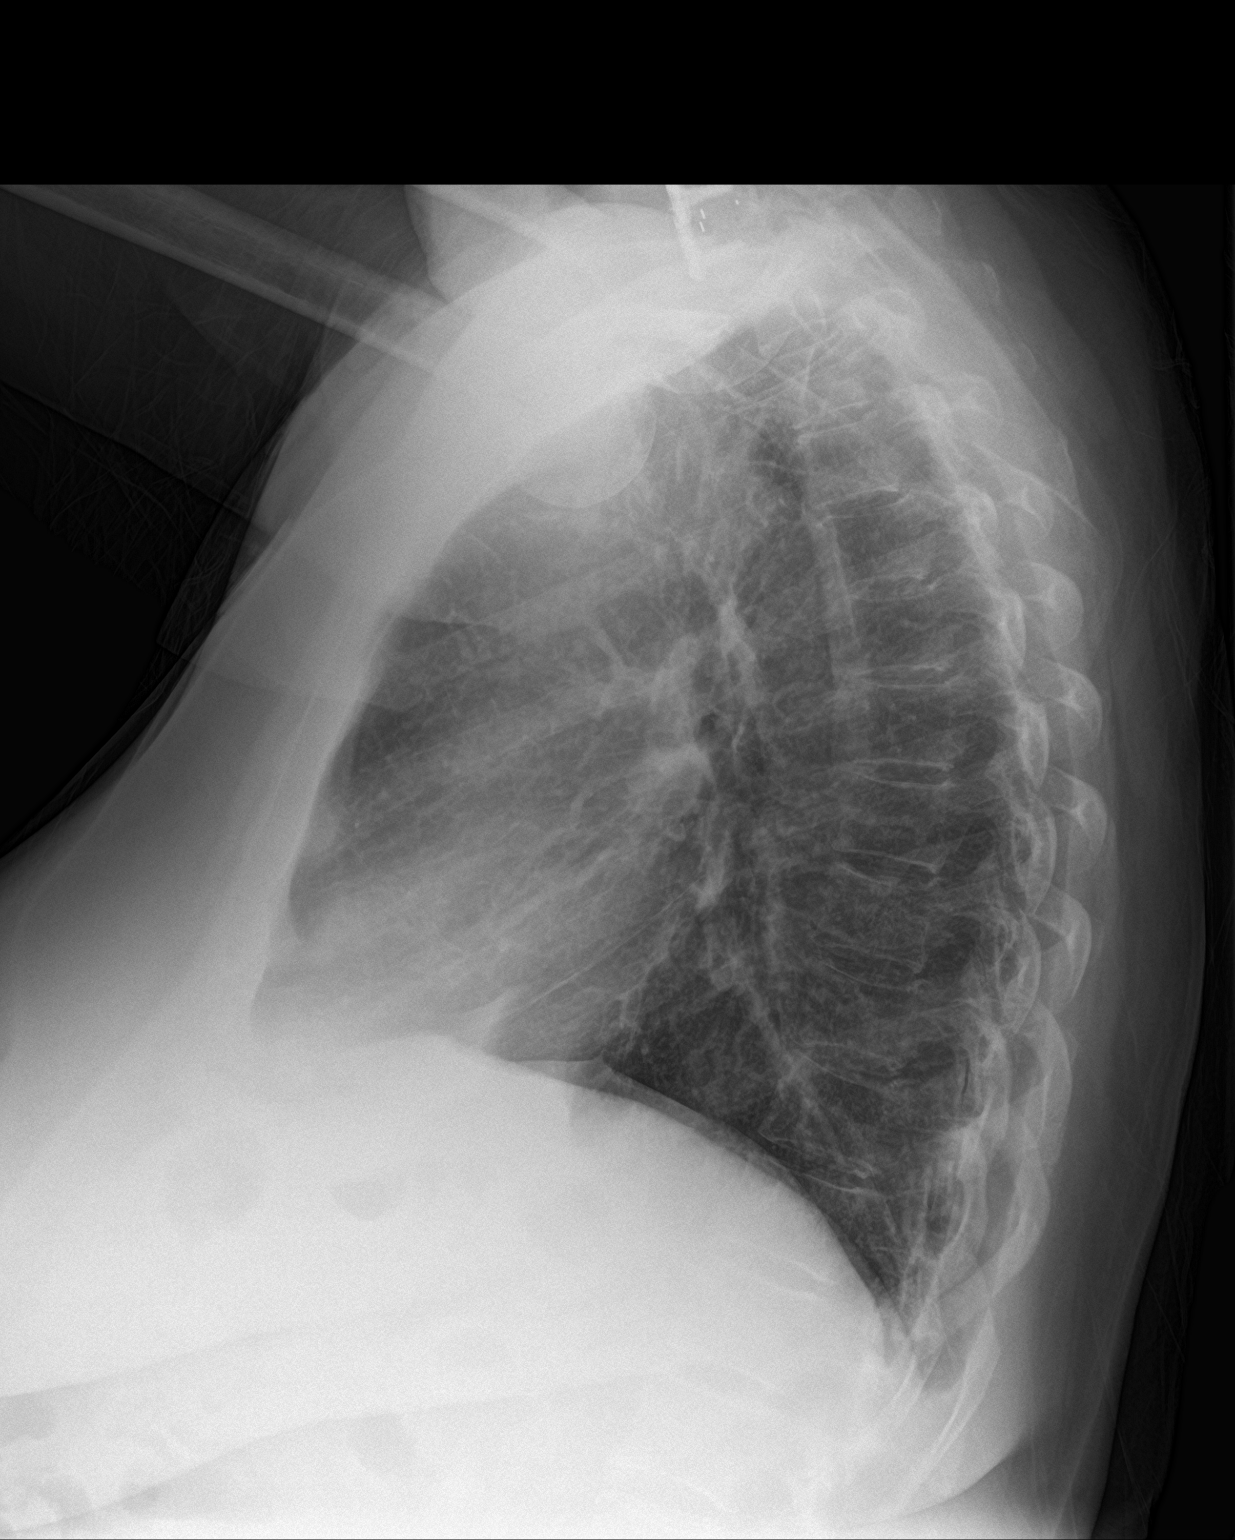

[2 of 2 positions shown; findings below may reference images not displayed]

FINDINGS: Surgical hardware in the cervical spine. No focal opacity or pleural
effusion. Normal cardiomediastinal silhouette. No pneumothorax.
IMPRESSION: No active cardiopulmonary disease.

## 2022-07-12 ENCOUNTER — Encounter: Payer: Self-pay | Admitting: Gastroenterology

## 2022-07-16 ENCOUNTER — Encounter: Payer: Self-pay | Admitting: Cardiology

## 2022-07-16 NOTE — H&P (Signed)
TOTAL KNEE ADMISSION H&P  Patient is being admitted for left total knee arthroplasty.  Subjective:  Chief Complaint: Left knee pain.  HPI: Shelley HAWN, 76 y.o. female has a history of pain and functional disability in the left knee due to arthritis and has failed non-surgical conservative treatments for greater than 12 weeks to include NSAID's and/or analgesics, corticosteriod injections, viscosupplementation injections, flexibility and strengthening excercises, and activity modification. Onset of symptoms was gradual, starting several years ago with gradually worsening course since that time. The patient noted no past surgery on the left knee.  Patient currently rates pain in the left knee at 7 out of 10 with activity. Patient has night pain, worsening of pain with activity and weight bearing, pain that interferes with activities of daily living, and pain with passive range of motion. Patient has evidence of  exposed bone in the lateral compartment both on the femur and the tibia. She has a posterior horn lateral meniscal tear, but there is significant arthritis lateral compartment and significant arthritis patellofemoral compartment  by imaging studies. There is no active infection.  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[place]) 11/20/2020   Stable angina 08/14/2020   CAD (coronary artery disease) 61/95/0932   Diastolic dysfunction 67/08/4579   Left-sided weakness 03/16/2020   CVA (cerebral vascular accident) ([place]) 03/15/2020   OA (osteoarthritis) of knee 05/17/2019   Herniated cervical disc without myelopathy 09/05/2017   Bronchitis 04/16/2017   Microcytosis 11/12/2013   Screening for  depression 11/12/2013   Obesity 10/29/2012   Underimmunization status 06/02/2012   Herpes zona 03/01/2012   LBP (low back pain) 02/27/2012   Atypical chest pain 10/31/2011   Overweight(278.02) 10/31/2011   HLD (hyperlipidemia)    Hypertension    Fatigue 10/30/2011   Laryngeal pain 09/05/2011   Benign neoplasm of colon 08/14/2011   Acute bronchitis 07/08/2011   Allergic rhinitis, seasonal 11/30/2009   Airway hyperreactivity 11/30/2009   DDD (degenerative disc disease) 11/30/2009   Clinical depression 11/30/2009   Benign essential HTN 11/30/2009   Acid reflux 11/30/2009   Other and unspecified general psychiatric examination 11/30/2009   Heart murmur, systolic 99/83/3825   Elevated fasting blood sugar 11/30/2009    Past Medical History:  Diagnosis Date   Anemia    Anxiety    Arthritis    Asthma    Bicornuate uterus    double cervix   CAD (coronary artery disease)    Colon polyp    Depression    Deviated septum    DJD (degenerative joint disease)    L-Spine   Fibroids    GERD (gastroesophageal reflux disease)    Hematuria    Hiatal hernia    HNP (herniated nucleus pulposus)    lower back   Hyperlipidemia    Hypertension    MVP (mitral valve prolapse)    pt denies, pt states she had a Echo ?2008 and it did not show MVP   Pre-diabetes    Retinal tear 2005   Small right retinal tear   Shingles 1980's   on her waist   Stroke (cerebrum) ([place]) 02/2020    Past Surgical History:  Procedure Laterality Date   ANTERIOR CERVICAL DECOMP/DISCECTOMY  FUSION N/A 09/05/2017   Procedure: Cervical four-five, Cervical five-six, Cervical six-seven Anterior cervical decompression/discectomy/fusion;  Surgeon: Erline Levine, MD;  Location: [facility];  Service: Neurosurgery;  Laterality: N/A;   bone spur surgery Bilateral    x 3   CARDIOVASCULAR STRESS TEST  02/08/2014   COLONOSCOPY     CYST EXCISION Left 09/29/2018   Procedure: LEFT MIDDLE FINGER CYST REMOVAL AND DEBRIDEMENT OF DISTAL  INTERPHALANGEAL JOINT;  Surgeon: Daryll Brod, MD;  Location: [facility];  Service: Orthopedics;  Laterality: Left;   FINGER SURGERY Left    left third finger mucoid cyst removed   FOOT NEUROMA SURGERY Bilateral    HYSTEROSCOPY     D&C PMB Endo Cx Polyp   KNEE ARTHROSCOPY WITH MENISCAL REPAIR Right 2016   --Flossie Dibble, MD   LAPAROSCOPIC HYSTERECTOMY  7/209/10   R-TLH/BSO  Fibroids, BAck Pain, Adenomyosis   LUMBAR FUSION     removal vaginal septum     TOTAL KNEE ARTHROPLASTY Right 05/17/2019   Procedure: TOTAL KNEE ARTHROPLASTY;  Surgeon: Gaynelle Arabian, MD;  Location: [facility];  Service: Orthopedics;  Laterality: Right;  18mn   TUBAL LIGATION      Prior to Admission medications   Medication Sig Start Date End Date Taking? Authorizing Provider  acetaminophen (TYLENOL) 650 MG CR tablet Take 1,300 mg by mouth every 8 (eight) hours as needed for pain.     [provider]  albuterol (VENTOLIN HFA) 108 (90 Base) MCG/ACT inhaler Inhale 1-2 puffs into the lungs every 6 (six) hours as needed for wheezing or shortness of breath.     [provider]  ALPRAZolam (Duanne Moron 0.25 MG tablet Take 0.25 mg by mouth 3 (three) times daily as needed. 12/21/20   [provider]  budesonide-formoterol (SYMBICORT) 160-4.5 MCG/ACT inhaler Inhale 2 puffs into the lungs daily.    [provider]  buPROPion (WELLBUTRIN XL) 150 MG 24 hr tablet Take 150 mg by mouth daily.    [provider]  CALCIUM-VITAMIN D PO Take 1 tablet by mouth daily.    [provider]  cetirizine (ZYRTEC) 10 MG tablet Take 10 mg by mouth daily.    [provider]  clopidogrel (PLAVIX) 75 MG tablet TAKE 1 TABLET BY MOUTH EVERY DAY 10/24/21   Lomax, Amy, NP  Dulaglutide (TRULICITY) 3 MXK/5.5VZSOPN Inject '3mg'$  into the skin once a week 04/17/22     furosemide (LASIX) 40 MG tablet Take 40 mg by mouth daily. 02/19/20   [provider]  gabapentin (NEURONTIN) 300 MG  capsule Take 1 capsule by mouth daily. 03/02/21   [provider]  isosorbide mononitrate (IMDUR) 60 MG 24 hr tablet Take 1 tablet (60 mg total) by mouth daily. 09/05/21   Cantwell, Celeste C, PA-C  magnesium gluconate (MAGONATE) 500 MG tablet Take 500 mg by mouth daily.    [provider]  metoprolol succinate (TOPROL-XL) 25 MG 24 hr tablet Take 1 tablet (25 mg total) by mouth daily. Take with or immediately following a meal. 03/05/22 07/03/22  Cantwell, Celeste C, PA-C  Multiple Vitamins-Minerals (MULTIVITAMIN PO) Take 1 tablet by mouth daily.    [provider]  nitroGLYCERIN (NITROSTAT) 0.4 MG SL tablet Place 1 tablet (0.4 mg total) under the tongue every 5 (five) minutes as needed for chest pain. 08/14/20   Patwardhan, MReynold Bowen MD  NON FORMULARY CPAP    [provider]  olmesartan (BENICAR) 20 MG tablet Take 2 tablets (40 mg total) by mouth  daily. 03/22/21   Gareth Morgan, MD  ondansetron (ZOFRAN) 4 MG tablet Take 1 tablet (4 mg total) by mouth every 6 (six) hours. 03/22/21   Gareth Morgan, MD  pantoprazole (PROTONIX) 40 MG tablet Take 1 tablet (40 mg total) by mouth daily. Patient taking differently: Take 40 mg by mouth 2 (two) times daily. 04/18/21   Levin Erp, PA  PARoxetine (PAXIL) 40 MG tablet Take 40 mg by mouth every evening.     [provider]  potassium chloride (KLOR-CON) 10 MEQ tablet Take 10 mEq by mouth daily.    [provider]  PRALUENT 150 MG/ML SOAJ INJECT 1 SYRINGE INTO THE SKIN EVERY 14 (FOURTEEN) DAYS. 12/03/21   Adrian Prows, MD  tirzepatide [facility]) 10 MG/0.5ML Pen Inject 1 pen (10 MG) into the skin once a week 06/05/22     traZODone (DESYREL) 150 MG tablet Take 150 mg by mouth at bedtime.    [provider]  vitamin B-12 (CYANOCOBALAMIN) 1000 MCG tablet Take 1,000 mcg by mouth daily.    [provider]    Allergies  Allergen Reactions   Amlodipine Other (See Comments) and Swelling     Legs swell some and she doesn't like it.   Fenofibrate Other (See Comments)    Leg pain   Omeprazole Other (See Comments)    Unknown   Simvastatin Other (See Comments)    Myalgia, leg pains   Singulair [Montelukast Sodium] Other (See Comments)    Affected mood   Statins Other (See Comments)    Myalgia, leg pains    Social History   Socioeconomic History   Marital status: Married    Spouse name: Not on file   Number of children: 2   Years of education: Not on file   Highest education level: Not on file  Occupational History   Occupation: [employer] teacher  Tobacco Use   Smoking status: Former    Packs/day: 1.00    Years: 20.00    Total pack years: 20.00    Types: Cigarettes    Quit date: 09/24/1979    Years since quitting: 42.8   Smokeless tobacco: Never  Vaping Use   Vaping Use: Never used  Substance and Sexual Activity   Alcohol use: Not Currently    Alcohol/week: 1.0 standard drink of alcohol    Types: 1 Standard drinks or equivalent per week   Drug use: No   Sexual activity: Yes    Partners: Male    Birth control/protection: Surgical    Comment: R-TLH/BSO  Other Topics Concern   Not on file  Social History Narrative   Husband with heart transplant.  Lives with husband and son.   Social Determinants of Health   Financial Resource Strain: Not on file  Food Insecurity: Not on file  Transportation Needs: Not on file  Physical Activity: Not on file  Stress: Not on file  Social Connections: Not on file  Intimate Partner Violence: Not on file    Tobacco Use: Medium Risk (05/20/2022)   Patient History    Smoking Tobacco Use: Former    Smokeless Tobacco Use: Never    Passive Exposure: Not on file   Social History   Substance and Sexual Activity  Alcohol Use Not Currently   Alcohol/week: 1.0 standard drink of alcohol   Types: 1 Standard drinks or equivalent per week    Family History  Problem Relation Age of Onset   Heart failure Father  Possibly  related to EtOH   Alcohol abuse Father    Emphysema Father    Emphysema Mother    Heart failure Mother        Possibly related to EtOH   Diabetes Mother    Alcohol abuse Mother    Valvular heart disease Brother    Bipolar disorder Brother    Crohn's disease Daughter    Thyroid disease Daughter    Colon cancer Neg Hx    Stomach cancer Neg Hx    Esophageal cancer Neg Hx    Rectal cancer Neg Hx    Liver cancer Neg Hx    Stroke Neg Hx     Review of Systems  Constitutional:  Negative for chills and fever.  HENT: Negative.    Eyes: Negative.   Respiratory:  Negative for cough and shortness of breath.   Cardiovascular:  Negative for chest pain and palpitations.  Gastrointestinal:  Negative for abdominal pain, constipation, diarrhea, nausea and vomiting.  Genitourinary:  Negative for dysuria, frequency and urgency.  Musculoskeletal:  Positive for joint pain.  Skin:  Negative for rash.   Objective:  Physical Exam: Well nourished and well developed.  General: Alert and oriented x3, cooperative and pleasant, no acute distress.  Head: normocephalic, atraumatic, neck supple.  Eyes: EOMI.  Abdomen: non-tender to palpation and soft, normoactive bowel sounds. Musculoskeletal: Left Knee Exam:  Trace effusion present. No swelling present.  The range of motion is: 5 to 125 degrees.  Positive crepitus on range of motion of the knee.  No medial joint line tenderness.  Positive lateral joint line tenderness.  The knee is stable.  Calves soft and nontender. Motor function intact in LE. Strength 5/5 LE bilaterally. Neuro: Distal pulses 2+. Sensation to light touch intact in LE.  Vital signs in last 24 hours: BP: ()/()  Arterial Line BP: ()/()   Imaging Review -Plain radiographs demonstrate moderate degenerative joint disease of the left knee. The overall alignment is neutral. The bone quality appears to be adequate for age and reported activity level. -MRI of the left knee  demonstrates exposed bone in the lateral compartment both on the femur and the tibia. She has a posterior horn lateral meniscal tear, but there is significant arthritis lateral compartment and significant arthritis patellofemoral compartment.  Assessment/Plan:  End stage arthritis, left knee   The patient history, physical examination, clinical judgment of the provider and imaging studies are consistent with end stage degenerative joint disease of the left knee and total knee arthroplasty is deemed medically necessary. The treatment options including medical management, injection therapy arthroscopy and arthroplasty were discussed at length. The risks and benefits of total knee arthroplasty were presented and reviewed. The risks due to aseptic loosening, infection, stiffness, patella tracking problems, thromboembolic complications and other imponderables were discussed. The patient acknowledged the explanation, agreed to proceed with the plan and consent was signed. Patient is being admitted for inpatient treatment for surgery, pain control, PT, OT, prophylactic antibiotics, VTE prophylaxis, progressive ambulation and ADLs and discharge planning. The patient is planning to be discharged  home .  Patient's anticipated LOS is less than 2 midnights, meeting these requirements: - Lives within 1 hour of care - Has a competent adult at home to recover with post-op - NO history of  - Chronic pain requiring opioids  - Diabetes  - Heart failure  - Heart attack  - DVT/VTE  - Cardiac arrhythmia  - Respiratory Failure/COPD  - Renal failure  - Anemia  -  Advanced Liver disease  Therapy Plans: EO Disposition: Home with Son Planned DVT Prophylaxis: Plavix + ASA 325 mg DME Needed: None PCP: Crist Infante, MD (clearance received) Cardiologist: Adrian Prows, MD (clearance received) TXA: IV Allergies: amlodipine (leg swelling), fenofibrate (leg pain), simvastatin (myalgias) Anesthesia Concerns: None BMI:  30.7 Last HgbA1c: not diabetic  Ph[facility]acy: [facility])  Other: -Hx of thrombotic stroke 2 years ago -Currently taking gabapentin 300 mg at night for hot flashes - discussed increasing to TID x 2 weeks then BID x 2 weeks then back to baseline.  - Patient was instructed on what medications to stop prior to surgery. - Follow-up visit in 2 weeks with Dr. Wynelle Link - Begin physical therapy following surgery - Pre-operative lab work as pre-surgical testing - Prescriptions will be provided in hospital at time of discharge  R. Jaynie Bream, PA-C Orthopedic Surgery [facility] [place]

## 2022-07-17 NOTE — Patient Instructions (Addendum)
SURGICAL WAITING ROOM VISITATION Patients having surgery or a procedure may have no more than 2 support people in the waiting area - these visitors may rotate in the visitor waiting room.   Children under the age of 72 must have an adult with them who is not the patient. If the patient needs to stay at the hospital during part of their recovery, the visitor guidelines for inpatient rooms apply.  PRE-OP VISITATION  Pre-op nurse will coordinate an appropriate time for 1 support person to accompany the patient in pre-op.  This support person may not rotate.  This visitor will be contacted when the time is appropriate for the visitor to come back in the pre-op area.  Please refer to the [facility] website for the visitor guidelines for Inpatients (after your surgery is over and you are in a regular room).  You are not required to quarantine at this time prior to your surgery. However, you must do this: Hand Hygiene often Do NOT share personal items Notify your provider if you are in close contact with someone who has COVID or you develop fever 100.4 or greater, new onset of sneezing, cough, sore throat, shortness of breath or body aches.  If you test positive for Covid or have been in contact with anyone that has tested positive in the last 10 days please notify you surgeon.    Your procedure is scheduled on:  Monday July 29, 2022  Report to [facility] Main Entrance: [place] entrance where the [employer] is available.   Report to admitting at:  05:15   AM  +++++Call this number if you have any questions or problems the morning of surgery [phone]  Do not eat food after Midnight the night prior to your surgery/procedure.  After Midnight you may have the following liquids until  04:15 AM  DAY OF SURGERY  Clear Liquid Diet Water Black Coffee (sugar ok, NO MILK/CREAM OR CREAMERS)  Tea (sugar ok, NO MILK/CREAM OR CREAMERS) regular and decaf                              Plain Jell-O  with no fruit (NO RED)                                           Fruit ices (not with fruit pulp, NO RED)                                     Popsicles (NO RED)                                                                  Juice: apple, WHITE grape, WHITE cranberry Sports drinks like Gatorade or Powerade (NO RED)                    The day of surgery:  Drink ONE (1) Pre-Surgery  G2 at   04:15  AM the morning of surgery. Drink in one sitting. Do not sip.  This drink  was given to you during your hospital pre-op appointment visit. Nothing else to drink after completing the Pre-Surgery G2 : No candy, chewing gum or throat lozenges.    FOLLOW  ANY ADDITIONAL PRE OP INSTRUCTIONS YOU RECEIVED FROM YOUR SURGEON'S OFFICE!!!   Oral Hygiene is also important to reduce your risk of infection.        Remember - BRUSH YOUR TEETH THE MORNING OF SURGERY WITH YOUR REGULAR TOOTHPASTE   Take ONLY these medicines the morning of surgery with A SIP OF WATER: Bupropion (Wellbutrin), You may use your Albuterol inhaler if needed.    If You have been diagnosed with Sleep Apnea - Bring CPAP mask and tubing day of surgery. We will provide you with a CPAP machine on the day of your surgery.                   You may not have any metal on your body including hair pins, jewelry, and body piercing  Do not wear make-up, lotions, powders, perfumes or deodorant  Do not wear nail polish including gel and S&S, artificial / acrylic nails, or any other type of covering on natural nails including finger and toenails. If you have artificial nails, gel coating, etc., that needs to be removed by a nail salon, Please have this removed prior to surgery. Not doing so may mean that your surgery could be cancelled or delayed if the Surgeon or anesthesia staff feels like they are unable to monitor you safely.   Do not shave 48 hours prior to surgery to avoid nicks in your skin which may contribute to  postoperative infections.   Contacts, Hearing Aids, dentures or bridgework may not be worn into surgery.   You may bring a small overnight bag with you on the day of surgery, only pack items that are not valuable . IS NOT RESPONSIBLE   FOR VALUABLES THAT ARE LOST OR STOLEN.   Do not bring your home medications to the hospital. The Pharmacy will dispense medications listed on your medication list to you during your admission in the Hospital.  Special Instructions: Bring a copy of your healthcare power of attorney and living will documents the day of surgery, if you wish to have them scanned into your [facility] Records- EPIC  Please read over the following fact sheets you were given: IF YOU HAVE QUESTIONS ABOUT YOUR PRE-OP INSTRUCTIONS, PLEASE CALL [phone]  ([place])   [place] - Preparing for Surgery Before surgery, you can play an important role.  Because skin is not sterile, your skin needs to be as free of germs as possible.  You can reduce the number of germs on your skin by washing with CHG (chlorahexidine gluconate) soap before surgery.  CHG is an antiseptic cleaner which kills germs and bonds with the skin to continue killing germs even after washing. Please DO NOT use if you have an allergy to CHG or antibacterial soaps.  If your skin becomes reddened/irritated stop using the CHG and inform your nurse when you arrive at Short Stay. Do not shave (including legs and underarms) for at least 48 hours prior to the first CHG shower.  You may shave your face/neck.  Please follow these instructions carefully:  1.  Shower with CHG Soap the night before surgery and the  morning of surgery.  2.  If you choose to wash your hair, wash your hair first as usual with your normal  shampoo.  3.  After you shampoo,  rinse your hair and body thoroughly to remove the shampoo.                             4.  Use CHG as you would any other liquid soap.  You can apply chg directly to the  skin and wash.  Gently with a scrungie or clean washcloth.  5.  Apply the CHG Soap to your body ONLY FROM THE NECK DOWN.   Do not use on face/ open                           Wound or open sores. Avoid contact with eyes, ears mouth and genitals (private parts).                       Wash face,  Genitals (private parts) with your normal soap.             6.  Wash thoroughly, paying special attention to the area where your  surgery  will be performed.  7.  Thoroughly rinse your body with warm water from the neck down.  8.  DO NOT shower/wash with your normal soap after using and rinsing off the CHG Soap.            9.  Pat yourself dry with a clean towel.            10.  Wear clean pajamas.            11.  Place clean sheets on your bed the night of your first shower and do not  sleep with pets.  ON THE DAY OF SURGERY : Do not apply any lotions/deodorants the morning of surgery.  Please wear clean clothes to the hospital/surgery center.    FAILURE TO FOLLOW THESE INSTRUCTIONS MAY RESULT IN THE CANCELLATION OF YOUR SURGERY  PATIENT SIGNATURE_________________________________  NURSE SIGNATURE__________________________________  ________________________________________________________________________           Adam Phenix    An incentive spirometer is a tool that can help keep your lungs clear and active. This tool measures how well you are filling your lungs with each breath. Taking long deep breaths may help reverse or decrease the chance of developing breathing (pulmonary) problems (especially infection) following: A long period of time when you are unable to move or be active. BEFORE THE PROCEDURE  If the spirometer includes an indicator to show your best effort, your nurse or respiratory therapist will set it to a desired goal. If possible, sit up straight or lean slightly forward. Try not to slouch. Hold the incentive spirometer in an upright position. INSTRUCTIONS FOR  USE  Sit on the edge of your bed if possible, or sit up as far as you can in bed or on a chair. Hold the incentive spirometer in an upright position. Breathe out normally. Place the mouthpiece in your mouth and seal your lips tightly around it. Breathe in slowly and as deeply as possible, raising the piston or the ball toward the top of the column. Hold your breath for 3-5 seconds or for as long as possible. Allow the piston or ball to fall to the bottom of the column. Remove the mouthpiece from your mouth and breathe out normally. Rest for a few seconds and repeat Steps 1 through 7 at least 10 times every 1-2 hours when you are awake. Take your time and  take a few normal breaths between deep breaths. The spirometer may include an indicator to show your best effort. Use the indicator as a goal to work toward during each repetition. After each set of 10 deep breaths, practice coughing to be sure your lungs are clear. If you have an incision (the cut made at the time of surgery), support your incision when coughing by placing a pillow or rolled up towels firmly against it. Once you are able to get out of bed, walk around indoors and cough well. You may stop using the incentive spirometer when instructed by your caregiver.  RISKS AND COMPLICATIONS Take your time so you do not get dizzy or light-headed. If you are in pain, you may need to take or ask for pain medication before doing incentive spirometry. It is harder to take a deep breath if you are having pain. AFTER USE Rest and breathe slowly and easily. It can be helpful to keep track of a log of your progress. Your caregiver can provide you with a simple table to help with this. If you are using the spirometer at home, follow these instructions: [place] IF:  You are having difficultly using the spirometer. You have trouble using the spirometer as often as instructed. Your pain medication is not giving enough relief while using the  spirometer. You develop fever of 100.5 F (38.1 C) or higher.                                                                                                    SEEK IMMEDIATE MEDICAL CARE IF:  You cough up bloody sputum that had not been present before. You develop fever of 102 F (38.9 C) or greater. You develop worsening pain at or near the incision site. MAKE SURE YOU:  Understand these instructions. Will watch your condition. Will get help right away if you are not doing well or get worse. Document Released: 01/20/2007 Document Revised: 12/02/2011 Document Reviewed: 03/23/2007 [facility] Patient Information 2014 [place], [place].

## 2022-07-17 NOTE — Progress Notes (Addendum)
COVID Vaccine received:  '[]'$  No '[x]'$  Yes Date of any COVID positive Test in last 90 days:  PCP - Crist Infante, MD Cardiologist - Adrian Prows, MD Clearance on chart Neurology- Larey Seat, MD  Chest x-ray -  EKG -  03-05-2022 Epic Stress Test - Lexiscan 2021 Epic ECHO - 10-08-2020 Epic Cardiac Cath -  CTA FFR Coronary - 03-30-2019  Pacemaker/ICD device     '[x]'$  N/A Spinal Cord Stimulator:'[x]'$  No '[]'$  Yes      (Remind patient to bring remote DOS) Other Implants:   History of Sleep Apnea? '[]'$  No '[x]'$  Yes   Sleep Study Date: 2021  CPAP used?- '[]'$  No '[x]'$  Yes  (Instruct to bring their mask & Tubing)   Requested a CPAP from Respiratory for DOS  Does the patient monitor blood sugar? '[x]'$  No '[]'$  Yes  '[]'$  N/A Fasting Blood Sugar Ranges-  Checks Blood Sugar ___0__ times a day  Blood Thinner Instructions: Plavix  Hold 5 days  according to patient.  Aspirin Instructions: none Last Dose:  ERAS Protocol Ordered: '[]'$  No  '[x]'$  Yes PRE-SURGERY '[]'$  ENSURE  '[x]'$  G2   Comments: Patient takes [facility] on Wednesdays, she will not take this on 07-24-22. She will resume it on Wednesday 07-31-22.  Activity level: Patient can slowly climb a flight of stairs  '[x]'$  No CP   but would have SOB_   Patient can perform ADLs without assistance.   Anesthesia review: CAD, Chronic Stable angina, HTN, Systolic murmur-aortic regurg. CKD3, Hx ACDF in 2018. Hx right pontine CVA.   Patient denies shortness of breath, fever, cough and chest pain at PAT appointment.  Patient verbalized understanding and agreement to the Pre-Surgical Instructions that were given to them at this PAT appointment. Patient was also educated of the need to review these PAT instructions again prior to his/her surgery.I reviewed the appropriate phone numbers to call if they have any and questions or concerns.

## 2022-07-19 ENCOUNTER — Encounter (HOSPITAL_COMMUNITY): Payer: Self-pay

## 2022-07-19 ENCOUNTER — Encounter (HOSPITAL_COMMUNITY)
Admission: RE | Admit: 2022-07-19 | Discharge: 2022-07-19 | Disposition: A | Discharge status: Home / Self Care | Payer: Medicare Other | Source: Ambulatory Visit | Attending: Orthopedic Surgery | Admitting: Orthopedic Surgery

## 2022-07-19 ENCOUNTER — Other Ambulatory Visit: Payer: Self-pay

## 2022-07-19 VITALS — BP 100/68 | HR 90 | Temp 98.4°F | Resp 16 | Ht 62.0 in | Wt 165.0 lb

## 2022-07-19 DIAGNOSIS — R7303 Prediabetes: Secondary | ICD-10-CM | POA: Insufficient documentation

## 2022-07-19 DIAGNOSIS — Z8673 Personal history of transient ischemic attack (TIA), and cerebral infarction without residual deficits: Secondary | ICD-10-CM | POA: Diagnosis not present

## 2022-07-19 DIAGNOSIS — I129 Hypertensive chronic kidney disease with stage 1 through stage 4 chronic kidney disease, or unspecified chronic kidney disease: Secondary | ICD-10-CM | POA: Diagnosis not present

## 2022-07-19 DIAGNOSIS — N183 Chronic kidney disease, stage 3 unspecified: Secondary | ICD-10-CM | POA: Insufficient documentation

## 2022-07-19 DIAGNOSIS — G4733 Obstructive sleep apnea (adult) (pediatric): Secondary | ICD-10-CM | POA: Diagnosis not present

## 2022-07-19 DIAGNOSIS — I10 Essential (primary) hypertension: Secondary | ICD-10-CM | POA: Insufficient documentation

## 2022-07-19 DIAGNOSIS — Z01818 Encounter for other preprocedural examination: Secondary | ICD-10-CM

## 2022-07-19 DIAGNOSIS — I2510 Atherosclerotic heart disease of native coronary artery without angina pectoris: Secondary | ICD-10-CM | POA: Diagnosis not present

## 2022-07-19 DIAGNOSIS — M1712 Unilateral primary osteoarthritis, left knee: Secondary | ICD-10-CM | POA: Diagnosis not present

## 2022-07-19 DIAGNOSIS — R011 Cardiac murmur, unspecified: Secondary | ICD-10-CM | POA: Insufficient documentation

## 2022-07-19 DIAGNOSIS — Z01812 Encounter for preprocedural laboratory examination: Secondary | ICD-10-CM | POA: Diagnosis present

## 2022-07-19 DIAGNOSIS — J45909 Unspecified asthma, uncomplicated: Secondary | ICD-10-CM | POA: Diagnosis not present

## 2022-07-19 HISTORY — DX: Personal history of other diseases of the digestive system: Z87.19

## 2022-07-19 HISTORY — DX: Sleep apnea, unspecified: G47.30

## 2022-07-19 LAB — CBC
HCT: 43.5 % (ref 36.0–46.0)
Hemoglobin: 13.9 g/dL (ref 12.0–15.0)
MCH: 26.9 pg (ref 26.0–34.0)
MCHC: 32 g/dL (ref 30.0–36.0)
MCV: 84.1 fL (ref 80.0–100.0)
Platelets: 299 10*3/uL (ref 150–400)
RBC: 5.17 MIL/uL — ABNORMAL HIGH (ref 3.87–5.11)
RDW: 14.3 % (ref 11.5–15.5)
WBC: 6.7 10*3/uL (ref 4.0–10.5)
nRBC: 0 % (ref 0.0–0.2)

## 2022-07-19 LAB — BASIC METABOLIC PANEL
Anion gap: 7 (ref 5–15)
BUN: 19 mg/dL (ref 8–23)
CO2: 29 mmol/L (ref 22–32)
Calcium: 9.4 mg/dL (ref 8.9–10.3)
Chloride: 106 mmol/L (ref 98–111)
Creatinine, Ser: 1.41 mg/dL — ABNORMAL HIGH (ref 0.44–1.00)
GFR, Estimated: 39 mL/min — ABNORMAL LOW (ref >60)
Glucose, Bld: 104 mg/dL — ABNORMAL HIGH (ref 70–99)
Potassium: 4.2 mmol/L (ref 3.5–5.1)
Sodium: 142 mmol/L (ref 135–145)

## 2022-07-19 LAB — HEMOGLOBIN A1C
Hgb A1c MFr Bld: 5.8 % — ABNORMAL HIGH (ref 4.8–5.6)
Mean Plasma Glucose: 119.76 mg/dL

## 2022-07-19 LAB — SURGICAL PCR SCREEN
MRSA, PCR: NEGATIVE
Staphylococcus aureus: NEGATIVE

## 2022-07-19 LAB — GLUCOSE, CAPILLARY: Glucose-Capillary: 102 mg/dL — ABNORMAL HIGH (ref 70–99)

## 2022-07-22 ENCOUNTER — Encounter (HOSPITAL_COMMUNITY): Payer: Self-pay

## 2022-07-22 NOTE — Progress Notes (Signed)
Anesthesia Chart Review:   Case: [id number] Date/Time: 07/29/22 0700   Procedure: TOTAL KNEE ARTHROPLASTY (Left: Knee)   Anesthesia type: Choice   Pre-op diagnosis: Left knee osteoarthritis   Location: [facility] ROOM 09 / [facility]   Surgeons: Gaynelle Arabian, MD       DISCUSSION: Pt is 76 years old with hx CAD (moderate by 2020 CT), CVA (pontine 2021), HTN, heart murmur (mild aortic regurgitation on 10/08/21 echo)), asthma, OSA, CKD stage 3  Pt takes Mounjaro weekly on Wednesdays. Will not take on 07/24/22  Pt to hold plavix 5 days prior to surgery   VS: BP 100/68 Comment: right arm sitting  Pulse 90   Temp 36.9 C (Oral)   Resp 16   Ht '5\' 2"'$  (1.575 m)   Wt 74.8 kg   LMP  (LMP Unknown)   SpO2 98%   BMI 30.18 kg/m   PROVIDERS: - PCP is Crist Infante, MD - Cardiologist is Adrian Prows, MD who cleared pt for surgery at low risk- see Epic Letters tab dated 07/16/22. Last office visit 03/05/22 with Lawerance Cruel, [place] - Neurologist is Larey Seat, MD who follows pt for sleep apnea   LABS: Labs reviewed: Acceptable for surgery. (all labs ordered are listed, but only abnormal results are displayed)  Labs Reviewed  HEMOGLOBIN A1C - Abnormal; Notable for the following components:      Result Value   Hgb A1c MFr Bld 5.8 (*)    All other components within normal limits  BASIC METABOLIC PANEL - Abnormal; Notable for the following components:   Glucose, Bld 104 (*)    Creatinine, Ser 1.41 (*)    GFR, Estimated 39 (*)    All other components within normal limits  CBC - Abnormal; Notable for the following components:   RBC 5.17 (*)    All other components within normal limits  GLUCOSE, CAPILLARY - Abnormal; Notable for the following components:   Glucose-Capillary 102 (*)    All other components within normal limits  SURGICAL PCR SCREEN     EKG 03/05/22: Sinus rhythm at a rate of 75 bpm.  Normal axis.  No evidence of ischemia or underlying injury pattern.   CV: Echo 10/08/21:  -  Normal LV systolic function with EF 58%. Left ventricle cavity is normal in size. Normal left ventricular wall thickness. Normal global wall motion. Doppler evidence of grade I (impaired) diastolic dysfunction, normal LAP. Calculated EF 58%. - Trileaflet aortic valve. No evidence of aortic stenosis. Mild (Grade I) aortic regurgitation. Mild aortic valve leaflet thickening. - Structurally normal mitral valve. No evidence of mitral stenosis. Mild (Grade I) mitral regurgitation. - No significant change from 04/23/2019.  Carotid duplex 08/07/21:  - No evidence of significant stenosis in the right internal carotid artery.  - Duplex suggests stenosis in the right external carotid artery (<50%).  - No evidence of significant stenosis in the left carotid vessels.  - Minimal homogeneous plaque noted in the carotid arteries.  - Right vertebral artery flow is undetectable (no flow). Left vertebral artery flow is not visualized.  - No significant change from 08/22/2020. Follow up in is appropriate if clinically indicated.  Exercise stress test 08/21/20:  - Normal ECG stress. The patient exercised for 5 minutes and 0 seconds of a Bruce protocol, achieving approximately 6.83 METs.  Normal blood pressure response.  Accelerated heart rate response suggest aerobic intolerance.  Stress terminated due to dyspnea and fatigue. - Myocardial perfusion: There is mild breast tissue attenuation inferiorly. There  is no ischemia or scar. - Overall LV systolic function is abnormal without regional wall motion abnormalities. Stress LV EF: 47%. Visually appears normal. The LV size was small and may be contributing to calculation of LVEF error.  - No previous exam available for comparison. Low risk.   Coronary CTA 03/30/2019:  1. Coronary calcium score of 33. This was 1 percentile for age and sex matched control. 2. Normal coronary origin with left dominance. 3. Moderate plaque with stenosis 50-69% in the mid LAD. CAD RADS 3.  Additional analysis with CT FFR will be submitted.   Coronary CTA FFR 03/30/2019:  1. Left Main: No significant stenosis. 2. LAD: No significant stenosis. 3. LCX: No significant stenosis. 4. RCA: No significant stenosis. IMPRESSION: 1.  CT FFR analysis didn't show any significant stenosis.    Past Medical History:  Diagnosis Date   Anemia    Anxiety    Arthritis    Asthma    Bicornuate uterus    double cervix   CAD (coronary artery disease)    Colon polyp    Depression    Deviated septum    DJD (degenerative joint disease)    L-Spine   Fibroids    GERD (gastroesophageal reflux disease)    Hematuria    History of hiatal hernia    HNP (herniated nucleus pulposus)    lower back   Hyperlipidemia    Hypertension    MVP (mitral valve prolapse)    pt denies, pt states she had a Echo ?2008 and it did not show MVP   Pre-diabetes    Retinal tear 2005   Small right retinal tear   Shingles 1980's   on her waist   Sleep apnea    Stroke (cerebrum) ([place]) 02/2020    Past Surgical History:  Procedure Laterality Date   ANTERIOR CERVICAL DECOMP/DISCECTOMY FUSION N/A 09/05/2017   Procedure: Cervical four-five, Cervical five-six, Cervical six-seven Anterior cervical decompression/discectomy/fusion;  Surgeon: Erline Levine, MD;  Location: [facility];  Service: Neurosurgery;  Laterality: N/A;   bone spur surgery Bilateral    x 3   CARDIOVASCULAR STRESS TEST  02/08/2014   COLONOSCOPY     CYST EXCISION Left 09/29/2018   Procedure: LEFT MIDDLE FINGER CYST REMOVAL AND DEBRIDEMENT OF DISTAL INTERPHALANGEAL JOINT;  Surgeon: Daryll Brod, MD;  Location: [facility];  Service: Orthopedics;  Laterality: Left;   FINGER SURGERY Left    left third finger mucoid cyst removed   FOOT NEUROMA SURGERY Bilateral    HYSTEROSCOPY     D&C PMB Endo Cx Polyp   KNEE ARTHROSCOPY WITH MENISCAL REPAIR Right 2016   --Flossie Dibble, MD   LAPAROSCOPIC HYSTERECTOMY  7/209/10   R-TLH/BSO  Fibroids, BAck  Pain, Adenomyosis   LUMBAR FUSION     removal vaginal septum     TOTAL KNEE ARTHROPLASTY Right 05/17/2019   Procedure: TOTAL KNEE ARTHROPLASTY;  Surgeon: Gaynelle Arabian, MD;  Location: [facility];  Service: Orthopedics;  Laterality: Right;  56mn   TUBAL LIGATION      MEDICATIONS:  acetaminophen (TYLENOL) 650 MG CR tablet   albuterol (VENTOLIN HFA) 108 (90 Base) MCG/ACT inhaler   ALPRAZolam (XANAX) 0.25 MG tablet   budesonide-formoterol (SYMBICORT) 160-4.5 MCG/ACT inhaler   buPROPion (WELLBUTRIN XL) 150 MG 24 hr tablet   CALCIUM-VITAMIN D PO   cetirizine (ZYRTEC) 10 MG tablet   clopidogrel (PLAVIX) 75 MG tablet   Dulaglutide (TRULICITY) 3 MOH/6.0VPSOPN   furosemide (LASIX) 20 MG tablet   gabapentin (  NEURONTIN) 300 MG capsule   isosorbide mononitrate (IMDUR) 60 MG 24 hr tablet   magnesium gluconate (MAGONATE) 500 MG tablet   metoprolol succinate (TOPROL-XL) 25 MG 24 hr tablet   Multiple Vitamins-Minerals (MULTIVITAMIN PO)   nitroGLYCERIN (NITROSTAT) 0.4 MG SL tablet   NON FORMULARY   olmesartan (BENICAR) 20 MG tablet   olmesartan (BENICAR) 40 MG tablet   ondansetron (ZOFRAN) 4 MG tablet   OXYGEN   pantoprazole (PROTONIX) 40 MG tablet   PARoxetine (PAXIL) 40 MG tablet   potassium chloride (KLOR-CON) 10 MEQ tablet   PRALUENT 150 MG/ML SOAJ   tirzepatide (MOUNJARO) 10 MG/0.5ML Pen   traZODone (DESYREL) 150 MG tablet   vitamin B-12 (CYANOCOBALAMIN) 1000 MCG tablet   No current facility-administered medications for this encounter.    If no changes, I anticipate pt can proceed with surgery as scheduled.   Willeen Cass, PhD, FNP-BC [facility]/Anesthesiology Phone: [phone] 07/22/2022 9:33 AM

## 2022-07-22 NOTE — Anesthesia Preprocedure Evaluation (Addendum)
Anesthesia Evaluation  Patient identified by MRN, date of birth, ID band Patient awake    Reviewed: Allergy & Precautions, NPO status , Patient's Chart, lab work & pertinent test results, reviewed documented beta blocker date and time   Airway Mallampati: I  TM Distance: >3 FB Neck ROM: Full    Dental no notable dental hx. (+) Caps, Dental Advisory Given, Teeth Intact   Pulmonary asthma , sleep apnea and Continuous Positive Airway Pressure Ventilation , former smoker   Pulmonary exam normal breath sounds clear to auscultation       Cardiovascular hypertension, Pt. on medications + angina  + CAD  Normal cardiovascular exam+ Valvular Problems/Murmurs  Rhythm:Regular Rate:Normal  EKG 6/132/23 NSR, LAE, low voltage anterior leads  ECHO 03/15/20 1. Technically difficult echo with poor image quality.   2. Left ventricular ejection fraction, by estimation, is 65 to 70%. The  left ventricle has normal function. The left ventricle has no regional  wall motion abnormalities. Left ventricular diastolic parameters are  consistent with Grade I diastolic dysfunction (impaired relaxation).   3. Right ventricular systolic function is normal. The right ventricular  size is normal.   4. The mitral valve is grossly normal. No evidence of mitral valve  regurgitation.   5. The aortic valve is grossly normal. Aortic valve regurgitation is not  visualized. No aortic stenosis is present.   6. The inferior vena cava is normal in size with greater than 50%  respiratory variability, suggesting right atrial pressure of 3 mmHg.     Neuro/Psych  PSYCHIATRIC DISORDERS Anxiety Depression    Right pontine stroke 08/13/20 with residual left sided weakness CVA, Residual Symptoms    GI/Hepatic Neg liver ROS, hiatal hernia,GERD  Medicated,,  Endo/Other  Hyperlipidemia GLP-1 agonist therapy  Renal/GU Renal InsufficiencyRenal disease      Musculoskeletal  (+) Arthritis ,  OA left knee HNP cervical spine DDD lumbar spine   Abdominal  (+) + obese  Peds  Hematology  (+) Blood dyscrasia, anemia Plavix therapy- last dose   Anesthesia Other Findings   Reproductive/Obstetrics                              Anesthesia Physical Anesthesia Plan  ASA: 3  Anesthesia Plan: Spinal   Post-op Pain Management: Regional block* and Minimal or no pain anticipated   Induction: Intravenous  PONV Risk Score and Plan: 3 and Propofol infusion, Treatment may vary due to age or medical condition, Midazolam and Ondansetron  Airway Management Planned: Natural Airway, Simple Face Mask and Nasal Cannula  Additional Equipment: None  Intra-op Plan:   Post-operative Plan:   Informed Consent: I have reviewed the patients History and Physical, chart, labs and discussed the procedure including the risks, benefits and alternatives for the proposed anesthesia with the patient or authorized representative who has indicated his/her understanding and acceptance.     Dental advisory given  Plan Discussed with: Anesthesiologist and CRNA  Anesthesia Plan Comments: (See APP note by Durel Salts, FNP )         Anesthesia Quick Evaluation

## 2022-07-27 ENCOUNTER — Encounter (HOSPITAL_COMMUNITY): Payer: Self-pay | Admitting: Orthopedic Surgery

## 2022-07-29 ENCOUNTER — Ambulatory Visit (HOSPITAL_BASED_OUTPATIENT_CLINIC_OR_DEPARTMENT_OTHER): Payer: Medicare Other | Admitting: Anesthesiology

## 2022-07-29 ENCOUNTER — Encounter (HOSPITAL_COMMUNITY): Payer: Self-pay | Admitting: Orthopedic Surgery

## 2022-07-29 ENCOUNTER — Other Ambulatory Visit: Payer: Self-pay

## 2022-07-29 ENCOUNTER — Observation Stay (HOSPITAL_COMMUNITY)
Admission: RE | Admit: 2022-07-29 | Discharge: 2022-07-30 | Disposition: A | Discharge status: Home / Self Care | Payer: Medicare Other | Attending: Orthopedic Surgery | Admitting: Orthopedic Surgery

## 2022-07-29 ENCOUNTER — Ambulatory Visit (HOSPITAL_COMMUNITY): Payer: Medicare Other | Admitting: Emergency Medicine

## 2022-07-29 ENCOUNTER — Encounter (HOSPITAL_COMMUNITY)
Admission: RE | Disposition: A | Discharge status: Home / Self Care | Payer: Self-pay | Source: Home / Self Care | Attending: Orthopedic Surgery

## 2022-07-29 DIAGNOSIS — J45909 Unspecified asthma, uncomplicated: Secondary | ICD-10-CM | POA: Insufficient documentation

## 2022-07-29 DIAGNOSIS — Z79899 Other long term (current) drug therapy: Secondary | ICD-10-CM | POA: Diagnosis not present

## 2022-07-29 DIAGNOSIS — I25119 Atherosclerotic heart disease of native coronary artery with unspecified angina pectoris: Secondary | ICD-10-CM | POA: Diagnosis not present

## 2022-07-29 DIAGNOSIS — Z87891 Personal history of nicotine dependence: Secondary | ICD-10-CM | POA: Insufficient documentation

## 2022-07-29 DIAGNOSIS — Z7902 Long term (current) use of antithrombotics/antiplatelets: Secondary | ICD-10-CM | POA: Diagnosis not present

## 2022-07-29 DIAGNOSIS — I2510 Atherosclerotic heart disease of native coronary artery without angina pectoris: Secondary | ICD-10-CM | POA: Insufficient documentation

## 2022-07-29 DIAGNOSIS — M1712 Unilateral primary osteoarthritis, left knee: Principal | ICD-10-CM | POA: Insufficient documentation

## 2022-07-29 DIAGNOSIS — M179 Osteoarthritis of knee, unspecified: Secondary | ICD-10-CM | POA: Diagnosis present

## 2022-07-29 DIAGNOSIS — Z7985 Long-term (current) use of injectable non-insulin antidiabetic drugs: Secondary | ICD-10-CM | POA: Insufficient documentation

## 2022-07-29 DIAGNOSIS — Z8673 Personal history of transient ischemic attack (TIA), and cerebral infarction without residual deficits: Secondary | ICD-10-CM | POA: Insufficient documentation

## 2022-07-29 DIAGNOSIS — I10 Essential (primary) hypertension: Secondary | ICD-10-CM | POA: Diagnosis not present

## 2022-07-29 DIAGNOSIS — Z96651 Presence of right artificial knee joint: Secondary | ICD-10-CM | POA: Insufficient documentation

## 2022-07-29 DIAGNOSIS — R7303 Prediabetes: Secondary | ICD-10-CM

## 2022-07-29 DIAGNOSIS — M1710 Unilateral primary osteoarthritis, unspecified knee: Secondary | ICD-10-CM | POA: Diagnosis present

## 2022-07-29 HISTORY — PX: TOTAL KNEE ARTHROPLASTY: SHX125

## 2022-07-29 LAB — GLUCOSE, CAPILLARY: Glucose-Capillary: 100 mg/dL — ABNORMAL HIGH (ref 70–99)

## 2022-07-29 SURGERY — ARTHROPLASTY, KNEE, TOTAL
Anesthesia: Spinal | Site: Knee | Laterality: Left

## 2022-07-29 MED ORDER — PHENOL 1.4 % MT LIQD: 1.0000 | OROMUCOSAL | Status: DC | PRN

## 2022-07-29 MED ORDER — ONDANSETRON HCL 4 MG/2ML IJ SOLN: 4.0000 mg | Freq: Four times a day (QID) | INTRAMUSCULAR | Status: DC | PRN

## 2022-07-29 MED ORDER — GABAPENTIN 300 MG PO CAPS
300.0000 mg | ORAL_CAPSULE | Freq: Every day | ORAL | Status: DC
  Administered 2022-07-29: 300 mg via ORAL
  Filled 2022-07-29: qty 1

## 2022-07-29 MED ORDER — NITROGLYCERIN 0.4 MG SL SUBL: 0.4000 mg | SUBLINGUAL_TABLET | SUBLINGUAL | Status: DC | PRN

## 2022-07-29 MED ORDER — BUPROPION HCL ER (XL) 150 MG PO TB24
150.0000 mg | ORAL_TABLET | Freq: Every day | ORAL | Status: DC
  Administered 2022-07-30: 150 mg via ORAL
  Filled 2022-07-29: qty 1

## 2022-07-29 MED ORDER — TRANEXAMIC ACID-NACL 1000-0.7 MG/100ML-% IV SOLN
1000.0000 mg | INTRAVENOUS | Status: DC
  Filled 2022-07-29: qty 100

## 2022-07-29 MED ORDER — ALPRAZOLAM 0.25 MG PO TABS
0.2500 mg | ORAL_TABLET | Freq: Every day | ORAL | Status: DC | PRN
  Administered 2022-07-30: 0.25 mg via ORAL
  Filled 2022-07-29: qty 1

## 2022-07-29 MED ORDER — FENTANYL CITRATE (PF) 100 MCG/2ML IJ SOLN
INTRAMUSCULAR | Status: DC | PRN
  Administered 2022-07-29: 100 ug via INTRAVENOUS

## 2022-07-29 MED ORDER — TRAZODONE HCL 50 MG PO TABS
150.0000 mg | ORAL_TABLET | Freq: Every day | ORAL | Status: DC
  Administered 2022-07-29: 150 mg via ORAL
  Filled 2022-07-29: qty 1

## 2022-07-29 MED ORDER — ISOSORBIDE MONONITRATE ER 60 MG PO TB24
60.0000 mg | ORAL_TABLET | Freq: Every day | ORAL | Status: DC
  Administered 2022-07-30: 60 mg via ORAL
  Filled 2022-07-29: qty 1

## 2022-07-29 MED ORDER — POLYETHYLENE GLYCOL 3350 17 G PO PACK: 17.0000 g | PACK | Freq: Every day | ORAL | Status: DC | PRN

## 2022-07-29 MED ORDER — MENTHOL 3 MG MT LOZG: 1.0000 | LOZENGE | OROMUCOSAL | Status: DC | PRN

## 2022-07-29 MED ORDER — CHLORHEXIDINE GLUCONATE 0.12 % MT SOLN
15.0000 mL | Freq: Once | OROMUCOSAL | Status: AC
  Administered 2022-07-29: 15 mL via OROMUCOSAL

## 2022-07-29 MED ORDER — POTASSIUM CHLORIDE ER 10 MEQ PO TBCR
10.0000 meq | EXTENDED_RELEASE_TABLET | Freq: Every day | ORAL | Status: DC
  Administered 2022-07-29 – 2022-07-30 (×2): 10 meq via ORAL
  Filled 2022-07-29 (×4): qty 1

## 2022-07-29 MED ORDER — PROPOFOL 500 MG/50ML IV EMUL
INTRAVENOUS | Status: DC | PRN
  Administered 2022-07-29: 75 ug/kg/min via INTRAVENOUS
  Administered 2022-07-29: 30 mg via INTRAVENOUS

## 2022-07-29 MED ORDER — BISACODYL 10 MG RE SUPP: 10.0000 mg | Freq: Every day | RECTAL | Status: DC | PRN

## 2022-07-29 MED ORDER — SODIUM CHLORIDE (PF) 0.9 % IJ SOLN
INTRAMUSCULAR | Status: AC
  Filled 2022-07-29: qty 50

## 2022-07-29 MED ORDER — FLEET ENEMA 7-19 GM/118ML RE ENEM: 1.0000 | ENEMA | Freq: Once | RECTAL | Status: DC | PRN

## 2022-07-29 MED ORDER — ROPIVACAINE HCL 5 MG/ML IJ SOLN
INTRAMUSCULAR | Status: DC | PRN
  Administered 2022-07-29: 30 mL via PERINEURAL

## 2022-07-29 MED ORDER — PAROXETINE HCL 10 MG PO TABS
40.0000 mg | ORAL_TABLET | Freq: Every evening | ORAL | Status: DC
  Administered 2022-07-29: 40 mg via ORAL
  Filled 2022-07-29: qty 4

## 2022-07-29 MED ORDER — METOCLOPRAMIDE HCL 5 MG/ML IJ SOLN: 5.0000 mg | Freq: Three times a day (TID) | INTRAMUSCULAR | Status: DC | PRN

## 2022-07-29 MED ORDER — METHOCARBAMOL 500 MG PO TABS
500.0000 mg | ORAL_TABLET | Freq: Four times a day (QID) | ORAL | Status: DC | PRN
  Administered 2022-07-29 – 2022-07-30 (×4): 500 mg via ORAL
  Filled 2022-07-29 (×4): qty 1

## 2022-07-29 MED ORDER — DEXAMETHASONE SODIUM PHOSPHATE 10 MG/ML IJ SOLN
8.0000 mg | Freq: Once | INTRAMUSCULAR | Status: AC
  Administered 2022-07-29: 10 mg via INTRAVENOUS

## 2022-07-29 MED ORDER — ACETAMINOPHEN 10 MG/ML IV SOLN
1000.0000 mg | Freq: Four times a day (QID) | INTRAVENOUS | Status: DC
  Administered 2022-07-29: 1000 mg via INTRAVENOUS
  Filled 2022-07-29: qty 100

## 2022-07-29 MED ORDER — POVIDONE-IODINE 10 % EX SWAB
2.0000 | Freq: Once | CUTANEOUS | Status: AC
  Administered 2022-07-29: 2 via TOPICAL

## 2022-07-29 MED ORDER — LACTATED RINGERS IV SOLN: INTRAVENOUS | Status: DC

## 2022-07-29 MED ORDER — BUPIVACAINE LIPOSOME 1.3 % IJ SUSP: 20.0000 mL | Freq: Once | INTRAMUSCULAR | Status: DC

## 2022-07-29 MED ORDER — SODIUM CHLORIDE 0.9 % IV SOLN: INTRAVENOUS | Status: DC

## 2022-07-29 MED ORDER — BUPIVACAINE IN DEXTROSE 0.75-8.25 % IT SOLN
INTRATHECAL | Status: DC | PRN
  Administered 2022-07-29: 1.6 mL via INTRATHECAL

## 2022-07-29 MED ORDER — METOPROLOL SUCCINATE ER 25 MG PO TB24
25.0000 mg | ORAL_TABLET | Freq: Every day | ORAL | Status: DC
  Administered 2022-07-29 – 2022-07-30 (×2): 25 mg via ORAL
  Filled 2022-07-29 (×2): qty 1

## 2022-07-29 MED ORDER — OXYCODONE HCL 5 MG PO TABS: 5.0000 mg | ORAL_TABLET | ORAL | Status: DC | PRN

## 2022-07-29 MED ORDER — MORPHINE SULFATE (PF) 2 MG/ML IV SOLN: 1.0000 mg | INTRAVENOUS | Status: DC | PRN

## 2022-07-29 MED ORDER — OXYCODONE HCL 5 MG PO TABS
10.0000 mg | ORAL_TABLET | ORAL | Status: DC | PRN
  Administered 2022-07-29 – 2022-07-30 (×5): 10 mg via ORAL
  Filled 2022-07-29 (×5): qty 2

## 2022-07-29 MED ORDER — SODIUM CHLORIDE 0.9 % IR SOLN
Status: DC | PRN
  Administered 2022-07-29: 1000 mL

## 2022-07-29 MED ORDER — BUPIVACAINE LIPOSOME 1.3 % IJ SUSP
INTRAMUSCULAR | Status: DC | PRN
  Administered 2022-07-29: 20 mL

## 2022-07-29 MED ORDER — LORATADINE 10 MG PO TABS
10.0000 mg | ORAL_TABLET | Freq: Every day | ORAL | Status: DC
  Administered 2022-07-29 – 2022-07-30 (×2): 10 mg via ORAL
  Filled 2022-07-29 (×2): qty 1

## 2022-07-29 MED ORDER — ORAL CARE MOUTH RINSE: 15.0000 mL | Freq: Once | OROMUCOSAL | Status: AC

## 2022-07-29 MED ORDER — METOCLOPRAMIDE HCL 5 MG PO TABS
5.0000 mg | ORAL_TABLET | Freq: Three times a day (TID) | ORAL | Status: DC | PRN
  Filled 2022-07-29: qty 2

## 2022-07-29 MED ORDER — METHOCARBAMOL 500 MG IVPB - SIMPLE MED: 500.0000 mg | Freq: Four times a day (QID) | INTRAVENOUS | Status: DC | PRN

## 2022-07-29 MED ORDER — SODIUM CHLORIDE (PF) 0.9 % IJ SOLN
INTRAMUSCULAR | Status: DC | PRN
  Administered 2022-07-29: 60 mL via INTRAVENOUS

## 2022-07-29 MED ORDER — PANTOPRAZOLE SODIUM 40 MG PO TBEC
40.0000 mg | DELAYED_RELEASE_TABLET | Freq: Every day | ORAL | Status: DC
  Administered 2022-07-29 – 2022-07-30 (×2): 40 mg via ORAL
  Filled 2022-07-29 (×2): qty 1

## 2022-07-29 MED ORDER — DOCUSATE SODIUM 100 MG PO CAPS
100.0000 mg | ORAL_CAPSULE | Freq: Two times a day (BID) | ORAL | Status: DC
  Administered 2022-07-29 – 2022-07-30 (×2): 100 mg via ORAL
  Filled 2022-07-29 (×2): qty 1

## 2022-07-29 MED ORDER — ASPIRIN 325 MG PO TBEC
325.0000 mg | DELAYED_RELEASE_TABLET | Freq: Every day | ORAL | Status: DC
  Administered 2022-07-30: 325 mg via ORAL
  Filled 2022-07-29: qty 1

## 2022-07-29 MED ORDER — ONDANSETRON HCL 4 MG/2ML IJ SOLN
INTRAMUSCULAR | Status: DC | PRN
  Administered 2022-07-29: 4 mg via INTRAVENOUS

## 2022-07-29 MED ORDER — DIPHENHYDRAMINE HCL 12.5 MG/5ML PO ELIX: 12.5000 mg | ORAL_SOLUTION | ORAL | Status: DC | PRN

## 2022-07-29 MED ORDER — ACETAMINOPHEN 500 MG PO TABS
1000.0000 mg | ORAL_TABLET | Freq: Four times a day (QID) | ORAL | Status: AC
  Administered 2022-07-29 – 2022-07-30 (×4): 1000 mg via ORAL
  Filled 2022-07-29 (×4): qty 2

## 2022-07-29 MED ORDER — MOMETASONE FURO-FORMOTEROL FUM 200-5 MCG/ACT IN AERO
2.0000 | INHALATION_SPRAY | Freq: Two times a day (BID) | RESPIRATORY_TRACT | Status: DC
  Administered 2022-07-29 – 2022-07-30 (×2): 2 via RESPIRATORY_TRACT
  Filled 2022-07-29: qty 8.8

## 2022-07-29 MED ORDER — DEXAMETHASONE SODIUM PHOSPHATE 10 MG/ML IJ SOLN
10.0000 mg | Freq: Once | INTRAMUSCULAR | Status: AC
  Administered 2022-07-30: 10 mg via INTRAVENOUS
  Filled 2022-07-29: qty 1

## 2022-07-29 MED ORDER — CEFAZOLIN SODIUM-DEXTROSE 2-4 GM/100ML-% IV SOLN
2.0000 g | INTRAVENOUS | Status: AC
  Administered 2022-07-29: 2 g via INTRAVENOUS
  Filled 2022-07-29: qty 100

## 2022-07-29 MED ORDER — SODIUM CHLORIDE (PF) 0.9 % IJ SOLN
INTRAMUSCULAR | Status: AC
  Filled 2022-07-29: qty 10

## 2022-07-29 MED ORDER — IRBESARTAN 150 MG PO TABS
300.0000 mg | ORAL_TABLET | Freq: Every day | ORAL | Status: DC
  Administered 2022-07-30: 300 mg via ORAL
  Filled 2022-07-29: qty 2

## 2022-07-29 MED ORDER — CEFAZOLIN SODIUM-DEXTROSE 2-4 GM/100ML-% IV SOLN
2.0000 g | Freq: Four times a day (QID) | INTRAVENOUS | Status: AC
  Administered 2022-07-29 (×2): 2 g via INTRAVENOUS
  Filled 2022-07-29 (×2): qty 100

## 2022-07-29 MED ORDER — BUPIVACAINE LIPOSOME 1.3 % IJ SUSP
INTRAMUSCULAR | Status: AC
  Filled 2022-07-29: qty 20

## 2022-07-29 MED ORDER — ONDANSETRON HCL 4 MG PO TABS: 4.0000 mg | ORAL_TABLET | Freq: Four times a day (QID) | ORAL | Status: DC | PRN

## 2022-07-29 MED ORDER — ALBUTEROL SULFATE HFA 108 (90 BASE) MCG/ACT IN AERS: 1.0000 | INHALATION_SPRAY | Freq: Four times a day (QID) | RESPIRATORY_TRACT | Status: DC | PRN

## 2022-07-29 MED ORDER — PHENYLEPHRINE HCL (PRESSORS) 10 MG/ML IV SOLN
INTRAVENOUS | Status: DC | PRN
  Administered 2022-07-29 (×2): 160 ug via INTRAVENOUS

## 2022-07-29 MED ORDER — FENTANYL CITRATE (PF) 100 MCG/2ML IJ SOLN
INTRAMUSCULAR | Status: AC
  Filled 2022-07-29: qty 2

## 2022-07-29 MED ORDER — FUROSEMIDE 20 MG PO TABS
20.0000 mg | ORAL_TABLET | Freq: Every day | ORAL | Status: DC
  Administered 2022-07-30: 20 mg via ORAL
  Filled 2022-07-29: qty 1

## 2022-07-29 MED ORDER — CLOPIDOGREL BISULFATE 75 MG PO TABS
75.0000 mg | ORAL_TABLET | Freq: Every day | ORAL | Status: DC
  Administered 2022-07-30: 75 mg via ORAL
  Filled 2022-07-29: qty 1

## 2022-07-29 SURGICAL SUPPLY — 57 items
ATTUNE PS FEM LT SZ 5 CEM KNEE (Femur) IMPLANT
ATTUNE PSRP INSR SZ 5 10M KNEE (Insert) IMPLANT
BAG COUNTER SPONGE SURGICOUNT (BAG) IMPLANT
BAG SPEC THK2 15X12 ZIP CLS (MISCELLANEOUS) ×1
BAG SPNG CNTER NS LX DISP (BAG)
BAG ZIPLOCK 12X15 (MISCELLANEOUS) ×2 IMPLANT
BASE TIBIAL ROT PLAT SZ 5 KNEE (Knees) IMPLANT
BLADE SAG 18X100X1.27 (BLADE) ×2 IMPLANT
BLADE SAW SGTL 11.0X1.19X90.0M (BLADE) ×2 IMPLANT
BNDG ELASTIC 6X5.8 VLCR STR LF (GAUZE/BANDAGES/DRESSINGS) ×2 IMPLANT
BOWL SMART MIX CTS (DISPOSABLE) ×2 IMPLANT
BSPLAT TIB 5 CMNT ROT PLAT STR (Knees) ×1 IMPLANT
CEMENT HV SMART SET (Cement) ×4 IMPLANT
COVER SURGICAL LIGHT HANDLE (MISCELLANEOUS) ×2 IMPLANT
CUFF TOURN SGL QUICK 34 (TOURNIQUET CUFF) ×1
CUFF TRNQT CYL 34X4.125X (TOURNIQUET CUFF) ×2 IMPLANT
DRAPE INCISE IOBAN 66X45 STRL (DRAPES) ×2 IMPLANT
DRAPE U-SHAPE 47X51 STRL (DRAPES) ×2 IMPLANT
DRSG AQUACEL AG ADV 3.5X10 (GAUZE/BANDAGES/DRESSINGS) ×2 IMPLANT
DURAPREP 26ML APPLICATOR (WOUND CARE) ×2 IMPLANT
ELECT REM PT RETURN 15FT ADLT (MISCELLANEOUS) ×2 IMPLANT
GLOVE BIO SURGEON STRL SZ 6.5 (GLOVE) IMPLANT
GLOVE BIO SURGEON STRL SZ7.5 (GLOVE) IMPLANT
GLOVE BIO SURGEON STRL SZ8 (GLOVE) ×2 IMPLANT
GLOVE BIOGEL PI IND STRL 6.5 (GLOVE) IMPLANT
GLOVE BIOGEL PI IND STRL 7.0 (GLOVE) IMPLANT
GLOVE BIOGEL PI IND STRL 8 (GLOVE) ×2 IMPLANT
GOWN STRL REUS W/ TWL LRG LVL3 (GOWN DISPOSABLE) ×2 IMPLANT
GOWN STRL REUS W/ TWL XL LVL3 (GOWN DISPOSABLE) IMPLANT
GOWN STRL REUS W/TWL LRG LVL3 (GOWN DISPOSABLE) ×1
GOWN STRL REUS W/TWL XL LVL3 (GOWN DISPOSABLE) ×3
HANDPIECE INTERPULSE COAX TIP (DISPOSABLE) ×1
HOLDER FOLEY CATH W/STRAP (MISCELLANEOUS) IMPLANT
IMMOBILIZER KNEE 20 (SOFTGOODS) ×1
IMMOBILIZER KNEE 20 THIGH 36 (SOFTGOODS) ×2 IMPLANT
IMMOBILIZER KNEE 22 UNIV (SOFTGOODS) IMPLANT
KIT TURNOVER KIT A (KITS) IMPLANT
MANIFOLD NEPTUNE II (INSTRUMENTS) ×2 IMPLANT
NS IRRIG 1000ML POUR BTL (IV SOLUTION) ×2 IMPLANT
PACK TOTAL KNEE CUSTOM (KITS) ×2 IMPLANT
PADDING CAST COTTON 6X4 STRL (CAST SUPPLIES) ×4 IMPLANT
PATELLA MEDIAL ATTUN 35MM KNEE (Knees) IMPLANT
PROTECTOR NERVE ULNAR (MISCELLANEOUS) ×2 IMPLANT
SET HNDPC FAN SPRY TIP SCT (DISPOSABLE) ×2 IMPLANT
SPIKE FLUID TRANSFER (MISCELLANEOUS) ×2 IMPLANT
STRIP CLOSURE SKIN 1/2X4 (GAUZE/BANDAGES/DRESSINGS) ×4 IMPLANT
SUT MNCRL AB 4-0 PS2 18 (SUTURE) ×2 IMPLANT
SUT STRATAFIX 0 PDS 27 VIOLET (SUTURE) ×1
SUT VIC AB 2-0 CT1 27 (SUTURE) ×3
SUT VIC AB 2-0 CT1 TAPERPNT 27 (SUTURE) ×6 IMPLANT
SUTURE STRATFX 0 PDS 27 VIOLET (SUTURE) ×2 IMPLANT
TAPE STRIPS DRAPE STRL (GAUZE/BANDAGES/DRESSINGS) IMPLANT
TIBIAL BASE ROT PLAT SZ 5 KNEE (Knees) ×1 IMPLANT
TRAY FOLEY W/BAG SLVR 14FR LF (SET/KITS/TRAYS/PACK) IMPLANT
TUBE SUCTION HIGH CAP CLEAR NV (SUCTIONS) ×2 IMPLANT
WATER STERILE IRR 1000ML POUR (IV SOLUTION) ×4 IMPLANT
WRAP KNEE MAXI GEL POST OP (GAUZE/BANDAGES/DRESSINGS) ×2 IMPLANT

## 2022-07-29 NOTE — Anesthesia Procedure Notes (Signed)
Spinal  Patient location during procedure: OR Start time: 07/29/2022 7:14 AM End time: 07/29/2022 7:17 AM Reason for block: surgical anesthesia Staffing Performed: anesthesiologist  Anesthesiologist: Josephine Igo, MD Performed by: Josephine Igo, MD Authorized by: Josephine Igo, MD   Preanesthetic Checklist Completed: patient identified, IV checked, site marked, risks and benefits discussed, surgical consent, monitors and equipment checked, pre-op evaluation and timeout performed Spinal Block Patient position: sitting Prep: DuraPrep and site prepped and draped Patient monitoring: heart rate, cardiac monitor, continuous pulse ox and blood pressure Approach: midline Location: L3-4 Injection technique: single-shot Needle Needle type: Pencan  Needle gauge: 24 G Needle length: 9 cm Needle insertion depth: 6 cm Assessment Sensory level: T6 Events: CSF return Additional Notes Patient tolerated procedure well. Adequate sensory level.

## 2022-07-29 NOTE — Transfer of Care (Signed)
Immediate Anesthesia Transfer of Care Note  Patient: Shelley Spencer  Procedure(s) Performed: TOTAL KNEE ARTHROPLASTY (Left: Knee)  Patient Location: PACU  Anesthesia Type:Spinal  Level of Consciousness: awake, alert , and oriented  Airway & Oxygen Therapy: Patient Spontanous Breathing and Patient connected to face mask oxygen  Post-op Assessment: Report given to RN and Post -op Vital signs reviewed and stable  Post vital signs: Reviewed and stable  Last Vitals:  Vitals Value Taken Time  BP 94/59 07/29/22 0845  Temp    Pulse 68 07/29/22 0848  Resp 19 07/29/22 0848  SpO2 99 % 07/29/22 0848  Vitals shown include unvalidated device data.  Last Pain:  Vitals:   07/29/22 0540  TempSrc: Oral  PainSc: 5       Patients Stated Pain Goal: 4 (32/12/24 8250)  Complications: No notable events documented.

## 2022-07-29 NOTE — Progress Notes (Signed)
Orthopedic Tech Progress Note Patient Details:  Shelley Spencer 05/12/1946 [id number]  Removed CPM from pt as the pt had been in it for 4 hours. Pt tolerated well.  CPM Left Knee CPM Left Knee: Off Left Knee Flexion (Degrees): 40 Left Knee Extension (Degrees): 10  Post Interventions Patient Tolerated: Well Instructions Provided: Adjustment of device, Care of device  Arville Go 07/29/2022, 2:01 PM

## 2022-07-29 NOTE — Interval H&P Note (Signed)
History and Physical Interval Note:  07/29/2022 6:26 AM  Shelley Spencer  has presented today for surgery, with the diagnosis of Left knee osteoarthritis.  The various methods of treatment have been discussed with the patient and family. After consideration of risks, benefits and other options for treatment, the patient has consented to  Procedure(s): TOTAL KNEE ARTHROPLASTY (Left) as a surgical intervention.  The patient's history has been reviewed, patient examined, no change in status, stable for surgery.  I have reviewed the patient's chart and labs.  Questions were answered to the patient's satisfaction.     Pilar Plate Shaya Altamura

## 2022-07-29 NOTE — Discharge Instructions (Signed)
Gaynelle Arabian, MD Total Joint Specialist [facility] [address] [place], [place] [address] [phone]  TOTAL KNEE REPLACEMENT POSTOPERATIVE DIRECTIONS    Knee Rehabilitation, Guidelines Following Surgery  Results after knee surgery are often greatly improved when you follow the exercise, range of motion and muscle strengthening exercises prescribed by your doctor. Safety measures are also important to protect the knee from further injury. If any of these exercises cause you to have increased pain or swelling in your knee joint, decrease the amount until you are comfortable again and slowly increase them. If you have problems or questions, call your caregiver or physical therapist for advice.   BLOOD CLOT PREVENTION Take a 325 mg Aspirin once a day for three weeks following surgery with your usual Plavix. Then take an 81 mg Aspirin once a day for three weeks. Then discontinue Aspirin. You may resume your vitamins/supplements upon discharge from the hospital.  [place] items at home which could result in a fall. This includes throw rugs or furniture in walking pathways.  ICE to the affected knee as much as tolerated. Icing helps control swelling. If the swelling is well controlled you will be more comfortable and rehab easier. Continue to use ice on the knee for pain and swelling from surgery. You may notice swelling that will progress down to the foot and ankle. This is normal after surgery. Elevate the leg when you are not up walking on it.    Continue to use the breathing machine which will help keep your temperature down. It is common for your temperature to cycle up and down following surgery, especially at night when you are not up moving around and exerting yourself. The breathing machine keeps your lungs expanded and your temperature down. Do not place pillow under the operative knee, focus on keeping the knee straight while  resting  DIET You may resume your previous home diet once you are discharged from the hospital.  DRESSING / WOUND CARE / SHOWERING Keep your bulky bandage on for 2 days. On the third post-operative day you may remove the Ace bandage and gauze. There is a waterproof adhesive bandage on your skin which will stay in place until your first follow-up appointment. Once you remove this you will not need to place another bandage You may begin showering 3 days following surgery, but do not submerge the incision under water.  ACTIVITY For the first 5 days, the key is rest and control of pain and swelling Do your home exercises twice a day starting on post-operative day 3. On the days you go to physical therapy, just do the home exercises once that day. You should rest, ice and elevate the leg for 50 minutes out of every hour. Get up and walk/stretch for 10 minutes per hour. After 5 days you can increase your activity slowly as tolerated. Walk with your walker as instructed. Use the walker until you are comfortable transitioning to a cane. Walk with the cane in the opposite hand of the operative leg. You may discontinue the cane once you are comfortable and walking steadily. Avoid periods of inactivity such as sitting longer than an hour when not asleep. This helps prevent blood clots.  You may discontinue the knee immobilizer once you are able to perform a straight leg raise while lying down. You may resume a sexual relationship in one month or when given the OK by your doctor.  You may return to work once  you are cleared by your doctor.  Do not drive a car for 6 weeks or until released by your surgeon.  Do not drive while taking narcotics.  TED HOSE STOCKINGS Wear the elastic stockings on both legs for three weeks following surgery during the day. You may remove them at night for sleeping.  WEIGHT BEARING Weight bearing as tolerated with assist device (walker, cane, etc) as directed, use it as long  as suggested by your surgeon or therapist, typically at least 4-6 weeks.  POSTOPERATIVE CONSTIPATION PROTOCOL Constipation - defined medically as fewer than three stools per week and severe constipation as less than one stool per week.  One of the most common issues patients have following surgery is constipation.  Even if you have a regular bowel pattern at home, your normal regimen is likely to be disrupted due to multiple reasons following surgery.  Combination of anesthesia, postoperative narcotics, change in appetite and fluid intake all can affect your bowels.  In order to avoid complications following surgery, here are some recommendations in order to help you during your recovery period.  Colace (docusate) - Pick up an over-the-counter form of Colace or another stool softener and take twice a day as long as you are requiring postoperative pain medications.  Take with a full glass of water daily.  If you experience loose stools or diarrhea, hold the colace until you stool forms back up. If your symptoms do not get better within 1 week or if they get worse, check with your doctor. Dulcolax (bisacodyl) - Pick up over-the-counter and take as directed by the product packaging as needed to assist with the movement of your bowels.  Take with a full glass of water.  Use this product as needed if not relieved by Colace only.  MiraLax (polyethylene glycol) - Pick up over-the-counter to have on hand. MiraLax is a solution that will increase the amount of water in your bowels to assist with bowel movements.  Take as directed and can mix with a glass of water, juice, soda, coffee, or tea. Take if you go more than two days without a movement. Do not use MiraLax more than once per day. Call your doctor if you are still constipated or irregular after using this medication for 7 days in a row.  If you continue to have problems with postoperative constipation, please contact the office for further assistance and  recommendations.  If you experience "the worst abdominal pain ever" or develop nausea or vomiting, please contact the office immediatly for further recommendations for treatment.  ITCHING If you experience itching with your medications, try taking only a single pain pill, or even half a pain pill at a time.  You can also use Benadryl over the counter for itching or also to help with sleep.   MEDICATIONS See your medication summary on the "After Visit Summary" that the nursing staff will review with you prior to discharge.  You may have some home medications which will be placed on hold until you complete the course of blood thinner medication.  It is important for you to complete the blood thinner medication as prescribed by your surgeon.  Continue your approved medications as instructed at time of discharge.  PRECAUTIONS If you experience chest pain or shortness of breath - call 911 immediately for transfer to the hospital emergency department.  If you develop a fever greater that 101 F, purulent drainage from wound, increased redness or drainage from wound, foul odor from the wound/dressing,  or calf pain - CONTACT YOUR SURGEON.                                                   FOLLOW-UP APPOINTMENTS Make sure you keep all of your appointments after your operation with your surgeon and caregivers. You should call the office at the above phone number and make an appointment for approximately two weeks after the date of your surgery or on the date instructed by your surgeon outlined in the "After Visit Summary".  RANGE OF MOTION AND STRENGTHENING EXERCISES  Rehabilitation of the knee is important following a knee injury or an operation. After just a few days of immobilization, the muscles of the thigh which control the knee become weakened and shrink (atrophy). Knee exercises are designed to build up the tone and strength of the thigh muscles and to improve knee motion. Often times heat used for  twenty to thirty minutes before working out will loosen up your tissues and help with improving the range of motion but do not use heat for the first two weeks following surgery. These exercises can be done on a training (exercise) mat, on the floor, on a table or on a bed. Use what ever works the best and is most comfortable for you Knee exercises include:  Leg Lifts - While your knee is still immobilized in a splint or cast, you can do straight leg raises. Lift the leg to 60 degrees, hold for 3 sec, and slowly lower the leg. Repeat 10-20 times 2-3 times daily. Perform this exercise against resistance later as your knee gets better.  Quad and Hamstring Sets - Tighten up the muscle on the front of the thigh (Quad) and hold for 5-10 sec. Repeat this 10-20 times hourly. Hamstring sets are done by pushing the foot backward against an object and holding for 5-10 sec. Repeat as with quad sets.  Leg Slides: Lying on your back, slowly slide your foot toward your buttocks, bending your knee up off the floor (only go as far as is comfortable). Then slowly slide your foot back down until your leg is flat on the floor again. Angel Wings: Lying on your back spread your legs to the side as far apart as you can without causing discomfort.  A rehabilitation program following serious knee injuries can speed recovery and prevent re-injury in the future due to weakened muscles. Contact your doctor or a physical therapist for more information on knee rehabilitation.   POST-OPERATIVE OPIOID TAPER INSTRUCTIONS: It is important to wean off of your opioid medication as soon as possible. If you do not need pain medication after your surgery it is ok to stop day one. Opioids include: Codeine, Hydrocodone(Norco, Vicodin), Oxycodone(Percocet, oxycontin) and hydromorphone amongst others.  Long term and even short term use of opiods can cause: Increased pain response Dependence Constipation Depression Respiratory depression And  more.  Withdrawal symptoms can include Flu like symptoms Nausea, vomiting And more Techniques to manage these symptoms Hydrate well Eat regular healthy meals Stay active Use relaxation techniques(deep breathing, meditating, yoga) Do Not substitute Alcohol to help with tapering If you have been on opioids for less than two weeks and do not have pain than it is ok to stop all together.  Plan to wean off of opioids This plan should start within one week post op of  your joint replacement. Maintain the same interval or time between taking each dose and first decrease the dose.  Cut the total daily intake of opioids by one tablet each day Next start to increase the time between doses. The last dose that should be eliminated is the evening dose.   IF YOU ARE TRANSFERRED TO A SKILLED REHAB FACILITY If the patient is transferred to a skilled rehab facility following release from the hospital, a list of the current medications will be sent to the facility for the patient to continue.  When discharged from the skilled rehab facility, please have the facility set up the patient's [place] prior to being released. Also, the skilled facility will be responsible for providing the patient with their medications at time of release from the facility to include their pain medication, the muscle relaxants, and their blood thinner medication. If the patient is still at the rehab facility at time of the two week follow up appointment, the skilled rehab facility will also need to assist the patient in arranging follow up appointment in our office and any transportation needs.  MAKE SURE YOU:  Understand these instructions.  Get help right away if you are not doing well or get worse.   DENTAL ANTIBIOTICS:  In most cases prophylactic antibiotics for Dental procdeures after total joint surgery are not necessary.  Exceptions are as follows:  1. History of prior total joint infection  2.  Severely immunocompromised (Organ Transplant, cancer chemotherapy, Rheumatoid biologic meds such as [place])  3. Poorly controlled diabetes (A1C &gt; 8.0, blood glucose over 200)  If you have one of these conditions, contact your surgeon for an antibiotic prescription, prior to your dental procedure.    Pick up stool softner and laxative for home use following surgery while on pain medications. Do not submerge incision under water. Please use good hand washing techniques while changing dressing each day. May shower starting three days after surgery. Please use a clean towel to pat the incision dry following showers. Continue to use ice for pain and swelling after surgery. Do not use any lotions or creams on the incision until instructed by your surgeon.

## 2022-07-29 NOTE — Progress Notes (Signed)
Orthopedic Tech Progress Note Patient Details:  JENTRY MCQUEARY 06/02/1946 [id number]  CPM was placed on the pt. Pt stated they did not feel any pain and felt comfortable in the machine.  CPM Left Knee CPM Left Knee: On Left Knee Flexion (Degrees): 40 Left Knee Extension (Degrees): 10  Post Interventions Patient Tolerated: Well Instructions Provided: Adjustment of device, Care of device  Arville Go 07/29/2022, 9:57 AM

## 2022-07-29 NOTE — Evaluation (Signed)
Physical Therapy Evaluation Patient Details Name: Shelley Spencer MRN: [id number] DOB: Jan 12, 1946 Today's Date: 07/29/2022  History of Present Illness  76 yo female s/p L TKA on 07/29/22. PMH: R TKA 2020, HTN, ACDF, DDD, arthritis, CVA  Clinical Impression  Pt is s/p TKA resulting in the deficits listed below (see PT Problem List).  Amb 100' with RW  and min assist.  Pt motivated and should continue to progress well.    Pt will benefit from skilled PT to increase their independence and safety with mobility to allow discharge to the venue listed below.         Recommendations for follow up therapy are one component of a multi-disciplinary discharge planning process, led by the attending physician.  Recommendations may be updated based on patient status, additional functional criteria and insurance authorization.  Follow Up Recommendations Follow physician's recommendations for discharge plan and follow up therapies      Assistance Recommended at Discharge Intermittent Supervision/Assistance  Patient can return home with the following  Help with stairs or ramp for entrance;Assist for transportation;Assistance with cooking/housework    Equipment Recommendations None recommended by PT  Recommendations for Other Services       Functional Status Assessment Patient has had a recent decline in their functional status and demonstrates the ability to make significant improvements in function in a reasonable and predictable amount of time.     Precautions / Restrictions Precautions Precautions: Fall;Knee Restrictions Weight Bearing Restrictions: No Other Position/Activity Restrictions: WBAT      Mobility  Bed Mobility Overal bed mobility: Needs Assistance Bed Mobility: Supine to Sit     Supine to sit: Supervision     General bed mobility comments: for lines and safety    Transfers Overall transfer level: Needs assistance Equipment used: Rolling walker (2 wheels) Transfers: Sit  to/from Stand Sit to Stand: Min guard           General transfer comment: cues for hand placement    Ambulation/Gait Ambulation/Gait assistance: Min guard Gait Distance (Feet): 100 Feet Assistive device: Rolling walker (2 wheels) Gait Pattern/deviations: Step-to pattern       General Gait Details: cues for sequence and step to  Stairs            Wheelchair Mobility    Modified Rankin (Stroke Patients Only)       Balance                                             Pertinent Vitals/Pain Pain Assessment Pain Assessment: Faces Faces Pain Scale: Hurts a little bit Pain Location: L knee Pain Descriptors / Indicators: Discomfort Pain Intervention(s): Limited activity within patient's tolerance, Monitored during session, Premedicated before session    Home Living Family/patient expects to be discharged to:: Private residence Living Arrangements: Spouse/significant other;Children (son) Available Help at Discharge: Family;Available 24 hours/day Type of Home: House Home Access: Stairs to enter   [employer] of Steps: 2   Home Layout: Two level;Able to live on main level with bedroom/bathroom Home Equipment: [place] (2 wheels) Additional Comments: son does cooking, housework as needed. husband has dementia    Prior Function Prior Level of Function : Independent/Modified Independent                     Hand Dominance        Extremity/Trunk Assessment  Upper Extremity Assessment Upper Extremity Assessment: Overall WFL for tasks assessed    Lower Extremity Assessment Lower Extremity Assessment: RLE deficits/detail RLE Deficits / Details: ankle WFL, knee extension and hip flexion 3/5       Communication   Communication: No difficulties  Cognition Arousal/Alertness: Awake/alert Behavior During Therapy: WFL for tasks assessed/performed Overall Cognitive Status: Within Functional Limits for tasks assessed                                           General Comments      Exercises Total Joint Exercises Ankle Circles/Pumps: AROM, Both, 10 reps   Assessment/Plan    PT Assessment Patient needs continued PT services  PT Problem List Decreased strength;Decreased activity tolerance;Decreased balance;Decreased mobility;Decreased knowledge of use of DME;Decreased range of motion       PT Treatment Interventions DME instruction;Therapeutic exercise;Gait training;Functional mobility training;Therapeutic activities;Patient/family education;Stair training    PT Goals (Current goals can be found in the Care Plan section)  Acute Rehab PT Goals PT Goal Formulation: With patient Time For Goal Achievement: 08/05/22 Potential to Achieve Goals: Good    Frequency 7X/week     Co-evaluation               AM-PAC PT "6 Clicks" Mobility  Outcome Measure Help needed turning from your back to your side while in a flat bed without using bedrails?: A Little Help needed moving from lying on your back to sitting on the side of a flat bed without using bedrails?: A Little Help needed moving to and from a bed to a chair (including a wheelchair)?: A Little Help needed standing up from a chair using your arms (e.g., wheelchair or bedside chair)?: A Little Help needed to walk in hospital room?: A Little Help needed climbing 3-5 steps with a railing? : A Little 6 Click Score: 18    End of Session Equipment Utilized During Treatment: Gait belt Activity Tolerance: Patient tolerated treatment well Patient left: with call bell/phone within reach;in chair;with chair alarm set Nurse Communication: Mobility status PT Visit Diagnosis: Other abnormalities of gait and mobility (R26.89);Difficulty in walking, not elsewhere classified (R26.2)    Time: 1436-1450 PT Time Calculation (min) (ACUTE ONLY): 14 min   Charges:   PT Evaluation $PT Eval Low Complexity: [place],  PT  Acute Rehab Dept [facility]) [phone]  [facility] Weekend Pager [facility] only)  [phone]  07/29/2022   [facility] 07/29/2022, 3:09 PM

## 2022-07-29 NOTE — Anesthesia Procedure Notes (Signed)
Anesthesia Regional Block: Adductor canal block   Pre-Anesthetic Checklist: , timeout performed,  Correct Patient, Correct Site, Correct Laterality,  Correct Procedure, Correct Position, site marked,  Risks and benefits discussed,  Surgical consent,  Pre-op evaluation,  At surgeon's request and post-op pain management  Laterality: Left  Prep: chloraprep       Needles:  Injection technique: Single-shot  Needle Type: Echogenic Stimulator Needle     Needle Length: 10cm  Needle Gauge: 21   Needle insertion depth: 7 cm   Additional Needles:   Procedures:,,,, ultrasound used (permanent image in chart),,    Narrative:  Start time: 07/29/2022 6:57 AM End time: 07/29/2022 7:02 AM Injection made incrementally with aspirations every 5 mL.  Performed by: Personally  Anesthesiologist: Josephine Igo, MD  Additional Notes: Timeout performed. Patient sedated. Relevant anatomy ID'd using [place]. Incremental 2-33m injection of LA with frequent aspiration. Patient tolerated procedure well.     Left Adductor Canal Block

## 2022-07-29 NOTE — Anesthesia Postprocedure Evaluation (Signed)
Anesthesia Post Note  Patient: Shelley Spencer  Procedure(s) Performed: TOTAL KNEE ARTHROPLASTY (Left: Knee)     Patient location during evaluation: PACU Anesthesia Type: Spinal Level of consciousness: oriented and awake and alert Pain management: pain level controlled Vital Signs Assessment: post-procedure vital signs reviewed and stable Respiratory status: spontaneous breathing, respiratory function stable and nonlabored ventilation Cardiovascular status: blood pressure returned to baseline and stable Postop Assessment: no headache, no backache, no apparent nausea or vomiting, spinal receding and patient able to bend at knees Anesthetic complications: no   No notable events documented.  Last Vitals:  Vitals:   07/29/22 0930 07/29/22 0951  BP: 126/67 138/66  Pulse: 61 62  Resp: 11 16  Temp:  36.8 C  SpO2: 98% 90%    Last Pain:  Vitals:   07/29/22 0951  TempSrc: Oral  PainSc: 0-No pain                 Ahmod Gillespie A.

## 2022-07-29 NOTE — Op Note (Signed)
OPERATIVE REPORT-TOTAL KNEE ARTHROPLASTY   Pre-operative diagnosis- Osteoarthritis  Left knee(s)  Post-operative diagnosis- Osteoarthritis Left knee(s)  Procedure-  Left  Total Knee Arthroplasty  Surgeon- Dione Plover. Alexzandrea Normington, MD  Assistant- Theresa Duty, PA-C   Anesthesia-   Adductor canal block and spinal  EBL-25 mL   Drains None  Tourniquet time- 29 minutes @ 194 mm Hg    Complications- None  Condition-PACU - hemodynamically stable.   Brief Clinical Note  Shelley Spencer is a 76 y.o. year old female with end stage OA of her left knee with progressively worsening pain and dysfunction. She has constant pain, with activity and at rest and significant functional deficits with difficulties even with ADLs. She has had extensive non-op management including analgesics, injections of cortisone and viscosupplements, and home exercise program, but remains in significant pain with significant dysfunction. Radiographs show bone on bone arthritis lateral and patellofemoral. She presents now for left Total Knee Arthroplasty.     Procedure in detail---   The patient is brought into the operating room and positioned supine on the operating table. After successful administration of  Adductor canal block and spinal,   a tourniquet is placed high on the  Left thigh(s) and the lower extremity is prepped and draped in the usual sterile fashion. Time out is performed by the operating team and then the  Left lower extremity is wrapped in Esmarch, knee flexed and the tourniquet inflated to 300 mmHg.       A midline incision is made with a ten blade through the subcutaneous tissue to the level of the extensor mechanism. A fresh blade is used to make a medial parapatellar arthrotomy. Soft tissue over the proximal medial tibia is subperiosteally elevated to the joint line with a knife and into the semimembranosus bursa with a Cobb elevator. Soft tissue over the proximal lateral tibia is elevated with  attention being paid to avoiding the patellar tendon on the tibial tubercle. The patella is everted, knee flexed 90 degrees and the ACL and PCL are removed. Findings are bone on bone lateral and patellofemoral with large global osteophytes       The drill is used to create a starting hole in the distal femur and the canal is thoroughly irrigated with sterile saline to remove the fatty contents. The 5 degree Left  valgus alignment guide is placed into the femoral canal and the distal femoral cutting block is pinned to remove 9 mm off the distal femur. Resection is made with an oscillating saw.      The tibia is subluxed forward and the menisci are removed. The extramedullary alignment guide is placed referencing proximally at the medial aspect of the tibial tubercle and distally along the second metatarsal axis and tibial crest. The block is pinned to remove 63m off the more deficient lateral  side. Resection is made with an oscillating saw. Size 5is the most appropriate size for the tibia and the proximal tibia is prepared with the modular drill and keel punch for that size.      The femoral sizing guide is placed and size 5 is most appropriate. Rotation is marked off the epicondylar axis and confirmed by creating a rectangular flexion gap at 90 degrees. The size 5 cutting block is pinned in this rotation and the anterior, posterior and chamfer cuts are made with the oscillating saw. The intercondylar block is then placed and that cut is made.      Trial size 5 tibial component, trial  size 5 posterior stabilized femur and a 10  mm posterior stabilized rotating platform insert trial is placed. Full extension is achieved with excellent varus/valgus and anterior/posterior balance throughout full range of motion. The patella is everted and thickness measured to be 22  mm. Free hand resection is taken to 12 mm, a 35 template is placed, lug holes are drilled, trial patella is placed, and it tracks normally.  Osteophytes are removed off the posterior femur with the trial in place. All trials are removed and the cut bone surfaces prepared with pulsatile lavage. Cement is mixed and once ready for implantation, the size 5 tibial implant, size  5 posterior stabilized femoral component, and the size 35 patella are cemented in place and the patella is held with the clamp. The trial insert is placed and the knee held in full extension. The Exparel (20 ml mixed with 60 ml saline) is injected into the extensor mechanism, posterior capsule, medial and lateral gutters and subcutaneous tissues.  All extruded cement is removed and once the cement is hard the permanent 10 mm posterior stabilized rotating platform insert is placed into the tibial tray.      The wound is copiously irrigated with saline solution and the extensor mechanism closed with # 0 Stratofix suture. The tourniquet is released for a total tourniquet time of 29  minutes. Flexion against gravity is 140 degrees and the patella tracks normally. Subcutaneous tissue is closed with 2.0 vicryl and subcuticular with running 4.0 Monocryl. The incision is cleaned and dried and steri-strips and a bulky sterile dressing are applied. The limb is placed into a knee immobilizer and the patient is awakened and transported to recovery in stable condition.      Please note that a surgical assistant was a medical necessity for this procedure in order to perform it in a safe and expeditious manner. Surgical assistant was necessary to retract the ligaments and vital neurovascular structures to prevent injury to them and also necessary for proper positioning of the limb to allow for anatomic placement of the prosthesis.   Dione Plover Gwenda Heiner, MD    07/29/2022, 8:29 AM

## 2022-07-30 ENCOUNTER — Encounter (HOSPITAL_COMMUNITY): Payer: Self-pay | Admitting: Orthopedic Surgery

## 2022-07-30 DIAGNOSIS — M1712 Unilateral primary osteoarthritis, left knee: Secondary | ICD-10-CM | POA: Diagnosis not present

## 2022-07-30 LAB — BASIC METABOLIC PANEL
Anion gap: 4 — ABNORMAL LOW (ref 5–15)
BUN: 18 mg/dL (ref 8–23)
CO2: 25 mmol/L (ref 22–32)
Calcium: 8.5 mg/dL — ABNORMAL LOW (ref 8.9–10.3)
Chloride: 113 mmol/L — ABNORMAL HIGH (ref 98–111)
Creatinine, Ser: 1.26 mg/dL — ABNORMAL HIGH (ref 0.44–1.00)
GFR, Estimated: 45 mL/min — ABNORMAL LOW (ref >60)
Glucose, Bld: 159 mg/dL — ABNORMAL HIGH (ref 70–99)
Potassium: 4.2 mmol/L (ref 3.5–5.1)
Sodium: 142 mmol/L (ref 135–145)

## 2022-07-30 LAB — CBC
HCT: 34.7 % — ABNORMAL LOW (ref 36.0–46.0)
Hemoglobin: 11 g/dL — ABNORMAL LOW (ref 12.0–15.0)
MCH: 26.9 pg (ref 26.0–34.0)
MCHC: 31.7 g/dL (ref 30.0–36.0)
MCV: 84.8 fL (ref 80.0–100.0)
Platelets: 208 10*3/uL (ref 150–400)
RBC: 4.09 MIL/uL (ref 3.87–5.11)
RDW: 14.2 % (ref 11.5–15.5)
WBC: 11.9 10*3/uL — ABNORMAL HIGH (ref 4.0–10.5)
nRBC: 0 % (ref 0.0–0.2)

## 2022-07-30 MED ORDER — OXYCODONE HCL 5 MG PO TABS: 5.0000 mg | ORAL_TABLET | Freq: Four times a day (QID) | ORAL | 0 refills | Status: DC | PRN

## 2022-07-30 MED ORDER — ASPIRIN 325 MG PO TBEC: 325.0000 mg | DELAYED_RELEASE_TABLET | Freq: Every day | ORAL | 0 refills | Status: AC

## 2022-07-30 MED ORDER — METHOCARBAMOL 500 MG PO TABS: 500.0000 mg | ORAL_TABLET | Freq: Four times a day (QID) | ORAL | 0 refills | Status: DC | PRN

## 2022-07-30 NOTE — Plan of Care (Signed)
Pt ready to DC home with son and husband.

## 2022-07-30 NOTE — Progress Notes (Signed)
Subjective: 1 Day Post-Op Procedure(s) (LRB): TOTAL KNEE ARTHROPLASTY (Left) Patient reports pain as mild.   Patient seen in rounds for Dr. Wynelle Link. Patient is well, and has had no acute complaints or problems. No issues overnight. Denies chest pain or SOB. Foley catheter removed this AM. We will continue therapy today, ambulated 100' yesterday.  Objective: Vital signs in last 24 hours: Temp:  [97.5 F (36.4 C)-98.2 F (36.8 C)] 97.7 F (36.5 C) (11/07 0648) Pulse Rate:  [60-76] 64 (11/07 0648) Resp:  [11-22] 18 (11/07 0648) BP: (94-192)/(59-83) 157/70 (11/07 0648) SpO2:  [90 %-100 %] 97 % (11/07 0648)  Intake/Output from previous day:  Intake/Output Summary (Last 24 hours) at 07/30/2022 0746 Last data filed at 07/30/2022 0600 Gross per 24 hour  Intake 3606.05 ml  Output 1425 ml  Net 2181.05 ml     Intake/Output this shift: No intake/output data recorded.  Labs: Recent Labs    07/30/22 0413  HGB 11.0*   Recent Labs    07/30/22 0413  WBC 11.9*  RBC 4.09  HCT 34.7*  PLT 208   Recent Labs    07/30/22 0413  NA 142  K 4.2  CL 113*  CO2 25  BUN 18  CREATININE 1.26*  GLUCOSE 159*  CALCIUM 8.5*   No results for input(s): "LABPT", "INR" in the last 72 hours.  Exam: General - Patient is Alert and Oriented Extremity - Neurologically intact Neurovascular intact Sensation intact distally Dorsiflexion/Plantar flexion intact Dressing - dressing C/D/I Motor Function - intact, moving foot and toes well on exam.   Past Medical History:  Diagnosis Date   Anemia    Anxiety    Arthritis    Asthma    Bicornuate uterus    double cervix   CAD (coronary artery disease)    Colon polyp    Depression    Deviated septum    DJD (degenerative joint disease)    L-Spine   Fibroids    GERD (gastroesophageal reflux disease)    Hematuria    History of hiatal hernia    HNP (herniated nucleus pulposus)    lower back   Hyperlipidemia    Hypertension    MVP  (mitral valve prolapse)    pt denies, pt states she had a Echo ?2008 and it did not show MVP   Pre-diabetes    Retinal tear 2005   Small right retinal tear   Shingles 1980's   on her waist   Sleep apnea    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   Weight as of this encounter: 74.8 kg. Advance diet Up with therapy D/C IV fluids   Patient's anticipated LOS is less than 2 midnights, meeting these requirements: - Lives within 1 hour of care - Has a competent adult at home to recover with post-op recover - NO history of  - Chronic pain requiring opioids  - Diabetes  - Heart failure  - Heart attack  - DVT/VTE  - Cardiac arrhythmia  - Respiratory Failure/COPD  - Renal failure  - Anemia  - Advanced Liver disease  DVT Prophylaxis - Aspirin and Plavix Weight bearing as tolerated. Continue therapy.  Plan is to go Home after  hospital stay. Plan for discharge if progresses with therapy and meeting goals. Scheduled for OPPT at [facility] Follow-up in the office in 2 weeks  The PDMP database was reviewed today prior to any opioid medications being prescribed to this patient.  Theresa Duty, PA-C Orthopedic Surgery [phone] 07/30/2022, 7:46 AM

## 2022-07-30 NOTE — Progress Notes (Signed)
Physical Therapy Treatment Patient Details Name: Shelley Spencer MRN: [id number] DOB: June 22, 1946 Today's Date: 07/30/2022   History of Present Illness 76 yo female s/p L TKA on 07/29/22. PMH: R TKA 2020, HTN, ACDF, DDD, arthritis, CVA    PT Comments    Pt is progressing quite well. Will see for a second session since pt's ride will be coming in the afternoon and pt has a time gap prior to starting OPPT, will benefit from continued education/mobility   Recommendations for follow up therapy are one component of a multi-disciplinary discharge planning process, led by the attending physician.  Recommendations may be updated based on patient status, additional functional criteria and insurance authorization.  Follow Up Recommendations  Follow physician's recommendations for discharge plan and follow up therapies     Assistance Recommended at Discharge Intermittent Supervision/Assistance  Patient can return home with the following Help with stairs or ramp for entrance;Assist for transportation;Assistance with cooking/housework   Equipment Recommendations  None recommended by PT    Recommendations for Other Services       Precautions / Restrictions Precautions Precautions: Fall;Knee Precaution Comments: IND SLR Restrictions Weight Bearing Restrictions: No LLE Weight Bearing: Weight bearing as tolerated Other Position/Activity Restrictions: WBAT     Mobility  Bed Mobility Overal bed mobility: Needs Assistance Bed Mobility: Supine to Sit     Supine to sit: Supervision, Modified independent (Device/Increase time)          Transfers   Equipment used: Rolling walker (2 wheels) Transfers: Sit to/from Stand Sit to Stand: Supervision           General transfer comment: cues for hand placement    Ambulation/Gait Ambulation/Gait assistance: Min guard Gait Distance (Feet): 180 Feet Assistive device: Rolling walker (2 wheels) Gait Pattern/deviations: Step-to pattern,  Step-through pattern       General Gait Details: progression to step through with good stability, no knee buckling   Stairs             Wheelchair Mobility    Modified Rankin (Stroke Patients Only)       Balance                                            Cognition Arousal/Alertness: Awake/alert Behavior During Therapy: WFL for tasks assessed/performed Overall Cognitive Status: Within Functional Limits for tasks assessed                                          Exercises Total Joint Exercises Ankle Circles/Pumps: AROM, Both, 10 reps Quad Sets: AROM, Both, 10 reps Heel Slides: AAROM, 10 reps, Left Straight Leg Raises: AROM, Left, 10 reps Long Arc Quad: AROM, Left, 10 reps    General Comments        Pertinent Vitals/Pain Pain Assessment Pain Assessment: 0-10 Pain Score: 5  Pain Location: L knee Pain Descriptors / Indicators: Discomfort Pain Intervention(s): Limited activity within patient's tolerance, Monitored during session, Repositioned, Premedicated before session, Ice applied    Home Living                          Prior Function            PT Goals (current goals can now be found in the  care plan section) Acute Rehab PT Goals PT Goal Formulation: With patient Time For Goal Achievement: 08/05/22 Potential to Achieve Goals: Good Progress towards PT goals: Progressing toward goals    Frequency    7X/week      PT Plan Current plan remains appropriate    Co-evaluation              AM-PAC PT "6 Clicks" Mobility   Outcome Measure  Help needed turning from your back to your side while in a flat bed without using bedrails?: None Help needed moving from lying on your back to sitting on the side of a flat bed without using bedrails?: A Little Help needed moving to and from a bed to a chair (including a wheelchair)?: A Little Help needed standing up from a chair using your arms (e.g.,  wheelchair or bedside chair)?: A Little Help needed to walk in hospital room?: A Little Help needed climbing 3-5 steps with a railing? : A Little 6 Click Score: 19    End of Session Equipment Utilized During Treatment: Gait belt Activity Tolerance: Patient tolerated treatment well Patient left: with call bell/phone within reach;in chair;with chair alarm set Nurse Communication: Mobility status PT Visit Diagnosis: Other abnormalities of gait and mobility (R26.89);Difficulty in walking, not elsewhere classified (R26.2)     Time: 1607-3710 PT Time Calculation (min) (ACUTE ONLY): 19 min  Charges:  $Gait Training: 8-22 mins                     Baxter Flattery, PT  Acute Rehab Dept [facility]) [phone]  [facility] Weekend Pager [facility] only)  [phone]  07/30/2022    [facility] 07/30/2022, 11:50 AM

## 2022-07-30 NOTE — TOC Transition Note (Signed)
Transition of Care [facility]) - CM/SW Discharge Note   Patient Details  Name: TENNESSEE HANLON MRN: [id number] Date of Birth: 09/23/1946  Transition of Care [facility]) CM/SW Contact:  Lennart Pall, LCSW Phone Number: 07/30/2022, 11:03 AM   Clinical Narrative:     Met with pt and confirming she has needed DME at home.  OPPT arranged with [facility].  No further TOC needs.  Final next level of care: OP Rehab Barriers to Discharge: No Barriers Identified   Patient Goals and CMS Choice Patient states their goals for this hospitalization and ongoing recovery are:: return home      Discharge Placement                       Discharge Plan and Services                DME Arranged: N/A DME Agency: NA                  Social Determinants of Health (SDOH) Interventions     Readmission Risk Interventions     No data to display

## 2022-07-30 NOTE — Progress Notes (Signed)
PT TX NOTE  07/30/22 1300  PT Visit Information  Last PT Received On 07/30/22  Assistance Needed Pt progressing well  this pm, cues for safety with RW, keeping both hands on walker. reinforced transfer safety as well.  Pt feels ready to d/c with family assist as needed. Provided with TKA HEP   History of Present Illness 76 yo female s/p L TKA on 07/29/22. PMH: R TKA 2020, HTN, ACDF, DDD, arthritis, CVA  Precautions  Precautions Fall;Knee  Precaution Comments IND SLR  Restrictions  Weight Bearing Restrictions No  Other Position/Activity Restrictions WBAT  Pain Assessment  Pain Assessment 0-10  Pain Score 5  Pain Location L knee  Pain Descriptors / Indicators Discomfort  Pain Intervention(s) Limited activity within patient's tolerance  Cognition  Arousal/Alertness Awake/alert  Behavior During Therapy WFL for tasks assessed/performed  Overall Cognitive Status Within Functional Limits for tasks assessed  Transfers  Equipment used Rolling walker (2 wheels)  Transfers Sit to/from Stand  Sit to Stand Supervision  General transfer comment cues for hand placement  Ambulation/Gait  Ambulation/Gait assistance Min guard;Supervision  Gait Distance (Feet) 110 Feet  Assistive device Rolling walker (2 wheels)  Gait Pattern/deviations Step-to pattern;Step-through pattern  General Gait Details cues for RW safety and position from self, especially with turns  Total Joint Exercises  Ankle Circles/Pumps AROM;Both;10 reps  PT - End of Session  Equipment Utilized During Treatment Gait belt  Activity Tolerance Patient tolerated treatment well  Patient left with call bell/phone within reach;in chair;with chair alarm set  Nurse Communication Mobility status   PT - Assessment/Plan  PT Plan Current plan remains appropriate  PT Visit Diagnosis Other abnormalities of gait and mobility (R26.89);Difficulty in walking, not elsewhere classified (R26.2)  PT Frequency (ACUTE ONLY) 7X/week  Follow Up  Recommendations Follow physician's recommendations for discharge plan and follow up therapies  Assistance recommended at discharge Intermittent Supervision/Assistance  Patient can return home with the following Help with stairs or ramp for entrance;Assist for transportation;Assistance with cooking/housework  PT equipment None recommended by PT  AM-PAC PT "6 Clicks" Mobility Outcome Measure (Version 2)  Help needed turning from your back to your side while in a flat bed without using bedrails? 4  Help needed moving from lying on your back to sitting on the side of a flat bed without using bedrails? 3  Help needed moving to and from a bed to a chair (including a wheelchair)? 3  Help needed standing up from a chair using your arms (e.g., wheelchair or bedside chair)? 3  Help needed to walk in hospital room? 3  Help needed climbing 3-5 steps with a railing?  3  6 Click Score 19  Consider Recommendation of Discharge To: Home with [facility]  PT Goal Progression  Progress towards PT goals Progressing toward goals  Acute Rehab PT Goals  PT Goal Formulation With patient  Time For Goal Achievement 08/05/22  Potential to Achieve Goals Good  PT Time Calculation  PT Start Time (ACUTE ONLY) 1342  PT Stop Time (ACUTE ONLY) 1358  PT Time Calculation (min) (ACUTE ONLY) 16 min  PT General Charges  $$ ACUTE PT VISIT 1 Visit  PT Treatments  $Gait Training 8-22 mins

## 2022-07-31 NOTE — Discharge Summary (Signed)
Patient ID: Shelley Spencer MRN: [id number] DOB/AGE: Feb 27, 1946 76 y.o.  Admit date: 07/29/2022 Discharge date: 07/30/2022  Admission Diagnoses:  Principal Problem:   OA (osteoarthritis) of knee Active Problems:   Primary osteoarthritis of knee   Discharge Diagnoses:  Same  Past Medical History:  Diagnosis Date   Anemia    Anxiety    Arthritis    Asthma    Bicornuate uterus    double cervix   CAD (coronary artery disease)    Colon polyp    Depression    Deviated septum    DJD (degenerative joint disease)    L-Spine   Fibroids    GERD (gastroesophageal reflux disease)    Hematuria    History of hiatal hernia    HNP (herniated nucleus pulposus)    lower back   Hyperlipidemia    Hypertension    MVP (mitral valve prolapse)    pt denies, pt states she had a Echo ?2008 and it did not show MVP   Pre-diabetes    Retinal tear 2005   Small right retinal tear   Shingles 1980's   on her waist   Sleep apnea    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 female who was admitted 07/29/2022 for operative treatment ofOA (osteoarthritis) of knee. Patient has severe unremitting pain that affects sleep, daily activities, and work/hobbies. After pre-op clearance the patient was taken to the operating room on 07/29/2022 and underwent  Procedure(s): TOTAL KNEE ARTHROPLASTY.    Patient was given perioperative antibiotics:  Anti-infectives (From admission, onward)    Start     Dose/Rate Route Frequency Ordered Stop   07/29/22 1400  ceFAZolin (ANCEF) IVPB 2g/100 mL premix        2 g 200 mL/hr over 30 Minutes Intravenous Every 6 hours 07/29/22 0953 07/29/22 2028   07/29/22 0600  ceFAZolin (ANCEF) IVPB 2g/100 mL premix        2 g 200 mL/hr over 30 Minutes Intravenous On call to O.R. 07/29/22 0522 07/29/22 0726        Patient was  given sequential compression devices, early ambulation, and chemoprophylaxis to prevent DVT.  Patient benefited maximally from hospital stay and there were no complications.    Recent vital signs: Patient Vitals for the past 24 hrs:  BP Temp Pulse Resp SpO2  07/30/22 0948 (!) 181/85 97.6 F (36.4 C) 67 17 95 %     Recent laboratory studies:  Recent Labs    07/30/22 0413  WBC 11.9*  HGB 11.0*  HCT 34.7*  PLT 208  NA 142  K 4.2  CL 113*  CO2 25  BUN 18  CREATININE 1.26*  GLUCOSE 159*  CALCIUM 8.5*     Discharge Medications:   Allergies as of 07/30/2022       Reactions   Amlodipine Other (See Comments), Swelling   Legs swell some and she doesn't like it.   Fenofibrate Other (See Comments)   Leg pain   Omeprazole Other (See Comments)   Unknown   Simvastatin Other (See Comments)   Myalgia, leg pains   Singulair [montelukast Sodium] Other (See Comments)   Affected mood   Statins Other (See Comments)   Myalgia, leg pains        Medication List     STOP taking these medications    Trulicity 3 IR/4.4RX  Sopn Generic drug: Dulaglutide       TAKE these medications    acetaminophen 650 MG CR tablet Commonly known as: TYLENOL Take 1,300 mg by mouth every 8 (eight) hours as needed for pain.   albuterol 108 (90 Base) MCG/ACT inhaler Commonly known as: VENTOLIN HFA Inhale 1-2 puffs into the lungs every 6 (six) hours as needed for wheezing or shortness of breath.   ALPRAZolam 0.25 MG tablet Commonly known as: XANAX Take 0.25 mg by mouth daily as needed for anxiety.   aspirin EC 325 MG tablet Take 1 tablet (325 mg total) by mouth daily for 20 days. Then take one 81 mg aspirin once a day for three weeks. Then discontinue aspirin.   budesonide-formoterol 160-4.5 MCG/ACT inhaler Commonly known as: SYMBICORT Inhale 2 puffs into the lungs daily as needed (shortness of breath).   buPROPion 150 MG 24 hr tablet Commonly known as: WELLBUTRIN XL Take 150 mg by  mouth daily.   CALCIUM-VITAMIN D PO Take 1 tablet by mouth daily.   cetirizine 10 MG tablet Commonly known as: ZYRTEC Take 10 mg by mouth daily.   clopidogrel 75 MG tablet Commonly known as: PLAVIX TAKE 1 TABLET BY MOUTH EVERY DAY   cyanocobalamin 1000 MCG tablet Commonly known as: VITAMIN B12 Take 1,000 mcg by mouth daily.   furosemide 20 MG tablet Commonly known as: LASIX Take 20 mg by mouth daily.   gabapentin 300 MG capsule Commonly known as: NEURONTIN Take 300 mg by mouth at bedtime.   isosorbide mononitrate 60 MG 24 hr tablet Commonly known as: IMDUR Take 1 tablet (60 mg total) by mouth daily.   magnesium gluconate 500 MG tablet Commonly known as: MAGONATE Take 500 mg by mouth daily.   methocarbamol 500 MG tablet Commonly known as: ROBAXIN Take 1 tablet (500 mg total) by mouth every 6 (six) hours as needed for muscle spasms.   metoprolol succinate 25 MG 24 hr tablet Commonly known as: TOPROL-XL Take 1 tablet (25 mg total) by mouth daily. Take with or immediately following a meal.   Mounjaro 10 MG/0.5ML Pen Generic drug: tirzepatide Inject 1 pen (10 MG) into the skin once a week   MULTIVITAMIN PO Take 1 tablet by mouth daily.   nitroGLYCERIN 0.4 MG SL tablet Commonly known as: NITROSTAT Place 1 tablet (0.4 mg total) under the tongue every 5 (five) minutes as needed for chest pain.   NON FORMULARY CPAP   olmesartan 40 MG tablet Commonly known as: BENICAR Take 40 mg by mouth daily. What changed: Another medication with the same name was removed. Continue taking this medication, and follow the directions you see here.   ondansetron 4 MG tablet Commonly known as: ZOFRAN Take 1 tablet (4 mg total) by mouth every 6 (six) hours.   oxyCODONE 5 MG immediate release tablet Commonly known as: Oxy IR/ROXICODONE Take 1-2 tablets (5-10 mg total) by mouth every 6 (six) hours as needed for moderate pain or severe pain.   OXYGEN Inhale 3 L into the lungs at  bedtime.   pantoprazole 40 MG tablet Commonly known as: PROTONIX Take 1 tablet (40 mg total) by mouth daily. What changed: when to take this   PARoxetine 40 MG tablet Commonly known as: PAXIL Take 40 mg by mouth every evening.   potassium chloride 10 MEQ tablet Commonly known as: KLOR-CON Take 10 mEq by mouth daily.   Praluent 150 MG/ML Soaj Generic drug: Alirocumab INJECT 1 SYRINGE INTO THE SKIN EVERY 14 (FOURTEEN) DAYS.  traZODone 150 MG tablet Commonly known as: DESYREL Take 150 mg by mouth at bedtime.               Discharge Care Instructions  (From admission, onward)           Start     Ordered   07/30/22 0000  Weight bearing as tolerated        07/30/22 0756   07/30/22 0000  Change dressing       Comments: You may remove the bulky bandage (ACE wrap and gauze) two days after surgery. You will have an adhesive waterproof bandage underneath. Leave this in place until your first follow-up appointment.   07/30/22 0756            Diagnostic Studies: No results found.  Disposition: Discharge disposition: 01-Home or Self Care       Discharge Instructions     Call MD / Call 911   Complete by: As directed    If you experience chest pain or shortness of breath, CALL 911 and be transported to the hospital emergency room.  If you develope a fever above 101 F, pus (white drainage) or increased drainage or redness at the wound, or calf pain, call your surgeon's office.   Change dressing   Complete by: As directed    You may remove the bulky bandage (ACE wrap and gauze) two days after surgery. You will have an adhesive waterproof bandage underneath. Leave this in place until your first follow-up appointment.   Constipation Prevention   Complete by: As directed    Drink plenty of fluids.  Prune juice may be helpful.  You may use a stool softener, such as Colace (over the counter) 100 mg twice a day.  Use MiraLax (over the counter) for constipation as  needed.   Diet - low sodium heart healthy   Complete by: As directed    Do not put a pillow under the knee. Place it under the heel.   Complete by: As directed    Driving restrictions   Complete by: As directed    No driving for two weeks   Post-operative opioid taper instructions:   Complete by: As directed    POST-OPERATIVE OPIOID TAPER INSTRUCTIONS: It is important to wean off of your opioid medication as soon as possible. If you do not need pain medication after your surgery it is ok to stop day one. Opioids include: Codeine, Hydrocodone(Norco, Vicodin), Oxycodone(Percocet, oxycontin) and hydromorphone amongst others.  Long term and even short term use of opiods can cause: Increased pain response Dependence Constipation Depression Respiratory depression And more.  Withdrawal symptoms can include Flu like symptoms Nausea, vomiting And more Techniques to manage these symptoms Hydrate well Eat regular healthy meals Stay active Use relaxation techniques(deep breathing, meditating, yoga) Do Not substitute Alcohol to help with tapering If you have been on opioids for less than two weeks and do not have pain than it is ok to stop all together.  Plan to wean off of opioids This plan should start within one week post op of your joint replacement. Maintain the same interval or time between taking each dose and first decrease the dose.  Cut the total daily intake of opioids by one tablet each day Next start to increase the time between doses. The last dose that should be eliminated is the evening dose.      TED hose   Complete by: As directed    Use stockings (TED hose) for  three weeks on both leg(s).  You may remove them at night for sleeping.   Weight bearing as tolerated   Complete by: As directed         Follow-up Information     Aluisio, Pilar Plate, MD. Schedule an appointment as soon as possible for a visit in 2 week(s).   Specialty: Orthopedic Surgery Contact  information: [address] [place] [address] [phone]                  Signed: Theresa Duty 07/31/2022, 9:19 AM

## 2022-08-17 ENCOUNTER — Other Ambulatory Visit (HOSPITAL_COMMUNITY): Payer: Self-pay

## 2022-08-22 ENCOUNTER — Other Ambulatory Visit (HOSPITAL_COMMUNITY): Payer: Medicare Other

## 2022-09-03 ENCOUNTER — Ambulatory Visit: Payer: Medicare Other | Admitting: Internal Medicine

## 2022-09-03 ENCOUNTER — Encounter: Payer: Self-pay | Admitting: Internal Medicine

## 2022-09-03 VITALS — BP 110/67 | HR 91 | Ht 62.0 in | Wt 164.2 lb

## 2022-09-03 DIAGNOSIS — I10 Essential (primary) hypertension: Secondary | ICD-10-CM

## 2022-09-03 DIAGNOSIS — E7800 Pure hypercholesterolemia, unspecified: Secondary | ICD-10-CM

## 2022-09-03 DIAGNOSIS — I25118 Atherosclerotic heart disease of native coronary artery with other forms of angina pectoris: Secondary | ICD-10-CM

## 2022-09-03 NOTE — Progress Notes (Signed)
Primary Physician:  Crist Infante, MD   Patient ID: Shelley Spencer, female    DOB: February 24, 1946, 76 y.o.   MRN: [id number]  Subjective:    Chief Complaint  Patient presents with   Follow-up   Hypertension    HPI: Shelley Spencer  is a 76 y.o. female  with asthma, hypertension, hyperlipidemia, hyperglycemia, CKD stage 3, former tobacco use with moderate CAD with LAD 50-60% stenosis by coronary CTA on 03/30/2019  (not significant stenosis by FFR), moderate AR by echo in July 2020.  She is a former smoker that quit in 1981. Patient was admitted to the hospital on 03/14/2020 with gait disturbance involving her left lower extremity, had a right pontine stroke.  Diagnosed with OSA CPAP, compliant.  Patient is here for a follow-up visit. She has been doing well since the last time she was here. She is tolerating her medications without issues and her blood pressure is very well controlled. Denies palpitations, dyspnea, syncope, near syncope, orthopnea, PND, leg edema.   Past Medical History:  Diagnosis Date   Anemia    Anxiety    Arthritis    Asthma    Bicornuate uterus    double cervix   CAD (coronary artery disease)    Colon polyp    Depression    Deviated septum    DJD (degenerative joint disease)    L-Spine   Fibroids    GERD (gastroesophageal reflux disease)    Hematuria    History of hiatal hernia    HNP (herniated nucleus pulposus)    lower back   Hyperlipidemia    Hypertension    MVP (mitral valve prolapse)    pt denies, pt states she had a Echo ?2008 and it did not show MVP   Pre-diabetes    Retinal tear 2005   Small right retinal tear   Shingles 1980's   on her waist   Sleep apnea    Stroke (cerebrum) ([place]) 02/2020    Past Surgical History:  Procedure Laterality Date   ANTERIOR CERVICAL DECOMP/DISCECTOMY FUSION N/A 09/05/2017   Procedure: Cervical four-five, Cervical five-six, Cervical six-seven Anterior cervical decompression/discectomy/fusion;  Surgeon: Erline Levine, MD;  Location: [facility];  Service: Neurosurgery;  Laterality: N/A;   bone spur surgery Bilateral    x 3   CARDIOVASCULAR STRESS TEST  02/08/2014   CATARACT EXTRACTION Bilateral    COLONOSCOPY     CYST EXCISION Left 09/29/2018   Procedure: LEFT MIDDLE FINGER CYST REMOVAL AND DEBRIDEMENT OF DISTAL INTERPHALANGEAL JOINT;  Surgeon: Daryll Brod, MD;  Location: [place];  Service: Orthopedics;  Laterality: Left;   FINGER SURGERY Left    left third finger mucoid cyst removed   FOOT NEUROMA SURGERY Bilateral    HYSTEROSCOPY     D&C PMB Endo Cx Polyp   KNEE ARTHROSCOPY WITH MENISCAL REPAIR Right 2016   --Flossie Dibble, MD   LAPAROSCOPIC HYSTERECTOMY  7/209/10   R-TLH/BSO  Fibroids, BAck Pain, Adenomyosis   LUMBAR FUSION     removal vaginal septum     TOTAL KNEE ARTHROPLASTY Right 05/17/2019   Procedure: TOTAL KNEE ARTHROPLASTY;  Surgeon: Gaynelle Arabian, MD;  Location: [facility];  Service: Orthopedics;  Laterality: Right;  29mn   TOTAL KNEE ARTHROPLASTY Left 07/29/2022   Procedure: TOTAL KNEE ARTHROPLASTY;  Surgeon: AGaynelle Arabian MD;  Location: [facility];  Service: Orthopedics;  Laterality: Left;   TUBAL LIGATION     Family History  Problem Relation Age of Onset  Heart failure Father        Possibly related to EtOH   Alcohol abuse Father    Emphysema Father    Emphysema Mother    Heart failure Mother        Possibly related to EtOH   Diabetes Mother    Alcohol abuse Mother    Valvular heart disease Brother    Bipolar disorder Brother    Crohn's disease Daughter    Thyroid disease Daughter    Colon cancer Neg Hx    Stomach cancer Neg Hx    Esophageal cancer Neg Hx    Rectal cancer Neg Hx    Liver cancer Neg Hx    Stroke Neg Hx     Social History   Tobacco Use   Smoking status: Former    Packs/day: 1.00    Years: 20.00    Total pack years: 20.00    Types: Cigarettes    Quit date: 09/24/1979    Years since quitting: 42.9   Smokeless tobacco: Never   Substance Use Topics   Alcohol use: Not Currently    Alcohol/week: 1.0 standard drink of alcohol    Types: 1 Standard drinks or equivalent per week    Marital status: Married   ROS   Review of Systems  Cardiovascular:  Negative for chest pain, claudication, dyspnea on exertion, leg swelling, near-syncope, orthopnea, palpitations, paroxysmal nocturnal dyspnea and syncope.  Respiratory:  Negative for shortness of breath.   Hematologic/Lymphatic: Does not bruise/bleed easily.  Gastrointestinal:  Negative for melena.  Neurological:  Negative for dizziness and weakness.   Objective:  Blood pressure 110/67, pulse 91, height _0  (1.575 m), weight 164 lb 3.2 oz (74.5 kg), SpO2 97 %. Body mass index is 30.03 kg/m.      09/03/2022   11:07 AM 07/30/2022    9:48 AM 07/30/2022    6:48 AM  Vitals with BMI  Height _1     Weight 164 lbs 3 oz    BMI 25.42    Systolic 706 237 628  Diastolic 67 85 70  Pulse 91 67 64     No data found.  Physical Exam Vitals reviewed.  Constitutional:      Appearance: She is well-developed.     Comments: Moderately obese  Cardiovascular:     Rate and Rhythm: Normal rate and regular rhythm.     Pulses: Intact distal pulses.          Carotid pulses are 2+ on the right side with bruit and 2+ on the left side with bruit.      Radial pulses are 2+ on the right side and 2+ on the left side.       Dorsalis pedis pulses are 2+ on the right side and 2+ on the left side.       Posterior tibial pulses are 1+ on the right side and 1+ on the left side.     Heart sounds: S1 normal and S2 normal. Murmur heard.     High-pitched decrescendo early diastolic murmur is present with a grade of 2/4 at the upper right sternal border.     No gallop.     Comments: No JVD. Pulmonary:     Effort: Pulmonary effort is normal. No accessory muscle usage or respiratory distress.     Breath sounds: Normal breath sounds. No wheezing, rhonchi or rales.  Musculoskeletal:     Right  lower leg: No edema.     Left lower leg:  No edema.   Physical exam unchanged compared to previous office visit.  Laboratory examination:       Latest Ref Rng & Units 07/30/2022    4:13 AM 07/19/2022   10:52 AM 03/22/2021   11:15 AM  CMP  Glucose 70 - 99 mg/dL 159  104  100   BUN 8 - 23 mg/dL _0 Creatinine 0.44 - 1.00 mg/dL 1.26  1.41  1.12   Sodium 135 - 145 mmol/L 142  142  143   Potassium 3.5 - 5.1 mmol/L 4.2  4.2  4.0   Chloride 98 - 111 mmol/L 113  106  105   CO2 22 - 32 mmol/L _1 Calcium 8.9 - 10.3 mg/dL 8.5  9.4  10.0   Total Protein 6.5 - 8.1 g/dL   7.2   Total Bilirubin 0.3 - 1.2 mg/dL   0.4   Alkaline Phos 38 - 126 U/L   103   AST 15 - 41 U/L   17   ALT 0 - 44 U/L   17       Latest Ref Rng & Units 07/30/2022    4:13 AM 07/19/2022   10:52 AM 03/22/2021   11:15 AM  CBC  WBC 4.0 - 10.5 K/uL 11.9  6.7  5.8   Hemoglobin 12.0 - 15.0 g/dL 11.0  13.9  14.6   Hematocrit 36.0 - 46.0 % 34.7  43.5  46.8   Platelets 150 - 400 K/uL 208  299  284    Lipid Panel No results for input(s): "CHOL", "TRIG", "[place]", "VLDL", "HDL", "CHOLHDL", "LDLDIRECT" in the last 8760 hours.    HEMOGLOBIN A1C Lab Results  Component Value Date   HGBA1C 5.8 (H) 07/19/2022   MPG 119.76 07/19/2022   TSH No results for input(s): "TSH" in the last 8760 hours.    Vitamin D 1, 25 (OH)2 Total 12/28/2019 pg/mL 34     External labs:  05/07/2019:  Creatinine 1.1, EGFR 49/59, potassium 4.1, BMP normal.  Normal H&H, MCH low at 25.5, MCHC 30.0, CBC otherwise normal.  Cholesterol 203, triglycerides 107, HDL 64, LDL 118. TSH normal.   Allergies   Allergies  Allergen Reactions   Amlodipine Other (See Comments) and Swelling    Legs swell some and she doesn't like it.   Fenofibrate Other (See Comments)    Leg pain   Omeprazole Other (See Comments)    Unknown   Simvastatin Other (See Comments)    Myalgia, leg pains   Singulair [Montelukast Sodium] Other (See Comments)     Affected mood   Statins Other (See Comments)    Myalgia, leg pains    Medications Prior to Visit:   Outpatient Medications Prior to Visit  Medication Sig Dispense Refill   acetaminophen (TYLENOL) 650 MG CR tablet Take 1,300 mg by mouth every 8 (eight) hours as needed for pain.      albuterol (VENTOLIN HFA) 108 (90 Base) MCG/ACT inhaler Inhale 1-2 puffs into the lungs every 6 (six) hours as needed for wheezing or shortness of breath.      ALPRAZolam (XANAX) 0.25 MG tablet Take 0.25 mg by mouth daily as needed for anxiety.     budesonide-formoterol (SYMBICORT) 160-4.5 MCG/ACT inhaler Inhale 2 puffs into the lungs daily as needed (shortness of breath).     buPROPion (WELLBUTRIN XL) 150 MG 24 hr tablet Take 150 mg by mouth daily.     CALCIUM-VITAMIN D PO  Take 1 tablet by mouth daily.     cetirizine (ZYRTEC) 10 MG tablet Take 10 mg by mouth daily.     clopidogrel (PLAVIX) 75 MG tablet TAKE 1 TABLET BY MOUTH EVERY DAY 90 tablet 3   furosemide (LASIX) 20 MG tablet Take 10 mg by mouth daily.     gabapentin (NEURONTIN) 300 MG capsule Take 300 mg by mouth at bedtime.     isosorbide mononitrate (IMDUR) 60 MG 24 hr tablet Take 1 tablet (60 mg total) by mouth daily. 90 tablet 3   magnesium gluconate (MAGONATE) 500 MG tablet Take 500 mg by mouth daily.     methocarbamol (ROBAXIN) 500 MG tablet Take 1 tablet (500 mg total) by mouth every 6 (six) hours as needed for muscle spasms. 40 tablet 0   metoprolol succinate (TOPROL-XL) 25 MG 24 hr tablet Take 1 tablet (25 mg total) by mouth daily. Take with or immediately following a meal. 30 tablet 3   Multiple Vitamins-Minerals (MULTIVITAMIN PO) Take 1 tablet by mouth daily.     nitroGLYCERIN (NITROSTAT) 0.4 MG SL tablet Place 1 tablet (0.4 mg total) under the tongue every 5 (five) minutes as needed for chest pain. 30 tablet 2   NON FORMULARY CPAP     olmesartan (BENICAR) 40 MG tablet Take 40 mg by mouth daily.     oxyCODONE (OXY IR/ROXICODONE) 5 MG immediate  release tablet Take 1-2 tablets (5-10 mg total) by mouth every 6 (six) hours as needed for moderate pain or severe pain. 42 tablet 0   OXYGEN Inhale 3 L into the lungs at bedtime.     pantoprazole (PROTONIX) 40 MG tablet Take 1 tablet (40 mg total) by mouth daily. (Patient taking differently: Take 40 mg by mouth 2 (two) times daily.) 90 tablet 3   PARoxetine (PAXIL) 40 MG tablet Take 40 mg by mouth every evening.      potassium chloride (KLOR-CON) 10 MEQ tablet Take 10 mEq by mouth daily.     PRALUENT 150 MG/ML SOAJ INJECT 1 SYRINGE INTO THE SKIN EVERY 14 (FOURTEEN) DAYS. 6 mL 3   tirzepatide (MOUNJARO) 10 MG/0.5ML Pen Inject 1 pen (10 MG) into the skin once a week 2 mL 5   traZODone (DESYREL) 150 MG tablet Take 150 mg by mouth at bedtime.     vitamin B-12 (CYANOCOBALAMIN) 1000 MCG tablet Take 1,000 mcg by mouth daily.     ondansetron (ZOFRAN) 4 MG tablet Take 1 tablet (4 mg total) by mouth every 6 (six) hours. (Patient not taking: Reported on 09/03/2022) 12 tablet 0   No facility-administered medications prior to visit.   Final Medications at End of Visit    Current Meds  Medication Sig   acetaminophen (TYLENOL) 650 MG CR tablet Take 1,300 mg by mouth every 8 (eight) hours as needed for pain.    albuterol (VENTOLIN HFA) 108 (90 Base) MCG/ACT inhaler Inhale 1-2 puffs into the lungs every 6 (six) hours as needed for wheezing or shortness of breath.    ALPRAZolam (XANAX) 0.25 MG tablet Take 0.25 mg by mouth daily as needed for anxiety.   budesonide-formoterol (SYMBICORT) 160-4.5 MCG/ACT inhaler Inhale 2 puffs into the lungs daily as needed (shortness of breath).   buPROPion (WELLBUTRIN XL) 150 MG 24 hr tablet Take 150 mg by mouth daily.   CALCIUM-VITAMIN D PO Take 1 tablet by mouth daily.   cetirizine (ZYRTEC) 10 MG tablet Take 10 mg by mouth daily.   clopidogrel (PLAVIX) 75 MG tablet TAKE  1 TABLET BY MOUTH EVERY DAY   furosemide (LASIX) 20 MG tablet Take 10 mg by mouth daily.   gabapentin  (NEURONTIN) 300 MG capsule Take 300 mg by mouth at bedtime.   isosorbide mononitrate (IMDUR) 60 MG 24 hr tablet Take 1 tablet (60 mg total) by mouth daily.   magnesium gluconate (MAGONATE) 500 MG tablet Take 500 mg by mouth daily.   methocarbamol (ROBAXIN) 500 MG tablet Take 1 tablet (500 mg total) by mouth every 6 (six) hours as needed for muscle spasms.   metoprolol succinate (TOPROL-XL) 25 MG 24 hr tablet Take 1 tablet (25 mg total) by mouth daily. Take with or immediately following a meal.   Multiple Vitamins-Minerals (MULTIVITAMIN PO) Take 1 tablet by mouth daily.   nitroGLYCERIN (NITROSTAT) 0.4 MG SL tablet Place 1 tablet (0.4 mg total) under the tongue every 5 (five) minutes as needed for chest pain.   NON FORMULARY CPAP   olmesartan (BENICAR) 40 MG tablet Take 40 mg by mouth daily.   oxyCODONE (OXY IR/ROXICODONE) 5 MG immediate release tablet Take 1-2 tablets (5-10 mg total) by mouth every 6 (six) hours as needed for moderate pain or severe pain.   OXYGEN Inhale 3 L into the lungs at bedtime.   pantoprazole (PROTONIX) 40 MG tablet Take 1 tablet (40 mg total) by mouth daily. (Patient taking differently: Take 40 mg by mouth 2 (two) times daily.)   PARoxetine (PAXIL) 40 MG tablet Take 40 mg by mouth every evening.    potassium chloride (KLOR-CON) 10 MEQ tablet Take 10 mEq by mouth daily.   PRALUENT 150 MG/ML SOAJ INJECT 1 SYRINGE INTO THE SKIN EVERY 14 (FOURTEEN) DAYS.   tirzepatide (MOUNJARO) 10 MG/0.5ML Pen Inject 1 pen (10 MG) into the skin once a week   traZODone (DESYREL) 150 MG tablet Take 150 mg by mouth at bedtime.   vitamin B-12 (CYANOCOBALAMIN) 1000 MCG tablet Take 1,000 mcg by mouth daily.   Radiology  No results found.  MR angio of the neck and head 03/15/2020 without contrast: 1. No intracranial large vessel occlusion or proximal high-grade arterial stenosis. 2. 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 3. Acute right paramedian  pontine infarct.  CT angio chest, abdomen, pelvis 08/13/2020: 1. No acute aortic abnormality. 2. Mild to moderate aortic atherosclerosis. 3. 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 4. 2 cm slightly hyperdense lesion off the lower pole of the right kidney which could represent a hemorrhagic/proteinaceous cyst. If further evaluation is required would recommend dedicated renal ultrasound. 5.  Aortic Atherosclerosis (ICD10-I70.0).  Chest x-ray 08/13/2020: No active cardiopulmonary disease  Cardiac Studies:   Coronary CTA 03/30/2019:  1. Coronary calcium score of 33. This was 60 percentile for age and sex matched control. 2. Normal coronary origin with left dominance. 3. Moderate plaque with stenosis 50-69% in the mid LAD. CAD RADS 3. Additional analysis with CT FFR will be submitted.  Coronary CTA FFR 03/30/2019:  1. Left Main: No significant stenosis. 2. LAD: No significant stenosis. 3. LCX: No significant stenosis. 4. RCA: No significant stenosis. IMPRESSION: 1.  CT FFR analysis didn't show any significant stenosis.  Echocardiogram 03/15/2020  1. Technically difficult echo with poor image quality.   2. Left ventricular ejection fraction, by estimation, is 65 to 70%. The  left ventricle has normal function. The left ventricle has no regional  wall motion abnormalities. Left ventricular diastolic parameters are  consistent with Grade I diastolic dysfunction (  impaired relaxation).   3. Right ventricular systolic function is normal. The right ventricular  size is normal.   4. The mitral valve is grossly normal. No evidence of mitral valve  regurgitation.   5. The aortic valve is grossly normal. Aortic valve regurgitation is not  visualized. No aortic stenosis is present.  Compared to 04/23/2019, moderate aortic regurgitation could not be visualized due to poor echo window.  Otherwise no significant change  Carotid duplex 08/22/2020: Minimal stenosis in the right  internal carotid artery (1-15%). Stenosis in the right external carotid artery (>50%).  Minimal stenosis in the left internal carotid artery (1-15%). Stenosis in the left external carotid artery (<50%).  Antegrade right vertebral artery flow. Antegrade left vertebral artery flow.  External carotid stenosis may be the source of bruit.  Follow up studies is appropriate if clinically indicated.  PCV MYOCARDIAL PERFUSION WO LEXISCAN 08/21/2020 Normal ECG stress. The patient exercised for 5 minutes and 0 seconds of a Bruce protocol, achieving approximately 6.83 METs.  Normal blood pressure response.  Accelerated heart rate response suggest aerobic intolerance.  Stress terminated due to dyspnea and fatigue. Myocardial perfusion: There is mild breast tissue attenuation inferiorly. There is no ischemia or scar. Overall LV systolic function is abnormal without regional wall motion abnormalities. Stress LV EF: 47%. Visually appears normal. The LV size was small and may be contributing to calculation of LVEF error. No previous exam available for comparison. Low risk.  PCV ECHOCARDIOGRAM COMPLETE 49/44/9675 Normal LV systolic function with EF 58%. Left ventricle cavity is normal in size. Normal left ventricular wall thickness. Normal global wall motion. Doppler evidence of grade I (impaired) diastolic dysfunction, normal LAP. Calculated EF 58%. Trileaflet aortic valve. No evidence of aortic stenosis. Mild (Grade I) aortic regurgitation. Mild aortic valve leaflet thickening. Structurally normal mitral valve. No evidence of mitral stenosis. Mild (Grade I) mitral regurgitation. No significant change from 04/23/2019.  EKG  03/05/2022: Sinus rhythm at a rate of 75 bpm.  Normal axis.  No evidence of ischemia or underlying injury pattern.  09/05/2021: Sinus rhythm at a rate of 71 bpm.  Left atrial enlargement.  Normal axis.  Poor progression, cannot exclude anteroseptal infarct old.  Compared to EKG 03/06/2021, PRWP  see again as in previous EKGs.   09/03/2022: sinus rhythm. Normal axis, normal R wave progression. Unchanged from prior  Assessment:     ICD-10-CM   1. Coronary artery disease of native artery of native heart with stable angina pectoris ([place])  I25.118 EKG 12-Lead    2. Essential hypertension  I10     3. Hypercholesteremia  E78.00      No orders of the defined types were placed in this encounter.  Medications Discontinued During This Encounter  Medication Reason   ondansetron (ZOFRAN) 4 MG tablet Completed Course     Recommendations:   Shelley Spencer  is a 76 y.o. female  with hypertension, hyperlipidemia, hyperglycemia, CKD stage 3, former tobacco use with moderate CAD with LAD 50-60% stenosis by coronary CTA on 03/30/2019  (not significant stenosis by FFR), moderate AR by echo in July 2020.  She is a former smoker that quit in 1981.   Coronary artery disease of native artery of native heart with stable angina pectoris (HCC) Continue Plavix, BB, Imdur Has not used SL nitro EKG unchanged   Essential hypertension Continue current cardiac medications. Encourage low-sodium diet, less than 2000 mg daily. BP is very well controlled now   Hypercholesteremia Tolerating Praluent Patient is statin  intolerant   She has upcoming physical with PCP, will defer laboratory testing. Follow-up in 12 months, sooner if needed.   Shelley Flock, DO, [facility] 09/06/2022, 11:16 AM Office: [phone]

## 2022-09-04 ENCOUNTER — Ambulatory Visit: Payer: Medicare Other | Admitting: Internal Medicine

## 2022-09-04 ENCOUNTER — Ambulatory Visit: Payer: Medicare Other | Admitting: Student

## 2022-09-08 ENCOUNTER — Other Ambulatory Visit: Payer: Self-pay | Admitting: Cardiology

## 2022-09-08 DIAGNOSIS — E7800 Pure hypercholesterolemia, unspecified: Secondary | ICD-10-CM

## 2022-09-08 DIAGNOSIS — I2510 Atherosclerotic heart disease of native coronary artery without angina pectoris: Secondary | ICD-10-CM

## 2022-09-08 DIAGNOSIS — Z789 Other specified health status: Secondary | ICD-10-CM

## 2022-09-18 ENCOUNTER — Other Ambulatory Visit (HOSPITAL_COMMUNITY): Payer: Self-pay

## 2022-10-31 ENCOUNTER — Other Ambulatory Visit (HOSPITAL_COMMUNITY): Payer: Self-pay

## 2022-11-05 ENCOUNTER — Other Ambulatory Visit (HOSPITAL_COMMUNITY): Payer: Self-pay

## 2022-11-05 MED ORDER — MOUNJARO 15 MG/0.5ML ~~LOC~~ SOAJ
SUBCUTANEOUS | 11 refills | Status: DC
  Filled 2022-11-05: qty 2, 28d supply, fill #0
  Filled 2022-12-24 – 2023-01-20 (×2): qty 2, 28d supply, fill #1
  Filled 2023-02-22: qty 6, 90d supply, fill #2
  Filled 2023-02-22 – 2023-03-04 (×2): qty 2, 28d supply, fill #2
  Filled 2023-04-02 – 2023-04-10 (×2): qty 2, 28d supply, fill #3
  Filled 2023-05-18: qty 2, 28d supply, fill #4
  Filled 2023-06-16: qty 2, 28d supply, fill #5
  Filled 2023-07-28: qty 2, 28d supply, fill #6
  Filled 2023-07-31: qty 2, 28d supply, fill #0
  Filled 2023-08-26: qty 2, 28d supply, fill #1
  Filled 2023-09-30: qty 2, 28d supply, fill #2

## 2022-11-07 ENCOUNTER — Encounter (HOSPITAL_BASED_OUTPATIENT_CLINIC_OR_DEPARTMENT_OTHER): Payer: Self-pay

## 2022-11-07 ENCOUNTER — Emergency Department (HOSPITAL_BASED_OUTPATIENT_CLINIC_OR_DEPARTMENT_OTHER): Payer: Medicare Other

## 2022-11-07 ENCOUNTER — Emergency Department (HOSPITAL_BASED_OUTPATIENT_CLINIC_OR_DEPARTMENT_OTHER)
Admission: EM | Admit: 2022-11-07 | Discharge: 2022-11-07 | Disposition: A | Discharge status: Home / Self Care | Payer: Medicare Other | Attending: Emergency Medicine | Admitting: Emergency Medicine

## 2022-11-07 ENCOUNTER — Other Ambulatory Visit: Payer: Self-pay

## 2022-11-07 DIAGNOSIS — I2510 Atherosclerotic heart disease of native coronary artery without angina pectoris: Secondary | ICD-10-CM | POA: Insufficient documentation

## 2022-11-07 DIAGNOSIS — S51811A Laceration without foreign body of right forearm, initial encounter: Secondary | ICD-10-CM | POA: Diagnosis not present

## 2022-11-07 DIAGNOSIS — Z79899 Other long term (current) drug therapy: Secondary | ICD-10-CM | POA: Insufficient documentation

## 2022-11-07 DIAGNOSIS — I10 Essential (primary) hypertension: Secondary | ICD-10-CM | POA: Diagnosis not present

## 2022-11-07 DIAGNOSIS — S0083XA Contusion of other part of head, initial encounter: Secondary | ICD-10-CM | POA: Diagnosis not present

## 2022-11-07 DIAGNOSIS — W19XXXA Unspecified fall, initial encounter: Secondary | ICD-10-CM | POA: Insufficient documentation

## 2022-11-07 DIAGNOSIS — S59911A Unspecified injury of right forearm, initial encounter: Secondary | ICD-10-CM | POA: Diagnosis present

## 2022-11-07 MED ORDER — HYDRALAZINE HCL 25 MG PO TABS
25.0000 mg | ORAL_TABLET | Freq: Once | ORAL | Status: AC
  Administered 2022-11-07: 25 mg via ORAL
  Filled 2022-11-07: qty 1

## 2022-11-07 MED ORDER — ACETAMINOPHEN 325 MG PO TABS
650.0000 mg | ORAL_TABLET | Freq: Once | ORAL | Status: AC
  Administered 2022-11-07: 650 mg via ORAL
  Filled 2022-11-07: qty 2

## 2022-11-07 NOTE — ED Notes (Signed)
RN reviewed discharge instructions with pt. Pt verbalized understanding and had no further questions. VSS upon discharge.  

## 2022-11-07 NOTE — ED Provider Notes (Signed)
[facility] Provider Note   CSN: [id number] Arrival date & time: 11/07/22  1553     History  Chief Complaint  Patient presents with   Shelley Spencer is a 77 y.o. female. She has past medical history of hypertension, sleep apnea, CAD.  She is on Plavix.  She presents to the emergency department today for head injury.  She was attempting to put her grandsons car seat in her car.  When she leaned over to adjust the strap she continue following and hit the left side of her forehead on the pavement.  She denies loss of consciousness but has headache and nausea.  No numbness tingling or weakness, no neck pain, denies chest pain or shortness of breath, denies dizziness or syncope.  She has significant swelling to the left forehead and eye.  Fall       Home Medications Prior to Admission medications   Medication Sig Start Date End Date Taking? Authorizing Provider  acetaminophen (TYLENOL) 650 MG CR tablet Take 1,300 mg by mouth every 8 (eight) hours as needed for pain.     [provider]  albuterol (VENTOLIN HFA) 108 (90 Base) MCG/ACT inhaler Inhale 1-2 puffs into the lungs every 6 (six) hours as needed for wheezing or shortness of breath.     [provider]  Alirocumab (PRALUENT) 150 MG/ML SOAJ INJECT 1 SYRINGE INTO THE SKIN EVERY 14 (FOURTEEN) DAYS. 09/09/22   Adrian Prows, MD  ALPRAZolam Duanne Moron) 0.25 MG tablet Take 0.25 mg by mouth daily as needed for anxiety. 12/21/20   [provider]  budesonide-formoterol (SYMBICORT) 160-4.5 MCG/ACT inhaler Inhale 2 puffs into the lungs daily as needed (shortness of breath).    [provider]  buPROPion (WELLBUTRIN XL) 150 MG 24 hr tablet Take 150 mg by mouth daily.    [provider]  CALCIUM-VITAMIN D PO Take 1 tablet by mouth daily.    [provider]  cetirizine (ZYRTEC) 10 MG tablet Take 10 mg by mouth daily.    [provider]   clopidogrel (PLAVIX) 75 MG tablet TAKE 1 TABLET BY MOUTH EVERY DAY 10/24/21   Lomax, Amy, NP  furosemide (LASIX) 20 MG tablet Take 10 mg by mouth daily. 02/19/20   [provider]  gabapentin (NEURONTIN) 300 MG capsule Take 300 mg by mouth at bedtime. 03/02/21   [provider]  isosorbide mononitrate (IMDUR) 60 MG 24 hr tablet Take 1 tablet (60 mg total) by mouth daily. 09/05/21   Cantwell, Denham Mose C, PA-C  KLOR-CON M10 10 MEQ tablet Take 10 mEq by mouth daily. 10/08/22   [provider]  magnesium gluconate (MAGONATE) 500 MG tablet Take 500 mg by mouth daily.    [provider]  methocarbamol (ROBAXIN) 500 MG tablet Take 1 tablet (500 mg total) by mouth every 6 (six) hours as needed for muscle spasms. 07/30/22   Edmisten, Kristie L, PA  metoprolol succinate (TOPROL-XL) 25 MG 24 hr tablet Take 1 tablet (25 mg total) by mouth daily. Take with or immediately following a meal. 03/05/22 09/03/22  Cantwell, Yaneliz Radebaugh C, PA-C  Multiple Vitamins-Minerals (MULTIVITAMIN PO) Take 1 tablet by mouth daily.    [provider]  nitroGLYCERIN (NITROSTAT) 0.4 MG SL tablet Place 1 tablet (0.4 mg total) under the tongue every 5 (five) minutes as needed for chest pain. 08/14/20   Patwardhan, Reynold Bowen, MD  NON FORMULARY CPAP    [provider]  olmesartan (BENICAR) 40 MG tablet Take 40 mg by mouth daily.    [provider]  oxyCODONE (OXY IR/ROXICODONE) 5 MG immediate release tablet Take 1-2 tablets (5-10 mg total) by mouth every 6 (six) hours as needed for moderate pain or severe pain. 07/30/22   Edmisten, Kristie L, PA  OXYGEN Inhale 3 L into the lungs at bedtime.    [provider]  pantoprazole (PROTONIX) 40 MG tablet Take 1 tablet (40 mg total) by mouth daily. Patient taking differently: Take 40 mg by mouth 2 (two) times daily. 04/18/21   Levin Erp, PA  PARoxetine (PAXIL) 40 MG tablet Take 40 mg by mouth every evening.     [provider]  potassium chloride (KLOR-CON) 10 MEQ tablet Take 10 mEq by mouth daily.    [provider]  tirzepatide Darcel Bayley) 10 MG/0.5ML Pen Inject 1 pen (10 MG) into the skin once a week 06/05/22     tirzepatide [facility]) 15 MG/0.5ML Pen Inject 78m under the skin once weekly 11/05/22     traZODone (DESYREL) 150 MG tablet Take 150 mg by mouth at bedtime.    [provider]  vitamin B-12 (CYANOCOBALAMIN) 1000 MCG tablet Take 1,000 mcg by mouth daily.    [provider]      Allergies    Amlodipine, Fenofibrate, Omeprazole, Simvastatin, Singulair [montelukast sodium], and Statins    Review of Systems   Review of Systems  Physical Exam Updated Vital Signs BP (!) 178/88   Pulse 78   Temp 97.8 F (36.6 C) (Oral)   Resp 16   Ht 5' 2"$  (1.575 m)   Wt 68.9 kg   LMP  (LMP Unknown)   SpO2 92%   BMI 27.80 kg/m  Physical Exam Vitals and nursing note reviewed.  Constitutional:      General: She is not in acute distress.    Appearance: She is well-developed.  HENT:     Head:     Comments: Hematoma right upper eyelid, and right forehead- eye swollen shut Eyes:     Extraocular Movements: Extraocular movements intact.     Conjunctiva/sclera: Conjunctivae normal.     Pupils: Pupils are equal, round, and reactive to light.     Comments: Left thigh patient reports grossly normal vision when eyelid is manually retracted by me no ocular pain  Cardiovascular:     Rate and Rhythm: Normal rate and regular rhythm.     Heart sounds: No murmur heard. Pulmonary:     Effort: Pulmonary effort is normal. No respiratory distress.     Breath sounds: Normal breath sounds.  Abdominal:     Palpations: Abdomen is soft.     Tenderness: There is no abdominal tenderness.  Musculoskeletal:        General: No swelling.     Cervical back: Neck supple. No tenderness.  Skin:    General: Skin is warm and dry.     Capillary Refill: Capillary refill takes less than 2 seconds.   Neurological:     Mental Status: She is alert.  Psychiatric:        Mood and Affect: Mood normal.     ED Results / Procedures / Treatments   Labs (all labs ordered are listed, but only abnormal results are displayed) Labs Reviewed - No data to display  EKG EKG Interpretation  Date/Time:  Thursday November 07 2022 16:47:45 EST Ventricular Rate:  78 PR Interval:  159 QRS Duration: 96 QT Interval:  405 QTC  Calculation: 462 R Axis:   23 Text Interpretation: Sinus rhythm Low voltage, precordial leads Abnormal R-wave progression, early transition Confirmed by Margaretmary Eddy [phone]) on 11/07/2022 5:03:41 PM  Radiology CT Head Wo Contrast  Result Date: 11/07/2022 CLINICAL DATA:  Mechanical fall takes Plavix EXAM: CT HEAD WITHOUT CONTRAST CT MAXILLOFACIAL WITHOUT CONTRAST CT CERVICAL SPINE WITHOUT CONTRAST TECHNIQUE: Multidetector CT imaging of the head, cervical spine, and maxillofacial structures were performed using the standard protocol without intravenous contrast. Multiplanar CT image reconstructions of the cervical spine and maxillofacial structures were also generated. RADIATION DOSE REDUCTION: This exam was performed according to the departmental dose-optimization program which includes automated exposure control, adjustment of the mA and/or kV according to patient size and/or use of iterative reconstruction technique. COMPARISON:  CT brain and neck 03/22/2021, MRI 09/12/2020 FINDINGS: CT HEAD FINDINGS Brain: No acute territorial infarction, hemorrhage or intracranial mass. Mild chronic small vessel ischemic changes of the white matter. Mild atrophy. Nonenlarged ventricles. Chronic pontine infarct. Vascular: No hyperdense vessels.  Carotid vascular calcification Skull: Normal. Negative for fracture or focal lesion. Other: Large left forehead and periorbital hematoma CT MAXILLOFACIAL FINDINGS Osseous: Mastoid air cells are clear. Mandibular heads are normally position. No mandibular  fracture. Pterygoid plates and zygomatic arches are intact. No acute nasal bone fracture Orbits: Negative. No traumatic or inflammatory finding. Bilateral lens extraction Sinuses: Clear. Soft tissues: Large left forehead and periorbital hematoma CT CERVICAL SPINE FINDINGS Alignment: Craniovertebral junction is intact. Facet alignment normal. Straightening of the cervical spine. Trace anterolisthesis C7 on T1. Skull base and vertebrae: Vertebral body heights are maintained. No acute fracture is seen. Chronic deformity at the spinous process of C7. Soft tissues and spinal canal: No prevertebral fluid or swelling. No visible canal hematoma. Disc levels: Anterior fusion hardware C4 through C7. Moderate degenerative change at C7-T1. Facet degenerative changes at multiple levels. Upper chest: Negative. Other: None IMPRESSION: 1. No CT evidence for acute intracranial abnormality. Atrophy and chronic small vessel ischemic changes of the white matter. 2. No acute facial bone fracture. Large left forehead and periorbital hematoma. 3. Straightening of the cervical spine with postsurgical changes C4 through C7. No acute osseous abnormality. Electronically Signed   By: Donavan Foil M.D.   On: 11/07/2022 17:42   CT Cervical Spine Wo Contrast  Result Date: 11/07/2022 CLINICAL DATA:  Mechanical fall takes Plavix EXAM: CT HEAD WITHOUT CONTRAST CT MAXILLOFACIAL WITHOUT CONTRAST CT CERVICAL SPINE WITHOUT CONTRAST TECHNIQUE: Multidetector CT imaging of the head, cervical spine, and maxillofacial structures were performed using the standard protocol without intravenous contrast. Multiplanar CT image reconstructions of the cervical spine and maxillofacial structures were also generated. RADIATION DOSE REDUCTION: This exam was performed according to the departmental dose-optimization program which includes automated exposure control, adjustment of the mA and/or kV according to patient size and/or use of iterative reconstruction  technique. COMPARISON:  CT brain and neck 03/22/2021, MRI 09/12/2020 FINDINGS: CT HEAD FINDINGS Brain: No acute territorial infarction, hemorrhage or intracranial mass. Mild chronic small vessel ischemic changes of the white matter. Mild atrophy. Nonenlarged ventricles. Chronic pontine infarct. Vascular: No hyperdense vessels.  Carotid vascular calcification Skull: Normal. Negative for fracture or focal lesion. Other: Large left forehead and periorbital hematoma CT MAXILLOFACIAL FINDINGS Osseous: Mastoid air cells are clear. Mandibular heads are normally position. No mandibular fracture. Pterygoid plates and zygomatic arches are intact. No acute nasal bone fracture Orbits: Negative. No traumatic or inflammatory finding. Bilateral lens extraction Sinuses: Clear. Soft tissues: Large left forehead and periorbital hematoma CT CERVICAL  SPINE FINDINGS Alignment: Craniovertebral junction is intact. Facet alignment normal. Straightening of the cervical spine. Trace anterolisthesis C7 on T1. Skull base and vertebrae: Vertebral body heights are maintained. No acute fracture is seen. Chronic deformity at the spinous process of C7. Soft tissues and spinal canal: No prevertebral fluid or swelling. No visible canal hematoma. Disc levels: Anterior fusion hardware C4 through C7. Moderate degenerative change at C7-T1. Facet degenerative changes at multiple levels. Upper chest: Negative. Other: None IMPRESSION: 1. No CT evidence for acute intracranial abnormality. Atrophy and chronic small vessel ischemic changes of the white matter. 2. No acute facial bone fracture. Large left forehead and periorbital hematoma. 3. Straightening of the cervical spine with postsurgical changes C4 through C7. No acute osseous abnormality. Electronically Signed   By: Donavan Foil M.D.   On: 11/07/2022 17:42   CT Maxillofacial Wo Contrast  Result Date: 11/07/2022 CLINICAL DATA:  Mechanical fall takes Plavix EXAM: CT HEAD WITHOUT CONTRAST CT  MAXILLOFACIAL WITHOUT CONTRAST CT CERVICAL SPINE WITHOUT CONTRAST TECHNIQUE: Multidetector CT imaging of the head, cervical spine, and maxillofacial structures were performed using the standard protocol without intravenous contrast. Multiplanar CT image reconstructions of the cervical spine and maxillofacial structures were also generated. RADIATION DOSE REDUCTION: This exam was performed according to the departmental dose-optimization program which includes automated exposure control, adjustment of the mA and/or kV according to patient size and/or use of iterative reconstruction technique. COMPARISON:  CT brain and neck 03/22/2021, MRI 09/12/2020 FINDINGS: CT HEAD FINDINGS Brain: No acute territorial infarction, hemorrhage or intracranial mass. Mild chronic small vessel ischemic changes of the white matter. Mild atrophy. Nonenlarged ventricles. Chronic pontine infarct. Vascular: No hyperdense vessels.  Carotid vascular calcification Skull: Normal. Negative for fracture or focal lesion. Other: Large left forehead and periorbital hematoma CT MAXILLOFACIAL FINDINGS Osseous: Mastoid air cells are clear. Mandibular heads are normally position. No mandibular fracture. Pterygoid plates and zygomatic arches are intact. No acute nasal bone fracture Orbits: Negative. No traumatic or inflammatory finding. Bilateral lens extraction Sinuses: Clear. Soft tissues: Large left forehead and periorbital hematoma CT CERVICAL SPINE FINDINGS Alignment: Craniovertebral junction is intact. Facet alignment normal. Straightening of the cervical spine. Trace anterolisthesis C7 on T1. Skull base and vertebrae: Vertebral body heights are maintained. No acute fracture is seen. Chronic deformity at the spinous process of C7. Soft tissues and spinal canal: No prevertebral fluid or swelling. No visible canal hematoma. Disc levels: Anterior fusion hardware C4 through C7. Moderate degenerative change at C7-T1. Facet degenerative changes at multiple  levels. Upper chest: Negative. Other: None IMPRESSION: 1. No CT evidence for acute intracranial abnormality. Atrophy and chronic small vessel ischemic changes of the white matter. 2. No acute facial bone fracture. Large left forehead and periorbital hematoma. 3. Straightening of the cervical spine with postsurgical changes C4 through C7. No acute osseous abnormality. Electronically Signed   By: Donavan Foil M.D.   On: 11/07/2022 17:42    Procedures Procedures    Medications Ordered in ED Medications  acetaminophen (TYLENOL) tablet 650 mg (650 mg Oral Given 11/07/22 1651)  hydrALAZINE (APRESOLINE) tablet 25 mg (25 mg Oral Given 11/07/22 1651)    ED Course/ Medical Decision Making/ A&P                             Medical Decision Making This patient presents to the ED for concern of fall with right forehead contusion after mechanical fall, this involves an extensive number of treatment  options, and is a complaint that carries with it a high risk of complications and morbidity.  The differential diagnosis includes fusion, intracranial hemorrhage, skull fracture, orbital fracture, other   Co morbidities that complicate the patient evaluation  Use of Plavix, history of CAD   Additional history obtained:  Additional history obtained from EMR External records from outside source obtained and reviewed including outpatient visits    Imaging Studies ordered:  I ordered imaging studies including the head maxillofacial and cervical spine I independently visualized and interpreted imaging which showed no intracranial hemorrhage, no facial fracture or cervical spine fracture or malalignment I agree with the radiologist interpretation   Cardiac Monitoring: / EKG:  The patient was maintained on a cardiac monitor.  I personally viewed and interpreted the cardiac monitored which showed an underlying rhythm of: Sinus rhythm    Problem List / ED Course / Critical interventions / Medication  management  Fall with left forehead and eye swelling-imaging negative as above, patient had a small skin tear on the right forearm dressed with bacitracin and gauze roll by myself.  She states her tetanus is up-to-date.  She was given Tylenol for pain and ice pack for swelling.  Advised on follow-up and return precautions.  Blood pressure was noted to be very elevated today but patient has no symptoms of this.  She is given hydralazine to decrease it with some effect but likely driven somewhat by pain and anxiety.  She is feeling better and advised to follow close with her primary care doctor regarding this.  Patient reports her fall was clearly mechanical, she lost her balance when trying to secure a car seat in her car.  Get dizzy there is no loss of consciousness, no seizure concern, no syncope palpitations chest pain or other symptoms.  Patient also seen by my attending I ordered medication including Tylenol for pain Reevaluation of the patient after these medicines showed that the patient improved I have reviewed the patients home medicines and have made adjustments as needed      Amount and/or Complexity of Data Reviewed Radiology: ordered.  Risk OTC drugs. Prescription drug management.           Final Clinical Impression(s) / ED Diagnoses Final diagnoses:  Traumatic hematoma of face, initial encounter  Skin tear of right forearm without complication, initial encounter    Rx / DC Orders ED Discharge Orders     None         Darci Current 11/07/22 1913    Fransico Meadow, MD 11/11/22 [phone]

## 2022-11-07 NOTE — ED Notes (Signed)
Patient transported to CT 

## 2022-11-07 NOTE — ED Triage Notes (Signed)
Patient here POV from Home.  Endorses Fall today. Mechanical in that she was leaning adjusting a family members seat when she fell over into the cement. Occurred 30 Minutes ago.  Takes Plavix. No LOC. Substantial Swelling to Left Eye and Forehead.   NAD Noted during Triage. A&Ox4. GCS 15. Ambulatory.

## 2022-11-07 NOTE — Discharge Instructions (Addendum)
You were seen today after a fall and striking the right side of your face.  You have a large hematoma but your CT scans did not show any acute traumatic injury.  You are given some medication because your blood pressure was persistently very elevated.  Follow-up closely with your primary care doctor and come back to the ER if you have severe headache, dizziness passing out or any other worsening symptoms. The cut on your forearm clean and dry.

## 2022-11-28 ENCOUNTER — Telehealth: Payer: Self-pay | Admitting: Family Medicine

## 2022-11-28 ENCOUNTER — Other Ambulatory Visit: Payer: Self-pay

## 2022-11-28 MED ORDER — CLOPIDOGREL BISULFATE 75 MG PO TABS
75.0000 mg | ORAL_TABLET | Freq: Every day | ORAL | 3 refills | Status: DC
  Filled 2023-02-22: qty 90, 90d supply, fill #0
  Filled 2023-04-02 – 2023-05-18 (×3): qty 90, 90d supply, fill #1
  Filled 2023-08-21: qty 90, 90d supply, fill #2

## 2022-11-28 NOTE — Telephone Encounter (Signed)
Pt is requesting a refill for clopidogrel (PLAVIX) 75 MG tablet .  Pharmacy: [facility] [phone] [facility]

## 2022-11-28 NOTE — Telephone Encounter (Signed)
Called pt son Jeneen Rinks) per [facility] and informed him that Amy   recommend that her PCP fill this in future. Looks like they may have called in refill 07/2022? I am happy to help cover it if needed but really should be a med filled by PCP long term.  Pt son stated he would relay message to pt. I tried to call pt several times but she's not accepting call at the moment.

## 2022-11-28 NOTE — Telephone Encounter (Signed)
Amy, looks like we last refilled 10/24/21 #90, 3 refills. OV note did not mention continuing refills. Ok to in refill?   Pt last seen 05/20/22 and next f/u 05/22/23.

## 2022-11-28 NOTE — Telephone Encounter (Signed)
Can you please call pt and relay Shelley Spencer's message to pt?

## 2022-12-24 ENCOUNTER — Other Ambulatory Visit (HOSPITAL_COMMUNITY): Payer: Self-pay

## 2023-01-01 ENCOUNTER — Other Ambulatory Visit (HOSPITAL_BASED_OUTPATIENT_CLINIC_OR_DEPARTMENT_OTHER): Payer: Self-pay

## 2023-01-02 ENCOUNTER — Other Ambulatory Visit (HOSPITAL_BASED_OUTPATIENT_CLINIC_OR_DEPARTMENT_OTHER): Payer: Self-pay

## 2023-01-02 MED ORDER — MOUNJARO 12.5 MG/0.5ML ~~LOC~~ SOAJ
12.5000 mg | SUBCUTANEOUS | 11 refills | Status: DC
  Filled 2023-01-02: qty 2, 28d supply, fill #0
  Filled 2023-03-04: qty 2, 28d supply, fill #1

## 2023-01-02 MED ORDER — MOUNJARO 12.5 MG/0.5ML ~~LOC~~ SOAJ
12.5000 mg | SUBCUTANEOUS | 11 refills | Status: DC
  Filled 2023-01-02: qty 2, 28d supply, fill #0

## 2023-01-04 ENCOUNTER — Other Ambulatory Visit (HOSPITAL_BASED_OUTPATIENT_CLINIC_OR_DEPARTMENT_OTHER): Payer: Self-pay

## 2023-01-06 ENCOUNTER — Other Ambulatory Visit (HOSPITAL_BASED_OUTPATIENT_CLINIC_OR_DEPARTMENT_OTHER): Payer: Self-pay

## 2023-01-10 ENCOUNTER — Other Ambulatory Visit (HOSPITAL_BASED_OUTPATIENT_CLINIC_OR_DEPARTMENT_OTHER): Payer: Self-pay

## 2023-01-20 ENCOUNTER — Other Ambulatory Visit (HOSPITAL_BASED_OUTPATIENT_CLINIC_OR_DEPARTMENT_OTHER): Payer: Self-pay

## 2023-01-20 MED ORDER — PAROXETINE HCL 40 MG PO TABS
40.0000 mg | ORAL_TABLET | Freq: Every day | ORAL | 3 refills | Status: DC
  Filled 2023-04-02: qty 90, 90d supply, fill #0

## 2023-01-20 MED ORDER — FUROSEMIDE 20 MG PO TABS
20.0000 mg | ORAL_TABLET | Freq: Every day | ORAL | 3 refills | Status: DC
  Filled 2023-04-02: qty 90, 90d supply, fill #0

## 2023-01-20 MED ORDER — METOPROLOL SUCCINATE ER 25 MG PO TB24: 25.0000 mg | ORAL_TABLET | Freq: Every day | ORAL | 0 refills | Status: DC

## 2023-01-20 MED ORDER — OLMESARTAN MEDOXOMIL 40 MG PO TABS
40.0000 mg | ORAL_TABLET | Freq: Every evening | ORAL | 1 refills | Status: DC
  Filled 2023-05-18: qty 90, 90d supply, fill #0

## 2023-01-20 MED ORDER — POTASSIUM CHLORIDE ER 10 MEQ PO TBCR
10.0000 meq | EXTENDED_RELEASE_TABLET | Freq: Every day | ORAL | 3 refills | Status: DC
  Filled 2023-04-02: qty 90, 90d supply, fill #0
  Filled 2023-06-20: qty 90, 90d supply, fill #1

## 2023-01-20 MED ORDER — PANTOPRAZOLE SODIUM 40 MG PO TBEC
40.0000 mg | DELAYED_RELEASE_TABLET | Freq: Two times a day (BID) | ORAL | 1 refills | Status: DC
  Filled 2023-06-20: qty 180, 90d supply, fill #0

## 2023-02-05 ENCOUNTER — Other Ambulatory Visit (HOSPITAL_BASED_OUTPATIENT_CLINIC_OR_DEPARTMENT_OTHER): Payer: Self-pay

## 2023-02-05 MED ORDER — AMOXICILLIN-POT CLAVULANATE 875-125 MG PO TABS
1.0000 | ORAL_TABLET | Freq: Two times a day (BID) | ORAL | 0 refills | Status: DC
  Filled 2023-02-05: qty 14, 7d supply, fill #0

## 2023-02-08 ENCOUNTER — Other Ambulatory Visit (HOSPITAL_BASED_OUTPATIENT_CLINIC_OR_DEPARTMENT_OTHER): Payer: Self-pay

## 2023-02-08 MED FILL — Alirocumab Subcutaneous Solution Auto-Injector 150 MG/ML: SUBCUTANEOUS | 28 days supply | Qty: 2 | Fill #0 | Status: AC

## 2023-02-10 ENCOUNTER — Other Ambulatory Visit (HOSPITAL_BASED_OUTPATIENT_CLINIC_OR_DEPARTMENT_OTHER): Payer: Self-pay

## 2023-02-10 MED ORDER — OLMESARTAN MEDOXOMIL 20 MG PO TABS
20.0000 mg | ORAL_TABLET | Freq: Every evening | ORAL | 3 refills | Status: DC
  Filled 2023-02-10: qty 90, 90d supply, fill #0
  Filled 2023-04-02 – 2023-04-10 (×2): qty 90, 90d supply, fill #1
  Filled 2023-05-08 (×2): qty 45, 45d supply, fill #1
  Filled 2023-06-20 – 2023-10-07 (×2): qty 90, 90d supply, fill #2

## 2023-02-13 ENCOUNTER — Other Ambulatory Visit (HOSPITAL_BASED_OUTPATIENT_CLINIC_OR_DEPARTMENT_OTHER): Payer: Self-pay

## 2023-02-13 MED ORDER — TRAZODONE HCL 150 MG PO TABS
150.0000 mg | ORAL_TABLET | Freq: Every evening | ORAL | 3 refills | Status: DC
  Filled 2023-02-13: qty 30, 30d supply, fill #0
  Filled 2023-03-12: qty 90, 90d supply, fill #1

## 2023-02-22 ENCOUNTER — Other Ambulatory Visit (HOSPITAL_BASED_OUTPATIENT_CLINIC_OR_DEPARTMENT_OTHER): Payer: Self-pay

## 2023-02-22 MED FILL — Alirocumab Subcutaneous Solution Auto-Injector 150 MG/ML: SUBCUTANEOUS | 28 days supply | Qty: 2 | Fill #1 | Status: CN

## 2023-02-26 ENCOUNTER — Other Ambulatory Visit (HOSPITAL_BASED_OUTPATIENT_CLINIC_OR_DEPARTMENT_OTHER): Payer: Self-pay

## 2023-02-26 MED FILL — Alirocumab Subcutaneous Solution Auto-Injector 150 MG/ML: SUBCUTANEOUS | 84 days supply | Qty: 6 | Fill #1 | Status: CN

## 2023-02-28 ENCOUNTER — Other Ambulatory Visit (HOSPITAL_BASED_OUTPATIENT_CLINIC_OR_DEPARTMENT_OTHER): Payer: Self-pay

## 2023-03-01 MED FILL — Alirocumab Subcutaneous Solution Auto-Injector 150 MG/ML: SUBCUTANEOUS | 28 days supply | Qty: 2 | Fill #1 | Status: AC

## 2023-03-03 ENCOUNTER — Other Ambulatory Visit: Payer: Self-pay

## 2023-03-04 ENCOUNTER — Other Ambulatory Visit (HOSPITAL_BASED_OUTPATIENT_CLINIC_OR_DEPARTMENT_OTHER): Payer: Self-pay

## 2023-03-05 ENCOUNTER — Other Ambulatory Visit (HOSPITAL_BASED_OUTPATIENT_CLINIC_OR_DEPARTMENT_OTHER): Payer: Self-pay

## 2023-03-13 ENCOUNTER — Other Ambulatory Visit (HOSPITAL_BASED_OUTPATIENT_CLINIC_OR_DEPARTMENT_OTHER): Payer: Self-pay

## 2023-03-17 ENCOUNTER — Other Ambulatory Visit (HOSPITAL_BASED_OUTPATIENT_CLINIC_OR_DEPARTMENT_OTHER): Payer: Self-pay

## 2023-03-17 ENCOUNTER — Encounter (HOSPITAL_BASED_OUTPATIENT_CLINIC_OR_DEPARTMENT_OTHER): Payer: Self-pay

## 2023-03-25 ENCOUNTER — Other Ambulatory Visit (HOSPITAL_BASED_OUTPATIENT_CLINIC_OR_DEPARTMENT_OTHER): Payer: Self-pay

## 2023-03-28 ENCOUNTER — Other Ambulatory Visit (HOSPITAL_BASED_OUTPATIENT_CLINIC_OR_DEPARTMENT_OTHER): Payer: Self-pay

## 2023-04-02 ENCOUNTER — Other Ambulatory Visit (HOSPITAL_BASED_OUTPATIENT_CLINIC_OR_DEPARTMENT_OTHER): Payer: Self-pay

## 2023-04-02 ENCOUNTER — Other Ambulatory Visit: Payer: Self-pay

## 2023-04-02 MED FILL — Alirocumab Subcutaneous Solution Auto-Injector 150 MG/ML: SUBCUTANEOUS | 28 days supply | Qty: 2 | Fill #2 | Status: AC

## 2023-04-02 NOTE — Telephone Encounter (Signed)
From pt

## 2023-04-10 ENCOUNTER — Other Ambulatory Visit (HOSPITAL_BASED_OUTPATIENT_CLINIC_OR_DEPARTMENT_OTHER): Payer: Self-pay

## 2023-04-10 ENCOUNTER — Other Ambulatory Visit: Payer: Self-pay

## 2023-04-10 MED FILL — Alirocumab Subcutaneous Solution Auto-Injector 150 MG/ML: SUBCUTANEOUS | 28 days supply | Qty: 2 | Fill #3 | Status: CN

## 2023-04-10 NOTE — Telephone Encounter (Signed)
From pt

## 2023-04-14 ENCOUNTER — Other Ambulatory Visit (HOSPITAL_BASED_OUTPATIENT_CLINIC_OR_DEPARTMENT_OTHER): Payer: Self-pay

## 2023-04-16 ENCOUNTER — Other Ambulatory Visit (HOSPITAL_BASED_OUTPATIENT_CLINIC_OR_DEPARTMENT_OTHER): Payer: Self-pay

## 2023-04-16 MED ORDER — PREDNISOLONE ACETATE 1 % OP SUSP
1.0000 [drp] | Freq: Three times a day (TID) | OPHTHALMIC | 0 refills | Status: DC
  Filled 2023-04-16: qty 5, 34d supply, fill #0

## 2023-05-05 ENCOUNTER — Other Ambulatory Visit (HOSPITAL_BASED_OUTPATIENT_CLINIC_OR_DEPARTMENT_OTHER): Payer: Self-pay

## 2023-05-05 MED FILL — Alirocumab Subcutaneous Solution Auto-Injector 150 MG/ML: SUBCUTANEOUS | 28 days supply | Qty: 2 | Fill #3 | Status: AC

## 2023-05-08 ENCOUNTER — Other Ambulatory Visit (HOSPITAL_BASED_OUTPATIENT_CLINIC_OR_DEPARTMENT_OTHER): Payer: Self-pay

## 2023-05-13 ENCOUNTER — Other Ambulatory Visit (HOSPITAL_BASED_OUTPATIENT_CLINIC_OR_DEPARTMENT_OTHER): Payer: Self-pay

## 2023-05-18 ENCOUNTER — Other Ambulatory Visit (HOSPITAL_BASED_OUTPATIENT_CLINIC_OR_DEPARTMENT_OTHER): Payer: Self-pay

## 2023-05-19 ENCOUNTER — Other Ambulatory Visit (HOSPITAL_BASED_OUTPATIENT_CLINIC_OR_DEPARTMENT_OTHER): Payer: Self-pay

## 2023-05-19 ENCOUNTER — Other Ambulatory Visit: Payer: Self-pay

## 2023-05-21 ENCOUNTER — Telehealth: Payer: Self-pay

## 2023-05-21 NOTE — Telephone Encounter (Signed)
Please see mychart message from today 05/21/2023

## 2023-05-22 ENCOUNTER — Ambulatory Visit: Payer: Medicare Other | Admitting: Family Medicine

## 2023-05-29 ENCOUNTER — Other Ambulatory Visit (HOSPITAL_BASED_OUTPATIENT_CLINIC_OR_DEPARTMENT_OTHER): Payer: Self-pay

## 2023-05-29 MED ORDER — PAROXETINE HCL 40 MG PO TABS
40.0000 mg | ORAL_TABLET | Freq: Every day | ORAL | 1 refills | Status: DC
  Filled 2023-05-29 – 2023-06-20 (×2): qty 90, 90d supply, fill #0

## 2023-05-29 MED ORDER — BUPROPION HCL ER (XL) 300 MG PO TB24
300.0000 mg | ORAL_TABLET | Freq: Every morning | ORAL | 2 refills | Status: DC
  Filled 2023-05-29: qty 30, 30d supply, fill #0

## 2023-05-29 MED ORDER — TRAZODONE HCL 150 MG PO TABS
150.0000 mg | ORAL_TABLET | Freq: Every day | ORAL | 1 refills | Status: DC
  Filled 2023-05-29: qty 90, 90d supply, fill #0
  Filled 2023-08-21: qty 90, 90d supply, fill #1

## 2023-05-31 ENCOUNTER — Other Ambulatory Visit (HOSPITAL_BASED_OUTPATIENT_CLINIC_OR_DEPARTMENT_OTHER): Payer: Self-pay

## 2023-05-31 MED ORDER — ALPRAZOLAM 0.25 MG PO TABS
0.2500 mg | ORAL_TABLET | Freq: Three times a day (TID) | ORAL | 5 refills | Status: DC
  Filled 2023-05-31: qty 90, 30d supply, fill #0

## 2023-06-05 ENCOUNTER — Other Ambulatory Visit (HOSPITAL_BASED_OUTPATIENT_CLINIC_OR_DEPARTMENT_OTHER): Payer: Self-pay

## 2023-06-05 MED ORDER — PREDNISONE 10 MG PO TABS
ORAL_TABLET | ORAL | 0 refills | Status: DC
  Filled 2023-06-05: qty 21, 6d supply, fill #0

## 2023-06-08 ENCOUNTER — Other Ambulatory Visit (HOSPITAL_BASED_OUTPATIENT_CLINIC_OR_DEPARTMENT_OTHER): Payer: Self-pay

## 2023-06-16 ENCOUNTER — Other Ambulatory Visit: Payer: Self-pay

## 2023-06-16 ENCOUNTER — Other Ambulatory Visit (HOSPITAL_BASED_OUTPATIENT_CLINIC_OR_DEPARTMENT_OTHER): Payer: Self-pay

## 2023-06-16 ENCOUNTER — Other Ambulatory Visit: Payer: Self-pay | Admitting: Cardiology

## 2023-06-16 DIAGNOSIS — Z789 Other specified health status: Secondary | ICD-10-CM

## 2023-06-16 DIAGNOSIS — E7800 Pure hypercholesterolemia, unspecified: Secondary | ICD-10-CM

## 2023-06-16 DIAGNOSIS — I2510 Atherosclerotic heart disease of native coronary artery without angina pectoris: Secondary | ICD-10-CM

## 2023-06-16 MED ORDER — PRALUENT 150 MG/ML ~~LOC~~ SOAJ
150.0000 mg | SUBCUTANEOUS | 3 refills | Status: DC
  Filled 2023-06-16: qty 6, 84d supply, fill #0
  Filled 2023-08-26: qty 6, 84d supply, fill #1
  Filled 2023-11-19: qty 6, 84d supply, fill #2

## 2023-06-17 ENCOUNTER — Other Ambulatory Visit (HOSPITAL_BASED_OUTPATIENT_CLINIC_OR_DEPARTMENT_OTHER): Payer: Self-pay

## 2023-06-17 MED ORDER — PRALUENT 150 MG/ML ~~LOC~~ SOAJ
150.0000 mg | SUBCUTANEOUS | 0 refills | Status: DC
  Filled 2023-06-17: qty 6, 84d supply, fill #0

## 2023-06-20 ENCOUNTER — Other Ambulatory Visit (HOSPITAL_BASED_OUTPATIENT_CLINIC_OR_DEPARTMENT_OTHER): Payer: Self-pay

## 2023-06-20 MED ORDER — BUPROPION HCL ER (XL) 150 MG PO TB24
150.0000 mg | ORAL_TABLET | Freq: Every morning | ORAL | 1 refills | Status: DC
  Filled 2023-06-20: qty 90, 90d supply, fill #0

## 2023-06-21 ENCOUNTER — Other Ambulatory Visit (HOSPITAL_BASED_OUTPATIENT_CLINIC_OR_DEPARTMENT_OTHER): Payer: Self-pay

## 2023-06-23 ENCOUNTER — Other Ambulatory Visit: Payer: Self-pay

## 2023-06-24 ENCOUNTER — Other Ambulatory Visit (HOSPITAL_BASED_OUTPATIENT_CLINIC_OR_DEPARTMENT_OTHER): Payer: Self-pay

## 2023-07-31 ENCOUNTER — Other Ambulatory Visit (HOSPITAL_BASED_OUTPATIENT_CLINIC_OR_DEPARTMENT_OTHER): Payer: Self-pay

## 2023-08-01 ENCOUNTER — Other Ambulatory Visit (HOSPITAL_COMMUNITY): Payer: Self-pay

## 2023-08-01 ENCOUNTER — Other Ambulatory Visit: Payer: Self-pay

## 2023-08-20 ENCOUNTER — Other Ambulatory Visit: Payer: Self-pay

## 2023-08-20 ENCOUNTER — Other Ambulatory Visit (HOSPITAL_BASED_OUTPATIENT_CLINIC_OR_DEPARTMENT_OTHER): Payer: Self-pay

## 2023-08-20 MED ORDER — ALPRAZOLAM 0.25 MG PO TABS
0.2500 mg | ORAL_TABLET | Freq: Three times a day (TID) | ORAL | 5 refills | Status: AC | PRN
  Filled 2023-08-20: qty 90, 30d supply, fill #0
  Filled 2023-12-23 (×3): qty 90, 30d supply, fill #1
  Filled 2024-02-13: qty 90, 30d supply, fill #2

## 2023-08-20 MED ORDER — TRAZODONE HCL 150 MG PO TABS
150.0000 mg | ORAL_TABLET | Freq: Every day | ORAL | 1 refills | Status: DC
  Filled 2023-08-20: qty 90, 90d supply, fill #0

## 2023-08-20 MED ORDER — PAROXETINE HCL 40 MG PO TABS
40.0000 mg | ORAL_TABLET | Freq: Every day | ORAL | 5 refills | Status: AC
  Filled 2023-08-20 – 2023-10-16 (×2): qty 30, 30d supply, fill #0
  Filled 2023-11-11 – 2024-02-13 (×2): qty 30, 30d supply, fill #1

## 2023-08-22 ENCOUNTER — Other Ambulatory Visit (HOSPITAL_BASED_OUTPATIENT_CLINIC_OR_DEPARTMENT_OTHER): Payer: Self-pay

## 2023-08-22 ENCOUNTER — Other Ambulatory Visit: Payer: Self-pay

## 2023-08-25 ENCOUNTER — Encounter: Payer: Self-pay | Admitting: Family Medicine

## 2023-08-25 ENCOUNTER — Telehealth: Payer: Self-pay

## 2023-08-25 ENCOUNTER — Telehealth (INDEPENDENT_AMBULATORY_CARE_PROVIDER_SITE_OTHER): Payer: Medicare Other | Admitting: Family Medicine

## 2023-08-25 DIAGNOSIS — G4733 Obstructive sleep apnea (adult) (pediatric): Secondary | ICD-10-CM | POA: Diagnosis not present

## 2023-08-25 NOTE — Patient Instructions (Addendum)
Please continue using your CPAP regularly. While your insurance requires that you use CPAP at least 4 hours each night on 70% of the nights, I recommend, that you not skip any nights and use it throughout the night if you can. Getting used to CPAP and staying with the treatment long term does take time and patience and discipline. Untreated obstructive sleep apnea when it is moderate to severe can have an adverse impact on cardiovascular health and raise her risk for heart disease, arrhythmias, hypertension, congestive heart failure, stroke and diabetes. Untreated obstructive sleep apnea causes sleep disruption, nonrestorative sleep, and sleep deprivation. This can have an impact on your day to day functioning and cause daytime sleepiness and impairment of cognitive function, memory loss, mood disturbance, and problems focussing. Using CPAP regularly can improve these symptoms.  We will update supply orders, today. Please contact DME regarding O2 needs while traveling. I would like to order a sleep study to assess need for CPAP and or O2 following weight loss. You should hear back from the sleep lab staff to schedule. We will decide how to proceed pending those results. Let me know if you have any questions or concerns!  Follow up pending sleep study results.

## 2023-08-25 NOTE — Telephone Encounter (Signed)
Please see Mychart message from today 08/25/2023

## 2023-08-25 NOTE — Progress Notes (Signed)
PATIENT: Shelley Spencer DOB: 03/10/1946  REASON FOR VISIT: follow up HISTORY FROM: patient  Virtual Visit via Telephone Note  I connected with Shelley Spencer on 08/25/23 at  9:45 AM EST by telephone and verified that I am speaking with the correct person using two identifiers.   I discussed the limitations, risks, security and privacy concerns of performing an evaluation and management service by telephone and the availability of in person appointments. I also discussed with the patient that there may be a patient responsible charge related to this service. The patient expressed understanding and agreed to proceed.   History of Present Illness:  08/25/23 ALL: Shelley Spencer is a 77 y.o. female here today for follow up for OSA on CPAP. She reports doing well. She is using CPAP every night for about . She denies concerns with machine or suplies. She continues 2L O2 at night through concentrator. She has lost about 50lbs.     05/20/22 ALL:  Shelley Spencer returns for follow up for OSA on CPAP with hypoxia treated with O2 2L/min. She is doing very well on therapy. She denies concerns with CPAP or supplies. She is tolerating pressure settings.     05/17/2021 ALL:  Shelley Spencer is a 77 y.o. female here today for follow up for OSA on CPAP.  PSG 10/2020 showed mild OSA with AHI 10.3/hr, exacerbated in REM to 30/hr and hypoxemia was noted. Titration study showed reponse at 8cmH20 and 2L/O2. AutoPAP was ordered at 5-13cmH20with small Simplus FFM. She has not been able to get oxygen covered by insurance but has ONO scheduled this month. She does feel much better rested. She is sleeping better. She had to go to a local [employer] to get new masks, had trouble with customer service line.   Compliance report dated 04/17/2021-05/16/2021 shows she used CPAP 30/30 nights and 27/30 nights for greater than 4 hours. Average usage was 7 hours. Residual AHI was 4.8 on 5-13cmH20. No leak.   HISTORY: (copied from Dr  Dohmeier's previous note)  Shelley Spencer is a 77 y.o. year old White or Caucasian female patient seen here as a referral on 10/18/2020 from Dr Rosemary Holms. Chief concern according to patient : see above :   Mrs. Shelley Spencer presents today upon referral by her cardiology practice, her primary care physician is Dr. Sherrye Payor but in June 2021 she had developed a gait disturbance involving her left lower extremity and was diagnosed with a right pontine stroke she recuperated well from this an MRI had showed moderate chronic small vessel disease.  She had to stop cholesterol medication due to muscle aches.  In November she presented to the [facility] emergency department for complaints of chest pain troponins were negative EKG without changes she was evaluated by Dr. Dr. Daryl Eastern at the time started on Imdur for what was considered chronic stable angina.  She had no recurrence of the chest pain.  But they have been reports that the patient had dropping oxygen levels while she was sleeping and observed.    Shelley Spencer is a right -handed Caucasian female with a possible sleep disorder. She  has a past medical history of Anemia, Anxiety, Arthritis, Asthma, Bicornuate uterus, CAD (coronary artery disease), Colon polyp, clinical Depression, Deviated nasal septum, DJD (degenerative joint disease), Fibroids, GERD (gastroesophageal reflux disease), Hematuria, Hiatal hernia, HNP (herniated nucleus pulposus), Hyperlipidemia, Hypertension, MVP (mitral valve prolapse), Pre-diabetes, Retinal tear (2005), and Shingles (1980's)..   Family medical Shelley Spencer history:no  other family member on CPAP with OSA, both parents had COPD.    Social history:  Patient is working as as a Runner, broadcasting/film/video, she married a much older man and he has chronic health condition, culminating in a heart transplant.   she lives in a household with spouse, her children are grown. A 70 year old son lives with them and a 73 year-old grandchild, The boy overdosed on  Fentanyl while in mother's hoe, now son has custody. Son is a recovering alcoholic.   The patient currently works full time in special ed. Pets are present. A dog and a cat.  Tobacco use see above .The patient has a remote history of smoking. Age 62-30, ETOH use - seldom,  Caffeine intake in form of Coffee( 1 cup a day) Soda( /) Tea (iced tea - 3 glasses a day. )  Regular exercise - not much.      Sleep habits are as follows: The patient's dinner time is between 6-7 PM. The patient goes to bed at 10-11 PM, takes trazodone. Sleeps in a cool, quiet and dark bedroom- husband is deaf.  She continues to sleep for intervals of 1-2 hours when not on Trazodone, wakes for 1 bathroom break.  The preferred sleep position is on her side, with the support of 1-2 pillows.  Dreams are reportedly infrequent but /vivid.  7  AM is the usual rise time.  The patient wakes up with an alarm. Likes to snooze the alarm.  She reports not feeling refreshed or restored in AM, with symptoms such as dry mouth , but no morning headaches, and residual fatigue. By lunchtime she gets sleepy again.  Naps are taken infrequently.   Observations/Objective:  Generalized: Well developed, in no acute distress  Mentation: Alert oriented to time, place, history taking. Follows all commands speech and language fluent   Assessment and Plan:  77 y.o. year old female  has a past medical history of Anemia, Anxiety, Arthritis, Asthma, Bicornuate uterus, CAD (coronary artery disease), Colon polyp, Depression, Deviated septum, DJD (degenerative joint disease), Fibroids, GERD (gastroesophageal reflux disease), Hematuria, History of hiatal hernia, HNP (herniated nucleus pulposus), Hyperlipidemia, Hypertension, MVP (mitral valve prolapse), Pre-diabetes, Retinal tear (2005), Shingles (1980's), Sleep apnea, and Stroke (cerebrum) (HCC) (02/2020). here with    ICD-10-CM   1. OSA on CPAP  G47.33 For home use only DME continuous positive airway  pressure (CPAP)    Home sleep test      Shelley Spencer reports doing well on therapy. Compliance report shows . She was encouraged to continue CPAP nightly for at least 4 hours. She will continue O2 at 2L nightly. We have discussed previous sleep study results and recent weight loss. I have recommended a repeat sleep study to evaluate need for CPAP and or O2. Will place orders, today. She will follow up pending sleep study results.    Orders Placed This Encounter  Procedures   For home use only DME continuous positive airway pressure (CPAP)    Supplies    Order Specific Question:   Length of Need    Answer:   Lifetime    Order Specific Question:   Patient has OSA or probable OSA    Answer:   Yes    Order Specific Question:   Is the patient currently using CPAP in the home    Answer:   Yes    Order Specific Question:   Settings    Answer:   Other see comments    Order Specific  Question:   CPAP supplies needed    Answer:   Mask, headgear, cushions, filters, heated tubing and water chamber   Home sleep test    Standing Status:   Future    Standing Expiration Date:   08/24/2024    Order Specific Question:   Where should this test be performed:    Answer:   [facility]    No orders of the defined types were placed in this encounter.    Follow Up Instructions:  I discussed the assessment and treatment plan with the patient. The patient was provided an opportunity to ask questions and all were answered. The patient agreed with the plan and demonstrated an understanding of the instructions.   The patient was advised to call back or seek an in-person evaluation if the symptoms worsen or if the condition fails to improve as anticipated.  I provided 20 minutes of non-face-to-face time during this encounter. Patient located at their place of residence during Mychart visit. Provider is in the office.    Shawnie Dapper, NP

## 2023-08-26 ENCOUNTER — Other Ambulatory Visit (HOSPITAL_BASED_OUTPATIENT_CLINIC_OR_DEPARTMENT_OTHER): Payer: Self-pay

## 2023-08-26 ENCOUNTER — Other Ambulatory Visit: Payer: Self-pay

## 2023-09-04 ENCOUNTER — Ambulatory Visit: Payer: Medicare Other | Admitting: Internal Medicine

## 2023-09-09 ENCOUNTER — Ambulatory Visit: Payer: Medicare Other | Admitting: Cardiology

## 2023-09-09 ENCOUNTER — Encounter: Payer: Self-pay | Admitting: Cardiology

## 2023-09-09 ENCOUNTER — Other Ambulatory Visit (HOSPITAL_BASED_OUTPATIENT_CLINIC_OR_DEPARTMENT_OTHER): Payer: Self-pay

## 2023-09-09 ENCOUNTER — Ambulatory Visit: Payer: Medicare Other | Attending: Internal Medicine | Admitting: Cardiology

## 2023-09-09 VITALS — BP 140/78 | HR 76 | Resp 16 | Ht 62.0 in | Wt 148.0 lb

## 2023-09-09 DIAGNOSIS — I700 Atherosclerosis of aorta: Secondary | ICD-10-CM

## 2023-09-09 DIAGNOSIS — I10 Essential (primary) hypertension: Secondary | ICD-10-CM | POA: Diagnosis not present

## 2023-09-09 DIAGNOSIS — I6350 Cerebral infarction due to unspecified occlusion or stenosis of unspecified cerebral artery: Secondary | ICD-10-CM

## 2023-09-09 DIAGNOSIS — I25118 Atherosclerotic heart disease of native coronary artery with other forms of angina pectoris: Secondary | ICD-10-CM

## 2023-09-09 DIAGNOSIS — I2510 Atherosclerotic heart disease of native coronary artery without angina pectoris: Secondary | ICD-10-CM

## 2023-09-09 DIAGNOSIS — E7800 Pure hypercholesterolemia, unspecified: Secondary | ICD-10-CM | POA: Diagnosis not present

## 2023-09-09 MED ORDER — PREDNISONE 5 MG (21) PO TBPK
ORAL_TABLET | ORAL | 0 refills | Status: DC
  Filled 2023-09-09: qty 21, 6d supply, fill #0

## 2023-09-09 MED ORDER — DOXYCYCLINE MONOHYDRATE 100 MG PO CAPS
100.0000 mg | ORAL_CAPSULE | Freq: Two times a day (BID) | ORAL | 0 refills | Status: DC
  Filled 2023-09-09: qty 14, 7d supply, fill #0

## 2023-09-09 NOTE — Progress Notes (Signed)
Cardiology Office Note:  .   Date:  09/09/2023  ID:  Shelley Spencer, DOB [birth year], MRN [id number] PCP:  [name], MD  Former Cardiology Providers: Dr. Clotilde Dieter  [facility] Providers Cardiologist:  [name], DO , [facility] (established care 09/09/23) Electrophysiologist:  None  Click to update primary MD,subspecialty MD or APP then REFRESH:1}    Chief Complaint  Patient presents with   Coronary artery disease of native artery of native heart wi   Follow-up    History of Present Illness: .   Shelley Spencer is a 77 y.o. Caucasian female whose past medical history and cardiovascular risk factors includes: Hypertension, hyperlipidemia, hyperglycemia, chronic kidney disease stage III, former smoker, moderate CAD, history of right pontine stroke, sleep apnea on device therapy, Aortic atherosclerosis.  Formally under the care of Dr. Rozell Searing Custovic who last saw Shelley Spencer back in December 2024. I am seeing her for the first time to re-establishing care.   Patient is being followed to the practice given her history of known CAC, CAD based on her last coronary CTA, aortic atherosclerosis, and other various cardiovascular risk factors.  Over the last 1 year she is doing well from a cardiovascular standpoint.  Denies anginal chest pain or heart failure symptoms.  Overall functional capacity remains stable and no significant change in endurance.  Review of Systems: .   Review of Systems  Cardiovascular:  Negative for chest pain, claudication, irregular heartbeat, leg swelling, near-syncope, orthopnea, palpitations, paroxysmal nocturnal dyspnea and syncope.  Respiratory:  Negative for shortness of breath.   Hematologic/Lymphatic: Negative for bleeding problem.    Studies Reviewed:   EKG: EKG Interpretation Date/Time:  Tuesday September 09 2023 11:12:21 EST Ventricular Rate:  76 PR Interval:  162 QRS Duration:  76 QT Interval:  388 QTC Calculation: 436 R  Axis:   11  Text Interpretation: Normal sinus rhythm Normal ECG When compared with ECG of 07-Nov-2022 16:47, PREVIOUS ECG IS PRESENT Confirmed by [name] [id number]) on 09/09/2023 11:14:01 AM [name] how are you tired tired working too hard can also use for the first time actually  Echocardiogram: January 2023: LVEF 58%. Grade 1 diastolic dysfunction. Mild aortic regurgitation. Mild MR. See report for additional details  Stress Testing: MPI 07/2020 Low risk study   Coronary CTA 03/30/2019:  1. Coronary calcium score of 33. This was 87 percentile for age and sex matched control. 2. Normal coronary origin with left dominance. 3. Moderate plaque with stenosis 50-69% in the mid LAD. CAD RADS 3. Additional analysis with CT FFR will be submitted.  RADIOLOGY: MR angio of the neck and head 03/15/2020 without contrast: 1. No intracranial large vessel occlusion or proximal high-grade arterial stenosis. 2. 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 3. Acute right paramedian pontine infarct.   CT angio chest, abdomen, pelvis 08/13/2020: 1. No acute aortic abnormality. 2. Mild to moderate aortic atherosclerosis. 3. 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 4. 2 cm slightly hyperdense lesion off the lower pole of the right kidney which could represent a hemorrhagic/proteinaceous cyst. If further evaluation is required would recommend dedicated renal ultrasound. 5.  Aortic Atherosclerosis (ICD10-I70.0).  Risk Assessment/Calculations:   N/A   Labs:       Latest Ref Rng & Units 07/30/2022    4:13 AM 07/19/2022   10:52 AM 03/22/2021   11:15 AM  CBC  WBC 4.0 - 10.5 K/uL 11.9  6.7  5.8   Hemoglobin 12.0 - 15.0 g/dL 16.1  09.6  04.5   Hematocrit 36.0 - 46.0 % 34.7  43.5  46.8   Platelets 150 - 400 K/uL 208  299  284        Latest Ref Rng & Units 07/30/2022    4:13 AM 07/19/2022   10:52 AM 03/22/2021   11:15 AM  BMP   Glucose 70 - 99 mg/dL 409  811  914   BUN 8 - 23 mg/dL 18  19  16    Creatinine 0.44 - 1.00 mg/dL 7.82  9.56  2.13   Sodium 135 - 145 mmol/L 142  142  143   Potassium 3.5 - 5.1 mmol/L 4.2  4.2  4.0   Chloride 98 - 111 mmol/L 113  106  105   CO2 22 - 32 mmol/L 25  29  29    Calcium 8.9 - 10.3 mg/dL 8.5  9.4  08.6       Latest Ref Rng & Units 07/30/2022    4:13 AM 07/19/2022   10:52 AM 03/22/2021   11:15 AM  CMP  Glucose 70 - 99 mg/dL 578  469  629   BUN 8 - 23 mg/dL 18  19  16    Creatinine 0.44 - 1.00 mg/dL 5.28  4.13  2.44   Sodium 135 - 145 mmol/L 142  142  143   Potassium 3.5 - 5.1 mmol/L 4.2  4.2  4.0   Chloride 98 - 111 mmol/L 113  106  105   CO2 22 - 32 mmol/L 25  29  29    Calcium 8.9 - 10.3 mg/dL 8.5  9.4  01.0   Total Protein 6.5 - 8.1 g/dL   7.2   Total Bilirubin 0.3 - 1.2 mg/dL   0.4   Alkaline Phos 38 - 126 U/L   103   AST 15 - 41 U/L   17   ALT 0 - 44 U/L   17     Lab Results  Component Value Date   CHOL 148 09/11/2020   HDL 66 09/11/2020   LDLCALC 63 09/11/2020   TRIG 103 09/11/2020   CHOLHDL 2.5 03/15/2020   No results for input(s): "LIPOA" in the last 8760 hours. No components found for: "NTPROBNP" No results for input(s): "PROBNP" in the last 8760 hours. No results for input(s): "TSH" in the last 8760 hours.  External Labs: Collected: August 15, 2023 available in Care Everywhere. Total cholesterol 139, triglycerides 90, HDL 53, LDL 68, non-HDL 86. Hemoglobin 12.8, hematocrit 39.3%. Sodium 144, potassium 4, chloride 108, bicarb 34. AST, ALT, alkaline phosphatase within normal limits.  Physical Exam:    Today's Vitals   09/09/23 1108  BP: (!) 140/78  Pulse: 76  Resp: 16  SpO2: 95%  Weight: 148 lb (67.1 kg)  Height: 5\' 2"  (1.575 m)   Body mass index is 27.07 kg/m. Wt Readings from Last 3 Encounters:  09/09/23 148 lb (67.1 kg)  11/07/22 152 lb (68.9 kg)  09/03/22 164 lb 3.2 oz (74.5 kg)    Physical Exam  Constitutional: No distress.   hemodynamically stable  Neck: No JVD present.  Cardiovascular: Normal rate, regular rhythm, S1 normal and S2 normal. Exam reveals no gallop, no S3 and no S4.  No murmur heard. Pulmonary/Chest: Effort normal and breath sounds normal. No stridor. She has no wheezes. She has no rales.  Abdominal: Soft. Bowel sounds are normal. She exhibits no distension. There is no abdominal tenderness.  Musculoskeletal:        General: No edema.     Cervical back: Neck supple.  Neurological: She is alert and oriented to person, place, and time. She has intact cranial nerves (2-12).  Skin: Skin is warm.     Impression & Recommendation(s):  Impression:   ICD-10-CM   1. Coronary artery disease involving native coronary artery of native heart without angina pectoris  I25.10 EKG 12-Lead    2. Atherosclerosis of aorta (HCC)  I70.0     3. Essential hypertension  I10     4. Hypercholesteremia  E78.00     5. Right pontine stroke (HCC)  I63.50        Recommendation(s):  Coronary artery disease involving native coronary artery of native heart without angina pectoris Atherosclerosis of aorta (HCC) Over the last 1 year she denies any anginal chest pain or heart failure symptoms. EKG today is nonischemic. Prior echocardiogram, coronary CTA results reviewed as part of today's office visit as I am seeing him for the first time. Reemphasized the importance of secondary prevention with focus on improving her modifiable cardiovascular risk factors such as glycemic control, lipid management, blood pressure control, weight loss. Medications briefly reviewed.  She will call us back with a more accurate list  Essential hypertension Office blood pressures are acceptable but not at goal. Patient states that home blood pressures are better controlled Reemphasized importance of low-salt diet. She will call us back for med reconciliation  Hypercholesteremia Currently on Praluent.   She denies myalgia or other side  effects. Most recent lipids dated November 2024, independently reviewed as noted above.  LDL 80 mg/dL Currently managed by primary care provider.  Right pontine stroke [facility]) Since her stroke has been transition from aspirin to Plavix. She has been intolerant of statin therapy in the past and therefore she is currently on PCSK9 inhibitors. Reemphasized importance of blood pressure management..  Orders Placed:  Orders Placed This Encounter  Procedures   EKG 12-Lead   As part of today's office visit reviewed/discussed management of at least 2 chronic comorbid conditions, reviewed the results of the prior echo, coronary CTA results, EKG, last office note from Dr. Rozell Searing Custovic dated 09/03/2022.  Final Medication List:   No orders of the defined types were placed in this encounter.   Current Outpatient Medications:    acetaminophen (TYLENOL) 650 MG CR tablet, Take 1,300 mg by mouth every 8 (eight) hours as needed for pain. , Disp: , Rfl:    albuterol (VENTOLIN HFA) 108 (90 Base) MCG/ACT inhaler, Inhale 1-2 puffs into the lungs every 6 (six) hours as needed for wheezing or shortness of breath. , Disp: , Rfl:    Alirocumab (PRALUENT) 150 MG/ML SOAJ, Inject 1 mL (150 mg total) into the skin every 14 (fourteen) days., Disp: 6 mL, Rfl: 0   ALPRAZolam (XANAX) 0.25 MG tablet, Take 1 tablet (0.25 mg total) by mouth 3 (three) times daily as needed for anxiety., Disp: 90 tablet, Rfl: 5   amoxicillin-clavulanate (AUGMENTIN) 875-125 MG tablet, Take 1 tablet by mouth every 12 (twelve) hours for 7 days., Disp: 14 tablet, Rfl: 0   budesonide-formoterol (SYMBICORT) 160-4.5 MCG/ACT inhaler, Inhale 2 puffs into the lungs daily as needed (shortness of breath)., Disp: , Rfl:    CALCIUM-VITAMIN D PO, Take 1 tablet by mouth daily., Disp: , Rfl:    cetirizine (ZYRTEC) 10 MG tablet, Take 10 mg by mouth daily., Disp: , Rfl:    clopidogrel (PLAVIX) 75 MG tablet, Take  1 tablet (75 mg total) by mouth daily., Disp: 90  tablet, Rfl: 3   furosemide (LASIX) 20 MG tablet, Take 1 tablet (20 mg total) by mouth daily., Disp: 90 tablet, Rfl: 3   gabapentin (NEURONTIN) 300 MG capsule, Take 300 mg by mouth at bedtime., Disp: , Rfl:    KLOR-CON M10 10 MEQ tablet, Take 10 mEq by mouth daily., Disp: , Rfl:    magnesium gluconate (MAGONATE) 500 MG tablet, Take 500 mg by mouth daily., Disp: , Rfl:    Multiple Vitamins-Minerals (MULTIVITAMIN PO), Take 1 tablet by mouth daily., Disp: , Rfl:    nitroGLYCERIN (NITROSTAT) 0.4 MG SL tablet, Place 1 tablet (0.4 mg total) under the tongue every 5 (five) minutes as needed for chest pain., Disp: 30 tablet, Rfl: 2   NON FORMULARY, CPAP, Disp: , Rfl:    olmesartan (BENICAR) 20 MG tablet, Take 1 tablet (20 mg total) by mouth every evening., Disp: 90 tablet, Rfl: 3   oxyCODONE (OXY IR/ROXICODONE) 5 MG immediate release tablet, Take 1-2 tablets (5-10 mg total) by mouth every 6 (six) hours as needed for moderate pain or severe pain., Disp: 42 tablet, Rfl: 0   OXYGEN, Inhale 3 L into the lungs at bedtime., Disp: , Rfl:    pantoprazole (PROTONIX) 40 MG tablet, Take 1 tablet (40 mg total) by mouth daily. (Patient taking differently: Take 40 mg by mouth 2 (two) times daily.), Disp: 90 tablet, Rfl: 3   PARoxetine (PAXIL) 40 MG tablet, Take 1 tablet (40 mg total) by mouth daily., Disp: 90 tablet, Rfl: 1   potassium chloride (KLOR-CON) 10 MEQ tablet, Take 1 tablet (10 mEq total) by mouth daily with food, Disp: 90 tablet, Rfl: 3   prednisoLONE acetate (PRED FORTE) 1 % ophthalmic suspension, Place 1 drop into both eyes 3 (three) times daily for 1 week, then 1 drop in both eyes twice daily the 2nd week, Disp: 5 mL, Rfl: 0   tirzepatide (MOUNJARO) 15 MG/0.5ML Pen, Inject 15mg  under the skin once weekly, Disp: 2 mL, Rfl: 11   traZODone (DESYREL) 150 MG tablet, Take 1 tablet (150 mg total) by mouth at bedtime., Disp: 90 tablet, Rfl: 1   vitamin B-12 (CYANOCOBALAMIN) 1000 MCG tablet, Take 1,000 mcg by mouth  daily., Disp: , Rfl:    Alirocumab (PRALUENT) 150 MG/ML SOAJ, Inject 1 mL (150 mg total) into the skin every 14 (fourteen) days., Disp: 6 mL, Rfl: 3   ALPRAZolam (XANAX) 0.25 MG tablet, Take 1 tablet (0.25 mg total) by mouth 3 (three) times daily as needed for anxiety., Disp: 90 tablet, Rfl: 5   isosorbide mononitrate (IMDUR) 60 MG 24 hr tablet, Take 1 tablet (60 mg total) by mouth daily., Disp: 90 tablet, Rfl: 3   metoprolol succinate (TOPROL-XL) 25 MG 24 hr tablet, Take 1 tablet (25 mg total) by mouth daily. Take with or immediately following a meal., Disp: 30 tablet, Rfl: 3   PARoxetine (PAXIL) 40 MG tablet, Take 1 tablet (40 mg total) by mouth daily., Disp: 30 tablet, Rfl: 5   traZODone (DESYREL) 150 MG tablet, Take 1 tablet (150 mg total) by mouth at bedtime., Disp: 90 tablet, Rfl: 1  Consent:   N/A  Disposition:   1 year follow-up Patient may be asked to follow-up sooner based on the results of the above-mentioned testing.  Her questions and concerns were addressed to her satisfaction. She voices understanding of the recommendations provided during this encounter.    Signed, [name], DO, [facility]  [facility]  [address] [place], [place] [address] 09/09/2023 12:29 PM

## 2023-09-09 NOTE — Telephone Encounter (Signed)
Please reconcile the entire list.... There were question marks next to olmesartan, imdur, toprol xl, etc  Yamaira Spinner [place], DO, [facility]

## 2023-09-09 NOTE — Patient Instructions (Addendum)
Medication Instructions:   Your physician recommends that you continue on your current medications as directed. Please refer to the Current Medication list given to you today.  *If you need a refill on your cardiac medications before your next appointment, please call your pharmacy*    Follow-Up: At [facility], you and your health needs are our priority.  As part of our continuing mission to provide you with exceptional heart care, we have created designated Provider Care Teams.  These Care Teams include your primary Cardiologist (physician) and Advanced Practice Providers (APPs -  Physician Assistants and Nurse Practitioners) who all work together to provide you with the care you need, when you need it.  We recommend signing up for the patient portal called "MyChart".  Sign up information is provided on this After Visit Summary.  MyChart is used to connect with patients for Virtual Visits (Telemedicine).  Patients are able to view lab/test results, encounter notes, upcoming appointments, etc.  Non-urgent messages can be sent to your provider as well.   To learn more about what you can do with MyChart, go to [email or web address].    Your next appointment:   1 year(s)  Provider:   [name], DO

## 2023-09-09 NOTE — Telephone Encounter (Signed)
Called and spoke with patient. Verified entire MAR. Removed several duplicated items. Routing back to MD for review.

## 2023-09-11 ENCOUNTER — Ambulatory Visit: Payer: Medicare Other | Admitting: Cardiology

## 2023-10-08 ENCOUNTER — Other Ambulatory Visit (HOSPITAL_BASED_OUTPATIENT_CLINIC_OR_DEPARTMENT_OTHER): Payer: Self-pay

## 2023-10-09 ENCOUNTER — Other Ambulatory Visit (HOSPITAL_BASED_OUTPATIENT_CLINIC_OR_DEPARTMENT_OTHER): Payer: Self-pay

## 2023-10-09 MED ORDER — FUROSEMIDE 20 MG PO TABS
10.0000 mg | ORAL_TABLET | Freq: Every day | ORAL | 1 refills | Status: DC
  Filled 2023-10-09: qty 45, 90d supply, fill #0
  Filled 2024-07-05: qty 45, 90d supply, fill #1

## 2023-10-14 ENCOUNTER — Ambulatory Visit (INDEPENDENT_AMBULATORY_CARE_PROVIDER_SITE_OTHER): Payer: Medicare Other | Admitting: Neurology

## 2023-10-14 DIAGNOSIS — G4733 Obstructive sleep apnea (adult) (pediatric): Secondary | ICD-10-CM | POA: Diagnosis not present

## 2023-10-15 ENCOUNTER — Other Ambulatory Visit (HOSPITAL_BASED_OUTPATIENT_CLINIC_OR_DEPARTMENT_OTHER): Payer: Self-pay

## 2023-10-15 NOTE — Progress Notes (Signed)
[facility]   Shelley Spencer 78 year old female 14-Oct-1945   HOME SLEEP TEST REPORT ( by Watch PAT)   STUDY DATA available on 10-14-2022: :     ORDERING CLINICIAN: Amy Lomax, NP/ Melvyn Novas, MD  REFERRING CLINICIAN: [name], MD    CC Dr Coral Else / Odis Hollingshead Sunit, DO. Cardiology for CAD.    CLINICAL INFORMATION/HISTORY:  video visit with NP, 08-25-2023; 08/25/23 ALL: [name] is a 78 y.o. female here today for follow up for OSA on CPAP. She reports doing well. She is using CPAP every night for about . She denies concerns with machine or suplies.  She continues 2L O2 at night through concentrator.  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  AHI on CPAP 2.2/h.    We will update supply orders, today. Please contact DME regarding O2 needs while traveling. I would like to order a sleep study to assess need for CPAP and or O2 following weight loss.    [name] is a right -handed Caucasian female with a possible sleep disorder. She  has a past medical history of Anemia, Anxiety, Arthritis, Asthma, Bicornuate uterus, CAD (coronary artery disease), Colon polyp, clinical Depression, Deviated nasal septum, DJD (degenerative joint disease), Fibroids, GERD (gastroesophageal reflux disease), Hematuria, Hiatal hernia, HNP (herniated nucleus pulposus), Hyperlipidemia, Hypertension, MVP (mitral valve prolapse), Pre-diabetes, Retinal tear (2005), and Shingles (1980's)..         Epworth sleepiness score: X /24.   BMI: kg/m  148 lbs.    Neck Circumference: X   FINDINGS:   Sleep Summary:   Total Recording Time (hours, min):   8 hours 3 minutes  Total Sleep Time (hours, min):    6 hours 23 minutes             Percent REM (%):     20%                                   Respiratory Indices by AASM criteria:   Calculated pAHI (per hour):      23/h, following CMS criteria the AHI is 8.3/h.                        REM pAHI: 30.5/h  (and following CMS criteria 7.6/h  )                                             NREM pAHI:   21.3/h  (and following CMS criteria the AHI is 8.4/h   )                      Positional AHI:   The patient slept mostly in supine 358 minutes with an AHI of 22.3/h or by CMS criteria an AHI of 8.2/h.  There were only 26 minutes of right lateral sleep and the AHI was actually higher 29.7/h or 9.1/h when following CMS.   Snoring criteria the patient reached a mean volume of snoring at 45 dB which is louder than average , and snoring was present for two thirds of the night.  Oxygen Saturation Statistics:   Oxygen Saturation (%) Mean:     91%          O2 Saturation Range (%):    Between the nadir at 70% and a maximum of 98%.  Please note that this patient used to be on oxygen.                                   O2 Saturation (minutes) <89%: 10.6 minutes.          Pulse Rate Statistics:   Pulse Mean (bpm):   81 bpm              Pulse Range:    69- 93 bpm             IMPRESSION:  In spite of weight loss, this HST confirms the presence of mild OSA by CMS criteria and moderate OSA by AASM criteria, without any central events being identified.    RECOMMENDATION: Continuation of PAP therapy would be my recommendation.  CPAP use is considered optional at AHI below 10/h.      INTERPRETING PHYSICIAN:    Melvyn Novas, MD

## 2023-10-16 ENCOUNTER — Other Ambulatory Visit (HOSPITAL_BASED_OUTPATIENT_CLINIC_OR_DEPARTMENT_OTHER): Payer: Self-pay

## 2023-10-21 NOTE — Procedures (Signed)
[facility]   Shelley Spencer 78 year old female 14-Oct-1945   HOME SLEEP TEST REPORT ( by Watch PAT)   STUDY DATA available on 10-14-2022: :     ORDERING CLINICIAN: Amy Lomax, NP/ Melvyn Novas, MD  REFERRING CLINICIAN: [name], MD    CC Dr Coral Else / Odis Hollingshead Sunit, DO. Cardiology for CAD.    CLINICAL INFORMATION/HISTORY:  video visit with NP, 08-25-2023; 08/25/23 ALL: Shelley Spencer is a 78 y.o. female here today for follow up for OSA on CPAP. She reports doing well. She is using CPAP every night for about . She denies concerns with machine or suplies.  She continues 2L O2 at night through concentrator.  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  AHI on CPAP 2.2/h.    We will update supply orders, today. Please contact DME regarding O2 needs while traveling. I would like to order a sleep study to assess need for CPAP and or O2 following weight loss.    Shelley Spencer is a right -handed Caucasian female with a possible sleep disorder. She  has a past medical history of Anemia, Anxiety, Arthritis, Asthma, Bicornuate uterus, CAD (coronary artery disease), Colon polyp, clinical Depression, Deviated nasal septum, DJD (degenerative joint disease), Fibroids, GERD (gastroesophageal reflux disease), Hematuria, Hiatal hernia, HNP (herniated nucleus pulposus), Hyperlipidemia, Hypertension, MVP (mitral valve prolapse), Pre-diabetes, Retinal tear (2005), and Shingles (1980's)..         Epworth sleepiness score: X /24.   BMI: kg/m  148 lbs.    Neck Circumference: X   FINDINGS:   Sleep Summary:   Total Recording Time (hours, min):   8 hours 3 minutes  Total Sleep Time (hours, min):    6 hours 23 minutes             Percent REM (%):     20%                                   Respiratory Indices by AASM criteria:   Calculated pAHI (per hour):      23/h, following CMS criteria the AHI is 8.3/h.                        REM pAHI: 30.5/h  (and following CMS criteria 7.6/h  )                                             NREM pAHI:   21.3/h  (and following CMS criteria the AHI is 8.4/h   )                      Positional AHI:   The patient slept mostly in supine 358 minutes with an AHI of 22.3/h or by CMS criteria an AHI of 8.2/h.  There were only 26 minutes of right lateral sleep and the AHI was actually higher 29.7/h or 9.1/h when following CMS.   Snoring criteria the patient reached a mean volume of snoring at 45 dB which is louder than average , and snoring was present for two thirds of the night.  Oxygen Saturation Statistics:   Oxygen Saturation (%) Mean:     91%          O2 Saturation Range (%):    Between the nadir at 70% and a maximum of 98%.  Please note that this patient used to be on oxygen.                                   O2 Saturation (minutes) <89%: 10.6 minutes.          Pulse Rate Statistics:   Pulse Mean (bpm):   81 bpm              Pulse Range:    69- 93 bpm             IMPRESSION:  In spite of weight loss, this HST confirms the presence of mild OSA by CMS criteria and moderate OSA by AASM criteria, without any central events being identified.    RECOMMENDATION: Continuation of PAP therapy would be my recommendation.  CPAP use is considered optional at AHI below 10/h.      INTERPRETING PHYSICIAN:    Melvyn Novas, MD

## 2023-10-22 ENCOUNTER — Encounter: Payer: Self-pay | Admitting: Family Medicine

## 2023-10-30 ENCOUNTER — Other Ambulatory Visit (HOSPITAL_BASED_OUTPATIENT_CLINIC_OR_DEPARTMENT_OTHER): Payer: Self-pay

## 2023-10-30 MED ORDER — GABAPENTIN 300 MG PO CAPS
300.0000 mg | ORAL_CAPSULE | Freq: Every day | ORAL | 3 refills | Status: AC
  Filled 2024-01-17: qty 90, 90d supply, fill #1
  Filled 2024-02-13: qty 90, 90d supply, fill #2
  Filled 2024-07-28: qty 90, 90d supply, fill #3

## 2023-11-03 ENCOUNTER — Other Ambulatory Visit (HOSPITAL_BASED_OUTPATIENT_CLINIC_OR_DEPARTMENT_OTHER): Payer: Self-pay

## 2023-11-05 ENCOUNTER — Other Ambulatory Visit (HOSPITAL_BASED_OUTPATIENT_CLINIC_OR_DEPARTMENT_OTHER): Payer: Self-pay

## 2023-11-05 MED ORDER — HYOSCYAMINE SULFATE 0.125 MG PO TBDP
0.1250 mg | ORAL_TABLET | Freq: Three times a day (TID) | ORAL | 3 refills | Status: AC
  Filled 2023-11-05: qty 45, 15d supply, fill #0

## 2023-11-05 MED ORDER — DEXLANSOPRAZOLE 60 MG PO CPDR
60.0000 mg | DELAYED_RELEASE_CAPSULE | Freq: Every morning | ORAL | 2 refills | Status: DC
  Filled 2023-11-05 – 2024-01-15 (×3): qty 30, 30d supply, fill #0

## 2023-11-06 ENCOUNTER — Other Ambulatory Visit (HOSPITAL_BASED_OUTPATIENT_CLINIC_OR_DEPARTMENT_OTHER): Payer: Self-pay

## 2023-11-11 ENCOUNTER — Other Ambulatory Visit (HOSPITAL_BASED_OUTPATIENT_CLINIC_OR_DEPARTMENT_OTHER): Payer: Self-pay

## 2023-11-11 ENCOUNTER — Other Ambulatory Visit: Payer: Self-pay

## 2023-11-11 ENCOUNTER — Other Ambulatory Visit: Payer: Self-pay | Admitting: Internal Medicine

## 2023-11-11 MED ORDER — PAROXETINE HCL 40 MG PO TABS
40.0000 mg | ORAL_TABLET | Freq: Every day | ORAL | 0 refills | Status: DC
  Filled 2023-11-11: qty 90, 90d supply, fill #0

## 2023-11-11 MED ORDER — TRAZODONE HCL 150 MG PO TABS
150.0000 mg | ORAL_TABLET | Freq: Every day | ORAL | 0 refills | Status: DC
  Filled 2023-11-11: qty 90, 90d supply, fill #0

## 2023-11-12 ENCOUNTER — Other Ambulatory Visit (HOSPITAL_BASED_OUTPATIENT_CLINIC_OR_DEPARTMENT_OTHER): Payer: Self-pay

## 2023-11-14 ENCOUNTER — Other Ambulatory Visit (HOSPITAL_BASED_OUTPATIENT_CLINIC_OR_DEPARTMENT_OTHER): Payer: Self-pay

## 2023-11-14 MED ORDER — DEXLANSOPRAZOLE 60 MG PO CPDR
60.0000 mg | DELAYED_RELEASE_CAPSULE | Freq: Every morning | ORAL | 11 refills | Status: AC
  Filled 2023-11-14: qty 30, 30d supply, fill #0
  Filled 2023-12-14: qty 30, 30d supply, fill #1
  Filled 2024-02-13: qty 30, 30d supply, fill #2
  Filled 2024-04-24 – 2024-06-16 (×2): qty 30, 30d supply, fill #3

## 2023-11-15 ENCOUNTER — Other Ambulatory Visit (HOSPITAL_BASED_OUTPATIENT_CLINIC_OR_DEPARTMENT_OTHER): Payer: Self-pay

## 2023-11-18 ENCOUNTER — Telehealth: Payer: Self-pay | Admitting: Cardiology

## 2023-11-18 ENCOUNTER — Other Ambulatory Visit (HOSPITAL_BASED_OUTPATIENT_CLINIC_OR_DEPARTMENT_OTHER): Payer: Self-pay

## 2023-11-18 ENCOUNTER — Other Ambulatory Visit: Payer: Self-pay | Admitting: Internal Medicine

## 2023-11-18 MED ORDER — CLOPIDOGREL BISULFATE 75 MG PO TABS
75.0000 mg | ORAL_TABLET | Freq: Every day | ORAL | 3 refills | Status: AC
  Filled 2023-11-18: qty 90, 90d supply, fill #0
  Filled 2024-02-13: qty 90, 90d supply, fill #1
  Filled 2024-06-01 (×2): qty 90, 90d supply, fill #2
  Filled 2024-08-26: qty 90, 90d supply, fill #3

## 2023-11-18 NOTE — Telephone Encounter (Signed)
 Pt's medication was sent to pt's pharmacy as requested. Confirmation received.

## 2023-11-18 NOTE — Telephone Encounter (Signed)
*  STAT* If patient is at the pharmacy, call can be transferred to refill team.   1. Which medications need to be refilled? (please list name of each medication and dose if known)   clopidogrel (PLAVIX) 75 MG tablet   2. Which pharmacy/location (including street and city if local pharmacy) is medication to be sent to? [facility] Phone: [phone]  Fax: [phone]      3. Do they need a 30 day or 90 day supply? 90

## 2023-11-19 ENCOUNTER — Other Ambulatory Visit (HOSPITAL_BASED_OUTPATIENT_CLINIC_OR_DEPARTMENT_OTHER): Payer: Self-pay

## 2023-11-20 ENCOUNTER — Other Ambulatory Visit (HOSPITAL_BASED_OUTPATIENT_CLINIC_OR_DEPARTMENT_OTHER): Payer: Self-pay

## 2023-11-20 MED ORDER — FUROSEMIDE 20 MG PO TABS
20.0000 mg | ORAL_TABLET | Freq: Every day | ORAL | 3 refills | Status: AC
  Filled 2023-11-20: qty 90, 90d supply, fill #0
  Filled 2024-04-24: qty 90, 90d supply, fill #1
  Filled 2024-07-05: qty 90, 90d supply, fill #2

## 2023-11-21 ENCOUNTER — Other Ambulatory Visit (HOSPITAL_BASED_OUTPATIENT_CLINIC_OR_DEPARTMENT_OTHER): Payer: Self-pay

## 2023-11-21 MED ORDER — POTASSIUM CHLORIDE CRYS ER 10 MEQ PO TBCR
10.0000 meq | EXTENDED_RELEASE_TABLET | Freq: Every day | ORAL | 3 refills | Status: AC
  Filled 2023-11-21: qty 90, 90d supply, fill #0
  Filled 2024-04-24: qty 90, 90d supply, fill #1
  Filled 2024-07-05: qty 90, 90d supply, fill #2

## 2023-11-24 ENCOUNTER — Other Ambulatory Visit (HOSPITAL_BASED_OUTPATIENT_CLINIC_OR_DEPARTMENT_OTHER): Payer: Self-pay

## 2023-11-27 ENCOUNTER — Other Ambulatory Visit: Payer: Self-pay

## 2023-11-27 NOTE — Progress Notes (Signed)
 12/01/2023 Shelley Spencer [id number] 04/29/1946  Referring provider: [name], MD Primary GI doctor: 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 All questions were answered and the patient acknowledges these risk and wishes to proceed.  GERD with history of grade a esophagitis, dysphagia intermittent EGD 08/2016  04/26/2021 barium swallow showed moderate to extensive reflux a small hiatal hernia negative for stricture Worsening reflux, switched pantoprazole 40 mg BID to dexilant 60 mg 3-4 weeks ago with improvement of her symptoms Due to worsening symptoms, will schedule EGD to evaluate for esophagitis, gastritis, barret's I discussed risks of EGD with patient today, including risk of sedation, bleeding or perforation.  Patient provides understanding and gave verbal consent to proceed.  CVA history/CAD On plavix  (2023 echo with LVEF 58%%, grade 1 diastolic, mild MR, no AS) No chest pain, no SOB Hold Plavix for 5 days before procedure will instruct when and how to resume after procedure.  Patient understands that there is a low but real risk of cardiovascular event such as heart attack, stroke, or embolism /  thrombosis, or ischemia while off Plavix.  The patient consents to proceed.  Will communicate by phone or EMR with patient's prescribing provider to confirm that holding Plavix is reasonable in this case.   obesity history of 50 lbs weight loss with mounjaro, has been on for 1-2 years Body mass index is 26.89 kg/m.  Discussed GLP1 with the patient, mechanism of action. Gastroparesis diet given to the patient.  Patient should be instructed to hold this medications if dose falls within 7 days of endoscopic procedure, due to  increased risk of retained gastric contents.    Patient Care Team: [name], MD as PCP - General (Internal Medicine) [name], DO as PCP - Cardiology (Cardiology)  HISTORY OF PRESENT ILLNESS: 78 y.o. female with a past medical history of CAD on Plavix (03/15/2020 echo with LVEF 65-70%), stroke in June 2021, GERD  and others listed below presents for evaluation of colonoscopy well on Plavix for history of CVA in 2021.   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    10/16/2016 office visit with [name] for follow-up of reflux.  At that time was discussed she could taper down to Protonix once a day and use Zantac 150 mg at bedtime as needed.    04/30/2017 colonoscopy with 1 5 mm polyp in the transverse colon, diverticulosis and nonbleeding internal hemorrhoids.  Pathology showed tubular adenoma.  Repeat was recommended in 5 years. 04/26/2021 barium swallow showed moderate to extensive reflux a small hiatal hernia negative for stricture  Discussed the use of AI scribe software for clinical note transcription with the patient, who gave verbal consent to proceed.  History of Present Illness   The patient, with a history of hiatal hernia, presents with gastrointestinal issues, including reflux and swallowing difficulties.  She experiences significant reflux, particularly when lying down, and has trouble swallowing certain foods intermittently. She was previously on Protonix, which was ineffective, and switched to Dexilant two weeks ago, which has improved her symptoms. Reflux occurs as soon as she lies down.  She has a history of a hiatal hernia and is concerned about its potential worsening due to her symptoms. A  barium swallow in 2022 showed extensive reflux but was negative for stricture. Her last endoscopy was in 2017.  She is overdue for a colonoscopy, with the last one performed in 2018 showing a large polyp. She is concerned  about the preparation for the procedure potentially worsening her reflux.  She is on Mounjaro for weight loss, having lost 50 pounds over a couple of years. She experiences nausea when injecting Mounjaro in her stomach, so she administers it in her legs instead. She reports some constipation, which she manages with Miralax and increased fluid intake.  She is on Plavix following a stroke in 2021, which fortunately did not leave any lasting issues.  No shortness of breath, chest discomfort, or significant abdominal pain. Occasional constipation and some abdominal discomfort, but no dark or black stools or blood in the stool.      She  reports that she quit smoking about 44 years ago. Her smoking use included cigarettes. She started smoking about 64 years ago. She has a 20 pack-year smoking history. She has never used smokeless tobacco. She reports that she does not currently use alcohol after a past usage of about 1.0 standard drink of alcohol per week. She reports that she does not use drugs.  RELEVANT GI HISTORY, IMAGING AND LABS: Results   RADIOLOGY Barium swallow: Extensive reflux, no stricture (2022)  DIAGNOSTIC Echocardiogram: Good ejection fraction, Grade 1 diastolic dysfunction, normal valves (2021) Colonoscopy: Large polyp (2018) Endoscopy: Extensive reflux, no stricture (2017)      CBC    Component Value Date/Time   WBC 11.9 (H) 07/30/2022 0413   RBC 4.09 07/30/2022 0413   HGB 11.0 (L) 07/30/2022 0413   HCT 34.7 (L) 07/30/2022 0413   PLT 208 07/30/2022 0413   MCV 84.8 07/30/2022 0413   MCH 26.9 07/30/2022 0413   MCHC 31.7 07/30/2022 0413   RDW 14.2 07/30/2022 0413   LYMPHSABS 1.7 03/22/2021 1115   MONOABS 0.4 03/22/2021 1115   EOSABS 0.2 03/22/2021 1115   BASOSABS 0.1 03/22/2021 1115   No results for input(s): "HGB" in the last 8760 hours.  CMP     Component Value Date/Time   NA 142 07/30/2022 0413   NA 145 (H) 03/19/2019 1159   K 4.2 07/30/2022 0413   CL 113  (H) 07/30/2022 0413   CO2 25 07/30/2022 0413   GLUCOSE 159 (H) 07/30/2022 0413   BUN 18 07/30/2022 0413   BUN 20 03/19/2019 1159   CREATININE 1.26 (H) 07/30/2022 0413   CALCIUM 8.5 (L) 07/30/2022 0413   PROT 7.2 03/22/2021 1115   ALBUMIN 4.2 03/22/2021 1115   AST 17 03/22/2021 1115   ALT 17 03/22/2021 1115   ALKPHOS 103 03/22/2021 1115   BILITOT 0.4 03/22/2021 1115   GFRNONAA 45 (L) 07/30/2022 0413   GFRAA 40 (L) 03/14/2020 1413      Latest Ref Rng & Units 03/22/2021   11:15 AM 03/15/2020    1:46 AM 04/07/2009    2:20 PM  Hepatic Function  Total Protein 6.5 - 8.1 g/dL 7.2  6.6  6.9   Albumin 3.5 - 5.0 g/dL 4.2  3.9  3.3   AST 15 - 41 U/L 17  31  17    ALT 0 - 44 U/L 17  18  22    Alk Phosphatase 38 - 126 U/L 103  90  123   Total Bilirubin 0.3 - 1.2 mg/dL 0.4  0.6  0.5   Bilirubin, Direct 0.0 - 0.2 mg/dL  0.3  Current Medications:   Current Outpatient Medications (Endocrine & Metabolic):    tirzepatide (MOUNJARO) 15 MG/0.5ML Pen, Inject 15mg  under the skin once weekly   predniSONE (STERAPRED UNI-PAK 21 TAB) 5 MG (21) TBPK tablet, Take as directed on packaging. (Patient not taking: Reported on 12/01/2023)  Current Outpatient Medications (Cardiovascular):    Alirocumab (PRALUENT) 150 MG/ML SOAJ, Inject 1 mL (150 mg total) into the skin every 14 (fourteen) days.   furosemide (LASIX) 20 MG tablet, Take ONE-HALF tablet (10 mg total) by mouth daily.   olmesartan (BENICAR) 20 MG tablet, Take 1 tablet (20 mg total) by mouth every evening.   furosemide (LASIX) 20 MG tablet, Take 1 tablet (20 mg total) by mouth daily. (Patient not taking: Reported on 12/01/2023)   nitroGLYCERIN (NITROSTAT) 0.4 MG SL tablet, Place 1 tablet (0.4 mg total) under the tongue every 5 (five) minutes as needed for chest pain. (Patient not taking: Reported on 12/01/2023)  Current Outpatient Medications (Respiratory):    albuterol (VENTOLIN HFA) 108 (90 Base) MCG/ACT inhaler, Inhale 1-2 puffs into the lungs  every 6 (six) hours as needed for wheezing or shortness of breath.    budesonide-formoterol (SYMBICORT) 160-4.5 MCG/ACT inhaler, Inhale 2 puffs into the lungs daily as needed (shortness of breath).   cetirizine (ZYRTEC) 10 MG tablet, Take 10 mg by mouth daily. (Patient not taking: Reported on 12/01/2023)  Current Outpatient Medications (Analgesics):    acetaminophen (TYLENOL) 650 MG CR tablet, Take 1,300 mg by mouth every 8 (eight) hours as needed for pain.   Current Outpatient Medications (Hematological):    clopidogrel (PLAVIX) 75 MG tablet, Take 1 tablet (75 mg total) by mouth daily.   vitamin B-12 (CYANOCOBALAMIN) 1000 MCG tablet, Take 1,000 mcg by mouth daily.  Current Outpatient Medications (Other):    ALPRAZolam (XANAX) 0.25 MG tablet, Take 1 tablet (0.25 mg total) by mouth 3 (three) times daily as needed for anxiety.   CALCIUM-VITAMIN D PO, Take 1 tablet by mouth daily.   dexlansoprazole (DEXILANT) 60 MG capsule, Take 1 capsule (60 mg total) by mouth every morning 1/2 to 1 hour before morning meal.   dexlansoprazole (DEXILANT) 60 MG capsule, Take 1 capsule (60 mg total) by mouth in the morning 1/2 to 1 hour before meal.   gabapentin (NEURONTIN) 300 MG capsule, Take 300 mg by mouth at bedtime.   gabapentin (NEURONTIN) 300 MG capsule, Take 1 capsule (300 mg total) by mouth daily.   hyoscyamine (ANASPAZ) 0.125 MG TBDP disintergrating tablet, Dissolve 1 tablet on the tongue and allow to dissolve as needed up to three times a day for abdominal pain or cramping   magnesium gluconate (MAGONATE) 500 MG tablet, Take 500 mg by mouth daily.   Multiple Vitamins-Minerals (MULTIVITAMIN PO), Take 1 tablet by mouth daily.   NON FORMULARY, CPAP   OXYGEN, Inhale 3 L into the lungs at bedtime.   PARoxetine (PAXIL) 40 MG tablet, Take 1 tablet (40 mg total) by mouth daily.   Sodium Sulfate-Mag Sulfate-KCl (SUTAB) [phone] MG TABS, Use as directed for colonoscopy. MANUFACTURER CODES!! BIN: [id number] PCN:  CN GROUP: [id number] MEMBER ID: [id number];V[employer] AS SECONDARY INSURANCE ;NO PRIOR AUTHORIZATION   traZODone (DESYREL) 150 MG tablet, Take 150 mg by mouth at bedtime.   doxycycline (MONODOX) 100 MG capsule, Take 1 capsule (100 mg total) by mouth 2 (two) times daily. (Patient not taking: Reported on 12/01/2023)   pantoprazole (PROTONIX) 40 MG tablet, Take 1 tablet (40 mg total) by mouth daily. (Patient not taking: Reported on 12/01/2023)  PARoxetine (PAXIL) 40 MG tablet, Take 1 tablet (40 mg total) by mouth daily. (Patient not taking: Reported on 12/01/2023)   potassium chloride (KLOR-CON M10) 10 MEQ tablet, Take 1 tablet (10 mEq total) by mouth daily with food (Patient not taking: Reported on 12/01/2023)   traZODone (DESYREL) 150 MG tablet, Take 1 tablet (150 mg total) by mouth at bedtime. (Patient not taking: Reported on 12/01/2023)  Medical History:  Past Medical History:  Diagnosis Date   Anemia    Anxiety    Arthritis    Asthma    Bicornuate uterus    double cervix   CAD (coronary artery disease)    Colon polyp    Depression    Deviated septum    DJD (degenerative joint disease)    L-Spine   Fibroids    GERD (gastroesophageal reflux disease)    Hematuria    History of hiatal hernia    HNP (herniated nucleus pulposus)    lower back   Hyperlipidemia    Hypertension    MVP (mitral valve prolapse)    pt denies, pt states she had a Echo ?2008 and it did not show MVP   Pre-diabetes    Retinal tear 2005   Small right retinal tear   Shingles 1980's   on her waist   Sleep apnea    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   Allergies:  Allergies  Allergen Reactions   Amlodipine Other (See Comments) and Swelling    Legs swell some and she doesn't like it.   Fenofibrate Other (See Comments)    Leg pain   Omeprazole Other (See Comments)    Unknown   Simvastatin Other (See Comments)    Myalgia, leg pains   Singulair [Montelukast Sodium] Other (See Comments)    Affected mood   Statins  Other (See Comments)    Myalgia, leg pains     Surgical History:  She  has a past surgical history that includes Lumbar fusion; Finger surgery (Left); Hysteroscopy; Laparoscopic hysterectomy (7/209/10); bone spur surgery (Bilateral); Tubal ligation; removal vaginal septum; Foot neuroma surgery (Bilateral); Cardiovascular stress test (02/08/2014); Knee arthroscopy with meniscal repair (Right, 2016); Colonoscopy; Anterior cervical decomp/discectomy fusion (N/A, 09/05/2017); Cyst excision (Left, 09/29/2018); Total knee arthroplasty (Right, 05/17/2019); Total knee arthroplasty (Left, 07/29/2022); and Cataract extraction (Bilateral). Family History:  Her family history includes Alcohol abuse in her father and mother; Bipolar disorder in her brother; Crohn's disease in her daughter; Diabetes in her mother; Emphysema in her father and mother; Heart failure in her father and mother; Thyroid disease in her daughter; Valvular heart disease in her brother.  REVIEW OF SYSTEMS  : All other systems reviewed and negative except where noted in the History of Present Illness.  PHYSICAL EXAM: BP 124/74   Pulse (!) 59   Ht 5\' 2"  (1.575 m)   Wt 147 lb (66.7 kg)   LMP  (LMP Unknown)   BMI 26.89 kg/m  Physical Exam   GENERAL APPEARANCE: Well nourished, in no apparent distress. HEENT: No cervical lymphadenopathy, unremarkable thyroid, sclerae anicteric, conjunctiva pink. RESPIRATORY: Respiratory effort normal, breath sounds equal bilaterally without rales, rhonchi, or wheezing. Lungs clear to auscultation bilaterally. CARDIO: Regular rate and rhythm with no murmurs, rubs, or gallops. Peripheral pulses intact. Heart sounds normal. ABDOMEN: Soft, non-distended, active bowel sounds in all four quadrants, mild discomfort, negative Murphy's sign, no tenderness to palpation, no rebound, no mass appreciated. RECTAL: Declines. MUSCULOSKELETAL: Full range of motion, normal gait, without edema. SKIN: Dry, intact without  rashes or lesions. No jaundice. NEURO: Alert, oriented, no focal deficits. PSYCH: Cooperative, normal mood and affect.      Doree Albee, PA-C 11:29 AM

## 2023-12-01 ENCOUNTER — Other Ambulatory Visit (HOSPITAL_BASED_OUTPATIENT_CLINIC_OR_DEPARTMENT_OTHER): Payer: Self-pay

## 2023-12-01 ENCOUNTER — Encounter: Payer: Self-pay | Admitting: Physician Assistant

## 2023-12-01 ENCOUNTER — Telehealth: Payer: Self-pay

## 2023-12-01 ENCOUNTER — Ambulatory Visit: Payer: Medicare Other | Admitting: Physician Assistant

## 2023-12-01 VITALS — BP 124/74 | HR 59 | Ht 62.0 in | Wt 147.0 lb

## 2023-12-01 DIAGNOSIS — Z860101 Personal history of adenomatous and serrated colon polyps: Secondary | ICD-10-CM

## 2023-12-01 DIAGNOSIS — Z1211 Encounter for screening for malignant neoplasm of colon: Secondary | ICD-10-CM

## 2023-12-01 DIAGNOSIS — K219 Gastro-esophageal reflux disease without esophagitis: Secondary | ICD-10-CM | POA: Diagnosis not present

## 2023-12-01 MED ORDER — SUTAB 1479-225-188 MG PO TABS: ORAL_TABLET | ORAL | 0 refills | Status: DC

## 2023-12-01 MED ORDER — SUFLAVE 178.7 G PO SOLR
1.0000 | Freq: Once | ORAL | 0 refills | Status: DC
  Filled 2023-12-01: qty 2, 1d supply, fill #0

## 2023-12-01 NOTE — Patient Instructions (Signed)
 Miralax is an osmotic laxative.  It only brings more water into the stool.  This is safe to take daily.  Can take up to 17 gram of miralax twice a day.  Mix with juice or coffee.  Start 1 capful at night for 3-4 days and reassess your response in 3-4 days.  You can increase and decrease the dose based on your response.  Remember, it can take up to 3-4 days to take effect OR for the effects to wear off.   I often pair this with benefiber in the morning to help assure the stool is not too loose.   Toileting tips to help with your constipation - Drink at least 64-80 ounces of water/liquid per day. - Establish a time to try to move your bowels every day.  For many people, this is after a cup of coffee or after a meal such as breakfast. - Sit all of the way back on the toilet keeping your back fairly straight and while sitting up, try to rest the tops of your forearms on your upper thighs.   - Raising your feet with a step stool/squatty potty can be helpful to improve the angle that allows your stool to pass through the rectum. - Relax the rectum feeling it bulge toward the toilet water.  If you feel your rectum raising toward your body, you are contracting rather than relaxing. - Breathe in and slowly exhale. "Belly breath" by expanding your belly towards your belly button. Keep belly expanded as you gently direct pressure down and back to the anus.  A low pitched GRRR sound can assist with increasing intra-abdominal pressure.  (Can also trying to blow on a pinwheel and make it move, this helps with the same belly breathing) - Repeat 3-4 times. If unsuccessful, contract the pelvic floor to restore normal tone and get off the toilet.  Avoid excessive straining. - To reduce excessive wiping by teaching your anus to normally contract, place hands on outer aspect of knees and resist knee movement outward.  Hold 5-10 second then place hands just inside of knees and resist inward movement of knees.  Hold 5  seconds.  Repeat a few times each way.  Go to the ER if unable to pass gas, severe AB pain, unable to hold down food, any shortness of breath of chest pain.  Continue dexilant, can add on pepcid at night as needed Please take this medication 30 minutes to 1 hour before meals- this makes it more effective.  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  Eat and drink liquids at separate times.  Avoid high fiber foods, cook your vegetables, avoid high fat food.  Suggest spreading protein throughout the day (greek yogurt, glucerna, soft meat, milk, eggs) Choose soft foods that you can mash with a fork When you are more symptomatic, change to pureed foods foods and liquids.  Consider reading "Living well with Gastroparesis" by Reuel Derby Gastroparesis is a condition in which food takes longer than normal to empty from the stomach. This condition is also known as delayed gastric  emptying. It is usually a long-term (chronic) condition. There is no cure, but there are treatments and things that you can do at home to help relieve symptoms. Treating the underlying condition that causes gastroparesis can also help relieve symptoms What are the causes? In many cases, the cause of this condition is not known. Possible causes include: A hormone (endocrine) disorder, such as hypothyroidism or diabetes. A nervous system disease, such as Parkinson's disease or multiple sclerosis. Cancer, infection, or surgery that affects the stomach or vagus nerve. The vagus nerve runs from your chest, through your  neck, and to the lower part of your brain. A connective tissue disorder, such as scleroderma. Certain medicines. What increases the risk? You are more likely to develop this condition if: You have certain disorders or diseases. These may include: An endocrine disorder. An eating disorder. Amyloidosis. Scleroderma. Parkinson's disease. Multiple sclerosis. Cancer or infection of the stomach or the vagus nerve. You have had surgery on your stomach or vagus nerve. You take certain medicines. You are female. What are the signs or symptoms? Symptoms of this condition include: Feeling full after eating very little or a loss of appetite. Nausea, vomiting, or heartburn. Bloating of your abdomen. Inconsistent blood sugar (glucose) levels on blood tests. Unexplained weight loss. Acid from the stomach coming up into the esophagus (gastroesophageal reflux). Sudden tightening (spasm) of the stomach, which can be painful. Symptoms may come and go. Some people may not notice any symptoms. How is this diagnosed? This condition is diagnosed with tests, such as: Tests that check how long it takes food to move through the stomach and intestines. These tests include: Upper gastrointestinal (GI) series. For this test, you drink a liquid that shows up well on X-rays, and then X-rays are taken of your intestines. Gastric emptying scintigraphy. For this test, you eat food that contains a small amount of radioactive material, and then scans are taken. Wireless capsule GI monitoring system. For this test, you swallow a pill (capsule) that records information about how foods and fluid move through your stomach. Gastric manometry. For this test, a tube is passed down your throat and into your stomach to measure electrical and muscular activity. Endoscopy. For this test, a long, thin tube with a camera and light on the end is passed down your throat and into your stomach to check for problems in your stomach  lining. Ultrasound. This test uses sound waves to create images of the inside of your body. This can help rule out gallbladder disease or pancreatitis as a cause of your symptoms. How is this treated? There is no cure for this condition, but treatment and home care may relieve symptoms. Treatment may include: Treating the underlying cause. Managing your symptoms by making changes to your diet and exercise habits. Taking medicines to control nausea and vomiting and to stimulate stomach muscles. Getting food through a feeding tube in the hospital. This may be done in severe cases. Having surgery to insert a device called a gastric electrical stimulator into your body. This device helps improve stomach emptying and control nausea and vomiting. Follow these instructions at home: Take over-the-counter and prescription medicines only as told by your health care provider. Follow instructions from your health care provider about eating or drinking restrictions. Your health care provider may recommend that you: Eat smaller meals more often. Eat low-fat foods. Eat low-fiber forms of high-fiber foods. For example, eat cooked vegetables instead of raw vegetables. Have only liquid foods instead of solid foods.  Liquid foods are easier to digest. Drink enough fluid to keep your urine pale yellow. Exercise as often as told by your health care provider. Keep all follow-up visits. This is important. Contact a health care provider if you: Notice that your symptoms do not improve with treatment. Have new symptoms. Get help right away if you: Have severe pain in your abdomen that does not improve with treatment. Have nausea that is severe or does not go away. Vomit every time you drink fluids. Summary Gastroparesis is a long-term (chronic) condition in which food takes longer than normal to empty from the stomach. Symptoms include nausea, vomiting, heartburn, bloating of your abdomen, and loss of  appetite. Eating smaller portions, low-fat foods, and low-fiber forms of high-fiber foods may help you manage your symptoms. Get help right away if you have severe pain in your abdomen. This information is not intended to replace advice given to you by your health care provider. Make sure you discuss any questions you have with your health care provider. Document Revised: 01/17/2020 Document Reviewed: 01/17/2020 Elsevier Patient Education  2021 [employer].

## 2023-12-01 NOTE — Telephone Encounter (Signed)
 [facility] Pre-operative Risk Assessment     Request for surgical clearance:     Endoscopy Procedure  What type of surgery is being performed?     Colonoscopy/ endo   When is this surgery scheduled?     01/02/2024  What type of clearance is required ?   Pharmacy  Are there any medications that need to be held prior to surgery and how long? Plavix 5 days   Practice name and name of physician performing surgery?      [facility]  What is your office phone and fax number?      Phone- [phone]  Fax- [phone]  Anesthesia type (None, local, MAC, general) ?       MAC   Please route your response to Shawon Denzer

## 2023-12-04 ENCOUNTER — Encounter: Payer: Self-pay | Admitting: Podiatry

## 2023-12-04 ENCOUNTER — Telehealth: Payer: Self-pay | Admitting: Podiatry

## 2023-12-04 ENCOUNTER — Ambulatory Visit: Admitting: Podiatry

## 2023-12-04 DIAGNOSIS — L600 Ingrowing nail: Secondary | ICD-10-CM | POA: Diagnosis not present

## 2023-12-04 NOTE — Telephone Encounter (Signed)
 Yes, patient can hold Plavix as requested by GI prior to her procedure.   Restart when she is cleared by GI procedure.  Deyona Soza [place], DO, [facility]

## 2023-12-04 NOTE — Telephone Encounter (Signed)
 Heather, please give this patient a call

## 2023-12-04 NOTE — Patient Instructions (Signed)

## 2023-12-04 NOTE — Progress Notes (Signed)
 Subjective:   Patient ID: Shelley Spencer, female   DOB: 78 y.o.   MRN: [id number]   HPI Patient presents stating she has had painful ingrown toenails on both big toes for a long time and has tried to trim them and has been to  pedicurist without relief.  States they get sore and they are increasingly hard for her to manage.  Patient does not smoke likes to be active   Review of Systems  All other systems reviewed and are negative.       Objective:  Physical Exam Vitals and nursing note reviewed.  Constitutional:      Appearance: She is well-developed.  Pulmonary:     Effort: Pulmonary effort is normal.  Musculoskeletal:        General: Normal range of motion.  Skin:    General: Skin is warm.  Neurological:     Mental Status: She is alert.     Neurovascular status intact muscle strength adequate range of motion adequate with the patient noted to have incurvated medial borders of the hallux bilateral painful when pressed inability to wear shoe gear without difficulty and has tried trimming techniques soaking techniques with mild redness no drainage noted.  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  Submerge your foot or feet in the solution and soak for 20 minutes.  This soak should be done twice a day.  Next, remove your foot or feet from  solution, blot dry the affected area. Apply ointment and cover if instructed by your doctor.   IF YOUR SKIN BECOMES IRRITATED WHILE USING THESE INSTRUCTIONS, IT IS OKAY TO SWITCH TO  WHITE VINEGAR AND WATER.  As another alternative soak, you may use antibacterial soap and water.  Monitor for any signs/symptoms of infection. Call the office immediately if any occur or go directly to the emergency room. Call with any questions/concerns.

## 2023-12-04 NOTE — Telephone Encounter (Signed)
 Patient called inquiring about applying ointment and cover if instructed by doctor. I tried to assist her, Patient may need additional assistance with instructions. Contact telephone number [phone]

## 2023-12-04 NOTE — Telephone Encounter (Signed)
 Dr. Odis Hollingshead,  Shelley Spencer 78 year old female is requesting preoperative cardiac evaluation for colonoscopy/EGD.  Procedure is scheduled for 01/02/2024.  She was last seen by you in clinic on 09/09/2023.  During that time she remained stable from a cardiac standpoint.  Her PMH includes hypertension, hyperlipidemia, hyperglycemia, chronic kidney disease stage III, former smoker, moderate CAD, history of right pontine stroke, sleep apnea on device therapy, Aortic atherosclerosis.  She also has history of CVA.  She was transition from aspirin to Plavix.  She is statin intolerant.   Formally under the care of Dr. Rozell Searing Custovic who last saw MAITRI SCHNOEBELEN back in December 2024. I am seeing her for the first time to re-establishing care.    Patient is being followed to the practice given her history of known CAC, CAD based on her last coronary CTA, aortic atherosclerosis, and other various cardiovascular risk factors.  May her Plavix be held prior to her procedure?  Thank you for your help.  Please direct your response to CV DIV preop pool.  Thomasene Ripple. Shadell Brenn NP-C     12/04/2023, 11:56 AM [facility] [address] Office [phone] Fax [phone]

## 2023-12-05 ENCOUNTER — Telehealth: Payer: Self-pay | Admitting: *Deleted

## 2023-12-05 ENCOUNTER — Telehealth: Payer: Self-pay

## 2023-12-05 NOTE — Telephone Encounter (Signed)
 Pt has been scheduled tele preop appt 12/16/23. Med rec and consent are done.      Patient Consent for Virtual Visit        Shelley Spencer has provided verbal consent on 12/05/2023 for a virtual visit (video or telephone).   CONSENT FOR VIRTUAL VISIT FOR:  Shelley Spencer  By participating in this virtual visit I agree to the following:  I hereby voluntarily request, consent and authorize [facility] and its employed or contracted physicians, physician assistants, nurse practitioners or other licensed health care professionals (the Practitioner), to provide me with telemedicine health care services (the "Services") as deemed necessary by the treating Practitioner. I acknowledge and consent to receive the Services by the Practitioner via telemedicine. I understand that the telemedicine visit will involve communicating with the Practitioner through live audiovisual communication technology and the disclosure of certain medical information by electronic transmission. I acknowledge that I have been given the opportunity to request an in-person assessment or other available alternative prior to the telemedicine visit and am voluntarily participating in the telemedicine visit.  I understand that I have the right to withhold or withdraw my consent to the use of telemedicine in the course of my care at any time, without affecting my right to future care or treatment, and that the Practitioner or I may terminate the telemedicine visit at any time. I understand that I have the right to inspect all information obtained and/or recorded in the course of the telemedicine visit and may receive copies of available information for a reasonable fee.  I understand that some of the potential risks of receiving the Services via telemedicine include:  Delay or interruption in medical evaluation due to technological equipment failure or disruption; Information transmitted may not be sufficient (e.g. poor resolution  of images) to allow for appropriate medical decision making by the Practitioner; and/or  In rare instances, security protocols could fail, causing a breach of personal health information.  Furthermore, I acknowledge that it is my responsibility to provide information about my medical history, conditions and care that is complete and accurate to the best of my ability. I acknowledge that Practitioner's advice, recommendations, and/or decision may be based on factors not within their control, such as incomplete or inaccurate data provided by me or distortions of diagnostic images or specimens that may result from electronic transmissions. I understand that the practice of medicine is not an exact science and that Practitioner makes no warranties or guarantees regarding treatment outcomes. I acknowledge that a copy of this consent can be made available to me via my patient portal [facility] MyChart), or I can request a printed copy by calling the office of [facility].    I understand that my insurance will be billed for this visit.   I have read or had this consent read to me. I understand the contents of this consent, which adequately explains the benefits and risks of the Services being provided via telemedicine.  I have been provided ample opportunity to ask questions regarding this consent and the Services and have had my questions answered to my satisfaction. I give my informed consent for the services to be provided through the use of telemedicine in my medical care

## 2023-12-05 NOTE — Telephone Encounter (Signed)
 Pt has been scheduled tele preop appt 12/16/23. Med rec and consent are done.

## 2023-12-05 NOTE — Telephone Encounter (Signed)
 Notified patient of Plavix 5 day hold- patient verbalized understanding

## 2023-12-05 NOTE — Telephone Encounter (Signed)
   Name: Shelley Spencer  DOB: 1946/02/21  MRN: [id number]  Primary Cardiologist: [name], DO   Preoperative team, please contact this patient and set up a phone call appointment for further preoperative risk assessment. Please obtain consent and complete medication review. Thank you for your help.  I confirm that guidance regarding antiplatelet and oral anticoagulation therapy has been completed and, if necessary, noted below.  Her Plavix may be held for 5 days prior to her procedure.  Please resume as soon as hemostasis is achieved at the discretion of the surgeon.  I also confirmed the patient resides in the state of [place]. As per [facility] Medical Board telemedicine laws, the patient must reside in the state in which the provider is licensed.   Ronney Asters, NP 12/05/2023, 11:24 AM [facility]

## 2023-12-10 ENCOUNTER — Other Ambulatory Visit: Payer: Self-pay

## 2023-12-12 ENCOUNTER — Encounter: Payer: Self-pay | Admitting: Podiatry

## 2023-12-12 ENCOUNTER — Ambulatory Visit: Admitting: Podiatry

## 2023-12-12 ENCOUNTER — Other Ambulatory Visit (HOSPITAL_BASED_OUTPATIENT_CLINIC_OR_DEPARTMENT_OTHER): Payer: Self-pay

## 2023-12-12 DIAGNOSIS — L539 Erythematous condition, unspecified: Secondary | ICD-10-CM

## 2023-12-12 MED ORDER — DOXYCYCLINE HYCLATE 100 MG PO TABS
100.0000 mg | ORAL_TABLET | Freq: Two times a day (BID) | ORAL | 0 refills | Status: DC
  Filled 2023-12-12: qty 20, 10d supply, fill #0

## 2023-12-12 NOTE — Progress Notes (Signed)
 Subjective:  Patient ID: Shelley Spencer, female    DOB: September 23, 1946,  MRN: [id number]  Chief Complaint  Patient presents with   Nail Problem    rm 7: right toe is infected. Pt had bilateral ingrowns removed from 1st toes last week. Noticed on Monday that they were red and weeping fluid. Toes today are red.    78 y.o. female presents with the above complaint.  Patient presents with right hallux erythema/paronychia.  Patient states that she had an ingrown removed by Dr. Charlsie Merles last week noticed some redness wanted get it evaluated currently not taking antibiotics denies any other acute complaints would like to discuss treatment options for this   Review of Systems: Negative except as noted in the HPI. Denies N/V/F/Ch.  Past Medical History:  Diagnosis Date   Anemia    Anxiety    Arthritis    Asthma    Bicornuate uterus    double cervix   CAD (coronary artery disease)    Colon polyp    Depression    Deviated septum    DJD (degenerative joint disease)    L-Spine   Fibroids    GERD (gastroesophageal reflux disease)    Hematuria    History of hiatal hernia    HNP (herniated nucleus pulposus)    lower back   Hyperlipidemia    Hypertension    MVP (mitral valve prolapse)    pt denies, pt states she had a Echo ?2008 and it did not show MVP   Pre-diabetes    Retinal tear 2005   Small right retinal tear   Shingles 1980's   on her waist   Sleep apnea    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 , Disp: , Rfl:    albuterol (VENTOLIN HFA) 108 (90 Base) MCG/ACT inhaler, Inhale 1-2 puffs into the lungs every 6 (six) hours as needed for wheezing or shortness of breath. , Disp: , Rfl:    Alirocumab (PRALUENT) 150 MG/ML SOAJ,  Inject 1 mL (150 mg total) into the skin every 14 (fourteen) days., Disp: 6 mL, Rfl: 3   ALPRAZolam (XANAX) 0.25 MG tablet, Take 1 tablet (0.25 mg total) by mouth 3 (three) times daily as needed for anxiety., Disp: 90 tablet, Rfl: 5   budesonide-formoterol (SYMBICORT) 160-4.5 MCG/ACT inhaler, Inhale 2 puffs into the lungs daily as needed (shortness of breath)., Disp: , Rfl:    CALCIUM-VITAMIN D PO, Take 1 tablet by mouth daily., Disp: , Rfl:    cetirizine (ZYRTEC) 10 MG tablet, Take 10 mg by mouth daily. (Patient not taking: Reported on 12/01/2023), Disp: , Rfl:    clopidogrel (PLAVIX) 75 MG tablet, Take 1 tablet (75 mg total) by mouth daily., Disp: 90 tablet, Rfl: 3   dexlansoprazole (DEXILANT) 60 MG capsule, Take 1 capsule (60 mg total) by mouth every morning 1/2 to 1 hour before morning meal., Disp: 30 capsule, Rfl: 2   dexlansoprazole (DEXILANT) 60 MG capsule, Take 1 capsule (60 mg total) by mouth in the morning 1/2 to 1 hour before meal., Disp: 30 capsule, Rfl: 11   doxycycline (MONODOX) 100 MG capsule, Take 1 capsule (100 mg total) by mouth 2 (two) times daily. (Patient not taking: Reported  on 12/01/2023), Disp: 14 capsule, Rfl: 0   furosemide (LASIX) 20 MG tablet, Take ONE-HALF tablet (10 mg total) by mouth daily., Disp: 45 tablet, Rfl: 1   furosemide (LASIX) 20 MG tablet, Take 1 tablet (20 mg total) by mouth daily. (Patient not taking: Reported on 12/01/2023), Disp: 90 tablet, Rfl: 3   gabapentin (NEURONTIN) 300 MG capsule, Take 300 mg by mouth at bedtime., Disp: , Rfl:    gabapentin (NEURONTIN) 300 MG capsule, Take 1 capsule (300 mg total) by mouth daily., Disp: 90 capsule, Rfl: 3   hyoscyamine (ANASPAZ) 0.125 MG TBDP disintergrating tablet, Dissolve 1 tablet on the tongue and allow to dissolve as needed up to three times a day for abdominal pain or cramping, Disp: 45 tablet, Rfl: 3   magnesium gluconate (MAGONATE) 500 MG tablet, Take 500 mg by mouth daily., Disp: , Rfl:    Multiple  Vitamins-Minerals (MULTIVITAMIN PO), Take 1 tablet by mouth daily., Disp: , Rfl:    nitroGLYCERIN (NITROSTAT) 0.4 MG SL tablet, Place 1 tablet (0.4 mg total) under the tongue every 5 (five) minutes as needed for chest pain. (Patient not taking: Reported on 12/01/2023), Disp: 30 tablet, Rfl: 2   NON FORMULARY, CPAP, Disp: , Rfl:    olmesartan (BENICAR) 20 MG tablet, Take 1 tablet (20 mg total) by mouth every evening., Disp: 90 tablet, Rfl: 3   OXYGEN, Inhale 3 L into the lungs at bedtime., Disp: , Rfl:    pantoprazole (PROTONIX) 40 MG tablet, Take 1 tablet (40 mg total) by mouth daily. (Patient not taking: Reported on 12/01/2023), Disp: 90 tablet, Rfl: 3   PARoxetine (PAXIL) 40 MG tablet, Take 1 tablet (40 mg total) by mouth daily., Disp: 30 tablet, Rfl: 5   PARoxetine (PAXIL) 40 MG tablet, Take 1 tablet (40 mg total) by mouth daily. (Patient not taking: Reported on 12/01/2023), Disp: 90 tablet, Rfl: 0   potassium chloride (KLOR-CON M10) 10 MEQ tablet, Take 1 tablet (10 mEq total) by mouth daily with food (Patient not taking: Reported on 12/01/2023), Disp: 90 tablet, Rfl: 3   predniSONE (STERAPRED UNI-PAK 21 TAB) 5 MG (21) TBPK tablet, Take as directed on packaging. (Patient not taking: Reported on 12/01/2023), Disp: 21 tablet, Rfl: 0   Sodium Sulfate-Mag Sulfate-KCl (SUTAB) [phone] MG TABS, Use as directed for colonoscopy. MANUFACTURER CODES!! BIN: [id number] PCN: CN GROUP: [id number] MEMBER ID: [id number];[employer] AS SECONDARY INSURANCE ;NO PRIOR AUTHORIZATION, Disp: 24 tablet, Rfl: 0   tirzepatide (MOUNJARO) 15 MG/0.5ML Pen, Inject 15mg  under the skin once weekly, Disp: 2 mL, Rfl: 11   traZODone (DESYREL) 150 MG tablet, Take 150 mg by mouth at bedtime., Disp: , Rfl:    traZODone (DESYREL) 150 MG tablet, Take 1 tablet (150 mg total) by mouth at bedtime. (Patient not taking: Reported on 12/01/2023), Disp: 90 tablet, Rfl: 0   vitamin B-12 (CYANOCOBALAMIN) 1000 MCG tablet, Take 1,000 mcg by mouth daily., Disp: ,  Rfl:   Social History   Tobacco Use  Smoking Status Former   Current packs/day: 0.00   Average packs/day: 1 pack/day for 20.0 years (20.0 ttl pk-yrs)   Types: Cigarettes   Start date: 09/24/1959   Quit date: 09/24/1979   Years since quitting: 44.2  Smokeless Tobacco Never    Allergies  Allergen Reactions   Amlodipine Other (See Comments) and Swelling    Legs swell some and she doesn't like it.   Fenofibrate Other (See Comments)    Leg pain   Omeprazole Other (See Comments)  Unknown   Simvastatin Other (See Comments)    Myalgia, leg pains   Singulair [Montelukast Sodium] Other (See Comments)    Affected mood   Statins Other (See Comments)    Myalgia, leg pains   Objective:  There were no vitals filed for this visit. There is no height or weight on file to calculate BMI. Constitutional Well developed. Well nourished.  Vascular Dorsalis pedis pulses palpable bilaterally. Posterior tibial pulses palpable bilaterally. Capillary refill normal to all digits.  No cyanosis or clubbing noted. Pedal hair growth normal.  Neurologic Normal speech. Oriented to person, place, and time. Epicritic sensation to light touch grossly present bilaterally.  Dermatologic Right hallux medial lateral border ingrown no signs of recurrence noted paronychia noted circumferentially.  No cellulitis noted.  No other abnormalities identified  Orthopedic: Normal joint ROM without pain or crepitus bilaterally. No visible deformities. No bony tenderness.   Radiographs: None Assessment:   1. Erythema    Plan:  Patient was evaluated and treated and all questions answered.  Right hallux paronychia with history of ingrown nail removal -All questions and concerns were discussed with the patient extensive detail given the presence of paronychia patient will benefit from doxycycline doxycycline was sent to the pharmacy I encouraged her to finish the course she states understand she will see Dr. Charlsie Merles  back again in 2 weeks for final assessment  No follow-ups on file.

## 2023-12-13 ENCOUNTER — Other Ambulatory Visit (HOSPITAL_BASED_OUTPATIENT_CLINIC_OR_DEPARTMENT_OTHER): Payer: Self-pay

## 2023-12-15 ENCOUNTER — Other Ambulatory Visit (HOSPITAL_BASED_OUTPATIENT_CLINIC_OR_DEPARTMENT_OTHER): Payer: Self-pay

## 2023-12-15 NOTE — Progress Notes (Unsigned)
 Virtual Visit via Telephone Note   Because of Shelley Spencer co-morbid illnesses, she is at least at moderate risk for complications without adequate follow up.  This format is felt to be most appropriate for this patient at this time.  Due to technical limitations with video connection (technology), today's appointment will be conducted as an audio only telehealth visit, and Shelley Spencer verbally agreed to proceed in this manner.   All issues noted in this document were discussed and addressed.  No physical exam could be performed with this format.  Evaluation Performed:  Preoperative cardiovascular risk assessment _____________   Date:  12/16/2023   Patient ID:  Shelley Spencer, DOB 09/25/1945, MRN [id number] Patient Location:  Home Provider location:   Office  Primary Care Provider:  [name], MD Primary Cardiologist:  [name], DO  Chief Complaint / Patient Profile   78 y.o. y/o female with a h/o CAD, aortic atherosclerosis, right pontine stroke, sleep apnea on CPAP, hypertension, hyperlipidemia, CKD stage III, former tobacco abuse who is pending colonoscopy/endoscopy with [facility] on 01/02/24 and presents today for telephonic preoperative cardiovascular risk assessment.  History of Present Illness    Shelley Spencer is a 78 y.o. female who presents via audio/video conferencing for a telehealth visit today.  Pt was last seen in cardiology clinic on 09/09/23 by Dr. Odis Hollingshead.  At that time Shelley Spencer was doing well.  The patient is now pending procedure as outlined above. Since her last visit, she denies chest pain, shortness of breath, lower extremity edema, fatigue, palpitations, melena, hematuria, hemoptysis, diaphoresis, weakness, presyncope, syncope, orthopnea, and PND.   Past Medical History    Past Medical History:  Diagnosis Date   Anemia    Anxiety    Arthritis    Asthma    Bicornuate uterus    double cervix   CAD (coronary artery disease)    Colon polyp     Depression    Deviated septum    DJD (degenerative joint disease)    L-Spine   Fibroids    GERD (gastroesophageal reflux disease)    Hematuria    History of hiatal hernia    HNP (herniated nucleus pulposus)    lower back   Hyperlipidemia    Hypertension    MVP (mitral valve prolapse)    pt denies, pt states she had a Echo ?2008 and it did not show MVP   Pre-diabetes    Retinal tear 2005   Small right retinal tear   Shingles 1980's   on her waist   Sleep apnea    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   Past Surgical History:  Procedure Laterality Date   ANTERIOR CERVICAL DECOMP/DISCECTOMY FUSION N/A 09/05/2017   Procedure: Cervical four-five, Cervical five-six, Cervical six-seven Anterior cervical decompression/discectomy/fusion;  Surgeon: Maeola Harman, MD;  Location: [facility];  Service: Neurosurgery;  Laterality: N/A;   bone spur surgery Bilateral    x 3   CARDIOVASCULAR STRESS TEST  02/08/2014   CATARACT EXTRACTION Bilateral    COLONOSCOPY     CYST EXCISION Left 09/29/2018   Procedure: LEFT MIDDLE FINGER CYST REMOVAL AND DEBRIDEMENT OF DISTAL INTERPHALANGEAL JOINT;  Surgeon: Cindee Salt, MD;  Location: [facility];  Service: Orthopedics;  Laterality: Left;   FINGER SURGERY Left    left third finger mucoid cyst removed   FOOT NEUROMA SURGERY Bilateral    HYSTEROSCOPY     D&C PMB Endo Cx Polyp   KNEE ARTHROSCOPY WITH  MENISCAL REPAIR Right 2016   --Kristeen Miss, MD   LAPAROSCOPIC HYSTERECTOMY  7/209/10   R-TLH/BSO  Fibroids, BAck Pain, Adenomyosis   LUMBAR FUSION     removal vaginal septum     TOTAL KNEE ARTHROPLASTY Right 05/17/2019   Procedure: TOTAL KNEE ARTHROPLASTY;  Surgeon: Ollen Gross, MD;  Location: [facility] ORS;  Service: Orthopedics;  Laterality: Right;    TOTAL KNEE ARTHROPLASTY Left 07/29/2022   Procedure: TOTAL KNEE ARTHROPLASTY;  Surgeon: Ollen Gross, MD;  Location: [facility] ORS;  Service: Orthopedics;  Laterality: Left;   TUBAL LIGATION       Allergies  Allergies  Allergen Reactions   Amlodipine Other (See Comments) and Swelling    Legs swell some and she doesn't like it.   Fenofibrate Other (See Comments)    Leg pain   Omeprazole Other (See Comments)    Unknown   Simvastatin Other (See Comments)    Myalgia, leg pains   Singulair [Montelukast Sodium] Other (See Comments)    Affected mood   Statins Other (See Comments)    Myalgia, leg pains    Home Medications    Prior to Admission medications   Medication Sig Start Date End Date Taking? Authorizing Provider  acetaminophen (TYLENOL) 650 MG CR tablet Take 1,300 mg by mouth every 8 (eight) hours as needed for pain.     [provider]  albuterol (VENTOLIN HFA) 108 (90 Base) MCG/ACT inhaler Inhale 1-2 puffs into the lungs every 6 (six) hours as needed for wheezing or shortness of breath.     [provider]  Alirocumab (PRALUENT) 150 MG/ML SOAJ Inject 1 mL (150 mg total) into the skin every 14 (fourteen) days. 06/16/23     ALPRAZolam (XANAX) 0.25 MG tablet Take 1 tablet (0.25 mg total) by mouth 3 (three) times daily as needed for anxiety. 08/20/23     budesonide-formoterol (SYMBICORT) 160-4.5 MCG/ACT inhaler Inhale 2 puffs into the lungs daily as needed (shortness of breath).    [provider]  CALCIUM-VITAMIN D PO Take 1 tablet by mouth daily.    [provider]  cetirizine (ZYRTEC) 10 MG tablet Take 10 mg by mouth daily. Patient not taking: Reported on 12/01/2023    [provider]  clopidogrel (PLAVIX) 75 MG tablet Take 1 tablet (75 mg total) by mouth daily. 11/18/23   Tolia, Sunit, DO  dexlansoprazole (DEXILANT) 60 MG capsule Take 1 capsule (60 mg total) by mouth every morning 1/2 to 1 hour before morning meal. 11/05/23     dexlansoprazole (DEXILANT) 60 MG capsule Take 1 capsule (60 mg total) by mouth in the morning 1/2 to 1 hour before meal. 11/14/23     doxycycline (MONODOX) 100 MG capsule Take 1 capsule (100 mg total) by  mouth 2 (two) times daily. Patient not taking: Reported on 12/01/2023 09/09/23     doxycycline (VIBRA-TABS) 100 MG tablet Take 1 tablet (100 mg total) by mouth 2 (two) times daily. 12/12/23   Candelaria Stagers, DPM  furosemide (LASIX) 20 MG tablet Take ONE-HALF tablet (10 mg total) by mouth daily. 10/09/23     furosemide (LASIX) 20 MG tablet Take 1 tablet (20 mg total) by mouth daily. Patient not taking: Reported on 12/01/2023 11/20/23     gabapentin (NEURONTIN) 300 MG capsule Take 300 mg by mouth at bedtime. 03/02/21   [provider]  gabapentin (NEURONTIN) 300 MG capsule Take 1 capsule (300 mg total) by mouth daily. 10/30/23     hyoscyamine (ANASPAZ) 0.125 MG  TBDP disintergrating tablet Dissolve 1 tablet on the tongue and allow to dissolve as needed up to three times a day for abdominal pain or cramping 11/05/23     magnesium gluconate (MAGONATE) 500 MG tablet Take 500 mg by mouth daily.    [provider]  Multiple Vitamins-Minerals (MULTIVITAMIN PO) Take 1 tablet by mouth daily.    [provider]  nitroGLYCERIN (NITROSTAT) 0.4 MG SL tablet Place 1 tablet (0.4 mg total) under the tongue every 5 (five) minutes as needed for chest pain. Patient not taking: Reported on 12/01/2023 08/14/20   Elder Negus, MD  NON FORMULARY CPAP    [provider]  olmesartan (BENICAR) 20 MG tablet Take 1 tablet (20 mg total) by mouth every evening. 02/10/23     OXYGEN Inhale 3 L into the lungs at bedtime.    [provider]  pantoprazole (PROTONIX) 40 MG tablet Take 1 tablet (40 mg total) by mouth daily. Patient not taking: Reported on 12/01/2023 04/18/21   Unk Lightning, PA  PARoxetine (PAXIL) 40 MG tablet Take 1 tablet (40 mg total) by mouth daily. 08/20/23     PARoxetine (PAXIL) 40 MG tablet Take 1 tablet (40 mg total) by mouth daily. Patient not taking: Reported on 12/01/2023 11/11/23     potassium chloride (KLOR-CON M10) 10 MEQ tablet Take 1 tablet (10 mEq total)  by mouth daily with food Patient not taking: Reported on 12/01/2023 11/21/23     predniSONE (STERAPRED UNI-PAK 21 TAB) 5 MG (21) TBPK tablet Take as directed on packaging. Patient not taking: Reported on 12/01/2023 09/09/23     Sodium Sulfate-Mag Sulfate-KCl (SUTAB) [phone] MG TABS Use as directed for colonoscopy. MANUFACTURER CODES!! BIN: [id number] PCN: CN GROUP: [id number] MEMBER ID: [id number];[employer] AS SECONDARY INSURANCE ;NO PRIOR AUTHORIZATION 12/01/23   Doree Albee, PA-C  tirzepatide [facility]) 15 MG/0.5ML Pen Inject 15mg  under the skin once weekly 11/05/22     traZODone (DESYREL) 150 MG tablet Take 150 mg by mouth at bedtime.    [provider]  traZODone (DESYREL) 150 MG tablet Take 1 tablet (150 mg total) by mouth at bedtime. Patient not taking: Reported on 12/01/2023 11/11/23     vitamin B-12 (CYANOCOBALAMIN) 1000 MCG tablet Take 1,000 mcg by mouth daily.    [provider]  potassium chloride (KLOR-CON) 10 MEQ tablet Take 1 tablet (10 mEq total) by mouth daily with food 10/14/22 11/21/23      Physical Exam    Vital Signs:  Shelley Spencer does not have vital signs available for review today.  Given telephonic nature of communication, physical exam is limited. AAOx3. NAD. Normal affect.  Speech and respirations are unlabored.  Accessory Clinical Findings    None  Assessment & Plan    1.  Preoperative Cardiovascular Risk Assessment: According to the Revised Cardiac Risk Index (RCRI), her Perioperative Risk of Major Cardiac Event is (%): 0.9. Her Functional Capacity in METs is: 6.05 according to the Duke Activity Status Index (DASI). The patient is doing well from a cardiac perspective. Therefore, based on ACC/AHA guidelines, the patient would be at acceptable risk for the planned procedure without further cardiovascular testing.   The patient was advised that if she develops new symptoms prior to surgery to contact our office to arrange for a follow-up visit, and she  verbalized understanding.  Per office protocol, she may hold Plavix for 5 days prior to procedure and should resume as soon as hemodynamically stable postoperatively.  A copy  of this note will be routed to requesting surgeon.  Time:   Today, I have spent 10 minutes with the patient with telehealth technology discussing medical history, symptoms, and management plan.    Levi Aland, NP-C  12/16/2023, 9:05 AM [address], [address] Office [phone] Fax [phone]

## 2023-12-16 ENCOUNTER — Ambulatory Visit: Attending: Nurse Practitioner | Admitting: Nurse Practitioner

## 2023-12-16 DIAGNOSIS — Z01810 Encounter for preprocedural cardiovascular examination: Secondary | ICD-10-CM

## 2023-12-22 ENCOUNTER — Other Ambulatory Visit (HOSPITAL_BASED_OUTPATIENT_CLINIC_OR_DEPARTMENT_OTHER): Payer: Self-pay

## 2023-12-23 ENCOUNTER — Other Ambulatory Visit (HOSPITAL_BASED_OUTPATIENT_CLINIC_OR_DEPARTMENT_OTHER): Payer: Self-pay

## 2023-12-23 ENCOUNTER — Other Ambulatory Visit: Payer: Self-pay

## 2023-12-23 MED ORDER — NEBIVOLOL HCL 5 MG PO TABS
5.0000 mg | ORAL_TABLET | Freq: Every day | ORAL | 11 refills | Status: AC
Start: 2023-12-23 — End: ?
  Filled 2023-12-23 (×2): qty 30, 30d supply, fill #0
  Filled 2024-01-15: qty 30, 30d supply, fill #1
  Filled 2024-02-13: qty 30, 30d supply, fill #2

## 2023-12-23 MED ORDER — MOUNJARO 15 MG/0.5ML ~~LOC~~ SOAJ
15.0000 mg | SUBCUTANEOUS | 3 refills | Status: AC
  Filled 2023-12-23 (×2): qty 2, 28d supply, fill #0
  Filled 2024-02-13: qty 2, 28d supply, fill #1
  Filled 2024-08-27: qty 2, 28d supply, fill #2

## 2023-12-23 MED ORDER — PRALUENT 150 MG/ML ~~LOC~~ SOAJ
150.0000 mg | SUBCUTANEOUS | 3 refills | Status: DC
  Filled 2023-12-23: qty 6, 84d supply, fill #0

## 2023-12-24 ENCOUNTER — Other Ambulatory Visit (HOSPITAL_BASED_OUTPATIENT_CLINIC_OR_DEPARTMENT_OTHER): Payer: Self-pay

## 2023-12-24 ENCOUNTER — Other Ambulatory Visit: Payer: Self-pay

## 2023-12-24 MED ORDER — MOUNJARO 15 MG/0.5ML ~~LOC~~ SOAJ
15.0000 mg | SUBCUTANEOUS | 5 refills | Status: AC
Start: 2023-12-23 — End: ?
  Filled 2024-01-15: qty 2, 28d supply, fill #0

## 2023-12-25 ENCOUNTER — Other Ambulatory Visit (HOSPITAL_BASED_OUTPATIENT_CLINIC_OR_DEPARTMENT_OTHER): Payer: Self-pay

## 2023-12-25 ENCOUNTER — Other Ambulatory Visit: Payer: Self-pay

## 2023-12-25 ENCOUNTER — Encounter: Payer: Self-pay | Admitting: Gastroenterology

## 2023-12-29 ENCOUNTER — Other Ambulatory Visit (HOSPITAL_BASED_OUTPATIENT_CLINIC_OR_DEPARTMENT_OTHER): Payer: Self-pay

## 2023-12-29 MED ORDER — EZETIMIBE 10 MG PO TABS
10.0000 mg | ORAL_TABLET | Freq: Every day | ORAL | 3 refills | Status: DC
  Filled 2023-12-29: qty 30, 30d supply, fill #0

## 2024-01-01 ENCOUNTER — Ambulatory Visit: Admitting: Podiatry

## 2024-01-02 ENCOUNTER — Ambulatory Visit: Admitting: Gastroenterology

## 2024-01-02 ENCOUNTER — Encounter: Payer: Self-pay | Admitting: Gastroenterology

## 2024-01-02 VITALS — BP 141/70 | HR 62 | Temp 98.1°F | Resp 12 | Ht 62.0 in | Wt 147.0 lb

## 2024-01-02 DIAGNOSIS — R1310 Dysphagia, unspecified: Secondary | ICD-10-CM

## 2024-01-02 DIAGNOSIS — K648 Other hemorrhoids: Secondary | ICD-10-CM

## 2024-01-02 DIAGNOSIS — K5730 Diverticulosis of large intestine without perforation or abscess without bleeding: Secondary | ICD-10-CM | POA: Diagnosis not present

## 2024-01-02 DIAGNOSIS — Z1211 Encounter for screening for malignant neoplasm of colon: Secondary | ICD-10-CM | POA: Diagnosis not present

## 2024-01-02 DIAGNOSIS — K3189 Other diseases of stomach and duodenum: Secondary | ICD-10-CM | POA: Diagnosis not present

## 2024-01-02 DIAGNOSIS — K2970 Gastritis, unspecified, without bleeding: Secondary | ICD-10-CM

## 2024-01-02 DIAGNOSIS — K319 Disease of stomach and duodenum, unspecified: Secondary | ICD-10-CM | POA: Diagnosis not present

## 2024-01-02 DIAGNOSIS — K644 Residual hemorrhoidal skin tags: Secondary | ICD-10-CM | POA: Diagnosis not present

## 2024-01-02 DIAGNOSIS — D125 Benign neoplasm of sigmoid colon: Secondary | ICD-10-CM

## 2024-01-02 DIAGNOSIS — K449 Diaphragmatic hernia without obstruction or gangrene: Secondary | ICD-10-CM | POA: Diagnosis not present

## 2024-01-02 DIAGNOSIS — K219 Gastro-esophageal reflux disease without esophagitis: Secondary | ICD-10-CM

## 2024-01-02 MED ORDER — SODIUM CHLORIDE 0.9 % IV SOLN: 500.0000 mL | Freq: Once | INTRAVENOUS | Status: AC

## 2024-01-02 NOTE — Op Note (Addendum)
 [facility] Patient Name: Shelley Spencer Procedure Date: 01/02/2024 1:37 PM MRN: [id number] Endoscopist: Napoleon Form , MD, [id number] Age: 78 Referring MD:  Date of Birth: Dec 13, 1945 Gender: Female Account #: [id number] Procedure:                Colonoscopy Indications:              High risk colon cancer surveillance: Personal                            history of colonic polyps Medicines:                Monitored Anesthesia Care Procedure:                Pre-Anesthesia Assessment:                           - Prior to the procedure, a History and Physical                            was performed, and patient medications and                            allergies were reviewed. The patient's tolerance of                            previous anesthesia was also reviewed. The risks                            and benefits of the procedure and the sedation                            options and risks were discussed with the patient.                            All questions were answered, and informed consent                            was obtained. Prior Anticoagulants: The patient                            last took Plavix (clopidogrel) 5 days prior to the                            procedure. ASA Grade Assessment: III - A patient                            with severe systemic disease. After reviewing the                            risks and benefits, the patient was deemed in                            satisfactory condition to undergo the procedure.  After obtaining informed consent, the colonoscope                            was passed under direct vision. Throughout the                            procedure, the patient's blood pressure, pulse, and                            oxygen saturations were monitored continuously. The                            [id number]-HQ190L Colonoscope [id number] was introduced                            through the anus and  advanced to the the cecum,                            identified by appendiceal orifice and ileocecal                            valve. The colonoscopy was performed without                            difficulty. The patient tolerated the procedure                            well. The quality of the bowel preparation was                            adequate. The ileocecal valve, appendiceal orifice,                            and rectum were photographed. Scope In: 1:52:51 PM Scope Out: 2:10:14 PM Scope Withdrawal Time: 0 hours 11 minutes 32 seconds  Total Procedure Duration: 0 hours 17 minutes 23 seconds  Findings:                 A 5 mm polyp was found in the sigmoid colon. The                            polyp was sessile. The polyp was removed with a                            cold snare. Resection and retrieval were complete.                           Scattered small-mouthed diverticula were found in                            the sigmoid colon, descending colon and ascending                            colon.  Non-bleeding external and internal hemorrhoids were                            found during retroflexion. The hemorrhoids were                            medium-sized. Complications:            No immediate complications. Estimated Blood Loss:     Estimated blood loss was minimal. Impression:               - One 5 mm polyp in the sigmoid colon, removed with                            a cold snare. Resected and retrieved.                           - Diverticulosis in the sigmoid colon, in the                            descending colon and in the ascending colon.                           - Non-bleeding external and internal hemorrhoids. Recommendation:           - Patient has a contact number available for                            emergencies. The signs and symptoms of potential                            delayed complications were discussed with the                             patient. Return to normal activities tomorrow.                            Written discharge instructions were provided to the                            patient.                           - Resume previous diet.                           - Continue present medications.                           - Await pathology results.                           - No repeat colonoscopy due to age.                           - Resume Plavix (clopidogrel) at prior dose  tomorrow. Refer to managing physician for further                            adjustment of therapy. Napoleon Form, MD 01/02/2024 2:44:52 PM This report has been signed electronically.

## 2024-01-02 NOTE — Progress Notes (Signed)
 Report to PACU, RN, vss, BBS= Clear.

## 2024-01-02 NOTE — Patient Instructions (Signed)

## 2024-01-02 NOTE — Op Note (Addendum)
 [facility] Patient Name: Shelley Spencer Procedure Date: 01/02/2024 1:37 PM MRN: [id number] Endoscopist: Napoleon Form , MD, [id number] Age: 78 Referring MD:  Date of Birth: 15-Jul-1946 Gender: Female Account #: [id number] Procedure:                Upper GI endoscopy Indications:              Dysphagia, Esophageal reflux symptoms that persist                            despite appropriate therapy Medicines:                Monitored Anesthesia Care Procedure:                Pre-Anesthesia Assessment:                           - Prior to the procedure, a History and Physical                            was performed, and patient medications and                            allergies were reviewed. The patient's tolerance of                            previous anesthesia was also reviewed. The risks                            and benefits of the procedure and the sedation                            options and risks were discussed with the patient.                            All questions were answered, and informed consent                            was obtained. Prior Anticoagulants: The patient                            last took Plavix (clopidogrel) 5 days prior to the                            procedure. ASA Grade Assessment: III - A patient                            with severe systemic disease. After reviewing the                            risks and benefits, the patient was deemed in                            satisfactory condition to undergo the procedure.  After obtaining informed consent, the endoscope was                            passed under direct vision. Throughout the                            procedure, the patient's blood pressure, pulse, and                            oxygen saturations were monitored continuously. The                            GIF [id number] #[id number] was introduced through the                            mouth, and  advanced to the second part of duodenum.                            The upper GI endoscopy was accomplished without                            difficulty. The patient tolerated the procedure                            well. Scope In: Scope Out: Findings:                 The Z-line was regular and was found 40 cm from the                            incisors.                           A 4 cm hiatal hernia was present.                           No endoscopic abnormality was evident in the                            esophagus to explain the patient's complaint of                            dysphagia. It was decided, however, to proceed with                            dilation of the entire esophagus. The scope was                            withdrawn. Dilation was performed with a Maloney                            dilator with no resistance at 54 Fr. The dilation  site was examined following endoscope reinsertion                            and showed no change.                           Patchy mild inflammation characterized by                            congestion (edema), erythema and friability was                            found in the entire examined stomach. Biopsies were                            taken with a cold forceps for Helicobacter pylori                            testing.                           The cardia and gastric fundus were normal on                            retroflexion.                           The examined duodenum was normal. Complications:            No immediate complications. Estimated Blood Loss:     Estimated blood loss was minimal. Impression:               - Z-line regular, 40 cm from the incisors.                           - 4 cm hiatal hernia.                           - No endoscopic esophageal abnormality to explain                            patient's dysphagia. Esophagus dilated. Dilated.                           -  Gastritis. Biopsied.                           - Normal examined duodenum. Recommendation:           - Patient has a contact number available for                            emergencies. The signs and symptoms of potential                            delayed complications were discussed with the  patient. Return to normal activities tomorrow.                            Written discharge instructions were provided to the                            patient.                           - Resume previous diet.                           - Continue present medications.                           - Await pathology results.                           - Follow an antireflux regimen.                           - See the other procedure note for documentation of                            additional recommendations. Napoleon Form, MD 01/02/2024 2:51:41 PM This report has been signed electronically.

## 2024-01-02 NOTE — Progress Notes (Signed)
 Called to room to assist during endoscopic procedure.  Patient ID and intended procedure confirmed with present staff. Received instructions for my participation in the procedure from the performing physician.

## 2024-01-02 NOTE — Progress Notes (Signed)
 [facility] History and Physical   Primary Care Physician:  [name], MD   Reason for Procedure:  H/o colon polyps. GERD and dysphagia  Plan:    EGD and colonoscopy with possible interventions as needed     HPI: Shelley Spencer is a very pleasant 78 y.o. female here for EGD and colonoscopy for H/o colon polyps, GERD and dysphagia .  Please refer to office visit note by Quentin Mulling 12/01/23 for additional details  The risks and benefits as well as alternatives of endoscopic procedure(s) have been discussed and reviewed. All questions answered. The patient agrees to proceed.    Past Medical History:  Diagnosis Date   Anemia    Anxiety    Arthritis    Asthma    Bicornuate uterus    double cervix   CAD (coronary artery disease)    Colon polyp    Depression    Deviated septum    DJD (degenerative joint disease)    L-Spine   Fibroids    GERD (gastroesophageal reflux disease)    Hematuria    History of hiatal hernia    HNP (herniated nucleus pulposus)    lower back   Hyperlipidemia    Hypertension    MVP (mitral valve prolapse)    pt denies, pt states she had a Echo ?2008 and it did not show MVP   Osteopenia    Pre-diabetes    Retinal tear 2005   Small right retinal tear   Shingles 1980's   on her waist   Sleep apnea    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    Past Surgical History:  Procedure Laterality Date   ANTERIOR CERVICAL DECOMP/DISCECTOMY FUSION N/A 09/05/2017   Procedure: Cervical four-five, Cervical five-six, Cervical six-seven Anterior cervical decompression/discectomy/fusion;  Surgeon: Maeola Harman, MD;  Location: [facility] OR;  Service: Neurosurgery;  Laterality: N/A;   bone spur surgery Bilateral    x 3   CARDIOVASCULAR STRESS TEST  02/08/2014   CATARACT EXTRACTION Bilateral    COLONOSCOPY     CYST EXCISION Left 09/29/2018   Procedure: LEFT MIDDLE FINGER CYST REMOVAL AND DEBRIDEMENT OF DISTAL INTERPHALANGEAL JOINT;  Surgeon: Cindee Salt,  MD;  Location: [facility];  Service: Orthopedics;  Laterality: Left;   FINGER SURGERY Left    left third finger mucoid cyst removed   FOOT NEUROMA SURGERY Bilateral    HYSTEROSCOPY     D&C PMB Endo Cx Polyp   KNEE ARTHROSCOPY WITH MENISCAL REPAIR Right 2016   --Kristeen Miss, MD   LAPAROSCOPIC HYSTERECTOMY  7/209/10   R-TLH/BSO  Fibroids, BAck Pain, Adenomyosis   LUMBAR FUSION     removal vaginal septum     TOTAL KNEE ARTHROPLASTY Right 05/17/2019   Procedure: TOTAL KNEE ARTHROPLASTY;  Surgeon: Ollen Gross, MD;  Location: [facility] ORS;  Service: Orthopedics;  Laterality: Right;    TOTAL KNEE ARTHROPLASTY Left 07/29/2022   Procedure: TOTAL KNEE ARTHROPLASTY;  Surgeon: Ollen Gross, MD;  Location: [facility] ORS;  Service: Orthopedics;  Laterality: Left;   TUBAL LIGATION     UPPER GASTROINTESTINAL ENDOSCOPY      Prior to Admission medications   Medication Sig Start Date End Date Taking? Authorizing Provider  ALPRAZolam (XANAX) 0.25 MG tablet Take 1 tablet (0.25 mg total) by mouth 3 (three) times daily as needed for anxiety. 08/20/23  Yes   budesonide-formoterol (SYMBICORT) 160-4.5 MCG/ACT inhaler Inhale 2 puffs into the lungs daily as needed (shortness of breath).   Yes [provider]  CALCIUM-VITAMIN D PO Take 1 tablet by mouth daily.   Yes [provider]  dexlansoprazole (DEXILANT) 60 MG capsule Take 1 capsule (60 mg total) by mouth every morning 1/2 to 1 hour before morning meal. 11/05/23  Yes   furosemide (LASIX) 20 MG tablet Take ONE-HALF tablet (10 mg total) by mouth daily. 10/09/23  Yes   gabapentin (NEURONTIN) 300 MG capsule Take 300 mg by mouth at bedtime. 03/02/21  Yes [provider]  magnesium gluconate (MAGONATE) 500 MG tablet Take 500 mg by mouth daily.   Yes [provider]  Multiple Vitamins-Minerals (MULTIVITAMIN PO) Take 1 tablet by mouth daily.   Yes [provider]  nebivolol (BYSTOLIC) 5 MG tablet Take 1 tablet (5  mg total) by mouth daily. 12/23/23  Yes   NON FORMULARY CPAP   Yes [provider]  olmesartan (BENICAR) 20 MG tablet Take 1 tablet (20 mg total) by mouth every evening. 02/10/23  Yes   OXYGEN Inhale 3 L into the lungs at bedtime.   Yes [provider]  PARoxetine (PAXIL) 40 MG tablet Take 1 tablet (40 mg total) by mouth daily. 08/20/23  Yes   traZODone (DESYREL) 150 MG tablet Take 150 mg by mouth at bedtime.   Yes [provider]  vitamin B-12 (CYANOCOBALAMIN) 1000 MCG tablet Take 1,000 mcg by mouth daily.   Yes [provider]  acetaminophen (TYLENOL) 650 MG CR tablet Take 1,300 mg by mouth every 8 (eight) hours as needed for pain.     [provider]  albuterol (VENTOLIN HFA) 108 (90 Base) MCG/ACT inhaler Inhale 1-2 puffs into the lungs every 6 (six) hours as needed for wheezing or shortness of breath.     [provider]  Alirocumab (PRALUENT) 150 MG/ML SOAJ Inject 1 mL (150 mg total) into the skin every 14 (fourteen) days. 06/16/23     Alirocumab (PRALUENT) 150 MG/ML SOAJ Inject 1 mL (150 mg total) into the skin every 14 (fourteen) days. 12/23/23     cetirizine (ZYRTEC) 10 MG tablet Take 10 mg by mouth daily. Patient not taking: Reported on 12/01/2023    [provider]  clopidogrel (PLAVIX) 75 MG tablet Take 1 tablet (75 mg total) by mouth daily. 11/18/23   Tolia, Sunit, DO  dexlansoprazole (DEXILANT) 60 MG capsule Take 1 capsule (60 mg total) by mouth in the morning 1/2 to 1 hour before meal. 11/14/23     doxycycline (MONODOX) 100 MG capsule Take 1 capsule (100 mg total) by mouth 2 (two) times daily. Patient not taking: Reported on 01/02/2024 09/09/23     doxycycline (VIBRA-TABS) 100 MG tablet Take 1 tablet (100 mg total) by mouth 2 (two) times daily. 12/12/23   Candelaria Stagers, DPM  ezetimibe (ZETIA) 10 MG tablet Take 1 tablet (10 mg total) by mouth daily. Patient not taking: Reported on 01/02/2024 12/29/23     furosemide (LASIX) 20 MG  tablet Take 1 tablet (20 mg total) by mouth daily. Patient not taking: Reported on 12/01/2023 11/20/23     gabapentin (NEURONTIN) 300 MG capsule Take 1 capsule (300 mg total) by mouth daily. 10/30/23     hyoscyamine (ANASPAZ) 0.125 MG TBDP disintergrating tablet Dissolve 1 tablet on the tongue and allow to dissolve as needed up to three times a day for abdominal pain or cramping 11/05/23     nitroGLYCERIN (NITROSTAT) 0.4 MG SL tablet Place 1 tablet (0.4 mg total) under the tongue every 5 (five) minutes as needed for chest  pain. Patient not taking: Reported on 12/01/2023 08/14/20   Elder Negus, MD  pantoprazole (PROTONIX) 40 MG tablet Take 1 tablet (40 mg total) by mouth daily. Patient not taking: Reported on 12/01/2023 04/18/21   Unk Lightning, PA  PARoxetine (PAXIL) 40 MG tablet Take 1 tablet (40 mg total) by mouth daily. Patient not taking: Reported on 12/01/2023 11/11/23     potassium chloride (KLOR-CON M10) 10 MEQ tablet Take 1 tablet (10 mEq total) by mouth daily with food Patient not taking: Reported on 12/01/2023 11/21/23     predniSONE (STERAPRED UNI-PAK 21 TAB) 5 MG (21) TBPK tablet Take as directed on packaging. Patient not taking: Reported on 12/01/2023 09/09/23     Sodium Sulfate-Mag Sulfate-KCl (SUTAB) [id number] MG TABS Use as directed for colonoscopy. MANUFACTURER CODES!! BIN: [id number] PCN: CN GROUP: [id number] MEMBER ID: [id number];DGU AS SECONDARY INSURANCE ;NO PRIOR AUTHORIZATION 12/01/23   Doree Albee, PA-C  tirzepatide [facility]) 15 MG/0.5ML Pen Inject 15 mg into the skin once a week. 12/23/23     tirzepatide (MOUNJARO) 15 MG/0.5ML Pen Inject 15 mg into the skin once a week. 12/23/23     traZODone (DESYREL) 150 MG tablet Take 1 tablet (150 mg total) by mouth at bedtime. Patient not taking: Reported on 12/01/2023 11/11/23     potassium chloride (KLOR-CON) 10 MEQ tablet Take 1 tablet (10 mEq total) by mouth daily with food 10/14/22 11/21/23      Current Outpatient  Medications  Medication Sig Dispense Refill   ALPRAZolam (XANAX) 0.25 MG tablet Take 1 tablet (0.25 mg total) by mouth 3 (three) times daily as needed for anxiety. 90 tablet 5   budesonide-formoterol (SYMBICORT) 160-4.5 MCG/ACT inhaler Inhale 2 puffs into the lungs daily as needed (shortness of breath).     CALCIUM-VITAMIN D PO Take 1 tablet by mouth daily.     dexlansoprazole (DEXILANT) 60 MG capsule Take 1 capsule (60 mg total) by mouth every morning 1/2 to 1 hour before morning meal. 30 capsule 2   furosemide (LASIX) 20 MG tablet Take ONE-HALF tablet (10 mg total) by mouth daily. 45 tablet 1   gabapentin (NEURONTIN) 300 MG capsule Take 300 mg by mouth at bedtime.     magnesium gluconate (MAGONATE) 500 MG tablet Take 500 mg by mouth daily.     Multiple Vitamins-Minerals (MULTIVITAMIN PO) Take 1 tablet by mouth daily.     nebivolol (BYSTOLIC) 5 MG tablet Take 1 tablet (5 mg total) by mouth daily. 30 tablet 11   NON FORMULARY CPAP     olmesartan (BENICAR) 20 MG tablet Take 1 tablet (20 mg total) by mouth every evening. 90 tablet 3   OXYGEN Inhale 3 L into the lungs at bedtime.     PARoxetine (PAXIL) 40 MG tablet Take 1 tablet (40 mg total) by mouth daily. 30 tablet 5   traZODone (DESYREL) 150 MG tablet Take 150 mg by mouth at bedtime.     vitamin B-12 (CYANOCOBALAMIN) 1000 MCG tablet Take 1,000 mcg by mouth daily.     acetaminophen (TYLENOL) 650 MG CR tablet Take 1,300 mg by mouth every 8 (eight) hours as needed for pain.      albuterol (VENTOLIN HFA) 108 (90 Base) MCG/ACT inhaler Inhale 1-2 puffs into the lungs every 6 (six) hours as needed for wheezing or shortness of breath.      Alirocumab (PRALUENT) 150 MG/ML SOAJ Inject 1 mL (150 mg total) into the skin every 14 (fourteen) days. 6 mL 3   Alirocumab (  PRALUENT) 150 MG/ML SOAJ Inject 1 mL (150 mg total) into the skin every 14 (fourteen) days. 6 mL 3   cetirizine (ZYRTEC) 10 MG tablet Take 10 mg by mouth daily. (Patient not taking: Reported on  12/01/2023)     clopidogrel (PLAVIX) 75 MG tablet Take 1 tablet (75 mg total) by mouth daily. 90 tablet 3   dexlansoprazole (DEXILANT) 60 MG capsule Take 1 capsule (60 mg total) by mouth in the morning 1/2 to 1 hour before meal. 30 capsule 11   doxycycline (MONODOX) 100 MG capsule Take 1 capsule (100 mg total) by mouth 2 (two) times daily. (Patient not taking: Reported on 01/02/2024) 14 capsule 0   doxycycline (VIBRA-TABS) 100 MG tablet Take 1 tablet (100 mg total) by mouth 2 (two) times daily. 20 tablet 0   ezetimibe (ZETIA) 10 MG tablet Take 1 tablet (10 mg total) by mouth daily. (Patient not taking: Reported on 01/02/2024) 30 tablet 3   furosemide (LASIX) 20 MG tablet Take 1 tablet (20 mg total) by mouth daily. (Patient not taking: Reported on 12/01/2023) 90 tablet 3   gabapentin (NEURONTIN) 300 MG capsule Take 1 capsule (300 mg total) by mouth daily. 90 capsule 3   hyoscyamine (ANASPAZ) 0.125 MG TBDP disintergrating tablet Dissolve 1 tablet on the tongue and allow to dissolve as needed up to three times a day for abdominal pain or cramping 45 tablet 3   nitroGLYCERIN (NITROSTAT) 0.4 MG SL tablet Place 1 tablet (0.4 mg total) under the tongue every 5 (five) minutes as needed for chest pain. (Patient not taking: Reported on 12/01/2023) 30 tablet 2   pantoprazole (PROTONIX) 40 MG tablet Take 1 tablet (40 mg total) by mouth daily. (Patient not taking: Reported on 12/01/2023) 90 tablet 3   PARoxetine (PAXIL) 40 MG tablet Take 1 tablet (40 mg total) by mouth daily. (Patient not taking: Reported on 12/01/2023) 90 tablet 0   potassium chloride (KLOR-CON M10) 10 MEQ tablet Take 1 tablet (10 mEq total) by mouth daily with food (Patient not taking: Reported on 12/01/2023) 90 tablet 3   predniSONE (STERAPRED UNI-PAK 21 TAB) 5 MG (21) TBPK tablet Take as directed on packaging. (Patient not taking: Reported on 12/01/2023) 21 tablet 0   Sodium Sulfate-Mag Sulfate-KCl (SUTAB) [id number] MG TABS Use as directed for  colonoscopy. MANUFACTURER CODES!! BIN: [id number] PCN: CN GROUP: [id number] MEMBER ID: [id number];VHQ AS SECONDARY INSURANCE ;NO PRIOR AUTHORIZATION 24 tablet 0   tirzepatide (MOUNJARO) 15 MG/0.5ML Pen Inject 15 mg into the skin once a week. 2 mL 3   tirzepatide (MOUNJARO) 15 MG/0.5ML Pen Inject 15 mg into the skin once a week. 2 mL 5   traZODone (DESYREL) 150 MG tablet Take 1 tablet (150 mg total) by mouth at bedtime. (Patient not taking: Reported on 12/01/2023) 90 tablet 0   Current Facility-Administered Medications  Medication Dose Route Frequency Provider Last Rate Last Admin   0.9 %  sodium chloride infusion  500 mL Intravenous Once Napoleon Form, MD        Allergies as of 01/02/2024 - Review Complete 01/02/2024  Allergen Reaction Noted   Amlodipine Swelling and Other (See Comments) 02/09/2014   Fenofibrate Other (See Comments) 02/09/2014   Omeprazole Other (See Comments) 02/09/2014   Simvastatin Other (See Comments) 02/09/2014   Singulair [montelukast sodium] Other (See Comments) 02/09/2014   Statins Other (See Comments) 01/12/2014    Family History  Problem Relation Age of Onset   Heart failure Father  Possibly related to EtOH   Alcohol abuse Father    Emphysema Father    Emphysema Mother    Heart failure Mother        Possibly related to EtOH   Diabetes Mother    Alcohol abuse Mother    Valvular heart disease Brother    Bipolar disorder Brother    Crohn's disease Daughter    Thyroid disease Daughter    Colon cancer Neg Hx    Stomach cancer Neg Hx    Esophageal cancer Neg Hx    Rectal cancer Neg Hx    Liver cancer Neg Hx    Stroke Neg Hx     Social History   Socioeconomic History   Marital status: Married    Spouse name: Not on file   Number of children: 2   Years of education: Not on file   Highest education level: Not on file  Occupational History   Occupation: Nurse, learning disability  Tobacco Use   Smoking status: Former    Current packs/day: 0.00     Average packs/day: 1 pack/day for 20.0 years (20.0 ttl pk-yrs)    Types: Cigarettes    Start date: 09/24/1959    Quit date: 09/24/1979    Years since quitting: 44.3   Smokeless tobacco: Never  Vaping Use   Vaping status: Never Used  Substance and Sexual Activity   Alcohol use: Not Currently    Alcohol/week: 1.0 Spencer drink of alcohol    Types: 1 Spencer drinks or equivalent per week   Drug use: No   Sexual activity: Yes    Partners: Male    Birth control/protection: Surgical    Comment: R-TLH/BSO  Other Topics Concern   Not on file  Social History Narrative   Husband with heart transplant.  Lives with husband and son. Husband is older and has dementia.   Social Drivers of Corporate investment banker Strain: Not on file  Food Insecurity: Low Risk  (11/25/2023)   Received from [facility]   Hunger Vital Sign    Worried About Running Out of Food in the Last Year: Never true    Ran Out of Food in the Last Year: Never true  Transportation Needs: No Transportation Needs (11/25/2023)   Received from [facility]    In the past 12 months, has lack of reliable transportation kept you from medical appointments, meetings, work or from getting things needed for daily living? : No  Physical Activity: Not on file  Stress: Not on file  Social Connections: Not on file  Intimate Partner Violence: Not At Risk (07/29/2022)   Humiliation, Afraid, Rape, and Kick questionnaire    Fear of Current or Ex-Partner: No    Emotionally Abused: No    Physically Abused: No    Sexually Abused: No    Review of Systems:  All other review of systems negative except as mentioned in the HPI.  Physical Exam: Vital signs in last 24 hours: BP (!) 150/72   Pulse 62   Temp 98.1 F (36.7 C) (Skin)   Ht 5\' 2"  (1.575 m)   Wt 147 lb (66.7 kg)   LMP  (LMP Unknown)   SpO2 97%   BMI 26.89 kg/m  General:   Alert, NAD Lungs:  Clear .   Heart:  Regular rate and rhythm Abdomen:  Soft,  nontender and nondistended. Neuro/Psych:  Alert and cooperative. Normal mood and affect. A and O x 3  Reviewed labs, radiology  imaging, old records and pertinent past GI work up  Patient is appropriate for planned procedure(s) and anesthesia in an ambulatory setting   K. Scherry Ran , MD [phone]

## 2024-01-03 ENCOUNTER — Other Ambulatory Visit (HOSPITAL_BASED_OUTPATIENT_CLINIC_OR_DEPARTMENT_OTHER): Payer: Self-pay

## 2024-01-03 MED ORDER — AMLODIPINE BESYLATE 5 MG PO TABS
ORAL_TABLET | ORAL | 1 refills | Status: AC
  Filled 2024-01-03: qty 90, 90d supply, fill #0

## 2024-01-05 ENCOUNTER — Other Ambulatory Visit (HOSPITAL_COMMUNITY): Payer: Self-pay

## 2024-01-06 ENCOUNTER — Telehealth: Payer: Self-pay | Admitting: *Deleted

## 2024-01-06 NOTE — Telephone Encounter (Signed)
 Attempted to call patient for post procedure follow-up call. No answer, left voicemail.

## 2024-01-07 LAB — SURGICAL PATHOLOGY

## 2024-01-09 ENCOUNTER — Encounter: Payer: Self-pay | Admitting: Gastroenterology

## 2024-01-13 ENCOUNTER — Telehealth: Payer: Self-pay

## 2024-01-13 NOTE — Telephone Encounter (Signed)
   Pre-operative Risk Assessment    Patient Name: Shelley Spencer  DOB: 1946-05-18 MRN: [id number]   Date of last office visit: 09/09/23 Olinda Bertrand, DO Date of next office visit: NONE   Request for Surgical Clearance    Procedure:   BILATERAL DIRECT BROW PTOSIS REPAIR  Date of Surgery:  Clearance 01/28/24                                Surgeon:  DR Charlette Console Surgeon's Group or Practice Name:  [facility] Phone number:  [phone] Fax number:  [phone]   Type of Clearance Requested:   - Medical  - Pharmacy:  Hold Clopidogrel  (Plavix )     Type of Anesthesia:  MAC   Additional requests/questions:    Signed, Collin Deal   01/13/2024, 2:46 PM

## 2024-01-13 NOTE — Telephone Encounter (Signed)
   Patient Name: Shelley Spencer  DOB: [birth year] MRN: [id number]  Primary Cardiologist: Olinda Bertrand, DO  Chart reviewed as part of pre-operative protocol coverage.  Patient was seen virtually on 12/16/2023 and was doing well.  She was cleared for colonoscopy/endoscopy at the time.  Therefore, given past medical history and time since last visit, based on ACC/AHA guidelines, ELANE PEABODY is at acceptable risk for the planned procedure without further cardiovascular testing.   Per office protocol, she may hold Plavix  for 5 days prior to procedure.  Please resume Plavix  as soon as possible postprocedure, the discretion of the surgeon.  I will route this recommendation to the requesting party via Epic fax function and remove from pre-op pool.  Please call with questions.  Jude Norton, NP 01/13/2024, 3:50 PM

## 2024-01-15 ENCOUNTER — Other Ambulatory Visit (HOSPITAL_BASED_OUTPATIENT_CLINIC_OR_DEPARTMENT_OTHER): Payer: Self-pay

## 2024-01-15 MED ORDER — PRALUENT 150 MG/ML ~~LOC~~ SOAJ
150.0000 mg | SUBCUTANEOUS | 3 refills | Status: DC
Start: 2024-01-15 — End: 2024-01-29
  Filled 2024-01-15: qty 6, 84d supply, fill #0

## 2024-01-20 ENCOUNTER — Other Ambulatory Visit (HOSPITAL_BASED_OUTPATIENT_CLINIC_OR_DEPARTMENT_OTHER): Payer: Self-pay

## 2024-01-21 ENCOUNTER — Other Ambulatory Visit (HOSPITAL_BASED_OUTPATIENT_CLINIC_OR_DEPARTMENT_OTHER): Payer: Self-pay

## 2024-01-21 MED ORDER — NEOMYCIN-POLYMYXIN-DEXAMETH 3.5-10000-0.1 OP OINT
1.0000 | TOPICAL_OINTMENT | Freq: Two times a day (BID) | OPHTHALMIC | 0 refills | Status: AC
Start: 2024-01-21 — End: ?
  Filled 2024-01-21: qty 3.5, 90d supply, fill #0

## 2024-01-23 ENCOUNTER — Other Ambulatory Visit (HOSPITAL_BASED_OUTPATIENT_CLINIC_OR_DEPARTMENT_OTHER): Payer: Self-pay

## 2024-01-29 ENCOUNTER — Other Ambulatory Visit (HOSPITAL_BASED_OUTPATIENT_CLINIC_OR_DEPARTMENT_OTHER): Payer: Self-pay

## 2024-01-29 ENCOUNTER — Other Ambulatory Visit (HOSPITAL_COMMUNITY): Payer: Self-pay

## 2024-01-29 ENCOUNTER — Encounter: Payer: Self-pay | Admitting: Cardiology

## 2024-01-29 ENCOUNTER — Telehealth: Payer: Self-pay | Admitting: Pharmacy Technician

## 2024-01-29 ENCOUNTER — Encounter: Payer: Self-pay | Admitting: Family Medicine

## 2024-01-29 DIAGNOSIS — I2510 Atherosclerotic heart disease of native coronary artery without angina pectoris: Secondary | ICD-10-CM

## 2024-01-29 DIAGNOSIS — E7800 Pure hypercholesterolemia, unspecified: Secondary | ICD-10-CM

## 2024-01-29 MED ORDER — REPATHA SURECLICK 140 MG/ML ~~LOC~~ SOAJ
1.0000 mL | SUBCUTANEOUS | 1 refills | Status: DC
  Filled 2024-01-29 (×2): qty 6, 84d supply, fill #0
  Filled 2024-02-13 – 2024-04-20 (×2): qty 6, 84d supply, fill #1

## 2024-01-29 MED ORDER — FUROSEMIDE 40 MG PO TABS
40.0000 mg | ORAL_TABLET | Freq: Every day | ORAL | 3 refills | Status: AC
  Filled 2024-01-29 – 2024-01-30 (×2): qty 90, 90d supply, fill #0
  Filled 2024-02-13 – 2024-08-26 (×2): qty 90, 90d supply, fill #1

## 2024-01-29 MED ORDER — POTASSIUM CHLORIDE CRYS ER 20 MEQ PO TBCR
20.0000 meq | EXTENDED_RELEASE_TABLET | Freq: Every day | ORAL | 2 refills | Status: AC
Start: 2024-01-29 — End: ?
  Filled 2024-01-29 – 2024-01-30 (×2): qty 90, 90d supply, fill #0
  Filled 2024-02-13 – 2024-07-05 (×2): qty 90, 90d supply, fill #1

## 2024-01-29 NOTE — Telephone Encounter (Signed)
 Sent message to [employer] to see if they can accept an order for portable oxygen  or if something else is needed. Waiting on response.

## 2024-01-29 NOTE — Telephone Encounter (Signed)
   The original pa was done by her pcp.   I ran a test claim for repatha  and it came back cost of 13.00. she was on repatha  in the past and then stopped due to insurance no longer covering it. Sent message to see if she can go back on repatha . Insurance will send denial. Pt aware

## 2024-01-30 ENCOUNTER — Other Ambulatory Visit: Payer: Self-pay

## 2024-01-30 ENCOUNTER — Other Ambulatory Visit (HOSPITAL_BASED_OUTPATIENT_CLINIC_OR_DEPARTMENT_OTHER): Payer: Self-pay

## 2024-01-30 ENCOUNTER — Other Ambulatory Visit (HOSPITAL_COMMUNITY): Payer: Self-pay

## 2024-02-03 ENCOUNTER — Other Ambulatory Visit (HOSPITAL_BASED_OUTPATIENT_CLINIC_OR_DEPARTMENT_OTHER): Payer: Self-pay

## 2024-02-03 MED ORDER — ALBUTEROL SULFATE HFA 108 (90 BASE) MCG/ACT IN AERS
2.0000 | INHALATION_SPRAY | Freq: Four times a day (QID) | RESPIRATORY_TRACT | 6 refills | Status: AC | PRN
  Filled 2024-02-03: qty 8.5, 25d supply, fill #0
  Filled 2024-07-05: qty 6.7, 25d supply, fill #1

## 2024-02-05 ENCOUNTER — Ambulatory Visit: Payer: Self-pay | Admitting: Gastroenterology

## 2024-02-13 ENCOUNTER — Other Ambulatory Visit: Payer: Self-pay

## 2024-02-13 ENCOUNTER — Other Ambulatory Visit (HOSPITAL_BASED_OUTPATIENT_CLINIC_OR_DEPARTMENT_OTHER): Payer: Self-pay

## 2024-02-17 ENCOUNTER — Other Ambulatory Visit (HOSPITAL_BASED_OUTPATIENT_CLINIC_OR_DEPARTMENT_OTHER): Payer: Self-pay

## 2024-02-17 MED ORDER — OLMESARTAN MEDOXOMIL 40 MG PO TABS
40.0000 mg | ORAL_TABLET | Freq: Every evening | ORAL | 3 refills | Status: AC
  Filled 2024-02-17: qty 90, 90d supply, fill #0
  Filled 2024-07-05: qty 90, 90d supply, fill #1

## 2024-02-20 ENCOUNTER — Other Ambulatory Visit (HOSPITAL_COMMUNITY): Payer: Self-pay

## 2024-02-20 ENCOUNTER — Other Ambulatory Visit (HOSPITAL_BASED_OUTPATIENT_CLINIC_OR_DEPARTMENT_OTHER): Payer: Self-pay

## 2024-02-20 MED ORDER — PAROXETINE HCL 40 MG PO TABS
40.0000 mg | ORAL_TABLET | Freq: Every morning | ORAL | 3 refills | Status: AC
  Filled 2024-04-24 – 2024-07-05 (×2): qty 90, 90d supply, fill #0

## 2024-02-20 MED ORDER — AMLODIPINE BESYLATE 2.5 MG PO TABS
2.5000 mg | ORAL_TABLET | Freq: Every day | ORAL | 3 refills | Status: AC
  Filled 2024-02-20: qty 90, 90d supply, fill #0

## 2024-02-20 MED ORDER — NEBIVOLOL HCL 5 MG PO TABS
5.0000 mg | ORAL_TABLET | Freq: Every day | ORAL | 3 refills | Status: AC
  Filled 2024-07-05: qty 90, 90d supply, fill #0

## 2024-02-20 MED ORDER — TRAZODONE HCL 150 MG PO TABS
150.0000 mg | ORAL_TABLET | Freq: Every day | ORAL | 3 refills | Status: AC
  Filled 2024-02-20: qty 90, 90d supply, fill #0
  Filled 2024-06-01 (×2): qty 90, 90d supply, fill #1
  Filled 2024-08-26: qty 90, 90d supply, fill #2

## 2024-03-04 ENCOUNTER — Other Ambulatory Visit (HOSPITAL_BASED_OUTPATIENT_CLINIC_OR_DEPARTMENT_OTHER): Payer: Self-pay

## 2024-03-04 MED ORDER — PAROXETINE HCL 40 MG PO TABS
40.0000 mg | ORAL_TABLET | Freq: Every day | ORAL | 1 refills | Status: DC
  Filled 2024-03-04 – 2024-03-16 (×2): qty 90, 90d supply, fill #0
  Filled 2024-06-01 – 2024-06-14 (×2): qty 90, 90d supply, fill #1

## 2024-03-15 ENCOUNTER — Other Ambulatory Visit (HOSPITAL_BASED_OUTPATIENT_CLINIC_OR_DEPARTMENT_OTHER): Payer: Self-pay

## 2024-03-15 ENCOUNTER — Other Ambulatory Visit: Payer: Self-pay

## 2024-03-15 MED ORDER — NEBIVOLOL HCL 5 MG PO TABS
5.0000 mg | ORAL_TABLET | Freq: Every day | ORAL | 3 refills | Status: AC
  Filled 2024-03-15: qty 90, 90d supply, fill #0
  Filled 2024-06-22: qty 90, 90d supply, fill #1
  Filled 2024-08-26 – 2024-09-13 (×2): qty 90, 90d supply, fill #2

## 2024-03-15 MED ORDER — MOUNJARO 15 MG/0.5ML ~~LOC~~ SOAJ
15.0000 mg | SUBCUTANEOUS | 5 refills | Status: AC
  Filled 2024-03-15: qty 2, 28d supply, fill #0
  Filled 2024-04-20: qty 2, 28d supply, fill #1
  Filled 2024-05-24: qty 2, 28d supply, fill #2
  Filled 2024-06-20: qty 2, 28d supply, fill #3
  Filled 2024-07-22: qty 2, 28d supply, fill #4
  Filled 2024-09-20: qty 2, 28d supply, fill #5

## 2024-03-16 ENCOUNTER — Other Ambulatory Visit (HOSPITAL_BASED_OUTPATIENT_CLINIC_OR_DEPARTMENT_OTHER): Payer: Self-pay

## 2024-03-18 ENCOUNTER — Other Ambulatory Visit (HOSPITAL_BASED_OUTPATIENT_CLINIC_OR_DEPARTMENT_OTHER): Payer: Self-pay

## 2024-03-18 MED ORDER — DEXLANSOPRAZOLE 60 MG PO CPDR
60.0000 mg | DELAYED_RELEASE_CAPSULE | Freq: Every day | ORAL | 3 refills | Status: AC
  Filled 2024-03-18: qty 90, 90d supply, fill #0
  Filled 2024-07-05 – 2024-07-09 (×2): qty 90, 90d supply, fill #1
  Filled 2024-10-07: qty 90, 90d supply, fill #2

## 2024-03-22 ENCOUNTER — Other Ambulatory Visit (HOSPITAL_BASED_OUTPATIENT_CLINIC_OR_DEPARTMENT_OTHER): Payer: Self-pay

## 2024-04-21 ENCOUNTER — Other Ambulatory Visit (HOSPITAL_BASED_OUTPATIENT_CLINIC_OR_DEPARTMENT_OTHER): Payer: Self-pay

## 2024-04-21 ENCOUNTER — Other Ambulatory Visit: Payer: Self-pay

## 2024-04-22 ENCOUNTER — Other Ambulatory Visit (HOSPITAL_BASED_OUTPATIENT_CLINIC_OR_DEPARTMENT_OTHER): Payer: Self-pay

## 2024-04-24 ENCOUNTER — Other Ambulatory Visit (HOSPITAL_BASED_OUTPATIENT_CLINIC_OR_DEPARTMENT_OTHER): Payer: Self-pay

## 2024-04-26 ENCOUNTER — Other Ambulatory Visit (HOSPITAL_BASED_OUTPATIENT_CLINIC_OR_DEPARTMENT_OTHER): Payer: Self-pay

## 2024-04-26 ENCOUNTER — Other Ambulatory Visit: Payer: Self-pay

## 2024-05-04 ENCOUNTER — Other Ambulatory Visit (HOSPITAL_BASED_OUTPATIENT_CLINIC_OR_DEPARTMENT_OTHER): Payer: Self-pay

## 2024-05-25 ENCOUNTER — Other Ambulatory Visit (HOSPITAL_BASED_OUTPATIENT_CLINIC_OR_DEPARTMENT_OTHER): Payer: Self-pay

## 2024-05-25 MED ORDER — SCOPOLAMINE 1 MG/3DAYS TD PT72
1.0000 | MEDICATED_PATCH | TRANSDERMAL | 1 refills | Status: AC
  Filled 2024-05-25: qty 4, 12d supply, fill #0

## 2024-05-26 ENCOUNTER — Other Ambulatory Visit (HOSPITAL_BASED_OUTPATIENT_CLINIC_OR_DEPARTMENT_OTHER): Payer: Self-pay

## 2024-05-26 MED ORDER — FLUZONE HIGH-DOSE 0.5 ML IM SUSY
0.5000 mL | PREFILLED_SYRINGE | Freq: Once | INTRAMUSCULAR | 0 refills | Status: AC
Start: 2024-05-26 — End: 2024-05-27
  Filled 2024-05-26: qty 0.5, 1d supply, fill #0

## 2024-05-28 ENCOUNTER — Other Ambulatory Visit (HOSPITAL_BASED_OUTPATIENT_CLINIC_OR_DEPARTMENT_OTHER): Payer: Self-pay

## 2024-05-28 MED ORDER — PREDNISONE 5 MG PO TABS
ORAL_TABLET | ORAL | 0 refills | Status: AC
  Filled 2024-05-28: qty 21, 6d supply, fill #0

## 2024-06-01 ENCOUNTER — Other Ambulatory Visit (HOSPITAL_BASED_OUTPATIENT_CLINIC_OR_DEPARTMENT_OTHER): Payer: Self-pay

## 2024-06-16 ENCOUNTER — Other Ambulatory Visit (HOSPITAL_BASED_OUTPATIENT_CLINIC_OR_DEPARTMENT_OTHER): Payer: Self-pay

## 2024-06-22 ENCOUNTER — Other Ambulatory Visit (HOSPITAL_BASED_OUTPATIENT_CLINIC_OR_DEPARTMENT_OTHER): Payer: Self-pay

## 2024-07-06 ENCOUNTER — Other Ambulatory Visit (HOSPITAL_BASED_OUTPATIENT_CLINIC_OR_DEPARTMENT_OTHER): Payer: Self-pay

## 2024-07-06 ENCOUNTER — Other Ambulatory Visit: Payer: Self-pay

## 2024-07-08 ENCOUNTER — Other Ambulatory Visit (HOSPITAL_BASED_OUTPATIENT_CLINIC_OR_DEPARTMENT_OTHER): Payer: Self-pay

## 2024-07-08 ENCOUNTER — Other Ambulatory Visit: Payer: Self-pay

## 2024-07-08 ENCOUNTER — Emergency Department (HOSPITAL_BASED_OUTPATIENT_CLINIC_OR_DEPARTMENT_OTHER): Admitting: Radiology

## 2024-07-08 ENCOUNTER — Emergency Department (HOSPITAL_BASED_OUTPATIENT_CLINIC_OR_DEPARTMENT_OTHER)
Admission: EM | Admit: 2024-07-08 | Discharge: 2024-07-09 | Disposition: A | Discharge status: Home / Self Care | Attending: Emergency Medicine | Admitting: Emergency Medicine

## 2024-07-08 DIAGNOSIS — R0789 Other chest pain: Secondary | ICD-10-CM

## 2024-07-08 DIAGNOSIS — Z7901 Long term (current) use of anticoagulants: Secondary | ICD-10-CM | POA: Diagnosis not present

## 2024-07-08 DIAGNOSIS — Z79899 Other long term (current) drug therapy: Secondary | ICD-10-CM | POA: Diagnosis not present

## 2024-07-08 DIAGNOSIS — I10 Essential (primary) hypertension: Secondary | ICD-10-CM | POA: Diagnosis not present

## 2024-07-08 DIAGNOSIS — I2510 Atherosclerotic heart disease of native coronary artery without angina pectoris: Secondary | ICD-10-CM | POA: Diagnosis not present

## 2024-07-08 LAB — TROPONIN T, HIGH SENSITIVITY
Troponin T High Sensitivity: <15 ng/L (ref 0–19)
Troponin T High Sensitivity: <15 ng/L (ref 0–19)

## 2024-07-08 LAB — CBC
HCT: 44.7 % (ref 36.0–46.0)
Hemoglobin: 14.5 g/dL (ref 12.0–15.0)
MCH: 26.7 pg (ref 26.0–34.0)
MCHC: 32.4 g/dL (ref 30.0–36.0)
MCV: 82.2 fL (ref 80.0–100.0)
Platelets: 332 K/uL (ref 150–400)
RBC: 5.44 MIL/uL — ABNORMAL HIGH (ref 3.87–5.11)
RDW: 14.4 % (ref 11.5–15.5)
WBC: 8.3 K/uL (ref 4.0–10.5)
nRBC: 0 % (ref 0.0–0.2)

## 2024-07-08 LAB — BASIC METABOLIC PANEL WITH GFR
Anion gap: 11 (ref 5–15)
BUN: 14 mg/dL (ref 8–23)
CO2: 27 mmol/L (ref 22–32)
Calcium: 9.8 mg/dL (ref 8.9–10.3)
Chloride: 103 mmol/L (ref 98–111)
Creatinine, Ser: 1.22 mg/dL — ABNORMAL HIGH (ref 0.44–1.00)
GFR, Estimated: 46 mL/min — ABNORMAL LOW (ref >60)
Glucose, Bld: 95 mg/dL (ref 70–99)
Potassium: 3.8 mmol/L (ref 3.5–5.1)
Sodium: 141 mmol/L (ref 135–145)

## 2024-07-08 MED ORDER — TRAZODONE HCL 150 MG PO TABS: 150.0000 mg | ORAL_TABLET | Freq: Every day | ORAL | 1 refills | Status: AC

## 2024-07-08 MED ORDER — PAROXETINE HCL 40 MG PO TABS: 40.0000 mg | ORAL_TABLET | Freq: Every day | ORAL | 1 refills | Status: AC

## 2024-07-08 MED ORDER — AMLODIPINE BESYLATE 5 MG PO TABS
5.0000 mg | ORAL_TABLET | Freq: Once | ORAL | Status: AC
  Administered 2024-07-08: 5 mg via ORAL
  Filled 2024-07-08: qty 1

## 2024-07-08 NOTE — ED Triage Notes (Signed)
 Patient reports generalized chest pian for the past week that worsened today. States hx of GERD but pain is unrelieved by her usual medication.

## 2024-07-08 NOTE — ED Provider Notes (Signed)
 [place] EMERGENCY DEPARTMENT AT [facility] Provider Note   CSN: [id number] Arrival date & time: 07/08/24  1800     Patient presents with: Chest Pain   Shelley Spencer is a 78 y.o. female.   HPI     This is a 78 year old female with a history of hypertension who presents with chest pain.  Reports she has been having intermittent chest pain over the last week.  She describes it as pressure.  It is not radiating.  It is not associated with exertion.  She thinks it may be related to reflux but has not had any specific symptoms related to food or improvement with her reflux medications.  She is currently pain-free.  No shortness of breath or diaphoresis.  Appears patient had a coronary CT in 2020 which was reassuring.  I do not see any stress testing.  She has fairly significant reflux and is on Dexilant .  Prior to Admission medications   Medication Sig Start Date End Date Taking? Authorizing Provider  acetaminophen  (TYLENOL ) 650 MG CR tablet Take 1,300 mg by mouth every 8 (eight) hours as needed for pain.     [provider]  albuterol  (VENTOLIN  HFA) 108 (90 Base) MCG/ACT inhaler Inhale 1-2 puffs into the lungs every 6 (six) hours as needed for wheezing or shortness of breath.     [provider]  albuterol  (VENTOLIN  HFA) 108 (90 Base) MCG/ACT inhaler Inhale 2 puffs into the lungs every 6 (six) hours as needed for wheezing, cough, or shortness of breath. 02/03/24     ALPRAZolam  (XANAX ) 0.25 MG tablet Take 1 tablet (0.25 mg total) by mouth 3 (three) times daily as needed for anxiety. 08/20/23     amLODipine  (NORVASC ) 2.5 MG tablet Take 1 tablet (2.5 mg total) by mouth daily. 02/20/24     amLODipine  (NORVASC ) 5 MG tablet Take 1 tablet (5mg ) by mouth once a day. 01/03/24     budesonide-formoterol  (SYMBICORT) 160-4.5 MCG/ACT inhaler Inhale 2 puffs into the lungs daily as needed (shortness of breath).    [provider]  CALCIUM-VITAMIN D  PO Take 1 tablet by  mouth daily.    [provider]  cetirizine (ZYRTEC) 10 MG tablet Take 10 mg by mouth daily. Patient not taking: Reported on 12/01/2023    [provider]  clopidogrel  (PLAVIX ) 75 MG tablet Take 1 tablet (75 mg total) by mouth daily. 11/18/23   Tolia, Sunit, DO  dexlansoprazole  (DEXILANT ) 60 MG capsule Take 1 capsule (60 mg total) by mouth in the morning 1/2 to 1 hour before meal. 11/14/23     dexlansoprazole  (DEXILANT ) 60 MG capsule Take 1 capsule (60 mg total) by mouth 30 minutes to 1 hour before morning meal. 03/18/24     Evolocumab  (REPATHA  SURECLICK) 140 MG/ML SOAJ Inject 140 mg into the skin every 14 (fourteen) days. 01/29/24   Tolia, Sunit, DO  furosemide  (LASIX ) 20 MG tablet Take ONE-HALF tablet (10 mg total) by mouth daily. 10/09/23     furosemide  (LASIX ) 20 MG tablet Take 1 tablet (20 mg total) by mouth daily. Patient not taking: Reported on 12/01/2023 11/20/23     furosemide  (LASIX ) 40 MG tablet Take 1 tablet (40 mg total) by mouth daily. 01/29/24     gabapentin  (NEURONTIN ) 300 MG capsule Take 300 mg by mouth at bedtime. 03/02/21   [provider]  gabapentin  (NEURONTIN ) 300 MG capsule Take 1 capsule (300 mg total) by mouth daily. 10/30/23     hyoscyamine  (ANASPAZ ) 0.125 MG  TBDP disintergrating tablet Dissolve 1 tablet on the tongue and allow to dissolve as needed up to three times a day for abdominal pain or cramping 11/05/23     magnesium gluconate (MAGONATE) 500 MG tablet Take 500 mg by mouth daily.    [provider]  Multiple Vitamins-Minerals (MULTIVITAMIN PO) Take 1 tablet by mouth daily.    [provider]  nebivolol  (BYSTOLIC ) 5 MG tablet Take 1 tablet (5 mg total) by mouth daily. 12/23/23     nebivolol  (BYSTOLIC ) 5 MG tablet Take 1 tablet (5 mg total) by mouth daily. 02/20/24     nebivolol  (BYSTOLIC ) 5 MG tablet Take 1 tablet (5 mg total) by mouth daily. 03/15/24     neomycin -polymyxin b-dexamethasone  (MAXITROL ) 3.5-10000-0.1 OINT Apply a small amount  onto sutures or operative site twice a day for three days then stop. 01/21/24     nitroGLYCERIN  (NITROSTAT ) 0.4 MG SL tablet Place 1 tablet (0.4 mg total) under the tongue every 5 (five) minutes as needed for chest pain. Patient not taking: Reported on 12/01/2023 08/14/20   Elmira Newman PARAS, MD  NON FORMULARY CPAP    [provider]  olmesartan  (BENICAR ) 40 MG tablet Take 1 tablet (40 mg total) by mouth every evening. 02/17/24     OXYGEN  Inhale 3 L into the lungs at bedtime.    [provider]  pantoprazole  (PROTONIX ) 40 MG tablet Take 1 tablet (40 mg total) by mouth daily. Patient not taking: Reported on 12/01/2023 04/18/21   Beather Delon Gibson, PA  PARoxetine  (PAXIL ) 40 MG tablet Take 1 tablet (40 mg total) by mouth daily. 08/20/23     PARoxetine  (PAXIL ) 40 MG tablet Take 1 tablet (40 mg total) by mouth every morning. 02/20/24     PARoxetine  (PAXIL ) 40 MG tablet Take 1 tablet (40 mg total) by mouth daily. 03/04/24     PARoxetine  (PAXIL ) 40 MG tablet Take 1 tablet (40 mg total) by mouth daily. 07/08/24     potassium chloride  (KLOR-CON  M10) 10 MEQ tablet Take 1 tablet (10 mEq total) by mouth daily with food Patient not taking: Reported on 12/01/2023 11/21/23     potassium chloride  SA (KLOR-CON  M20) 20 MEQ tablet Take 1 tablet (20 mEq total) by mouth daily with food 01/29/24     predniSONE  (DELTASONE ) 5 MG tablet Take as directed on pack. 05/28/24     scopolamine  (TRANSDERM-SCOP) 1 MG/3DAYS Place 1 patch (1 mg total) onto the skin (behind the ear) every 3 (three) days. Apply 30 minutes prior to getting on boat. 05/25/24     tirzepatide  (MOUNJARO ) 15 MG/0.5ML Pen Inject 15 mg into the skin once a week. 12/23/23     tirzepatide  (MOUNJARO ) 15 MG/0.5ML Pen Inject 15 mg into the skin once a week. 12/23/23     tirzepatide  (MOUNJARO ) 15 MG/0.5ML Pen Inject 15 mg into the skin once a week. 03/15/24     traZODone  (DESYREL ) 150 MG tablet Take 150 mg by mouth at bedtime.    [provider]   traZODone  (DESYREL ) 150 MG tablet Take 1 tablet (150 mg total) by mouth at bedtime. 02/20/24     traZODone  (DESYREL ) 150 MG tablet Take 1 tablet (150 mg total) by mouth at bedtime. 07/08/24     vitamin B-12 (CYANOCOBALAMIN ) 1000 MCG tablet Take 1,000 mcg by mouth daily.    [provider]  potassium chloride  (KLOR-CON ) 10 MEQ tablet Take 1 tablet (10 mEq total) by mouth daily with food 10/14/22 11/21/23  Allergies: Amlodipine , Fenofibrate, Omeprazole, Simvastatin , Singulair [montelukast sodium], and Statins    Review of Systems  Constitutional:  Negative for fever.  Respiratory:  Negative for shortness of breath.   Cardiovascular:  Positive for chest pain. Negative for leg swelling.  Gastrointestinal:  Negative for abdominal pain.  All other systems reviewed and are negative.   Updated Vital Signs BP (!) 157/93   Pulse 73   Temp 98.1 F (36.7 C)   Resp 16   LMP  (LMP Unknown)   SpO2 93%   Physical Exam Vitals and nursing note reviewed.  Constitutional:      Appearance: She is well-developed. She is not ill-appearing.  HENT:     Head: Normocephalic and atraumatic.  Eyes:     Pupils: Pupils are equal, round, and reactive to light.  Cardiovascular:     Rate and Rhythm: Normal rate and regular rhythm.     Heart sounds: Normal heart sounds.  Pulmonary:     Effort: Pulmonary effort is normal. No respiratory distress.     Breath sounds: No wheezing.  Abdominal:     General: Bowel sounds are normal.     Palpations: Abdomen is soft.  Musculoskeletal:     Cervical back: Neck supple.  Skin:    General: Skin is warm and dry.  Neurological:     General: No focal deficit present.     Mental Status: She is alert and oriented to person, place, and time.     (all labs ordered are listed, but only abnormal results are displayed) Labs Reviewed  BASIC METABOLIC PANEL WITH GFR - Abnormal; Notable for the following components:      Result Value   Creatinine, Ser 1.22 (*)     GFR, Estimated 46 (*)    All other components within normal limits  CBC - Abnormal; Notable for the following components:   RBC 5.44 (*)    All other components within normal limits  TROPONIN T, HIGH SENSITIVITY  TROPONIN T, HIGH SENSITIVITY    EKG: EKG Interpretation Date/Time:  Thursday July 08 2024 18:07:26 EDT Ventricular Rate:  86 PR Interval:  156 QRS Duration:  70 QT Interval:  366 QTC Calculation: 437 R Axis:   -4  Text Interpretation: Normal sinus rhythm Anterior infarct , age undetermined Abnormal ECG When compared with ECG of 09-Sep-2023 11:12, Anterior infarct is now Present Nonspecific T wave abnormality, worse in Lateral leads Confirmed by Bari Pfeiffer ([id number]) on 07/08/2024 10:58:27 PM  Radiology: [facility] Chest 2 View Result Date: 07/08/2024 CLINICAL DATA:  Worsening chest pain x1 week. EXAM: CHEST - 2 VIEW COMPARISON:  August 13, 2020 FINDINGS: The heart size and mediastinal contours are within normal limits. No acute infiltrate, pleural effusion or pneumothorax is identified. Postoperative changes are seen within the cervical spine. No acute osseous abnormalities are identified. IMPRESSION: No active cardiopulmonary disease. Electronically Signed   By: Suzen Dials M.D.   On: 07/08/2024 18:44     Procedures   Medications Ordered in the ED  amLODipine  (NORVASC ) tablet 5 mg (5 mg Oral Given 07/08/24 2342)                                    Medical Decision Making Amount and/or Complexity of Data Reviewed Labs: ordered. Radiology: ordered.  Risk Prescription drug management.   This patient presents to the ED for concern of chest pain, this involves an extensive number of  treatment options, and is a complaint that carries with it a high risk of complications and morbidity.  I considered the following differential and admission for this acute, potentially life threatening condition.  The differential diagnosis includes ACS, PE, pneumothorax,  pneumonia, reflux  MDM:    This is a 78 year old female who presents with 1 week history of chest pain that waxes and wanes.  No exertional symptoms.  Not necessarily associated with food.  Currently pain-free.  He is nontoxic and vital signs notable for hypertension.  She did take her blood pressure medication today.  EKG shows no evidence of acute ischemia or arrhythmia.  Troponin x 2 negative.  Basic labs are reassuring.  Chest x-ray without pneumonia or pneumothorax.  Low suspicion for PE.  I reviewed her chart.  She did have a coronary CT that did not show any significant stenosis in 2020.  She is currently on Plavix .  Recommend that she follow-up closely with cardiology.  Referral placed.  She has remained symptom-free here in the emergency department.  She was given 1 dose of amlodipine  and repeat blood pressure of 157/93.  Discussed that she needs to continue her home blood pressure medications and follow-up for adjustments with her primary physician.  No signs or symptoms of hypertensive urgency or emergency.  (Labs, imaging, consults)  Labs: I Ordered, and personally interpreted labs.  The pertinent results include: CBC, BMP, troponin x 2  Imaging Studies ordered: I ordered imaging studies including chest x-ray I independently visualized and interpreted imaging. I agree with the radiologist interpretation  Additional history obtained from chart review.  External records from outside source obtained and reviewed including cardiology imaging  Cardiac Monitoring: The patient was maintained on a cardiac monitor.  If on the cardiac monitor, I personally viewed and interpreted the cardiac monitored which showed an underlying rhythm of: Sinus  Reevaluation: After the interventions noted above, I reevaluated the patient and found that they have :stayed the same  Social Determinants of Health:  lives independently  Disposition: Discharge  Co morbidities that complicate the patient  evaluation  Past Medical History:  Diagnosis Date   Anemia    Anxiety    Arthritis    Asthma    Bicornuate uterus    double cervix   CAD (coronary artery disease)    Colon polyp    Depression    Deviated septum    DJD (degenerative joint disease)    L-Spine   Fibroids    GERD (gastroesophageal reflux disease)    Hematuria    History of hiatal hernia    HNP (herniated nucleus pulposus)    lower back   Hyperlipidemia    Hypertension    MVP (mitral valve prolapse)    pt denies, pt states she had a Echo ?2008 and it did not show MVP   Osteopenia    Pre-diabetes    Retinal tear 2005   Small right retinal tear   Shingles 1980's   on her waist   Sleep apnea    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     Medicines Meds ordered this encounter  Medications   amLODipine  (NORVASC ) tablet 5 mg    I have reviewed the patients home medicines and have made adjustments as needed  Problem List / ED Course: Problem List Items Addressed This Visit       Cardiovascular and Mediastinum   Hypertension   Relevant Orders   Ambulatory referral to Cardiology     Other   Atypical  chest pain - Primary   Relevant Orders   Ambulatory referral to Cardiology             Final diagnoses:  Atypical chest pain  Hypertension, unspecified type    ED Discharge Orders          Ordered    Ambulatory referral to Cardiology        07/09/24 0010               Bari Charmaine FALCON, MD 07/09/24 [phone]

## 2024-07-09 NOTE — Discharge Instructions (Signed)
 You were seen today for chest pain.  Your workup today is reassuring.  Follow-up with cardiology.  Make sure that you are taking your blood pressure medications.  You may need adjustment in these medications.

## 2024-07-14 ENCOUNTER — Other Ambulatory Visit (HOSPITAL_BASED_OUTPATIENT_CLINIC_OR_DEPARTMENT_OTHER): Payer: Self-pay

## 2024-07-22 ENCOUNTER — Other Ambulatory Visit (HOSPITAL_BASED_OUTPATIENT_CLINIC_OR_DEPARTMENT_OTHER): Payer: Self-pay

## 2024-07-28 ENCOUNTER — Other Ambulatory Visit: Payer: Self-pay

## 2024-07-29 ENCOUNTER — Other Ambulatory Visit (HOSPITAL_BASED_OUTPATIENT_CLINIC_OR_DEPARTMENT_OTHER): Payer: Self-pay

## 2024-08-09 ENCOUNTER — Other Ambulatory Visit (HOSPITAL_BASED_OUTPATIENT_CLINIC_OR_DEPARTMENT_OTHER): Payer: Self-pay

## 2024-08-17 ENCOUNTER — Other Ambulatory Visit (HOSPITAL_BASED_OUTPATIENT_CLINIC_OR_DEPARTMENT_OTHER): Payer: Self-pay

## 2024-08-17 ENCOUNTER — Other Ambulatory Visit: Payer: Self-pay | Admitting: Cardiology

## 2024-08-17 DIAGNOSIS — I2510 Atherosclerotic heart disease of native coronary artery without angina pectoris: Secondary | ICD-10-CM

## 2024-08-17 DIAGNOSIS — E7800 Pure hypercholesterolemia, unspecified: Secondary | ICD-10-CM

## 2024-08-17 MED ORDER — REPATHA SURECLICK 140 MG/ML ~~LOC~~ SOAJ
1.0000 mL | SUBCUTANEOUS | 1 refills | Status: AC
  Filled 2024-08-17: qty 6, 84d supply, fill #0

## 2024-08-27 ENCOUNTER — Other Ambulatory Visit: Payer: Self-pay

## 2024-08-27 ENCOUNTER — Other Ambulatory Visit (HOSPITAL_BASED_OUTPATIENT_CLINIC_OR_DEPARTMENT_OTHER): Payer: Self-pay

## 2024-09-13 ENCOUNTER — Other Ambulatory Visit: Payer: Self-pay

## 2024-09-13 ENCOUNTER — Other Ambulatory Visit (HOSPITAL_BASED_OUTPATIENT_CLINIC_OR_DEPARTMENT_OTHER): Payer: Self-pay

## 2024-09-14 ENCOUNTER — Other Ambulatory Visit (HOSPITAL_BASED_OUTPATIENT_CLINIC_OR_DEPARTMENT_OTHER): Payer: Self-pay

## 2024-09-14 MED ORDER — FUROSEMIDE 40 MG PO TABS
40.0000 mg | ORAL_TABLET | Freq: Every day | ORAL | 3 refills | Status: AC
  Filled 2024-09-14: qty 90, 90d supply, fill #0

## 2024-09-20 ENCOUNTER — Other Ambulatory Visit (HOSPITAL_COMMUNITY): Payer: Self-pay

## 2024-09-20 ENCOUNTER — Other Ambulatory Visit (HOSPITAL_BASED_OUTPATIENT_CLINIC_OR_DEPARTMENT_OTHER): Payer: Self-pay

## 2024-09-20 MED ORDER — DOXYCYCLINE HYCLATE 100 MG PO TABS
100.0000 mg | ORAL_TABLET | Freq: Two times a day (BID) | ORAL | 0 refills | Status: AC
  Filled 2024-09-20: qty 14, 7d supply, fill #0

## 2024-09-20 MED ORDER — PREDNISONE 20 MG PO TABS
20.0000 mg | ORAL_TABLET | Freq: Every day | ORAL | 0 refills | Status: AC
  Filled 2024-09-20: qty 5, 5d supply, fill #0

## 2024-09-20 MED ORDER — BUDESONIDE-FORMOTEROL FUMARATE 160-4.5 MCG/ACT IN AERO
2.0000 | INHALATION_SPRAY | Freq: Two times a day (BID) | RESPIRATORY_TRACT | 3 refills | Status: AC
  Filled 2024-09-20: qty 10.2, 50d supply, fill #0

## 2024-09-22 ENCOUNTER — Other Ambulatory Visit (HOSPITAL_BASED_OUTPATIENT_CLINIC_OR_DEPARTMENT_OTHER): Payer: Self-pay

## 2024-09-22 MED ORDER — PAROXETINE HCL 40 MG PO TABS
40.0000 mg | ORAL_TABLET | Freq: Every day | ORAL | 1 refills | Status: AC
  Filled 2024-09-22: qty 90, 90d supply, fill #0

## 2024-09-30 ENCOUNTER — Other Ambulatory Visit (HOSPITAL_BASED_OUTPATIENT_CLINIC_OR_DEPARTMENT_OTHER): Payer: Self-pay

## 2024-09-30 MED ORDER — OLMESARTAN MEDOXOMIL 40 MG PO TABS
40.0000 mg | ORAL_TABLET | Freq: Every evening | ORAL | 0 refills | Status: AC
  Filled 2024-09-30: qty 90, 90d supply, fill #0

## 2024-09-30 MED ORDER — POTASSIUM CHLORIDE CRYS ER 20 MEQ PO TBCR
20.0000 meq | EXTENDED_RELEASE_TABLET | Freq: Every day | ORAL | 0 refills | Status: AC
  Filled 2024-09-30 (×2): qty 90, 90d supply, fill #0

## 2024-10-07 ENCOUNTER — Other Ambulatory Visit (HOSPITAL_BASED_OUTPATIENT_CLINIC_OR_DEPARTMENT_OTHER): Payer: Self-pay

## 2024-10-12 ENCOUNTER — Other Ambulatory Visit (HOSPITAL_BASED_OUTPATIENT_CLINIC_OR_DEPARTMENT_OTHER): Payer: Self-pay

## 2024-10-19 ENCOUNTER — Other Ambulatory Visit (HOSPITAL_BASED_OUTPATIENT_CLINIC_OR_DEPARTMENT_OTHER): Payer: Self-pay

## 2024-10-19 ENCOUNTER — Telehealth: Payer: Self-pay | Admitting: Cardiology

## 2024-10-19 NOTE — Telephone Encounter (Signed)
 Patient would like to switch from Dr. Michele to Dr. Raford. Are you both in agreement?

## 2024-10-19 NOTE — Telephone Encounter (Signed)
Sure.   ST

## 2024-10-21 ENCOUNTER — Other Ambulatory Visit (HOSPITAL_BASED_OUTPATIENT_CLINIC_OR_DEPARTMENT_OTHER): Payer: Self-pay

## 2024-10-22 ENCOUNTER — Other Ambulatory Visit (HOSPITAL_BASED_OUTPATIENT_CLINIC_OR_DEPARTMENT_OTHER): Payer: Self-pay

## 2024-10-26 ENCOUNTER — Ambulatory Visit (HOSPITAL_COMMUNITY)
Admission: RE | Admit: 2024-10-26 | Discharge: 2024-10-26 | Disposition: A | Source: Ambulatory Visit | Attending: Surgery | Admitting: Surgery

## 2024-10-26 ENCOUNTER — Other Ambulatory Visit (HOSPITAL_COMMUNITY): Payer: Self-pay | Admitting: Internal Medicine

## 2024-10-26 DIAGNOSIS — R0989 Other specified symptoms and signs involving the circulatory and respiratory systems: Secondary | ICD-10-CM

## 2025-01-31 ENCOUNTER — Ambulatory Visit (HOSPITAL_BASED_OUTPATIENT_CLINIC_OR_DEPARTMENT_OTHER): Admitting: Cardiovascular Disease
